# Patient Record
Sex: Female | Born: 1965 | Race: Black or African American | Hispanic: No | Marital: Single | Smoking: Current every day smoker
Health system: Southern US, Community
[De-identification: ages and names within clinical notes are randomized; demographics above are authoritative.]

## PROBLEM LIST (undated history)

## (undated) ENCOUNTER — Emergency Department (HOSPITAL_COMMUNITY): Admission: EM | Payer: Medicare Other | Source: Home / Self Care

## (undated) ENCOUNTER — Inpatient Hospital Stay: Payer: Self-pay

## (undated) DIAGNOSIS — K209 Esophagitis, unspecified without bleeding: Secondary | ICD-10-CM

## (undated) DIAGNOSIS — I499 Cardiac arrhythmia, unspecified: Secondary | ICD-10-CM

## (undated) DIAGNOSIS — K219 Gastro-esophageal reflux disease without esophagitis: Secondary | ICD-10-CM

## (undated) DIAGNOSIS — F17210 Nicotine dependence, cigarettes, uncomplicated: Secondary | ICD-10-CM

## (undated) DIAGNOSIS — K635 Polyp of colon: Secondary | ICD-10-CM

## (undated) DIAGNOSIS — D649 Anemia, unspecified: Secondary | ICD-10-CM

## (undated) DIAGNOSIS — J45909 Unspecified asthma, uncomplicated: Secondary | ICD-10-CM

## (undated) DIAGNOSIS — D1803 Hemangioma of intra-abdominal structures: Secondary | ICD-10-CM

## (undated) DIAGNOSIS — I2510 Atherosclerotic heart disease of native coronary artery without angina pectoris: Secondary | ICD-10-CM

## (undated) DIAGNOSIS — I10 Essential (primary) hypertension: Secondary | ICD-10-CM

## (undated) HISTORY — PX: FINGER SURGERY: SHX640

## (undated) HISTORY — DX: Polyp of colon: K63.5

## (undated) HISTORY — DX: Anemia, unspecified: D64.9

## (undated) HISTORY — PX: TUBAL LIGATION: SHX77

## (undated) HISTORY — PX: ANKLE SURGERY: SHX546

## (undated) HISTORY — PX: TOE OSTEOTOMY: SHX1071

## (undated) HISTORY — PX: TIBIA FRACTURE SURGERY: SHX806

## (undated) HISTORY — DX: Atherosclerotic heart disease of native coronary artery without angina pectoris: I25.10

## (undated) HISTORY — DX: Essential (primary) hypertension: I10

---

## 1998-03-10 ENCOUNTER — Encounter: Admission: RE | Admit: 1998-03-10 | Discharge: 1998-03-10 | Payer: Self-pay | Admitting: Family Medicine

## 1998-03-24 ENCOUNTER — Emergency Department (HOSPITAL_COMMUNITY): Admission: EM | Admit: 1998-03-24 | Discharge: 1998-03-24 | Payer: Self-pay | Admitting: Emergency Medicine

## 1999-01-30 ENCOUNTER — Encounter: Payer: Self-pay | Admitting: Emergency Medicine

## 1999-01-30 ENCOUNTER — Emergency Department (HOSPITAL_COMMUNITY): Admission: EM | Admit: 1999-01-30 | Discharge: 1999-01-30 | Payer: Self-pay | Admitting: Emergency Medicine

## 1999-02-03 ENCOUNTER — Encounter: Admission: RE | Admit: 1999-02-03 | Discharge: 1999-02-03 | Payer: Self-pay | Admitting: Family Medicine

## 1999-03-06 ENCOUNTER — Encounter: Admission: RE | Admit: 1999-03-06 | Discharge: 1999-03-06 | Payer: Self-pay | Admitting: Family Medicine

## 1999-04-11 ENCOUNTER — Encounter: Admission: RE | Admit: 1999-04-11 | Discharge: 1999-04-11 | Payer: Self-pay | Admitting: Sports Medicine

## 1999-06-17 ENCOUNTER — Emergency Department (HOSPITAL_COMMUNITY): Admission: EM | Admit: 1999-06-17 | Discharge: 1999-06-17 | Payer: Self-pay | Admitting: Emergency Medicine

## 1999-06-17 ENCOUNTER — Encounter: Payer: Self-pay | Admitting: Emergency Medicine

## 1999-12-06 ENCOUNTER — Emergency Department (HOSPITAL_COMMUNITY): Admission: EM | Admit: 1999-12-06 | Discharge: 1999-12-06 | Payer: Self-pay | Admitting: Emergency Medicine

## 2000-11-25 ENCOUNTER — Encounter (INDEPENDENT_AMBULATORY_CARE_PROVIDER_SITE_OTHER): Payer: Self-pay | Admitting: *Deleted

## 2000-12-23 ENCOUNTER — Other Ambulatory Visit: Admission: RE | Admit: 2000-12-23 | Discharge: 2000-12-23 | Payer: Self-pay | Admitting: Family Medicine

## 2000-12-23 ENCOUNTER — Encounter: Admission: RE | Admit: 2000-12-23 | Discharge: 2000-12-23 | Payer: Self-pay | Admitting: Family Medicine

## 2001-09-12 ENCOUNTER — Encounter: Admission: RE | Admit: 2001-09-12 | Discharge: 2001-09-12 | Payer: Self-pay | Admitting: Family Medicine

## 2001-09-18 ENCOUNTER — Encounter: Admission: RE | Admit: 2001-09-18 | Discharge: 2001-09-18 | Payer: Self-pay | Admitting: Family Medicine

## 2003-08-03 ENCOUNTER — Emergency Department (HOSPITAL_COMMUNITY): Admission: EM | Admit: 2003-08-03 | Discharge: 2003-08-03 | Payer: Self-pay | Admitting: Emergency Medicine

## 2003-08-05 ENCOUNTER — Encounter: Admission: RE | Admit: 2003-08-05 | Discharge: 2003-08-05 | Payer: Self-pay | Admitting: Family Medicine

## 2003-08-09 ENCOUNTER — Encounter: Admission: RE | Admit: 2003-08-09 | Discharge: 2003-08-09 | Payer: Self-pay | Admitting: *Deleted

## 2003-10-12 ENCOUNTER — Encounter: Admission: RE | Admit: 2003-10-12 | Discharge: 2003-10-12 | Payer: Self-pay | Admitting: Sports Medicine

## 2004-10-05 ENCOUNTER — Ambulatory Visit: Payer: Self-pay | Admitting: Family Medicine

## 2005-12-25 ENCOUNTER — Ambulatory Visit: Payer: Self-pay | Admitting: Family Medicine

## 2005-12-25 ENCOUNTER — Encounter: Admission: RE | Admit: 2005-12-25 | Discharge: 2005-12-25 | Payer: Self-pay | Admitting: Family Medicine

## 2005-12-25 ENCOUNTER — Ambulatory Visit (HOSPITAL_COMMUNITY): Admission: RE | Admit: 2005-12-25 | Discharge: 2005-12-25 | Payer: Self-pay | Admitting: Family Medicine

## 2005-12-27 ENCOUNTER — Encounter: Admission: RE | Admit: 2005-12-27 | Discharge: 2005-12-27 | Payer: Self-pay | Admitting: Family Medicine

## 2006-02-25 ENCOUNTER — Ambulatory Visit: Payer: Self-pay | Admitting: Family Medicine

## 2006-10-24 DIAGNOSIS — D509 Iron deficiency anemia, unspecified: Secondary | ICD-10-CM | POA: Insufficient documentation

## 2006-10-24 DIAGNOSIS — K219 Gastro-esophageal reflux disease without esophagitis: Secondary | ICD-10-CM

## 2006-10-25 ENCOUNTER — Encounter (INDEPENDENT_AMBULATORY_CARE_PROVIDER_SITE_OTHER): Payer: Self-pay | Admitting: *Deleted

## 2007-10-15 ENCOUNTER — Telehealth: Payer: Self-pay | Admitting: *Deleted

## 2007-12-01 ENCOUNTER — Telehealth: Payer: Self-pay | Admitting: *Deleted

## 2007-12-02 ENCOUNTER — Telehealth: Payer: Self-pay | Admitting: *Deleted

## 2008-04-07 ENCOUNTER — Encounter (INDEPENDENT_AMBULATORY_CARE_PROVIDER_SITE_OTHER): Payer: Self-pay | Admitting: Family Medicine

## 2008-04-07 ENCOUNTER — Ambulatory Visit: Payer: Self-pay | Admitting: Family Medicine

## 2008-04-07 DIAGNOSIS — Z6841 Body Mass Index (BMI) 40.0 and over, adult: Secondary | ICD-10-CM | POA: Insufficient documentation

## 2008-04-07 DIAGNOSIS — R1011 Right upper quadrant pain: Secondary | ICD-10-CM

## 2008-04-07 DIAGNOSIS — F17200 Nicotine dependence, unspecified, uncomplicated: Secondary | ICD-10-CM | POA: Insufficient documentation

## 2008-04-07 DIAGNOSIS — D649 Anemia, unspecified: Secondary | ICD-10-CM

## 2008-04-07 DIAGNOSIS — F1010 Alcohol abuse, uncomplicated: Secondary | ICD-10-CM | POA: Insufficient documentation

## 2008-04-12 ENCOUNTER — Encounter (INDEPENDENT_AMBULATORY_CARE_PROVIDER_SITE_OTHER): Payer: Self-pay | Admitting: Family Medicine

## 2008-04-12 LAB — CONVERTED CEMR LAB
AST: 18 units/L (ref 0–37)
Albumin: 4 g/dL (ref 3.5–5.2)
CO2: 19 meq/L (ref 19–32)
Calcium: 9.2 mg/dL (ref 8.4–10.5)
Chloride: 109 meq/L (ref 96–112)
Platelets: 302 10*3/uL (ref 150–400)
Potassium: 4.7 meq/L (ref 3.5–5.3)
RBC: 4.07 M/uL (ref 3.87–5.11)
RDW: 17 % — ABNORMAL HIGH (ref 11.5–15.5)
Total Bilirubin: 0.6 mg/dL (ref 0.3–1.2)
Total Protein: 7.3 g/dL (ref 6.0–8.3)

## 2008-04-26 ENCOUNTER — Encounter (INDEPENDENT_AMBULATORY_CARE_PROVIDER_SITE_OTHER): Payer: Self-pay | Admitting: Family Medicine

## 2008-06-29 ENCOUNTER — Encounter (INDEPENDENT_AMBULATORY_CARE_PROVIDER_SITE_OTHER): Payer: Self-pay | Admitting: Family Medicine

## 2008-06-29 ENCOUNTER — Other Ambulatory Visit: Admission: RE | Admit: 2008-06-29 | Discharge: 2008-06-29 | Payer: Self-pay | Admitting: Family Medicine

## 2008-06-29 ENCOUNTER — Ambulatory Visit: Payer: Self-pay | Admitting: Family Medicine

## 2008-06-29 DIAGNOSIS — R1031 Right lower quadrant pain: Secondary | ICD-10-CM

## 2008-06-29 DIAGNOSIS — N898 Other specified noninflammatory disorders of vagina: Secondary | ICD-10-CM | POA: Insufficient documentation

## 2008-06-29 LAB — CONVERTED CEMR LAB: GC Probe Amp, Genital: NEGATIVE

## 2009-11-17 ENCOUNTER — Emergency Department (HOSPITAL_COMMUNITY): Admission: EM | Admit: 2009-11-17 | Discharge: 2009-11-17 | Payer: Self-pay | Admitting: Emergency Medicine

## 2009-11-17 ENCOUNTER — Telehealth (INDEPENDENT_AMBULATORY_CARE_PROVIDER_SITE_OTHER): Payer: Self-pay | Admitting: *Deleted

## 2009-11-18 ENCOUNTER — Ambulatory Visit: Payer: Self-pay | Admitting: Family Medicine

## 2009-11-18 ENCOUNTER — Encounter: Payer: Self-pay | Admitting: Family Medicine

## 2009-11-21 ENCOUNTER — Encounter: Payer: Self-pay | Admitting: Family Medicine

## 2009-11-21 LAB — CONVERTED CEMR LAB
HCT: 28.7 % — ABNORMAL LOW (ref 36.0–46.0)
Iron: 10 ug/dL — ABNORMAL LOW (ref 42–145)
Platelets: 275 10*3/uL (ref 150–400)
RDW: 16.8 % — ABNORMAL HIGH (ref 11.5–15.5)
TIBC: 445 ug/dL (ref 250–470)
UIBC: 435 ug/dL
Vitamin B-12: 350 pg/mL (ref 211–911)

## 2009-11-22 ENCOUNTER — Telehealth (INDEPENDENT_AMBULATORY_CARE_PROVIDER_SITE_OTHER): Payer: Self-pay | Admitting: *Deleted

## 2010-01-06 ENCOUNTER — Encounter: Payer: Self-pay | Admitting: Family Medicine

## 2010-07-10 ENCOUNTER — Encounter: Payer: Self-pay | Admitting: Family Medicine

## 2010-08-16 ENCOUNTER — Encounter: Payer: Self-pay | Admitting: Family Medicine

## 2010-08-16 ENCOUNTER — Ambulatory Visit: Payer: Self-pay | Admitting: Family Medicine

## 2010-08-16 DIAGNOSIS — T148XXA Other injury of unspecified body region, initial encounter: Secondary | ICD-10-CM | POA: Insufficient documentation

## 2010-08-16 LAB — CONVERTED CEMR LAB
Alkaline Phosphatase: 43 units/L (ref 39–117)
Creatinine, Ser: 0.83 mg/dL (ref 0.40–1.20)
Glucose, Bld: 88 mg/dL (ref 70–99)
HCT: 27.7 % — ABNORMAL LOW (ref 36.0–46.0)
Hemoglobin: 7.7 g/dL — ABNORMAL LOW (ref 12.0–15.0)
INR: 0.97 (ref <1.50)
MCV: 75.5 fL — ABNORMAL LOW (ref 78.0–100.0)
Platelets: 295 10*3/uL (ref 150–400)
Prothrombin Time: 13.1 s (ref 11.6–15.2)
RBC: 3.67 M/uL — ABNORMAL LOW (ref 3.87–5.11)
RDW: 19.9 % — ABNORMAL HIGH (ref 11.5–15.5)
Sodium: 140 meq/L (ref 135–145)
Total Bilirubin: 0.5 mg/dL (ref 0.3–1.2)
Total Protein: 6.8 g/dL (ref 6.0–8.3)
aPTT: 30 s (ref 24–37)

## 2010-08-22 ENCOUNTER — Telehealth: Payer: Self-pay | Admitting: Family Medicine

## 2010-08-23 ENCOUNTER — Encounter: Payer: Self-pay | Admitting: Family Medicine

## 2010-08-24 ENCOUNTER — Telehealth: Payer: Self-pay | Admitting: *Deleted

## 2010-09-26 NOTE — Miscellaneous (Signed)
   Clinical Lists Changes  Problems: Removed problem of SEBACEOUS CYST-INFLAMMED (ICD-706.2)

## 2010-09-26 NOTE — Miscellaneous (Signed)
Summary: Procedure Consent  Procedure Consent   Imported By: Bradly Bienenstock 12/01/2009 12:04:41  _____________________________________________________________________  External Attachment:    Type:   Image     Comment:   External Document

## 2010-09-26 NOTE — Letter (Signed)
Summary: Results Follow-up Letter  [facility]  [address]   [place], [place] [address]   Phone: [phone]  Fax: [phone]    11/21/2009  [address] [place], [place]  [address]  Dear Ms. PERRIER,   The following are the results of your recent test(s):   Your test results are consistent with iron-deficiency anemia (low blood iron), whihc you have had in the past as well. Your vitamin B12 and folate were normal. You should continue to take your iron pills twice a day. I have refilled these pills for you at the [facility] on [address]. You should call for an appointment for a complete physical within the next 6 months as well. Please call if any questions.  Sincerely,  Bobby Rumpf  MD [facility]            Appended Document: Results Follow-up Letter mailed.

## 2010-09-26 NOTE — Assessment & Plan Note (Signed)
Summary: cyst on ear/eo   Vital Signs:  Patient profile:   45 year old female Weight:      252.6 pounds Temp:     97.7 degrees F oral Pulse rate:   98 / minute BP sitting:   118 / 77  (left arm) Cuff size:   regular  Vitals Entered By: Garen Grams LPN (November 18, 2009 9:23 AM) CC: cyst on face Is Patient Diabetic? No Pain Assessment Patient in pain? no        CC:  cyst on face.  History of Present Illness:       This is a 45 year old woman who presents with inclusion cyst.  The patient denies ulceration, non-healing, bleeding, numbness, and pain.  Affected areas include the face.  The lesion is rapid growth.  She is not diabetic.  It began getting larger 5 days ago.  It is tender and red and warm.         The patient complains of repeat healing and ulceration x 3.    Habits & Providers  Alcohol-Tobacco-Diet     Alcohol drinks/day: 3.5     Alcohol Counseling: to decrease amount and/or frequency of alcohol intake     Alcohol type: beer     >5/day in last 3 mos: yes     Feels need to cut down: no     Feels annoyed by complaints: no     Feels guilty re: drinking: no     Needs 'eye opener' in am: no     Tobacco Status: current     Cigarette Packs/Day: 1.0  Comments: Drinks one 40 oz beer daily, does not work, has not had DUI  Current Problems (verified): 1)  Routine Gynecological Examination  (ICD-V72.31) 2)  Vaginal Discharge  (ICD-623.5) 3)  Rlq Pain  (ICD-789.03) 4)  Morbid Obesity  (ICD-278.01) 5)  Alcohol Abuse  (ICD-305.00) 6)  Tobacco Abuse  (ICD-305.1) 7)  Anemia, Hx of  (ICD-V12.3) 8)  Abdominal Pain Right Upper Quadrant  (ICD-789.01) 9)  Gastroesophageal Reflux, No Esophagitis  (ICD-530.81) 10)  Anemia, Iron Deficiency, Unspec.  (ICD-280.9)  Current Medications (verified): 1)  Ferrous Sulfate 324 Mg  Tbec (Ferrous Sulfate) .... One By Mouth Two Times A Day 2)  Pre-Natal Formula   Tabs (Prenatal Multivit-Min-Fe-Fa) .... One By Mouth Daily  Allergies  (verified): No Known Drug Allergies  Past History:  Past Medical History: Last updated: 04/07/2008 1 LTCS 1989, 2 SVD Ferritin - 3 on 2/06, Hgb-9.3 on 2/06, MCV-75, iron-10, TIBC-453, %sat-2 Lacerated tendons R hand 2nd & 4th fingers 1/03 s/p L ankle fracture  Past Surgical History: Last updated: 10/24/2006 s/p BTL 1993 - 12/23/2000  Family History: Last updated: 10/24/2006 Father 38 yo - anemia, GI bleed, Mother 80 yo - DM II, HTN, No family hx of colon CA, sister - good health  Social History: Last updated: 04/07/2008 Lives with 3 sons, 31, 76 and 69 yrs old; full-time homemaker; previously worked in [employer], 12th grade education; etoh - 40oz/day; smoked tobacco for 20 years now 1/2ppd.  Disabled secondary to ankle fracture in past  Risk Factors: Alcohol Use: 3.5 (11/18/2009) >5 drinks/d w/in last 3 months: yes (11/18/2009)  Risk Factors: Smoking Status: current (11/18/2009) Packs/Day: 1.0 (11/18/2009)  Social History: Packs/Day:  1.0  Review of Systems       Reports hematemesis occasionally.  Found to have hgb of 8.5 in ED this week.  Physical Exam  General:  alert and overweight-appearing.   Head:  normocephalic and atraumatic.   Ears:  Rt. ear/face-just in front of right ear is erythematous fluctuant 2.5 cm mass consistent with cyst. Additional Exam:  I and D after informed consent, under sterile conditions, with local anesthesia (with epinephrine), small incision made with 11 blade.  moderate amount of pus drained.  Pt. tolerated well, bandage placed.   Impression & Recommendations:  Problem # 1:  SEBACEOUS CYST-INFLAMMED (ICD-706.2)  s/p I and D  Orders: I&D Abscess, Simple / Single (10060) [facility]- Est Level  3 (56213) Dermatology Referral (Derma)-for definitive treatment  Problem # 2:  ANEMIA (ICD-285.9)  Her updated medication list for this problem includes:    Ferrous Sulfate 324 Mg Tbec (Ferrous sulfate) ..... One by mouth two times a  day  Orders: [facility]- Est Level  3 (99213) B12-[facility] ([id number]) CBC-[facility] (29528) Folate-[facility] [phone]) Ferritin-[facility] [phone]) Iron -[facility] (47425-95638)  Complete Medication List: 1)  Ferrous Sulfate 324 Mg Tbec (Ferrous sulfate) .... One by mouth two times a day 2)  Pre-natal Formula Tabs (Prenatal multivit-min-fe-fa) .... One by mouth daily 3)  Keflex 500 Mg Caps (Cephalexin) .Marland Kitchen.. 1 by mouth three times a day x 7 days  Patient Instructions: 1)  Please schedule a follow-up appointment as needed .  Prescriptions: KEFLEX 500 MG CAPS (CEPHALEXIN) 1 by mouth three times a day x 7 days  #21 x 0   Entered and Authorized by:   Tinnie Gens MD   Signed by:   Tinnie Gens MD on 11/18/2009   Method used:   Electronically to        [facility] (retail)       [address]       [place], [place]  [id number]       Ph: [phone]       Fax: [phone]   RxID:   [id number]

## 2010-09-26 NOTE — Consult Note (Signed)
Summary: [facility]   Imported By: De Nurse 01/25/2010 15:57:05  _____________________________________________________________________  External Attachment:    Type:   Image     Comment:   External Document  Appended Document: Roxan Hockey Reviewed.  H/O EIC. Scar treated (right cheek) w/ intralesional triamcinolone, follow up 4 weeks.

## 2010-09-26 NOTE — Progress Notes (Signed)
Summary: phn msg   Phone Note Call from Patient Call back at Home Phone [phone]   Caller: Patient Summary of Call: Pt returning call from today.  Not sure who called her. Initial call taken by: Clydell Hakim,  November 22, 2009 3:08 PM  Follow-up for Phone Call        called patient about derm  appointment that I had scheduled but she already has an appointment scheduled for 12/30/2009 with Dr.Lupton.  Follow-up by: Theresia Lo RN,  November 22, 2009 4:06 PM

## 2010-09-26 NOTE — Progress Notes (Signed)
Summary: referral   Phone Note Call from Patient Call back at Home Phone [phone]   Caller: Patient Summary of Call: pt went to [facility] ED and has a cyst on ear- needs a referral to have removed Initial call taken by: De Nurse,  November 17, 2009 3:15 PM  Follow-up for Phone Call        patient states she went to ED today for cyst on ear, actually she states it is next to ear lobe on face.  at the ED  they gave her the name of [facility] and phone numbber to call for appointment. she tried to make appointment but was told since area is on her face she will need to call a Engineer, petroleum. she states the area is size of 50 cent piece. very painful. she is unable to come to office today due to  no transportation.  appointment scheduled tomorow. she states she was at the ED this AM around 8:30. states area has gotten bigger as day has gone on. advised patient when she can get a ride and if she feels she can't make it until the AM she should go back to the ED or urgent care. Follow-up by: Theresia Lo RN,  November 17, 2009 3:42 PM

## 2010-09-28 NOTE — Assessment & Plan Note (Signed)
Summary: bruises,df   Vital Signs:  Patient profile:   45 year old female Weight:      245.1 pounds Temp:     98.2 degrees F oral Pulse rate:   87 / minute BP sitting:   118 / 64  (left arm) Cuff size:   regular  Vitals Entered By: Garen Grams LPN (August 16, 2010 1:38 PM) CC: bruising easily Is Patient Diabetic? No Pain Assessment Patient in pain? no        Primary Care Provider:  Bobby Rumpf  MD  CC:  bruising easily.  History of Present Illness: 1) Bruising: Reports easy bruising for at least the past year, mainly lower extremities but also upper extremities (but not trunk or face) at times as well. Reports alcohol abuse as below. LMP was 07/29/10 - periods are usually heavy. Denies specific trauma, gum bleeding, nasal bleeding, melena, hematochezia, joint pain, rash, petechiae. Has never had surgery; no family history of bleeding disorder.   2) Alcohol abuse: Reports current alcohol consumption of one 40 oz beer 2-3 times per week - up to one month ago patient was drinking four to five 40 oz beers per day. Started cutting back because she noticed the bruising on her legs. CAGE = 0/4. Denies abdominal pain, seizures, nausea, emesis, neuropathy.   3) Tobacco abuse: 1/4 pack per day. Precontemplative about quitting all the way - has cut back  4) Obesity: Sedentary. Weight 245 lbs today. Was 252 at last visit in March 2011.     Habits & Providers  Alcohol-Tobacco-Diet     Alcohol drinks/day: 3.5     Alcohol Counseling: to decrease amount and/or frequency of alcohol intake     Alcohol type: beer     >5/day in last 3 mos: yes     Feels need to cut down: no     Feels annoyed by complaints: no     Feels guilty re: drinking: no     Needs 'eye opener' in am: no     Tobacco Status: current     Tobacco Counseling: to quit use of tobacco products     Cigarette Packs/Day: <0.25  Current Medications (verified): 1)  Ferrous Sulfate 324 Mg  Tbec (Ferrous Sulfate) .... One By  Mouth Two Times A Day  Allergies (verified): No Known Drug Allergies  Social History: Packs/Day:  <0.25  Physical Exam  General:  obese, NAD, vitals reviewed  Eyes:  no jaundice  Mouth:  no oral petechiae  Neck:  no lymphadenopathy   Heart:  normal rate, regular rhythm, and no murmur.   Abdomen:  soft, non-tender, normal bowel sounds, no masses, no guarding, and no rigidity - abdominal obesity makes for difficult exam for palpation of liver size. No ascites noted.   Msk:  normal ROM, no joint tenderness, and no joint swelling.   Extremities:  trace bilateral LE edema  Neurologic:  alert & oriented X3.   Skin:  - chronic venostasis changes anterior lower legs - ecchymoses at anterior lower right leg, anterior lower left leg, right greater trochanter in various stages of resolution - no jaundice    Impression & Recommendations:  Problem # 1:  BRUISE (ICD-924.9) Assessment New Easy bruising - given location of areas of bruising and alcohol abuse history suspect likely secondary to trauma. Given chronic alcohol abuse however will check CBC and coags. No family history of bleeding disorder. No red flags on history or exam for more serious cause at this time. Will follow. See  assessment of EtOH abuse as below.  Orders: Comp Met-[facility] [phone]) CBC-[facility] (09811) PTT-[facility] ([id number]) INR/PT-[facility] (13086) [facility]- Est  Level 4 (57846)  Problem # 2:  MORBID OBESITY (ICD-278.01) Assessment: Unchanged  Sedentary. Counseled on importance of weight loss w/ diet and exercise. Follow up at next appointment.   Orders: [facility]- Est  Level 4 ([address])  Problem # 3:  ALCOHOL ABUSE (ICD-305.00) Assessment: Unchanged  CAGE negative. Cutting back (secondary to bruising). No history of delirium tremens or withdrawal seizure. Advised to cut back further - patient contemplative.   Orders: [facility]- Est  Level 4 (28413)  Problem # 4:  TOBACCO ABUSE (ICD-305.1) Assessment: Unchanged  Precontemplative.  Will follow readiness to quit at next appointment.   Orders: [facility]- Est  Level 4 (24401)  Complete Medication List: 1)  Ferrous Sulfate 324 Mg Tbec (Ferrous sulfate) .... One by mouth two times a day  Patient Instructions: 1)  Follow up in three months to see how you are doing with alcohol. 2)  Continue to cut back on your alcohol use. 3)  Continue to cut down on your tobacco use 4)  We will check some lab work today   Orders Added: 1)  Comp Met-[facility] [[id number]] 2)  CBC-[facility] [85027] 3)  PTT-[facility] [[id number]] 4)  INR/PT-[facility] [85610] 5)  [facility]- Est  Level 4 [38756]

## 2010-09-28 NOTE — Progress Notes (Signed)
Summary: results   Phone Note Call from Patient Call back at Home Phone [phone]   Caller: Patient Summary of Call: pt is asking for results of labs Initial call taken by: De Nurse,  August 22, 2010 3:35 PM  Follow-up for Phone Call        Letter written to be sent. Labs consistent with prior history of anemia. Patient needs to take her iron tabs. Follow-up by: Bobby Rumpf  MD,  August 23, 2010 3:30 PM    Prescriptions: FERROUS SULFATE 324 MG  TBEC (FERROUS SULFATE) one by mouth two times a day  #60 x 6   Entered and Authorized by:   Bobby Rumpf  MD   Signed by:   Bobby Rumpf  MD on 08/23/2010   Method used:   Electronically to        [facility] (retail)       [address]       [place], [place]  [id number]       Ph: [phone]       Fax: [phone]   RxID:   [id number]

## 2010-09-28 NOTE — Letter (Signed)
Summary: Results Follow-up Letter  [facility]  [address]   [place], [place] [address]   Phone: [phone]  Fax: [phone]    08/23/2010  [address] [place], [place]  [address]  Dear Ms. MURLEY,   Your lab tests show that your hemoglobin is low (you have anemia) as it has been in the past. You need to take your iron supplement twice a day as instructed before. Follow up with me as scheduled.  Sincerely,  Bobby Rumpf  MD [facility]           Appended Document: Results Follow-up Letter mailed

## 2010-09-28 NOTE — Progress Notes (Signed)
Summary: Results   Phone Note Call from Patient Call back at Home Phone [phone]   Reason for Call: Talk to Nurse Summary of Call: pt calling again about test results, advised pt of MD's message, pt said she was told she had liver damage & is having pain in her side, wants to know what she should do about it? Initial call taken by: Knox Royalty,  August 24, 2010 11:01 AM  Follow-up for Phone Call        Informed patient that she would need to call and schedule an appt since she wasnt colpmaining of pain at last visit, patient expressed understanding. Follow-up by: Garen Grams LPN,  August 24, 2010 11:33 AM

## 2010-10-12 ENCOUNTER — Encounter: Payer: Self-pay | Admitting: Family Medicine

## 2010-10-12 ENCOUNTER — Ambulatory Visit (INDEPENDENT_AMBULATORY_CARE_PROVIDER_SITE_OTHER): Payer: Medicaid Other | Admitting: Family Medicine

## 2010-10-12 VITALS — BP 122/80 | HR 64 | Temp 97.8°F | Ht 64.5 in | Wt 247.0 lb

## 2010-10-12 DIAGNOSIS — L720 Epidermal cyst: Secondary | ICD-10-CM | POA: Insufficient documentation

## 2010-10-12 DIAGNOSIS — IMO0002 Reserved for concepts with insufficient information to code with codable children: Secondary | ICD-10-CM

## 2010-10-12 DIAGNOSIS — L723 Sebaceous cyst: Secondary | ICD-10-CM

## 2010-10-12 NOTE — Assessment & Plan Note (Signed)
Feel this is liquid cyst due to fluctuance on exam.   Patient has had this drained multiple times in past, will refer back to Derm as detailed in HPI for more definite drainage and hopefully excision of capsule.  Gave warnings regarding signs/symptoms of infection or worsening in size.   Does not affect ear anatomy or function, so no need for ENT referral.   To fu with [place] after she sees Derm.

## 2010-10-12 NOTE — Progress Notes (Signed)
  Subjective:    Patient ID: Kendra [name], female    DOB: 1965/09/10, 45 y.o.   MRN: [id number]  HPI 1.  Swelling on face:  Patient with 2 cm mass located pre-tragus on Right side of face.  Has had this drained multiple times in past by physicians here at [facility].  Was referred to Dermatology and seen, treated with unknown medication.  Told that if swelling recurs she will need further surgery performed by Dermatologist.  States that it began to increase it swelling again for past few weeks, came back to be evaluated and is asking for referral.  No redness, drainage, fevers, chills, abdominal pain, N/V.  Does endorse some itching but no actual pain at site.  Has not tried anything for the itching or to make it go away.     Review of Systems See HPI above for review of systems.       Objective:   Physical Exam Gen:  Alert, cooperative patient who appears stated age in no acute distress.  Vital signs reviewed. HEENT:  PERRL.  EOMI.  Tympanic membranes clear BL.  No distortion of ear canal.   Neck:  Supple, no cervical adenopathy noted Skin:  1.5 x 2 cm fluctuant cyst located beneath skin 4 mm from RIght tragus directly below hairline.  Raised about 0.5 cm from surface of skin.  No tenderness to palpation.  No warmth or erythema noted.  No pus visible below skin.         Assessment & Plan:

## 2010-10-12 NOTE — Patient Instructions (Signed)
We will refer you back to [facility].  In the meantime, if you start having any increasing pain, warmth, redness, or fevers come back and see [place]. It was good to see you today.

## 2010-11-17 LAB — CBC
Hemoglobin: 8.5 g/dL — ABNORMAL LOW (ref 12.0–15.0)
MCV: 75.8 fL — ABNORMAL LOW (ref 78.0–100.0)
RBC: 3.56 MIL/uL — ABNORMAL LOW (ref 3.87–5.11)
WBC: 6.9 10*3/uL (ref 4.0–10.5)

## 2010-11-17 LAB — BASIC METABOLIC PANEL
CO2: 23 mEq/L (ref 19–32)
Calcium: 8.9 mg/dL (ref 8.4–10.5)
Creatinine, Ser: 0.81 mg/dL (ref 0.4–1.2)
GFR calc Af Amer: >60 mL/min (ref >60)
GFR calc non Af Amer: >60 mL/min (ref >60)

## 2010-11-17 LAB — D-DIMER, QUANTITATIVE: D-Dimer, Quant: <0.22 ug/mL-FEU (ref 0.00–0.48)

## 2010-11-17 LAB — DIFFERENTIAL
Eosinophils Relative: 1 % (ref 0–5)
Lymphocytes Relative: 34 % (ref 12–46)
Lymphs Abs: 2.3 10*3/uL (ref 0.7–4.0)
Monocytes Absolute: 0.5 10*3/uL (ref 0.1–1.0)
Monocytes Relative: 8 % (ref 3–12)
Neutro Abs: 3.9 10*3/uL (ref 1.7–7.7)

## 2010-11-17 LAB — POCT CARDIAC MARKERS
CKMB, poc: <1 ng/mL — ABNORMAL LOW (ref 1.0–8.0)
Troponin i, poc: 0.05 ng/mL (ref 0.00–0.09)

## 2010-12-09 ENCOUNTER — Emergency Department (HOSPITAL_COMMUNITY)
Admission: EM | Admit: 2010-12-09 | Discharge: 2010-12-09 | Disposition: A | Discharge status: Home / Self Care | Payer: Medicaid Other | Attending: Emergency Medicine | Admitting: Emergency Medicine

## 2010-12-09 DIAGNOSIS — M79609 Pain in unspecified limb: Secondary | ICD-10-CM | POA: Insufficient documentation

## 2010-12-09 DIAGNOSIS — M545 Low back pain, unspecified: Secondary | ICD-10-CM | POA: Insufficient documentation

## 2010-12-09 DIAGNOSIS — M5430 Sciatica, unspecified side: Secondary | ICD-10-CM | POA: Insufficient documentation

## 2010-12-09 DIAGNOSIS — M533 Sacrococcygeal disorders, not elsewhere classified: Secondary | ICD-10-CM | POA: Insufficient documentation

## 2010-12-09 DIAGNOSIS — M25559 Pain in unspecified hip: Secondary | ICD-10-CM | POA: Insufficient documentation

## 2010-12-20 ENCOUNTER — Encounter: Payer: Self-pay | Admitting: Family Medicine

## 2010-12-20 ENCOUNTER — Ambulatory Visit (INDEPENDENT_AMBULATORY_CARE_PROVIDER_SITE_OTHER): Payer: Medicaid Other | Admitting: Family Medicine

## 2010-12-20 VITALS — BP 129/82 | HR 90 | Temp 97.7°F | Wt 236.7 lb

## 2010-12-20 DIAGNOSIS — D509 Iron deficiency anemia, unspecified: Secondary | ICD-10-CM

## 2010-12-20 DIAGNOSIS — M545 Low back pain: Secondary | ICD-10-CM

## 2010-12-20 DIAGNOSIS — G8929 Other chronic pain: Secondary | ICD-10-CM | POA: Insufficient documentation

## 2010-12-20 LAB — POCT HEMOGLOBIN: Hemoglobin: 7.7

## 2010-12-20 MED ORDER — IBUPROFEN 800 MG PO TABS: 800.0000 mg | ORAL_TABLET | Freq: Three times a day (TID) | ORAL | Status: AC | PRN

## 2010-12-20 NOTE — Patient Instructions (Signed)
Follow up in 6 weeks.  Take ibuprofen for pain Do the back exercises as we discussed If your pain becomes worse or you start having weakness, numbness or difficulty with urination or with constipation, please give [place] a call

## 2010-12-21 NOTE — Progress Notes (Signed)
  Subjective:    Patient ID: Kendra [name], female    DOB: 01/02/1966, 45 y.o.   MRN: [id number]  HPI  1) Low back pain: x 7 years. Worse over past 8 months. Radiating down posterior right leg (to level of knee) from right lumbar area. Feels "sore". Reports some occasional right foot numbness as well. Worse with standing. Denies trauma, fever, chills, weight loss, night sweats, bowel or bladder issues, weakness, saddle anesthesia. Seen in the ER on 12/09/10 and given Valium, hydrocodone / APAP, prednisone taper after diagnosis of sciatica. Pain has improved slightly since that ER visit. Does not exercise.   Pertinent past history reviewed   Review of Systems As per HPI     Objective:   Physical Exam General: obese, pleasant, NAD  Musculoskeletal:  Decreased ROM at lumbar spine with forward flexion, full ROM with rotation and extension and lateral bending; +ve FABER on right, +ve SLR on right, 5/5 strength with hip flexion and abduction bilaterally. Pain with palpation over para-spinous muscles at lumbar right > left. Normal patellar reflexes. Sensation intact bilateral lower extremities.       Assessment & Plan:

## 2010-12-21 NOTE — Assessment & Plan Note (Signed)
Likely sciatica without red flag symptoms. Back exercises given. Weight loss encouraged. Ibuprofen for pain. Heat / ice as needed. Follow up six weeks. Red flags reviewed and handout given.

## 2011-03-26 ENCOUNTER — Encounter: Payer: Self-pay | Admitting: Family Medicine

## 2011-03-26 ENCOUNTER — Ambulatory Visit (INDEPENDENT_AMBULATORY_CARE_PROVIDER_SITE_OTHER): Payer: Medicaid Other | Admitting: Family Medicine

## 2011-03-26 DIAGNOSIS — D509 Iron deficiency anemia, unspecified: Secondary | ICD-10-CM

## 2011-03-26 DIAGNOSIS — M545 Low back pain: Secondary | ICD-10-CM

## 2011-03-26 DIAGNOSIS — R209 Unspecified disturbances of skin sensation: Secondary | ICD-10-CM

## 2011-03-26 DIAGNOSIS — G8929 Other chronic pain: Secondary | ICD-10-CM

## 2011-03-26 DIAGNOSIS — M7061 Trochanteric bursitis, right hip: Secondary | ICD-10-CM | POA: Insufficient documentation

## 2011-03-26 DIAGNOSIS — R200 Anesthesia of skin: Secondary | ICD-10-CM

## 2011-03-26 DIAGNOSIS — M79609 Pain in unspecified limb: Secondary | ICD-10-CM

## 2011-03-26 DIAGNOSIS — M79651 Pain in right thigh: Secondary | ICD-10-CM

## 2011-03-26 MED ORDER — GABAPENTIN 100 MG PO TABS: 100.0000 mg | ORAL_TABLET | Freq: Every day | ORAL | Status: DC

## 2011-03-26 NOTE — Assessment & Plan Note (Addendum)
Chronic lower back pain likely musculoskeletal in nature. No sign of sciatic nerve entrapment. Will start Neurontin 100 Qhs allowing for increase to BID if necessary. Referring to PT.

## 2011-03-26 NOTE — Assessment & Plan Note (Signed)
Increase iron supplementation to TID as tolerated. Counseled to start OTC Vitamin C supplementation to assist with iron absorption. Follow-up in 1 month with CBC

## 2011-03-26 NOTE — Patient Instructions (Signed)
Your back and leg pain are likely musculoskeletal in nature. I would like for you to try physical therapy to see if you can't get some pain relief.  I would also like for you to start Neurontin. This medicine should help with your back and hip pain, as well as the numbness you are experiencing in your fingers and toes.   Please increase your iron intake to three times a day, and please start taking a vitamin C supplement.  I would like to see you back in 1 month to follow up with your pain and anemia.

## 2011-03-26 NOTE — Progress Notes (Signed)
  Subjective:    Patient ID: Kendra [name], female    DOB: 06-07-1966, 45 y.o.   MRN: [id number]  HPI Lower back pain - pain is a constant achy pain at a 9/10 in nature and has been constant for since the birth of her son 19 years ago. Pain is non-radiating. No traumatic history. Little relief with 800mg  Ibuprofen. She spends a lot of time on her feet at work but the pain does not limit her at work. 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 Tried recommended back exercises for three weeks without relief.    Thigh pain - pain persistent for the past 3 weeks since getting up from the toilet. Pt states she felt her "bones pop" when she stood up. Pain is localized to anterior and lateral thigh. Pain is a 9/10 in nature, but has not limited her ability to work.   Numbness in hands and toes - Numbness for the past 2 weeks. Worse at night, and is relieved by rubbing the effected digits. No history of diabetes. Does not take a multivitamin. Normal diet and minimal alcohol use. Mother with DM  Anemia - taking Ferrous sulfate 324mg  BID. Periods every 28 days w/ 2 days of bleeding. She goes through ~18 pads in those two days.   Review of Systems Denies fever, CP, SOB, weight loss, hematemesis, Hematochezia, melena, constipation, diarrhea,  Change in vision or hearing, syncope, HA, gait instability    Objective:   Physical Exam General - Well developed, obese HEENT - RTM and LTM normal, sclera white, extraocular movement normal, mmm dental carries, no cervical adenopathy, normal thyroid CV - RRR, no M/R/G Resp - CTAB, normal effort Abd - NABS, soft non-tender Musculoskeletal - ROM normal in both legs Skin - intact, warm and well perfused  Neuro - normal cerebellar function, non-radiating pain on palpation of lower back in the perispinal suprasacral region, sensation normal in fingers and toes      Assessment & Plan:   Numbness and tingling Numbness in hands and toes for past two  weeks. Etiology unknown given normal history, glucose and Vit B12 in the past, and normal exam today. If symptoms worsen will likely redraw labs. Neurontin for back pain will likely help.   Chronic low back pain Chronic lower back pain likely musculoskeletal in nature. No sign of sciatic nerve entrapment. Will start Neurontin 100 Qhs allowing for increase to BID if necessary. Referring to PT.   Right thigh pain Meralgia parasthetica. Will likely self resolve. Will follow-up in 1 month.   ANEMIA, IRON DEFICIENCY, UNSPEC. Increase iron supplementation to TID as tolerated. Counseled to start OTC Vitamin C supplementation to assist with iron absorption. Follow-up in 1 month with CBC

## 2011-03-26 NOTE — Assessment & Plan Note (Addendum)
Numbness in hands and toes for past two weeks. Etiology unknown given normal history, glucose and Vit B12 in the past, and normal exam today. If symptoms worsen will likely redraw labs. Neurontin for back pain will likely help.

## 2011-03-26 NOTE — Assessment & Plan Note (Signed)
Meralgia parasthetica. Will likely self resolve. Will follow-up in 1 month.

## 2011-03-27 ENCOUNTER — Encounter: Payer: Self-pay | Admitting: Family Medicine

## 2011-03-27 NOTE — Progress Notes (Signed)
Addended by: Jone Baseman D on: 03/27/2011 03:46 PM   Modules accepted: Orders

## 2011-04-13 ENCOUNTER — Encounter: Payer: Medicaid Other | Admitting: Family Medicine

## 2011-05-31 ENCOUNTER — Encounter: Payer: Self-pay | Admitting: Family Medicine

## 2011-05-31 ENCOUNTER — Ambulatory Visit (INDEPENDENT_AMBULATORY_CARE_PROVIDER_SITE_OTHER): Payer: Medicaid Other | Admitting: Family Medicine

## 2011-05-31 ENCOUNTER — Other Ambulatory Visit (HOSPITAL_COMMUNITY)
Admission: RE | Admit: 2011-05-31 | Discharge: 2011-05-31 | Disposition: A | Discharge status: Home / Self Care | Payer: Medicare Other | Source: Ambulatory Visit | Attending: Family Medicine | Admitting: Family Medicine

## 2011-05-31 VITALS — BP 113/73 | HR 92 | Temp 97.8°F | Wt 237.0 lb

## 2011-05-31 DIAGNOSIS — N938 Other specified abnormal uterine and vaginal bleeding: Secondary | ICD-10-CM

## 2011-05-31 DIAGNOSIS — Z23 Encounter for immunization: Secondary | ICD-10-CM

## 2011-05-31 DIAGNOSIS — Z01419 Encounter for gynecological examination (general) (routine) without abnormal findings: Secondary | ICD-10-CM | POA: Insufficient documentation

## 2011-05-31 DIAGNOSIS — Z124 Encounter for screening for malignant neoplasm of cervix: Secondary | ICD-10-CM

## 2011-05-31 DIAGNOSIS — IMO0002 Reserved for concepts with insufficient information to code with codable children: Secondary | ICD-10-CM

## 2011-05-31 DIAGNOSIS — M79651 Pain in right thigh: Secondary | ICD-10-CM

## 2011-05-31 DIAGNOSIS — L723 Sebaceous cyst: Secondary | ICD-10-CM

## 2011-05-31 DIAGNOSIS — N949 Unspecified condition associated with female genital organs and menstrual cycle: Secondary | ICD-10-CM

## 2011-05-31 DIAGNOSIS — M79609 Pain in unspecified limb: Secondary | ICD-10-CM

## 2011-05-31 DIAGNOSIS — F17200 Nicotine dependence, unspecified, uncomplicated: Secondary | ICD-10-CM

## 2011-05-31 MED ORDER — GABAPENTIN (ONCE-DAILY) 300 MG PO TABS: 300.0000 mg | ORAL_TABLET | Freq: Every day | ORAL | Status: DC

## 2011-05-31 NOTE — Patient Instructions (Addendum)
Thank you for coming into the office today for a check up. I think your thigh pain may be due to a severe muscle strain or small tear. I would like for you to go get an x-ray of the leg to evaluate for any bone involvement, but I think this will be normal. Please also start taking the neurontin 300mg  at night to see if you can get a little better relief. Please also feel free to increase how much ibuprofen and tylenol you are taking during the day. You can take up to 800mg  of ibuprofen 4 times a day or 500-1000mg  of tylenol 4 times a day. I would start taking lower doses of these medications and work up to the doses indicated.  I would also like for you to schedule another appointment for an endometrial biopsy to evaluate your irregular bleeding.  I will let you know at your next appointment if anything comes back abnormal with your pap smear

## 2011-05-31 NOTE — Assessment & Plan Note (Addendum)
Pt reports needing another referral for removal or R preauricular cyst. Had appointment set w/ surgery group in [place] but lost her referral. Has had cyst drained in the past, but needs excision for complete resolution of condition.

## 2011-05-31 NOTE — Assessment & Plan Note (Signed)
Hormonal changes associated w/ menopause vs malignancy vs endocrinopathy. Pap smear today. Will schedule for f/u endometrial bx. No h/o endocrine abnormality (normal thyroid, no vision change, no galactorrhea).

## 2011-05-31 NOTE — Progress Notes (Signed)
  Subjective:    Patient ID: Kendra [name], female    DOB: 1965-09-14, 45 y.o.   MRN: [id number]  HPI R thigh pain: continues to have fairly constant pain in the R thigh which started in April when pt got up off the toilet and felt a "pop". No improvement since last visit. No improvement w/ Neurontin 100mg  Qhs. Pain is worse at night when the pt is lying down. Pt gets some relief from taking 200mg  of NSAID or 500mg  Tylenol. Pain does not worsen over the course of the day. Minimal transient numbness reported. Pain is non-radiating. No change in strength in the effected leg.  Irregular bleeding: Pt reports irregular bleeding for the past 6months of 1 day of light spotting during the day every 1-2 weeks between her periods. Periods are still regular at every 28-30 days w/ 4 days of bleeding. Menses started at age 34. Mother whent through menapause at age 73yo. No h/o reproductive organ cancer in the family. Pt underwhent BTL several years ago, so she doesn't take any hormonally based contraception.   Tobacco cessation: smokes 1/4-1 ppd. Smoker for past 10 years. Is considering quitting but feels like she needs to by them when she sees them.   Review of Systems Negative: HA, CP, SOB, N/V/D, syncope, change in vision, or sensation Positive: constipation (1-2BM weekly),      Objective:   Physical Exam  Gen: NAD, Obese, alert HEENT: no cervical lymphadenopathy, TM normal Bilat., oropharynx clear, numerous cavities, nasal turbinates normal, PERRL, EOMI, R preauricular mobile mass approximately dime sized CV: RRR, I-II/VI systolic murmur Res: CTAB, normal effort Abd: NABS, Stool present, non-tender GU: normal premenopausal appearing vaginal wall and cervix, no adnexal masses or tenderness Ext/Musc: normal nonpainful ROM, 2+ distal pulses, 4+ strength Bilat. In upper and lower extremities. No pain on deep palpation of R hip, knee and thigh.  Skin: intact, no rashes Neuro: CN grossly intact       Assessment & Plan:

## 2011-05-31 NOTE — Assessment & Plan Note (Signed)
Persistent R thigh pain. Likely musculoskeletal in nature due to hx. Muscle strain/avulsion/tear vs skeletal fracture. Due to hx, obesity, and PE will order R femur film to r/o skeletal fracture or other pathology. Will increase gabapentin to 300Qhs for pm relief. Pt instructed to increase NSAIDs or tylenol during the day and given appropriate parameters.

## 2011-05-31 NOTE — Assessment & Plan Note (Signed)
Pt not interested in making a concerted effort to quit at this time. Will address again at next appointment.

## 2011-06-01 ENCOUNTER — Telehealth: Payer: Self-pay | Admitting: *Deleted

## 2011-06-01 DIAGNOSIS — IMO0002 Reserved for concepts with insufficient information to code with codable children: Secondary | ICD-10-CM

## 2011-06-01 NOTE — Telephone Encounter (Signed)
Pt told MD she needed a referral to a Surgeon in [place].  I am not aware of a surgeon in [place].  LMOVM for pt to call back with additional info Fle[place], [employer]

## 2011-06-04 NOTE — Telephone Encounter (Signed)
Pt states that it is for a dermatologist on [place] (Dr. Terri Piedra).  Will need a different referral placed for Derm.  Back to MD Fleeger, Maryjo [place]

## 2011-06-14 ENCOUNTER — Other Ambulatory Visit: Payer: Self-pay | Admitting: Family Medicine

## 2011-06-14 ENCOUNTER — Encounter: Payer: Self-pay | Admitting: Family Medicine

## 2011-06-14 ENCOUNTER — Ambulatory Visit (INDEPENDENT_AMBULATORY_CARE_PROVIDER_SITE_OTHER): Payer: Medicare Other | Admitting: Family Medicine

## 2011-06-14 VITALS — BP 131/67 | HR 95 | Temp 98.3°F | Ht 64.0 in | Wt 233.0 lb

## 2011-06-14 DIAGNOSIS — M545 Low back pain, unspecified: Secondary | ICD-10-CM

## 2011-06-14 DIAGNOSIS — N949 Unspecified condition associated with female genital organs and menstrual cycle: Secondary | ICD-10-CM

## 2011-06-14 DIAGNOSIS — F17200 Nicotine dependence, unspecified, uncomplicated: Secondary | ICD-10-CM

## 2011-06-14 DIAGNOSIS — G8929 Other chronic pain: Secondary | ICD-10-CM

## 2011-06-14 DIAGNOSIS — N938 Other specified abnormal uterine and vaginal bleeding: Secondary | ICD-10-CM

## 2011-06-14 DIAGNOSIS — N925 Other specified irregular menstruation: Secondary | ICD-10-CM

## 2011-06-14 NOTE — Patient Instructions (Signed)
Thank you for coming into clinic today.  I'm pleased with your progress with regards to your right leg pain/numbness. Keep taking the neurontin 300mg  at night and tylenol throughout the day as needed. Please also get your x-ray as soon as possible. I will review the results and let you know if anything comes back abnormal. I will let you know if anything comes back abnormal after todays exam. You may experience some additional cramping feelings today, so feel free to take some ibuprofen or advil as needed.  Keep trying to quit smoking. I know you can do it.  I will plan on seeing you back in 6 months for a follow-up unless something comes back abnormal from your tests today or if you feel like you need to be seen sooner.

## 2011-06-14 NOTE — Assessment & Plan Note (Signed)
Symptoms likely due to premenopausal changes but will f/u w/ endometrial biopsy results from today. Will consider FSH, LH, [place] studies in the future if necessary. If tissue sample inadequate will schedule for repeat endometrial biopsy w/ Dr. Jennette Kettle in [facility].

## 2011-06-14 NOTE — Assessment & Plan Note (Signed)
Improved w/ Neurontin 300mg  Qhs. Likely musculoskeletal in nature. No change in regimen at this time. Will follow up w/ x-ray as stated in previous note

## 2011-06-14 NOTE — Progress Notes (Signed)
  Subjective:    Patient ID: Kendra [name], female    DOB: 1966/04/26, 45 y.o.   MRN: [id number]  HPI Dysfunctional uterine bleeding: Continues to have 1-3 days of bleeding approximately 2 wks after her period. Pt reports just a couple of drops on one pad on those days. Denies any abdominal pain. LMP 05/26/11.   R Thigh pain: Pain significantly better at night and is able to sleep much better now. Pain well controlled on tylenol during the day. Pt has not gotten her R thigh x-ray but said she will tomorrow.  Tobacco: Continues to smoke 1/2 ppd. Spent 10 min discussing w/ pt but pt not ready to quit yet.    Review of Systems Denies: HA, SOB, CP, Abd pain, syncope, lightheadedness, dysuria, n/v/d,     Objective:   Physical Exam  Gen: NAD, Obese, alert  CV: RRR, I-II/VI systolic murmur  Res: CTAB, normal effort  Abd: NABS, Stool present, non-tender  GU: normal premenopausal appearing vaginal wall and posterior cervix,  Ext/Musc: normal nonpainful ROM, 2+ distal pulses Skin: intact, no rashes  Neuro: CN grossly intact   Urine pregnancy test: NEGATIVE     Assessment & Plan:    PROCEDURE NOTE: patient given informed consent signed copy in the chart. Appropriate time out taken. Pt given 800mg  ibuprofen prior to procedure. Wearing sterile gloves a sterile speculum was inserted into the vaginal canal and the cervix and cervical os were visualized. The cervix was cleaned twice with betadine. The anterior cervix was grasped and gentle anterior traction was applied to bring the posterior facing cervical opening better into view. A cervical dilator was used and was inserted to a depth of 5cm. This came w/ considerable difficulty as the cervical opening was very tight. The curette was then inserted to a depth of 5cm and an endometrial tissue sample was obtained. The tenaculum and speculum were removed. The pt tolerated the procedure well, and was instructed to use additional pain mediations this  evening if needed.

## 2011-06-14 NOTE — Assessment & Plan Note (Signed)
Pt not ready to quit at this time. Spent ~30min discussing w/ pt. Pt aware of risks of continuing. Will address again at next appointment.

## 2011-06-15 ENCOUNTER — Telehealth: Payer: Self-pay | Admitting: Family Medicine

## 2011-06-15 ENCOUNTER — Ambulatory Visit (HOSPITAL_COMMUNITY)
Admission: RE | Admit: 2011-06-15 | Discharge: 2011-06-15 | Disposition: A | Discharge status: Home / Self Care | Payer: Medicare Other | Source: Ambulatory Visit | Attending: Family Medicine | Admitting: Family Medicine

## 2011-06-15 DIAGNOSIS — M79609 Pain in unspecified limb: Secondary | ICD-10-CM | POA: Insufficient documentation

## 2011-06-15 DIAGNOSIS — M79651 Pain in right thigh: Secondary | ICD-10-CM

## 2011-06-15 NOTE — Telephone Encounter (Signed)
Kendra [name] was in yesterday.  Her ear is now swollen and the MD she was referred to cannot take her now so she needs to see someone else.  Medicaid is not accepted at the one she was referred to.

## 2011-06-15 NOTE — Telephone Encounter (Signed)
LMOVM informing pt that notes were faxed to [facility] triage nurse .Fleeger, Maryjo [place]

## 2011-06-20 ENCOUNTER — Telehealth: Payer: Self-pay | Admitting: Family Medicine

## 2011-06-20 NOTE — Telephone Encounter (Signed)
Kendra [name] is calling wanting a referral sent to a specialist for her ear because it is really hurting her.  If a letter can be sent to her for where she needs to go, she will call to arrange the appointment herself.  She also wants the results of her Xrays sent to her as well.

## 2011-06-20 NOTE — Telephone Encounter (Signed)
Will forward to MD.  

## 2011-06-21 NOTE — Telephone Encounter (Signed)
Pt states that her work sent her home today because she has pus coming out of her ear.  Advised that she should call [facility] and let them know whats going on, maybe they could get her in sooner.  Moriah Shawley, Maryjo [place]

## 2011-06-22 ENCOUNTER — Ambulatory Visit (INDEPENDENT_AMBULATORY_CARE_PROVIDER_SITE_OTHER): Payer: Medicare Other | Admitting: Family Medicine

## 2011-06-22 ENCOUNTER — Ambulatory Visit: Payer: Medicare Other

## 2011-06-22 ENCOUNTER — Encounter: Payer: Self-pay | Admitting: Family Medicine

## 2011-06-22 DIAGNOSIS — L723 Sebaceous cyst: Secondary | ICD-10-CM

## 2011-06-22 DIAGNOSIS — L720 Epidermal cyst: Secondary | ICD-10-CM

## 2011-06-22 NOTE — Patient Instructions (Signed)
Thanks for coming in today I think the cyst by your ear is infected, but is localized only to the cyst. Please come back or go to the emergency room if your symptoms worsen as discussed. I think this should go away on its own with warm compresses (15-37min 4 times a day). Please continue taking an NSAID (ibuprofen, Advil, etc), up to 800 mg 4 times a day. We will monitor this until your appointment in March with the dermatologist for removal.

## 2011-06-22 NOTE — Progress Notes (Signed)
  Subjective:    Patient ID: Kendra [name], female    DOB: Dec 10, 1965, 45 y.o.   MRN: [id number]  HPI  Inflammed Epidermoid Cyst: Progressive pain and swelling of preauricular cyst for the past 2 weeks. Patient sent home from work yesterday it do to [facility] coming out of cyst. Drainage is described as white and yellow. Patient has not tried anything to alleviate the pain or to promote drainage. Patient's states that this has occurred off and on for the past 4 years. Patient's is set to meet with dermatology in March of 2013 for excision of cyst.   Review of Systems Negative -fever, nausea, vomiting, diarrhea, chest pain, shortness of breath, jaw pain, change in hearing,    Objective:   Physical Exam  Gen: NAD, Obese, alert  CV: RRR, I-II/VI systolic murmur  Res: CTAB, normal effort  Abd: NABS, Stool present, non-tender  Ext/Musc: normal nonpainful ROM, 2+ distal pulses  Skin:1.5x2.5cm preauricular cyst w/ superficial flaking of the skin. Painful to palpation over the cyst. No extension of pain outside the cyst. Skin is non-erythematous. Neuro: CN grossly intact       Assessment & Plan:

## 2011-06-22 NOTE — Assessment & Plan Note (Signed)
Inflamed epidermoid cyst. Low concern for progression of infection beyond cystic capsule. Will treat conservatively at this time with NSAIDs and warm compresses for 15-30 minutes 4 times a day. Patient aware of warning signs of progression of infection and will contact our office if necessary.

## 2011-06-28 NOTE — Progress Notes (Signed)
Addended by: Jennette Bill on: 06/28/2011 08:50 AM   Modules accepted: Orders

## 2011-09-06 ENCOUNTER — Other Ambulatory Visit: Payer: Self-pay | Admitting: Family Medicine

## 2011-09-06 DIAGNOSIS — N938 Other specified abnormal uterine and vaginal bleeding: Secondary | ICD-10-CM

## 2011-09-06 NOTE — Progress Notes (Signed)
Called and left message w/ pt to call office. Pt needs to have transvaginal US after reviewing path report from endometrial biopsy. Will attempt to contact pt again.

## 2011-09-12 NOTE — Progress Notes (Signed)
Addended by: Garen Grams F on: 09/12/2011 09:44 AM   Modules accepted: Orders

## 2011-09-19 ENCOUNTER — Ambulatory Visit (HOSPITAL_COMMUNITY)
Admission: RE | Admit: 2011-09-19 | Discharge: 2011-09-19 | Disposition: A | Discharge status: Home / Self Care | Payer: Medicare Other | Source: Ambulatory Visit | Attending: Family Medicine | Admitting: Family Medicine

## 2011-09-19 DIAGNOSIS — N938 Other specified abnormal uterine and vaginal bleeding: Secondary | ICD-10-CM | POA: Insufficient documentation

## 2011-09-19 DIAGNOSIS — D251 Intramural leiomyoma of uterus: Secondary | ICD-10-CM | POA: Insufficient documentation

## 2011-09-19 DIAGNOSIS — N949 Unspecified condition associated with female genital organs and menstrual cycle: Secondary | ICD-10-CM | POA: Insufficient documentation

## 2011-10-19 ENCOUNTER — Ambulatory Visit: Payer: Medicare Other | Admitting: Family Medicine

## 2011-11-05 ENCOUNTER — Encounter: Payer: Self-pay | Admitting: Family Medicine

## 2011-11-05 ENCOUNTER — Ambulatory Visit (INDEPENDENT_AMBULATORY_CARE_PROVIDER_SITE_OTHER): Payer: Medicare Other | Admitting: Family Medicine

## 2011-11-05 VITALS — BP 130/68 | HR 76 | Temp 98.1°F | Ht 64.0 in | Wt 248.0 lb

## 2011-11-05 DIAGNOSIS — M79609 Pain in unspecified limb: Secondary | ICD-10-CM

## 2011-11-05 DIAGNOSIS — R209 Unspecified disturbances of skin sensation: Secondary | ICD-10-CM

## 2011-11-05 DIAGNOSIS — M542 Cervicalgia: Secondary | ICD-10-CM

## 2011-11-05 DIAGNOSIS — L723 Sebaceous cyst: Secondary | ICD-10-CM

## 2011-11-05 DIAGNOSIS — M545 Low back pain: Secondary | ICD-10-CM

## 2011-11-05 DIAGNOSIS — G8929 Other chronic pain: Secondary | ICD-10-CM

## 2011-11-05 DIAGNOSIS — N926 Irregular menstruation, unspecified: Secondary | ICD-10-CM

## 2011-11-05 DIAGNOSIS — R200 Anesthesia of skin: Secondary | ICD-10-CM

## 2011-11-05 DIAGNOSIS — N938 Other specified abnormal uterine and vaginal bleeding: Secondary | ICD-10-CM

## 2011-11-05 DIAGNOSIS — L720 Epidermal cyst: Secondary | ICD-10-CM

## 2011-11-05 DIAGNOSIS — M79651 Pain in right thigh: Secondary | ICD-10-CM

## 2011-11-05 NOTE — Patient Instructions (Signed)
Thank you for coming into clinic today. I would like for you to be seen by the Sports medicine doctors for your leg pain. I will have my staff contact their office to set up an appointment. If you have not heard from them by the end of the week, please call me at the office.   Please try massage and heat/ice for your neck pain. If this does not work let me know and we can try a low dose muscle relaxer.   Have a great day.

## 2011-11-06 DIAGNOSIS — R202 Paresthesia of skin: Secondary | ICD-10-CM | POA: Insufficient documentation

## 2011-11-06 DIAGNOSIS — R200 Anesthesia of skin: Secondary | ICD-10-CM | POA: Insufficient documentation

## 2011-11-06 NOTE — Assessment & Plan Note (Signed)
Unsure of etiology. Thigh/hip film unremarkable. Will refer to SM.

## 2011-11-06 NOTE — Assessment & Plan Note (Signed)
No flairs since last appointment. Derm to excise in a few weeks.

## 2011-11-06 NOTE — Assessment & Plan Note (Signed)
Likely musculoskeletal. Low concern for nerve impingement. Pt to try massage, NSAIDs, heat/cold. If not improving, or worsening, will consider muscle relaxer/injection, PT, Xray

## 2011-11-06 NOTE — Assessment & Plan Note (Signed)
Secondary to positional nerve impingement while sleeping. Pt to avoid sleeping on R side. Will investigate further if warranted.

## 2011-11-06 NOTE — Assessment & Plan Note (Signed)
Resolved. Reviewed labs/imaging results w/ pt. No further workup at this time.

## 2011-11-06 NOTE — Progress Notes (Signed)
  Subjective:    Patient ID: Kendra [name], female    DOB: Sep 08, 1965, 46 y.o.   MRN: [id number]  HPI CC: R thigh pain and R arm numbness, neck pain   R Thigh pain: Pt reports this has worsened since previous visits (reviewed old clinic notes) w/ no change in overall deficits. Denies loss of bowel/bladder function. Denies falls. Some relief w/ icy hot and tylenol/NSAIDS, and massage. No exacerbating factors. Continues to be "deep" pain/numbness primarily in her ant/lateral thigh. No spinal origination or spinal pain/tenderness. No injury. Unable to work now due to pain  R Arm numbness: Numbness in R arm upon waiking after sleeping on R side. Pain has been going on for past few weeks. Numbness relieved after waking and changing positions. No decreased strength. No previous injury to arm  Neck pain: present for past few weeks on R side. Non radiating, relieved w/ NSAIDs. Sharp to ache in nature. Denies nuchal regidity, change in mentation, fever, rash.   Epidermoid cyst: No flares since last visit. Derm to excise in a few weeks  Dysfunctional uterine bleeding: No bleeding. Continues to have normal periods. Reviewed labs/imaging w/ pt again.   Review of Systems See HPI w/ the following additions Negative: HA, n/v/d/c, fever, CP, SOB, syncope, lightheadedness, hematuria, hematochezia, hematemesis, dysuria, change in vision.     Objective:   Physical Exam  HEENT: Point soft tissue tenderness over the posterior neck muscles, w/ muscle tightness. No bony abnormality. Normal ROM (side to side, chin to chest, chin up) negative straight leg raise Musc: Negative spurlings R&L. Negative empty can. Negative hawkins. 3+ upper extremity and hand strength bilat. Strength 3+ bilat in LE. Gait normal. Complained of intermittent pain in deep tissue of lateral thigh. Normal ROM on passive motion.  Neuro: normal gait, normal finger to nose.       Assessment & Plan:

## 2011-11-06 NOTE — Assessment & Plan Note (Signed)
No complaints today. Cont. Neurontin

## 2011-11-20 ENCOUNTER — Ambulatory Visit (INDEPENDENT_AMBULATORY_CARE_PROVIDER_SITE_OTHER): Payer: Medicare Other | Admitting: Sports Medicine

## 2011-11-20 VITALS — BP 120/70 | Ht 64.0 in | Wt 246.0 lb

## 2011-11-20 DIAGNOSIS — R202 Paresthesia of skin: Secondary | ICD-10-CM

## 2011-11-20 DIAGNOSIS — R200 Anesthesia of skin: Secondary | ICD-10-CM

## 2011-11-20 DIAGNOSIS — M7061 Trochanteric bursitis, right hip: Secondary | ICD-10-CM

## 2011-11-20 DIAGNOSIS — R209 Unspecified disturbances of skin sensation: Secondary | ICD-10-CM

## 2011-11-20 DIAGNOSIS — M76899 Other specified enthesopathies of unspecified lower limb, excluding foot: Secondary | ICD-10-CM

## 2011-11-20 MED ORDER — PREDNISONE 50 MG PO TABS: ORAL_TABLET | ORAL | Status: DC

## 2011-11-20 MED ORDER — MELOXICAM 15 MG PO TABS: ORAL_TABLET | ORAL | Status: DC

## 2011-11-20 NOTE — Progress Notes (Signed)
  Subjective:    Patient ID: Oretha Caprice, female    DOB: Dec 26, 1965, 46 y.o.   MRN: [id number]  HPI Katalia comes in to discuss right neck and shoulder pain as well as right hip pain.  Right shoulder pain has been present for a long time now, starts in her neck radiates down the lateral aspect of her upper and forearm, into her first into fingers. She describes it as a numbness and tingling. She has already been through oral anti-inflammatories, has not yet been through formal physical therapy for her neck. She has not yet had any imaging either.  Right hip pain: Localized over the greater trochanteric bursa, radiates somewhat down the lateral aspect of the thigh but not past the knee. Does not originate from the back. She has tried various oral anti-inflammatories, but no therapy or injections have been done.  Past medical history, surgical history, family history, social history, allergies, and medications reviewed from the medical record and no changes needed.   Review of Systems    No fevers, chills, night sweats, weight loss, chest pain, or shortness of breath.  Social History: Non-smoker. Objective:   Physical Exam General:  Well developed, well nourished, and in no acute distress. Neuro:  Alert and oriented x3, extra-ocular muscles intact. Skin: Warm and dry, no rashes noted. Respiratory:  Not using accessory muscles, speaking in full sentences. Musculoskeletal: Neck: Inspection unremarkable. No palpable stepoffs. Negative Spurling's maneuver. Full neck range of motion Grip strength and sensation normal in bilateral hands Strength was 4/5 to elbow flexion and shoulder external rotation. No sensory change to C4 to T1 Reflexes normal  Right Hip: ROM IR: 45 Deg, ER: 45 Deg, Flexion: 120 Deg, Extension: 100 Deg, Abduction: 45 Deg, Adduction: 45 Deg Strength IR: 5/5, ER: 5/5, Flexion: 5/5, Extension: 5/5, Abduction: 5/5, Adduction: 5/5 Pelvic alignment unremarkable to  inspection and palpation. Standing hip rotation and gait without trendelenburg sign / unsteadiness. Greater trochanter without tenderness to palpation. No tenderness over piriformis Significant tenderness over the greater trochanteric bursa. No pain with FABER or FADIR. No SI joint tenderness and normal minimal SI movement.  Procedure:right greater trochanteric bursa injection. Consent obtained and verified. Time-out conducted. Noted no overlying erythema, induration, or other signs of local infection. Skin prepped in a sterile fashion. Topical analgesic spray: Ethyl chloride. Joint: greater trochanter bursa on the right side. Completed without difficulty. Meds: 1 cc Depo-Medrol 40, 4 cc lidocaine. Pain immediately improved suggesting accurate placement of the medication. Advised to call if fevers/chills, erythema, induration, drainage, or persistent bleeding.      Assessment & Plan:

## 2011-11-20 NOTE — Assessment & Plan Note (Signed)
Injection as above. Meloxicam for pain. Formal physical therapy combined with her neck therapy as above. We will see her back in 4-6 weeks for this.

## 2011-11-20 NOTE — Assessment & Plan Note (Signed)
Symptoms most likely related to C5-C6 cervical radiculopathy. Prednisone burst, formal physical therapy, meloxicam. We'll see her back in 4 weeks to see how she's doing, if no better MRI cervical spine followed by selective nerve root injections.

## 2011-11-26 ENCOUNTER — Ambulatory Visit
Admission: RE | Admit: 2011-11-26 | Discharge: 2011-11-26 | Disposition: A | Discharge status: Home / Self Care | Payer: Medicare Other | Source: Ambulatory Visit | Attending: Sports Medicine | Admitting: Sports Medicine

## 2011-11-26 DIAGNOSIS — R200 Anesthesia of skin: Secondary | ICD-10-CM

## 2011-11-30 ENCOUNTER — Emergency Department (HOSPITAL_COMMUNITY): Payer: Medicare Other

## 2011-11-30 ENCOUNTER — Emergency Department (HOSPITAL_COMMUNITY)
Admission: EM | Admit: 2011-11-30 | Discharge: 2011-11-30 | Disposition: A | Discharge status: Home / Self Care | Payer: Medicare Other | Attending: Emergency Medicine | Admitting: Emergency Medicine

## 2011-11-30 ENCOUNTER — Other Ambulatory Visit: Payer: Self-pay

## 2011-11-30 ENCOUNTER — Encounter (HOSPITAL_COMMUNITY): Payer: Self-pay | Admitting: Emergency Medicine

## 2011-11-30 DIAGNOSIS — R0602 Shortness of breath: Secondary | ICD-10-CM | POA: Insufficient documentation

## 2011-11-30 DIAGNOSIS — R05 Cough: Secondary | ICD-10-CM | POA: Insufficient documentation

## 2011-11-30 DIAGNOSIS — R059 Cough, unspecified: Secondary | ICD-10-CM | POA: Insufficient documentation

## 2011-11-30 DIAGNOSIS — R0789 Other chest pain: Secondary | ICD-10-CM | POA: Insufficient documentation

## 2011-11-30 LAB — DIFFERENTIAL
Basophils Relative: 0 % (ref 0–1)
Eosinophils Absolute: 0.1 10*3/uL (ref 0.0–0.7)
Eosinophils Relative: 1 % (ref 0–5)
Lymphocytes Relative: 27 % (ref 12–46)
Neutro Abs: 5.4 10*3/uL (ref 1.7–7.7)

## 2011-11-30 LAB — POCT I-STAT, CHEM 8
BUN: 13 mg/dL (ref 6–23)
Chloride: 108 mEq/L (ref 96–112)
Creatinine, Ser: 0.9 mg/dL (ref 0.50–1.10)
Sodium: 139 mEq/L (ref 135–145)
TCO2: 22 mmol/L (ref 0–100)

## 2011-11-30 LAB — PRO B NATRIURETIC PEPTIDE: Pro B Natriuretic peptide (BNP): 95.4 pg/mL (ref 0–125)

## 2011-11-30 LAB — CBC
HCT: 27.5 % — ABNORMAL LOW (ref 36.0–46.0)
Hemoglobin: 8 g/dL — ABNORMAL LOW (ref 12.0–15.0)
MCH: 21.1 pg — ABNORMAL LOW (ref 26.0–34.0)
MCHC: 29.1 g/dL — ABNORMAL LOW (ref 30.0–36.0)
RBC: 3.79 MIL/uL — ABNORMAL LOW (ref 3.87–5.11)

## 2011-11-30 LAB — POCT I-STAT TROPONIN I: Troponin i, poc: 0.01 ng/mL (ref 0.00–0.08)

## 2011-11-30 MED ORDER — HYDROMORPHONE HCL PF 1 MG/ML IJ SOLN
1.0000 mg | Freq: Once | INTRAMUSCULAR | Status: AC
  Administered 2011-11-30: 1 mg via INTRAVENOUS
  Filled 2011-11-30: qty 1

## 2011-11-30 MED ORDER — HYDROCODONE-ACETAMINOPHEN 5-325 MG PO TABS: 1.0000 | ORAL_TABLET | ORAL | Status: AC | PRN

## 2011-11-30 MED ORDER — ASPIRIN 81 MG PO CHEW
324.0000 mg | CHEWABLE_TABLET | Freq: Once | ORAL | Status: AC
  Administered 2011-11-30: 324 mg via ORAL
  Filled 2011-11-30: qty 4

## 2011-11-30 MED ORDER — ONDANSETRON HCL 4 MG/2ML IJ SOLN
4.0000 mg | Freq: Once | INTRAMUSCULAR | Status: AC
  Administered 2011-11-30: 4 mg via INTRAVENOUS
  Filled 2011-11-30: qty 2

## 2011-11-30 NOTE — ED Notes (Signed)
midsternal cp x 3 days denies n/v/d/sob feels better when she lays down she states

## 2011-11-30 NOTE — Discharge Instructions (Signed)
Chest Pain (Nonspecific) It is often hard to give a specific diagnosis for the cause of chest pain. There is always a chance that your pain could be related to something serious, such as a heart attack or a blood clot in the lungs. You need to follow up with your caregiver for further evaluation. CAUSES   Heartburn.   Pneumonia or bronchitis.   Anxiety or stress.   Inflammation around your heart (pericarditis) or lung (pleuritis or pleurisy).   A blood clot in the lung.   A collapsed lung (pneumothorax). It can develop suddenly on its own (spontaneous pneumothorax) or from injury (trauma) to the chest.   Shingles infection (herpes zoster virus).  The chest wall is composed of bones, muscles, and cartilage. Any of these can be the source of the pain.  The bones can be bruised by injury.   The muscles or cartilage can be strained by coughing or overwork.   The cartilage can be affected by inflammation and become sore (costochondritis).  DIAGNOSIS  Lab tests or other studies, such as X-rays, electrocardiography, stress testing, or cardiac imaging, may be needed to find the cause of your pain.  TREATMENT   Treatment depends on what may be causing your chest pain. Treatment may include:   Acid blockers for heartburn.   Anti-inflammatory medicine.   Pain medicine for inflammatory conditions.   Antibiotics if an infection is present.   You may be advised to change lifestyle habits. This includes stopping smoking and avoiding alcohol, caffeine, and chocolate.   You may be advised to keep your head raised (elevated) when sleeping. This reduces the chance of acid going backward from your stomach into your esophagus.   Most of the time, nonspecific chest pain will improve within 2 to 3 days with rest and mild pain medicine.  HOME CARE INSTRUCTIONS   If antibiotics were prescribed, take your antibiotics as directed. Finish them even if you start to feel better.   For the next few  days, avoid physical activities that bring on chest pain. Continue physical activities as directed.   Do not smoke.   Avoid drinking alcohol.   Only take over-the-counter or prescription medicine for pain, discomfort, or fever as directed by your caregiver.   Follow your caregiver's suggestions for further testing if your chest pain does not go away.   Keep any follow-up appointments you made. If you do not go to an appointment, you could develop lasting (chronic) problems with pain. If there is any problem keeping an appointment, you must call to reschedule.  SEEK MEDICAL CARE IF:   You think you are having problems from the medicine you are taking. Read your medicine instructions carefully.   Your chest pain does not go away, even after treatment.   You develop a rash with blisters on your chest.  SEEK IMMEDIATE MEDICAL CARE IF:   You have increased chest pain or pain that spreads to your arm, neck, jaw, back, or abdomen.   You develop shortness of breath, an increasing cough, or you are coughing up blood.   You have severe back or abdominal pain, feel nauseous, or vomit.   You develop severe weakness, fainting, or chills.   You have a fever.  THIS IS AN EMERGENCY. Do not wait to see if the pain will go away. Get medical help at once. Call your local emergency services (911 in [place] Do not drive yourself to the hospital. MAKE SURE YOU:   Understand these instructions.     Will watch your condition.   Will get help right away if you are not doing well or get worse.  Document Released: 05/23/2005 Document Revised: 08/02/2011 Document Reviewed: 03/18/2008 [facility] Patient Information 2012 [place], [place].Pain of Unknown Etiology (Pain Without a Known Cause) You have come to your caregiver because of pain. Pain can occur in any part of the body. Often there is not a definite cause. If your laboratory (blood or urine) work was normal and x-rays or other studies were normal, your  caregiver may treat you without knowing the cause of the pain. An example of this is the headache. Most headaches are diagnosed by taking a history. This means your caregiver asks you questions about your headaches. Your caregiver determines a treatment based on your answers. Usually testing done for headaches is normal. Often testing is not done unless there is no response to medications. Regardless of where your pain is located today, you can be given medications to make you comfortable. If no physical cause of pain can be found, most cases of pain will gradually leave as suddenly as they came.  If you have a painful condition and no reason can be found for the pain, It is importantthat you follow up with your caregiver. If the pain becomes worse or does not go away, it may be necessary to repeat tests and look further for a possible cause.  Only take over-the-counter or prescription medicines for pain, discomfort, or fever as directed by your caregiver.   For the protection of your privacy, test results can not be given over the phone. Make sure you receive the results of your test. Ask as to how these results are to be obtained if you have not been informed. It is your responsibility to obtain your test results.   You may continue all activities unless the activities cause more pain. When the pain lessens, it is important to gradually resume normal activities. Resume activities by beginning slowly and gradually increasing the intensity and duration of the activities or exercise. During periods of severe pain, bed-rest may be helpful. Lay or sit in any position that is comfortable.   Ice used for acute (sudden) conditions may be effective. Use a large plastic bag filled with ice and wrapped in a towel. This may provide pain relief.   See your caregiver for continued problems. They can help or refer you for exercises or physical therapy if necessary.  If you were given medications for your condition,  do not drive, operate machinery or power tools, or sign legal documents for 24 hours. Do not drink alcohol, take sleeping pills, or take other medications that may interfere with treatment. See your caregiver immediately if you have pain that is becoming worse and not relieved by medications. Document Released: 05/08/2001 Document Revised: 08/02/2011 Document Reviewed: 08/13/2005 [facility] Patient Information 2012 [place], [place].

## 2011-11-30 NOTE — ED Notes (Signed)
Patient transported to X-ray 

## 2011-11-30 NOTE — ED Notes (Signed)
PA at pt bedside

## 2011-11-30 NOTE — ED Notes (Signed)
Returned from radiology. 

## 2011-11-30 NOTE — ED Notes (Signed)
PA at bedside.

## 2011-11-30 NOTE — ED Provider Notes (Signed)
History     CSN: [id number]  Arrival date & time 11/30/11  8469   First MD Initiated Contact with Patient 11/30/11 [phone]      Chief Complaint  Patient presents with  . Chest Pain    (Consider location/radiation/quality/duration/timing/severity/associated sxs/prior treatment) HPI Comments: Patient here with a 3 day history of substernal chest pain - states pain is worse with lying flat, coughing and deep inspiration and palpation of chest - she denies radiation of the pain - reports she is a smoker and reports a dry and non-productive cough, denies fever, chills, back pain, shortness of breath, nausea, vomiting, diaphoresis.  Reports no PMH with any risk factors for CAD or PE, no calf or leg pain and no exogenous source of estrogens.  Patient is a 46 y.o. female presenting with chest pain. The history is provided by the patient, a relative and a parent. No language interpreter was used.  Chest Pain The chest pain began 3 - 5 days ago. Chest pain occurs constantly. The chest pain is unchanged. The pain is associated with breathing. At its most intense, the pain is at 10/10. The pain is currently at 8/10. The quality of the pain is described as aching and sharp. The pain does not radiate. Chest pain is worsened by certain positions and deep breathing. Primary symptoms include cough. Pertinent negatives for primary symptoms include no fever, no fatigue, no syncope, no shortness of breath, no wheezing, no palpitations, no abdominal pain, no nausea, no vomiting, no dizziness and no altered mental status.  Pertinent negatives for associated symptoms include no claudication, no diaphoresis, no lower extremity edema, no near-syncope, no numbness, no orthopnea, no paroxysmal nocturnal dyspnea and no weakness. She tried nothing for the symptoms. Risk factors include obesity.  Pertinent negatives for past medical history include no CAD, no diabetes, no DVT, no hyperlipidemia, no hypertension and no PE.  Her  family medical history is significant for diabetes in family and hypertension in family.     History reviewed. No pertinent past medical history.  Past Surgical History  Procedure Date  . Cesarean section   . Tubal ligation   . Tibia fracture surgery     History reviewed. No pertinent family history.  History  Substance Use Topics  . Smoking status: Current Everyday Smoker -- 0.3 packs/day    Types: Cigarettes  . Smokeless tobacco: Never Used  . Alcohol Use: 3.6 oz/week    6 Cans of beer per week    OB History    Grav Para Term Preterm Abortions TAB SAB Ect Mult Living                  Review of Systems  Constitutional: Negative for fever, diaphoresis and fatigue.  Respiratory: Positive for cough. Negative for shortness of breath and wheezing.   Cardiovascular: Positive for chest pain. Negative for palpitations, orthopnea, claudication, syncope and near-syncope.  Gastrointestinal: Negative for nausea, vomiting and abdominal pain.  Genitourinary: Negative for flank pain.  Neurological: Negative for dizziness, weakness and numbness.  Psychiatric/Behavioral: Negative for altered mental status.  All other systems reviewed and are negative.    Allergies  Review of patient's allergies indicates no known allergies.  Home Medications  No current outpatient prescriptions on file.  BP 111/52  Pulse 73  Temp(Src) 98.3 F (36.8 C) (Oral)  Resp 20  SpO2 100%  LMP 11/15/2011  Physical Exam  Nursing note and vitals reviewed. Constitutional: She is oriented to person, place, and time. She appears  well-developed and well-nourished. No distress.  HENT:  Head: Normocephalic and atraumatic.  Right Ear: External ear normal.  Left Ear: External ear normal.  Nose: Nose normal.  Mouth/Throat: Oropharynx is clear and moist. No oropharyngeal exudate.  Eyes: Conjunctivae are normal. Pupils are equal, round, and reactive to light. No scleral icterus.  Neck: Normal range of  motion. Neck supple.  Cardiovascular: Normal rate, regular rhythm and normal heart sounds.  Exam reveals no gallop and no friction rub.   No murmur heard. Pulmonary/Chest: Effort normal and breath sounds normal. No respiratory distress. She has no decreased breath sounds. She has no wheezes. She has no rales. She exhibits tenderness.    Abdominal: Soft. Bowel sounds are normal. She exhibits no distension and no mass. There is no tenderness. There is no rebound and no guarding.  Musculoskeletal: Normal range of motion. She exhibits no edema and no tenderness.  Lymphadenopathy:    She has no cervical adenopathy.  Neurological: She is alert and oriented to person, place, and time. No cranial nerve deficit.  Skin: Skin is warm and dry. No rash noted. No erythema. No pallor.  Psychiatric: She has a normal mood and affect. Her behavior is normal. Judgment and thought content normal.    ED Course  Procedures (including critical care time)  Labs Reviewed  POCT I-STAT, CHEM 8 - Abnormal; Notable for the following:    Hemoglobin 9.9 (*)    HCT 29.0 (*)    All other components within normal limits  POCT I-STAT TROPONIN I  CBC  DIFFERENTIAL  PRO B NATRIURETIC PEPTIDE   No results found. Results for orders placed during the hospital encounter of 11/30/11  CBC      Component Value Range   WBC 8.2  4.0 - 10.5 (K/uL)   RBC 3.79 (*) 3.87 - 5.11 (MIL/uL)   Hemoglobin 8.0 (*) 12.0 - 15.0 (g/dL)   HCT 16.1 (*) 09.6 - 46.0 (%)   MCV 72.6 (*) 78.0 - 100.0 (fL)   MCH 21.1 (*) 26.0 - 34.0 (pg)   MCHC 29.1 (*) 30.0 - 36.0 (g/dL)   RDW 04.5 (*) 40.9 - 15.5 (%)   Platelets 329  150 - 400 (K/uL)  DIFFERENTIAL      Component Value Range   Neutrophils Relative 66  43 - 77 (%)   Lymphocytes Relative 27  12 - 46 (%)   Monocytes Relative 6  3 - 12 (%)   Eosinophils Relative 1  0 - 5 (%)   Basophils Relative 0  0 - 1 (%)   Neutro Abs 5.4  1.7 - 7.7 (K/uL)   Lymphs Abs 2.2  0.7 - 4.0 (K/uL)    Monocytes Absolute 0.5  0.1 - 1.0 (K/uL)   Eosinophils Absolute 0.1  0.0 - 0.7 (K/uL)   Basophils Absolute 0.0  0.0 - 0.1 (K/uL)   RBC Morphology POLYCHROMASIA PRESENT    PRO B NATRIURETIC PEPTIDE      Component Value Range   Pro B Natriuretic peptide (BNP) 95.4  0 - 125 (pg/mL)  POCT I-STAT, CHEM 8      Component Value Range   Sodium 139  135 - 145 (mEq/L)   Potassium 3.9  3.5 - 5.1 (mEq/L)   Chloride 108  96 - 112 (mEq/L)   BUN 13  6 - 23 (mg/dL)   Creatinine, Ser 8.11  0.50 - 1.10 (mg/dL)   Glucose, Bld 98  70 - 99 (mg/dL)   Calcium, Ion 9.14  1.12 -  1.32 (mmol/L)   TCO2 22  0 - 100 (mmol/L)   Hemoglobin 9.9 (*) 12.0 - 15.0 (g/dL)   HCT 40.9 (*) 81.1 - 46.0 (%)  POCT I-STAT TROPONIN I      Component Value Range   Troponin i, poc 0.01  0.00 - 0.08 (ng/mL)   Comment 3            Dg Chest 2 View  11/30/2011  *RADIOLOGY REPORT*  Clinical Data: Cough with chest pain.  CHEST - 2 VIEW  Comparison: 11/17/2009.  Findings: Normal heart size with clear lung fields.  No bony abnormality.  No change from priors.  IMPRESSION: Negative chest.  Original Report Authenticated By: Elsie Stain, M.D.   Dg Cervical Spine Complete  11/26/2011  *RADIOLOGY REPORT*  Clinical Data: 46 year old female with neck and shoulder pain on the right with radiculopathy involving the right hand.  CERVICAL SPINE - COMPLETE 4+ VIEW  Comparison: None.  Findings: Preserved cervical lordosis.  Prevertebral soft tissue contours are within normal limits. Cervicothoracic junction alignment is within normal limits.  Relatively preserved disc spaces. Bilateral posterior element alignment is within normal limits.  Leftward flexion of the neck on the AP view, otherwise normal AP alignment.  Lung apices are within normal limits.  C1-C2 alignment and odontoid process within normal limits.  IMPRESSION: No acute fracture or listhesis identified.  Ligamentous injury is not excluded.  Negative for age radiographic appearance of the  cervical spine.  Original Report Authenticated By: Harley Hallmark, M.D.      Chest wall pain    MDM  Patient with atypical chest pain with reproducible pain on palpation and deep inspiration with a PERC score of 8 with no clinical suspicion of PE, CAD.  Pain has been going on for 3 days - worse with cough - no sign of infection on chest x-ray and patient reports improvement in pain with medication here.  Plan to discharge home with follow up with [facility] this coming week.        Izola Price [place], [place] 11/30/11 1144

## 2011-11-30 NOTE — ED Notes (Signed)
Pt continues to rate her CP at 8/10 with relief from medication that she states is "wearing off".

## 2011-11-30 NOTE — ED Provider Notes (Signed)
9:01 AM  Date: 11/30/2011  Rate: 95  Rhythm: normal sinus rhythm  QRS Axis: normal  Intervals: normal PQRS:  Right atrial abnormality  ST/T Wave abnormalities: normal  Conduction Disutrbances:none  Narrative Interpretation: Borderline EKg.  Old EKG Reviewed: none available    Carleene Cooper III, MD 11/30/11 [phone]

## 2011-11-30 NOTE — ED Notes (Signed)
Patient states that she has had CP off and on for 3 days.  Pt states that CP is worse with movement in deep inspiration.  Pt states that she has had SOB at times with exertion. Pt denies any n/v.  Lungs are clear.  No edema noted.

## 2011-11-30 NOTE — ED Provider Notes (Signed)
Medical screening examination/treatment/procedure(s) were conducted as a shared visit with non-physician practitioner(s) and myself.  I personally evaluated the patient during the encounter 46 yo woman with pain in chest for 3 days.  She is a smoker, some hypertension.  Exam shows her to be an obese middle-aged woman.  Lungs clear, heart sounds normal.  Lab workup negative. Advised to stop smoking, followup with [facility].    Carleene Cooper III, MD 11/30/11 2036

## 2011-12-04 ENCOUNTER — Other Ambulatory Visit: Payer: Self-pay | Admitting: Dermatology

## 2011-12-06 ENCOUNTER — Telehealth: Payer: Self-pay | Admitting: Family Medicine

## 2011-12-06 NOTE — Telephone Encounter (Signed)
Spoke to pt by phone. Pt doing well. Still w/ CP that is superficial in nature and relieved w/ Meloxicam and or Percocet received from ED. Pt states she will call the clinic before going to ED when another medical problem arises. Pt to schedule a f/u appt w/ me if CP does not improve. Reports some relief from steroid injection at [facility]. Reports derm excision of epidermoid cyst this past wk. Doing well post op

## 2011-12-19 ENCOUNTER — Encounter: Payer: Self-pay | Admitting: Family Medicine

## 2011-12-19 ENCOUNTER — Ambulatory Visit (INDEPENDENT_AMBULATORY_CARE_PROVIDER_SITE_OTHER): Payer: Medicare Other | Admitting: Family Medicine

## 2011-12-19 VITALS — BP 96/62 | HR 108 | Temp 98.3°F | Ht 64.0 in | Wt 252.0 lb

## 2011-12-19 DIAGNOSIS — R0789 Other chest pain: Secondary | ICD-10-CM

## 2011-12-19 DIAGNOSIS — K219 Gastro-esophageal reflux disease without esophagitis: Secondary | ICD-10-CM

## 2011-12-19 DIAGNOSIS — D509 Iron deficiency anemia, unspecified: Secondary | ICD-10-CM

## 2011-12-19 MED ORDER — FERROUS SULFATE 325 (65 FE) MG PO TABS: 325.0000 mg | ORAL_TABLET | Freq: Every day | ORAL | Status: DC

## 2011-12-19 MED ORDER — OMEPRAZOLE 40 MG PO CPDR: 40.0000 mg | DELAYED_RELEASE_CAPSULE | Freq: Every day | ORAL | Status: DC

## 2011-12-19 NOTE — Patient Instructions (Addendum)
Thank you for coming in today. Please go to [facility] for your x-ray. I will call you with the results. Please also start taking your iron pills and vitamin C as prescribed. THis will help your anemia. Please come back in two weeks for repeat lab work. Please also take your acid pill once a day. Have a great day.

## 2011-12-20 ENCOUNTER — Telehealth: Payer: Self-pay | Admitting: *Deleted

## 2011-12-20 DIAGNOSIS — K219 Gastro-esophageal reflux disease without esophagitis: Secondary | ICD-10-CM

## 2011-12-20 MED ORDER — ESOMEPRAZOLE MAGNESIUM 40 MG PO CPDR: 40.0000 mg | DELAYED_RELEASE_CAPSULE | Freq: Every day | ORAL | Status: DC

## 2011-12-20 NOTE — Telephone Encounter (Signed)
Patient was on omeprazole 40mg  for GERD and insurance needed pre-authorization for it. Going through the chart, there was no mention of specific reason for omeprazole versus other PPI. Went ahead and prescribed Nexium 40mg  since this is on the preferred list.

## 2011-12-20 NOTE — Telephone Encounter (Signed)
PA required for Omeprazole 40 mg. Form placed in Intern to do box.  Preferred meds are omeprazole 20 , 10 , Nexium 40, 20, 10, 5 ,2.5  and Dexilang.

## 2011-12-24 ENCOUNTER — Telehealth: Payer: Self-pay | Admitting: Family Medicine

## 2011-12-24 ENCOUNTER — Ambulatory Visit (HOSPITAL_COMMUNITY)
Admission: RE | Admit: 2011-12-24 | Discharge: 2011-12-24 | Disposition: A | Discharge status: Home / Self Care | Payer: Medicare Other | Source: Ambulatory Visit | Attending: Family Medicine | Admitting: Family Medicine

## 2011-12-24 ENCOUNTER — Other Ambulatory Visit: Payer: Self-pay | Admitting: Family Medicine

## 2011-12-24 ENCOUNTER — Encounter: Payer: Self-pay | Admitting: Family Medicine

## 2011-12-24 DIAGNOSIS — R0789 Other chest pain: Secondary | ICD-10-CM | POA: Insufficient documentation

## 2011-12-24 NOTE — Progress Notes (Signed)
Patient ID: Kendra [name], female   DOB: [birth year], 46 y.o.   MRN: [id number] I saw this patient with Dr. Konrad Dolores last week. She was having right sided sternoclavicular and clavicle pain. We ordered bilateral sternoclavicular joint x-rays. Somehow, in the radiology department they changed this to bilateral sacroiliac joints. I received a message that she would not be charged for the sacroiliac x-rays. I do not see where they called her back however and rescheduled her. She is scheduled to see Dr. Karie Schwalbe. tomorrow and have sent him a message. When I had looked at her chest x-ray, the most recent one, both of her proximal clavicles appeared slightly deformed, initially eroded. I note in looking back through her chart she has a lot of musculoskeletal issues. I think it would be prudent to send her for the bilateral sternoclavicular joint evaluations. I will leave that in Dr. Melvia Heaps hands.

## 2011-12-24 NOTE — Telephone Encounter (Signed)
Attempted to reach pt by phone, no answer.

## 2011-12-24 NOTE — Telephone Encounter (Signed)
Dear [facility] I just got a report from radiology---they did the WRONG XRAY on her--they x rayed her SI (pelvis) joints instead of her [place] (sternoclavicualr joints). They sent me a note that she iwll NOT be charged.   Please call her and ask her if they told HER of the mistake--she must think we are not very smart to x ray her pelvis when her chest was hurting. Ask please if they  (RADIOLOGY) have scheduled her for a return visit for the ap[propriate x ray---if not, I will send a note to Dr Velva Harman who she is scheduled to see tomorrow at sports medicine THANKS! Please tell her sorry for the unnecessary x ray and teh confusion Denny Levy

## 2011-12-25 ENCOUNTER — Ambulatory Visit (INDEPENDENT_AMBULATORY_CARE_PROVIDER_SITE_OTHER): Payer: Medicare Other | Admitting: Sports Medicine

## 2011-12-25 ENCOUNTER — Encounter: Payer: Self-pay | Admitting: Sports Medicine

## 2011-12-25 ENCOUNTER — Ambulatory Visit (HOSPITAL_COMMUNITY)
Admission: RE | Admit: 2011-12-25 | Discharge: 2011-12-25 | Disposition: A | Discharge status: Home / Self Care | Payer: Medicare Other | Source: Ambulatory Visit | Attending: Family Medicine | Admitting: Family Medicine

## 2011-12-25 ENCOUNTER — Other Ambulatory Visit: Payer: Self-pay | Admitting: Family Medicine

## 2011-12-25 VITALS — BP 110/76 | HR 69

## 2011-12-25 DIAGNOSIS — M25519 Pain in unspecified shoulder: Secondary | ICD-10-CM | POA: Insufficient documentation

## 2011-12-25 DIAGNOSIS — R0789 Other chest pain: Secondary | ICD-10-CM | POA: Insufficient documentation

## 2011-12-25 DIAGNOSIS — M7061 Trochanteric bursitis, right hip: Secondary | ICD-10-CM

## 2011-12-25 DIAGNOSIS — R209 Unspecified disturbances of skin sensation: Secondary | ICD-10-CM

## 2011-12-25 DIAGNOSIS — R52 Pain, unspecified: Secondary | ICD-10-CM

## 2011-12-25 DIAGNOSIS — M76899 Other specified enthesopathies of unspecified lower limb, excluding foot: Secondary | ICD-10-CM

## 2011-12-25 DIAGNOSIS — R200 Anesthesia of skin: Secondary | ICD-10-CM

## 2011-12-25 NOTE — Progress Notes (Signed)
  Subjective:    Patient ID: Kendra [name], female    DOB: 04-25-1966, 46 y.o.   MRN: [id number]  HPI Kendra [name] comes in for followup of cervical radiculopathy, as well as trochanteric bursitis. She's already been through a corticosteroid injection into her trochanter bursa, as well as oral prednisone for her neck. Overall she notes approximately a 40% improvement in her numbness and tingling in her hand, as well as her trochanter bursitis. Unfortunately she has not yet been called by the physical therapy department, and thus has had no therapy.  Her cervical spine x-rays were negative. Review of Systems    No fevers, chills, night sweats, weight loss, chest pain, or shortness of breath.  Social History: Non-smoker. Objective:   Physical Exam General:  Well developed, well nourished, and in no acute distress. Neuro:  Alert and oriented x3, extra-ocular muscles intact. Skin: Warm and dry, no rashes noted. Respiratory:  Not using accessory muscles, speaking in full sentences.     Assessment & Plan:

## 2011-12-25 NOTE — Assessment & Plan Note (Signed)
Again, unfortunately has not yet had physical therapy. I will see her back after 4 weeks of formal therapy.

## 2011-12-25 NOTE — Assessment & Plan Note (Signed)
Recent Cardiac workup for cause of CP negative. Likely musculoskeletal. Previous CXR w/ poorly visualized [place] joints. Will order  joint film for further evaluation. Pt already on significant antiinflammatory meds (Meloxicam and steroids per SM). Pt aware of ss of cardiac CP.

## 2011-12-25 NOTE — Assessment & Plan Note (Addendum)
Iron deficiency anemia w/ classically low MCV and iron studies. Ferrous Sulfate 325 Qday prescribed. Pt to increase Vit C intake as well. Will repeat CBC in 2 wks to assess change.

## 2011-12-25 NOTE — Telephone Encounter (Signed)
Pt states she has heard from radiology and that she is on her way to have the other xray done.

## 2011-12-25 NOTE — Assessment & Plan Note (Signed)
Patient will come back to see me after she's had 4 weeks of physical therapy. If no better at that point I would obtain a cervical spine MRI, in preparation for selective nerve root transforaminal injections.

## 2011-12-25 NOTE — Assessment & Plan Note (Signed)
GERD may be the cause of pts CP. 14 day trial of Nexium. Will reevaluate at that time.

## 2011-12-25 NOTE — Progress Notes (Signed)
  Subjective:    Patient ID: Kendra [name], female    DOB: 1966/07/12, 46 y.o.   MRN: [id number]  HPI CC: Chest pain,    CP: Pt recently evaluated for CP at ED. Full cardiac evaluation performed in ED on 11/30/11 and CP listed as musculoskeletal. Pt pain complaints are unchanged from ED. Most notable pain is reproducible on palpation and is primarily positional. Occasional flares in CP substernal w/o radiation, diaphoresis, palpitations, HA, or syncope. Pt denies chest trauma, change in exercise routine. Of note pt reported 11mo of CP to me but ED chart states she has only had CP for 3 days. Pt does report PMH of acid reflux in the past treated w/ Acid reducing pills. Denies current flares w/ meals or while lying down.   Anemia: Previous h/o abnormal uterine bleedign, which has been resolved for several months per pt. Minimal bleeding during periods. Denies hematochezia, hematuria, hematemesis. No h/o colorectal cancer. Denies any orthostatic symptoms. Iron studies and CBC reviewed w/ pt.   Tobacco: SMoking has increased to 2ppd. Pt aware of health risks. Discussed quitting. Pt not ready at this time to quit.  Musculoskeletal pain: Pt being followed actively by Sports medicine. F/u appt on 12/24/11. Recently received Steroid injection which pt reports great pain relief of hip, steroid burst, and meloxicam.  Review of Systems See HPI    Objective:   Physical Exam  CV: RRR, no m/r/g. No JVD Res: CTAB Musc: point tenderness over [place] joint on palpation. No bony abnormality appreciated. Costochondral jxns w/o pain on palpation. Gait normal Neuro: CN grossly intact       Assessment & Plan:

## 2011-12-27 ENCOUNTER — Telehealth: Payer: Self-pay | Admitting: *Deleted

## 2011-12-27 DIAGNOSIS — K219 Gastro-esophageal reflux disease without esophagitis: Secondary | ICD-10-CM

## 2011-12-27 MED ORDER — OMEPRAZOLE 20 MG PO CPDR: 20.0000 mg | DELAYED_RELEASE_CAPSULE | Freq: Two times a day (BID) | ORAL | Status: AC

## 2011-12-27 NOTE — Telephone Encounter (Signed)
PA required for omeprazole 40 mg one daily. Called insurance medicare part D and was  told that  If rx is changed to omeprazole 20 mg two daily it will be covered. Pharmacy notified and  she will change to omeprazole 20 mg 2 daily 60/ 5 refills. Will notified MD to please change in chart on med list.

## 2011-12-27 NOTE — Telephone Encounter (Signed)
Changed medication to omeprazole 20mg  bid as instructed by pharmacy

## 2012-01-01 ENCOUNTER — Ambulatory Visit: Payer: Medicare Other | Attending: Sports Medicine

## 2012-01-01 DIAGNOSIS — IMO0001 Reserved for inherently not codable concepts without codable children: Secondary | ICD-10-CM | POA: Insufficient documentation

## 2012-01-01 DIAGNOSIS — M2550 Pain in unspecified joint: Secondary | ICD-10-CM | POA: Insufficient documentation

## 2012-01-09 ENCOUNTER — Ambulatory Visit: Payer: Medicare Other

## 2012-01-15 ENCOUNTER — Ambulatory Visit: Payer: Medicare Other | Admitting: Physical Therapy

## 2012-01-17 ENCOUNTER — Encounter: Payer: Self-pay | Admitting: Family Medicine

## 2012-01-17 ENCOUNTER — Ambulatory Visit (INDEPENDENT_AMBULATORY_CARE_PROVIDER_SITE_OTHER): Payer: Medicare Other | Admitting: Family Medicine

## 2012-01-17 VITALS — BP 132/80 | HR 91 | Temp 97.4°F | Ht 64.0 in | Wt 254.1 lb

## 2012-01-17 DIAGNOSIS — F17200 Nicotine dependence, unspecified, uncomplicated: Secondary | ICD-10-CM

## 2012-01-17 DIAGNOSIS — R209 Unspecified disturbances of skin sensation: Secondary | ICD-10-CM

## 2012-01-17 DIAGNOSIS — D509 Iron deficiency anemia, unspecified: Secondary | ICD-10-CM

## 2012-01-17 DIAGNOSIS — F1010 Alcohol abuse, uncomplicated: Secondary | ICD-10-CM

## 2012-01-17 DIAGNOSIS — K219 Gastro-esophageal reflux disease without esophagitis: Secondary | ICD-10-CM

## 2012-01-17 DIAGNOSIS — R202 Paresthesia of skin: Secondary | ICD-10-CM

## 2012-01-17 DIAGNOSIS — M76899 Other specified enthesopathies of unspecified lower limb, excluding foot: Secondary | ICD-10-CM

## 2012-01-17 DIAGNOSIS — M7061 Trochanteric bursitis, right hip: Secondary | ICD-10-CM

## 2012-01-17 LAB — POCT HEMOGLOBIN: Hemoglobin: 8.3 g/dL — AB (ref 12.2–16.2)

## 2012-01-17 NOTE — Patient Instructions (Addendum)
Thank you for coming into clinic today I am concerned about your anemia. Please continue taking your iron pills.  I will call you with your lab results. Please come back to see me in a couple of weeks.  When you come back please also come fasting so we can get a cholesterol level.  Have a great day.

## 2012-01-17 NOTE — Assessment & Plan Note (Signed)
Concern for GI loss, vs malignancy vs anemia of chronic disease. Hgb in office today 8.3, down from previous despite oral iron therapy Anemia panel CBC Hemocult cards Peripheral Smear

## 2012-01-17 NOTE — Assessment & Plan Note (Signed)
Will address again in august

## 2012-01-17 NOTE — Assessment & Plan Note (Addendum)
Scheduled appt to meet w/ clinical nutritionist Rosalita Chessman. CHolesterol panel ordered to be done prior to next appt.

## 2012-01-17 NOTE — Assessment & Plan Note (Signed)
Resolved after PPI therapy. No intervention at this time. Anticipate future flare as pt treated for this in the past.

## 2012-01-17 NOTE — Progress Notes (Signed)
  Subjective:    Patient ID: Kendra [name], female    DOB: April 13, 1966, 46 y.o.   MRN: [id number]  HPI  CC: Anemia, CP, GERD  Anemia: Pt complaining of worsening lightheadedness and dizziness. SYmptoms occure typically when going from sitting/lying to standing. SYmpotms have come and gone in the past but have been present for the past coupld of weeks. Denies hematochezia, hematemesis, hematuria. Periods occur every month w/ only a few days of mild bleeding. Denies fmHx of GI malignancy. Smokes 1.5ppd and drinks at least 1 40 oz beer a day. Endorsed childhood bowel perforation injury, but no problems since initial treatment. Iron study from 2011 reviewed. Pt has been compliant w/ iron and vit c therapy started approximately 1 month ago.   Obesity: Pt w/ poor diet and no exercise. Limited by musc pain. Pt put in touch w/ clinical nutritionist. BMI 43.  CP/GERD: Both are resolved from last appointment. Pt started on 2wk trial of PPI at last appt. Symptoms completely resolved shortly after starting. Denies, CP, heartburn, n/v/d/c, SOB, syncope, palpitations, diaphoresis.   Tobacco/Etoh: Pt continues to smoke 1.5ppd and 1 40oz beer daily. Used to drink significantly more. Knows she is drinking too much and smoking too much. Pt aware of health risks. Pt says she will be ready for interventions in August!  Various musculoskeletal pain issues. Significant improvement since meeting w/ SM. Steroid injection and meloxicam beneficial per pt. Pt scheduled to continue therapy  Review of Systems Per HPI w/ the following additions. Negative wt loss, night sweats    Objective:   Physical Exam VS reviewed Gen: Obese CV: RRR  Res: CTAB Skin: Healing R preauricular scar from cyst removal.       Assessment & Plan:

## 2012-01-17 NOTE — Assessment & Plan Note (Signed)
Address in [employer]

## 2012-01-17 NOTE — Assessment & Plan Note (Signed)
Much improved w/ STeroid shot and PT interventions by SM. Continue

## 2012-01-17 NOTE — Assessment & Plan Note (Signed)
Much improved after meetin w/ SM and PT. Continue

## 2012-01-18 LAB — CBC
Hemoglobin: 8.8 g/dL — ABNORMAL LOW (ref 12.0–15.0)
MCH: 22.5 pg — ABNORMAL LOW (ref 26.0–34.0)
Platelets: 459 10*3/uL — ABNORMAL HIGH (ref 150–400)
RBC: 3.91 MIL/uL (ref 3.87–5.11)
WBC: 5.8 10*3/uL (ref 4.0–10.5)

## 2012-01-18 LAB — PATHOLOGIST SMEAR REVIEW

## 2012-01-18 LAB — ANEMIA PANEL
ABS Retic: 58.7 10*3/uL (ref 19.0–186.0)
Ferritin: 4 ng/mL — ABNORMAL LOW (ref 10–291)
Folate: 6.1 ng/mL
RBC.: 3.91 MIL/uL (ref 3.87–5.11)
Retic Ct Pct: 1.5 % (ref 0.4–2.3)

## 2012-01-22 ENCOUNTER — Ambulatory Visit: Payer: Medicare Other

## 2012-01-24 ENCOUNTER — Ambulatory Visit: Payer: Medicare Other

## 2012-01-25 ENCOUNTER — Encounter: Payer: Self-pay | Admitting: Home Health Services

## 2012-01-25 ENCOUNTER — Ambulatory Visit (INDEPENDENT_AMBULATORY_CARE_PROVIDER_SITE_OTHER): Payer: Medicare Other | Admitting: Home Health Services

## 2012-01-25 VITALS — BP 109/73 | HR 92 | Temp 98.0°F | Ht 64.0 in | Wt 257.8 lb

## 2012-01-25 DIAGNOSIS — Z0000 Encounter for general adult medical examination without abnormal findings: Secondary | ICD-10-CM

## 2012-01-25 NOTE — Progress Notes (Signed)
Patient here for annual wellness visit, patient reports: Risk Factors/Conditions needing evaluation or treatment: Pt drinks in excess alcohol daily.  Pt reports setting a quit date for both alcohol and tobacco for August.  She will schedule an appointment with PCP at that time.  Home Safety: Pt lives by self in 2 story home.  Pt reports having smoke detectors in home.  Other Information: Corrective lens: Pt reports using reading glasses.  Will schedule eye exam with Dr. Lavona Mound over next few weeks. Dentures: Pt does not have dentures. Visits dentist as needed. Memory: Pt denies memory problems. Patient's Mini Mental Score (recorded in doc. flowsheet): 29  Balance/Gait: Pt has pain in left left/hip.  Is currently going to physical therapy.  Balance Abnormal Patient value  Sitting balance    Sit to stand    Attempts to arise    Immediate standing balance    Standing balance    Nudge    Eyes closed- [place]berg    Tandem stance    Back lean    Neck Rotation    360 degree turn    Sitting down     Gait Abnormal Patient value  Initiation of gait    Step length-left x shortened  Step length-right    Step height-left    Step height-right    Step symmetry    Step continuity    Path deviation    Trunk movement  Wide swing/movements  Walking stance        Annual Wellness Visit Requirements Recorded Today In  Medical, family, social history Past Medical, Family, Social History Section  Current providers Care team  Current medications Medications  Wt, BP, Ht, BMI Vital signs  Hearing assessment (welcome visit) Vision/hearing  Tobacco, alcohol, illicit drug use History  ADL Nurse Assessment  Depression Screening Nurse Assessment  Cognitive impairment Nurse Assessment  Mini Mental Status Document Flowsheet  Fall Risk Nurse Assessment  Home Safety Progress Note  End of Life Planning (welcome visit) Social Documentation  Medicare preventative services Progress Note  Risk  factors/conditions needing evaluation/treatment Progress Note  Personalized health advice Patient Instructions, goals, letter  Diet & Exercise Social Documentation  Emergency Contact Social Documentation  Seat Belts Social Documentation  Sun exposure/protection Social Documentation    Prevention Plan:   Recommended Medicare Prevention Screenings Women over 65 Test For Frequency Date of Last- BOLD if needed  Breast Cancer 1-2 years 5/07  Cervical Cancer 1-3 yrs 10/12  Colorectal Cancer 1-10 yrs NI-due to age  Osteoporosis once NI-due to age  Cholesterol 5 yrs dicussed  Diabetes yearly 5/13  HIV yearly declined  Influenza Shot yearly 10/13  Pneumonia Shot once NI-due to age  Zostavax Shot once NI-due to age

## 2012-01-25 NOTE — Patient Instructions (Signed)
1. Schedule breast exam.  2. Schedule eye exam with Dr. Dione Booze 3. Schedule visit with Dr. Konrad Dolores for beginning of August to set quit date. 4. Consider joining [facility] for more daily movement and exercise.

## 2012-01-30 ENCOUNTER — Telehealth: Payer: Self-pay | Admitting: Family Medicine

## 2012-01-31 ENCOUNTER — Other Ambulatory Visit: Payer: Self-pay | Admitting: Family Medicine

## 2012-01-31 ENCOUNTER — Telehealth: Payer: Self-pay | Admitting: Family Medicine

## 2012-01-31 ENCOUNTER — Encounter: Payer: Self-pay | Admitting: Family Medicine

## 2012-01-31 ENCOUNTER — Ambulatory Visit (INDEPENDENT_AMBULATORY_CARE_PROVIDER_SITE_OTHER): Payer: Medicare Other | Admitting: Family Medicine

## 2012-01-31 ENCOUNTER — Other Ambulatory Visit (HOSPITAL_COMMUNITY)
Admission: RE | Admit: 2012-01-31 | Discharge: 2012-01-31 | Disposition: A | Discharge status: Home / Self Care | Payer: Medicare Other | Source: Ambulatory Visit | Attending: Family Medicine | Admitting: Family Medicine

## 2012-01-31 VITALS — BP 136/81 | HR 74 | Temp 98.0°F | Ht 64.0 in | Wt 256.0 lb

## 2012-01-31 DIAGNOSIS — Z113 Encounter for screening for infections with a predominantly sexual mode of transmission: Secondary | ICD-10-CM | POA: Insufficient documentation

## 2012-01-31 DIAGNOSIS — R300 Dysuria: Secondary | ICD-10-CM

## 2012-01-31 DIAGNOSIS — Z1231 Encounter for screening mammogram for malignant neoplasm of breast: Secondary | ICD-10-CM

## 2012-01-31 DIAGNOSIS — N760 Acute vaginitis: Secondary | ICD-10-CM

## 2012-01-31 LAB — POCT URINALYSIS DIPSTICK
Bilirubin, UA: NEGATIVE
Ketones, UA: NEGATIVE
Nitrite, UA: NEGATIVE
pH, UA: 5.5

## 2012-01-31 LAB — POCT WET PREP (WET MOUNT)

## 2012-01-31 LAB — POCT UA - MICROSCOPIC ONLY: Epithelial cells, urine per micros: >20

## 2012-01-31 MED ORDER — METRONIDAZOLE 500 MG PO TABS: 500.0000 mg | ORAL_TABLET | Freq: Two times a day (BID) | ORAL | Status: AC

## 2012-01-31 MED ORDER — METRONIDAZOLE 500 MG PO TABS: 500.0000 mg | ORAL_TABLET | Freq: Two times a day (BID) | ORAL | Status: DC

## 2012-01-31 NOTE — Telephone Encounter (Signed)
Called to let her know she has trich.  Called in rx for her and boyfriend.  She expresses understaning

## 2012-01-31 NOTE — Assessment & Plan Note (Signed)
Trichomonas on Urine and wet prep.  Will call in treatment for that for her and boyfriend.

## 2012-01-31 NOTE — Patient Instructions (Signed)
I have sent your swabs to the lab I will have to call you with the results either this afternoon or tomorrow

## 2012-01-31 NOTE — Progress Notes (Signed)
  Subjective:    Patient ID: Kendra [name], female    DOB: [birth year], 46 y.o.   MRN: [id number]  HPI Patient with burning on urination as well as pain with sexual intercourse x4 days. She has a vaginal odor as well as discharge. The discharge is described as thin and yellow. She says that she is having course with only one partner and is monogamous relationship. She has a BTL for contraception.   Review of Systems No N/V/D    Objective:   Physical Exam  Vital signs reviewed General appearance - alert, well appearing, and in no distress Abdomen - soft, nontender, nondistended, no masses or organomegaly GYN- external genetalia normal, without lesions.  Vagina normal color, rugations, with thin green discharge.  Cervix normal color without lesions or discharge, no CMT. Uterus normal size      Assessment & Plan:

## 2012-02-04 ENCOUNTER — Ambulatory Visit: Payer: Medicare Other | Attending: Sports Medicine

## 2012-02-04 DIAGNOSIS — IMO0001 Reserved for inherently not codable concepts without codable children: Secondary | ICD-10-CM | POA: Insufficient documentation

## 2012-02-04 DIAGNOSIS — M2550 Pain in unspecified joint: Secondary | ICD-10-CM | POA: Insufficient documentation

## 2012-02-11 ENCOUNTER — Ambulatory Visit: Payer: Medicare Other

## 2012-02-12 ENCOUNTER — Ambulatory Visit: Payer: Medicare Other

## 2012-02-13 ENCOUNTER — Ambulatory Visit: Payer: Medicare Other

## 2012-02-18 ENCOUNTER — Ambulatory Visit: Payer: Medicare Other

## 2012-02-26 ENCOUNTER — Ambulatory Visit: Payer: Medicare Other | Admitting: Family Medicine

## 2012-03-03 ENCOUNTER — Ambulatory Visit: Payer: Medicare Other | Attending: Sports Medicine

## 2012-03-03 DIAGNOSIS — M2550 Pain in unspecified joint: Secondary | ICD-10-CM | POA: Insufficient documentation

## 2012-03-03 DIAGNOSIS — IMO0001 Reserved for inherently not codable concepts without codable children: Secondary | ICD-10-CM | POA: Insufficient documentation

## 2012-03-05 ENCOUNTER — Ambulatory Visit: Payer: Medicare Other | Admitting: Family Medicine

## 2012-03-13 ENCOUNTER — Ambulatory Visit (INDEPENDENT_AMBULATORY_CARE_PROVIDER_SITE_OTHER): Payer: Medicare Other | Admitting: Sports Medicine

## 2012-03-13 ENCOUNTER — Encounter: Payer: Self-pay | Admitting: Sports Medicine

## 2012-03-13 VITALS — BP 116/70 | HR 92 | Ht 64.0 in | Wt 256.0 lb

## 2012-03-13 DIAGNOSIS — M542 Cervicalgia: Secondary | ICD-10-CM

## 2012-03-13 DIAGNOSIS — M533 Sacrococcygeal disorders, not elsewhere classified: Secondary | ICD-10-CM

## 2012-03-13 MED ORDER — TRAMADOL HCL 50 MG PO TABS
50.0000 mg | ORAL_TABLET | Freq: Every evening | ORAL | Status: DC | PRN
Start: 2012-03-13 — End: 2012-05-14

## 2012-03-13 MED ORDER — PREDNISONE 10 MG PO KIT: PACK | ORAL | Status: DC

## 2012-03-13 NOTE — Patient Instructions (Addendum)
Thank you for coming today. You will start taking the following medications: 1. Prednisone for 12 days as indicated in prescription. 2. Ultram is a medication you can use at night for pain as needed.  You will need an MRI of the neck that will be coordinated for you today. A follow up appointment should be scheduled after MRI is completed.   MRI APPT IS SUN 7.21.13 @[address]  [address] [place] [facility] AT [address] FOR REGISTRATION [phone]

## 2012-03-13 NOTE — Progress Notes (Signed)
I have reviewed this visit and discussed with Suzanne Lineberry and agree with her documentation  

## 2012-03-13 NOTE — Progress Notes (Signed)
  Subjective:    Patient ID: Kendra [name], female    DOB: 1966/07/22, 46 y.o.   MRN: [id number]  HPI Pt that comes today for follow up appointment of right shoulder and right hip pain. She received a total of 4 weeks physical therapy and pt did not notice any improvement. She is not taking any medication at this time.  1. Right shoulder pain: it is described in all shoulder with radiation to the back. There is numbness of all right arm and fingers present only if pt lays on right lateral decubitus. Not in an specific dermatome distribution. No weakness noticed. Pain is present at rest or when moving the joint. No edema or erythema noted.  2. Right hip pain: it is located towards the back of her hip, no radiation to lower extremity. No tingling or numbing sensation, no changes on extremity strength noticed. aggravated by same position described above. Pain at rest and while walking. Reports mld intermittent limping. No fecal or urinary incontinence reported.  Review of Systems Per HPI.    Objective:   Physical Exam Consitutional: NAD. MSS:   Right shoulder: Active ROM limited by pain, passive ROM WNL. Tender to palpation in every site I pressed. All maneuvers to explore shoulder function were limited by pain. Reflexes symmetrically diminished. 5/5 strength.  Right Hip: Pasive ROM WNL. Pain release with internal as well external rotation of hip joint. Tenderness present mostly on SI joint. Negative straight leg raise maneuver. Reflexes symmetrically diminished. 5/5 strength.   Assessment & Plan:  #1. Persistent right-sided neck pain-rule out cervical disc herniation #2. 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 I will call her with those results once available. In the meantime, she did note some improvement with a 6 day Sterapred Dosepak. I will repeat this treatment, at this time we'll give a 12  day Dosepak. Ultram each bedtime as needed for pain. I may ultimately refer the patient to Dr. Maurice Small for further treatment on her neck as well as a possible fluoroscopically guided injection into the right SI joint (her body habitus prevents [place] from doing an ultrasound guided injection). Also recommended that she discuss with her PCP the possibility of fibromyalgia.

## 2012-03-14 ENCOUNTER — Ambulatory Visit (INDEPENDENT_AMBULATORY_CARE_PROVIDER_SITE_OTHER): Payer: Medicare Other | Admitting: Family Medicine

## 2012-03-14 ENCOUNTER — Encounter: Payer: Self-pay | Admitting: Family Medicine

## 2012-03-14 VITALS — BP 112/63 | HR 82 | Ht 64.0 in | Wt 250.0 lb

## 2012-03-14 DIAGNOSIS — D509 Iron deficiency anemia, unspecified: Secondary | ICD-10-CM

## 2012-03-14 DIAGNOSIS — K219 Gastro-esophageal reflux disease without esophagitis: Secondary | ICD-10-CM

## 2012-03-14 DIAGNOSIS — R109 Unspecified abdominal pain: Secondary | ICD-10-CM

## 2012-03-14 LAB — CBC
HCT: 30.2 % — ABNORMAL LOW (ref 36.0–46.0)
Hemoglobin: 8.7 g/dL — ABNORMAL LOW (ref 12.0–15.0)
MCH: 22.1 pg — ABNORMAL LOW (ref 26.0–34.0)
MCHC: 28.8 g/dL — ABNORMAL LOW (ref 30.0–36.0)
RBC: 3.93 MIL/uL (ref 3.87–5.11)

## 2012-03-14 MED ORDER — POLYETHYLENE GLYCOL 3350 17 GM/SCOOP PO POWD: 17.0000 g | Freq: Two times a day (BID) | ORAL | Status: AC | PRN

## 2012-03-14 NOTE — Patient Instructions (Signed)
Think it using the clinic today. The lifting of the results of your labwork today. Pacer taking MiraLax twice a day until you start having regular bowel movements. Please increase the water intake in your daily diet. Please let me know if your abdominal pain gets significantly worse or she develops fevers. Please come back and see me in one month so we can talk about your alcohol and tobacco use. Please continue to follow up with sports medicine. Please decrease your Prilosec use to 1 tablet a day for one week and then to every other day for one week and then stop taking the medication completely.

## 2012-03-16 ENCOUNTER — Ambulatory Visit
Admission: RE | Admit: 2012-03-16 | Discharge: 2012-03-16 | Disposition: A | Discharge status: Home / Self Care | Payer: Medicare Other | Source: Ambulatory Visit | Attending: Sports Medicine | Admitting: Sports Medicine

## 2012-03-16 DIAGNOSIS — M542 Cervicalgia: Secondary | ICD-10-CM

## 2012-03-17 ENCOUNTER — Telehealth: Payer: Self-pay | Admitting: Sports Medicine

## 2012-03-17 NOTE — Telephone Encounter (Signed)
I spoke with the patient today on the phone regarding MRI findings of her cervical spine. She has multilevel degenerative changes with areas of spinal stenosis particularly on the right. She is doing better on a 12 day Sterapred Dosepak. I've asked her to take that Dosepak to completion and call me if symptoms persist thereafter at which point I may consider referral to Dr. Maurice Small for consideration of epidural versus facet injections.

## 2012-03-18 ENCOUNTER — Encounter: Payer: Self-pay | Admitting: Family Medicine

## 2012-03-18 NOTE — Assessment & Plan Note (Signed)
Repeat CBC today. Patient to take home Hemoccult cards. No obvious signs of blood loss.

## 2012-03-18 NOTE — Assessment & Plan Note (Signed)
Currently resolved. Patient to decrease Prilosec to daily for the next week and then QOD for the next week and then to stop. Patient to monitor for recurrence of symptoms and modify diet first and foremost and titrate back omeprazole dosing when necessary.

## 2012-03-18 NOTE — Assessment & Plan Note (Addendum)
Constipation versus musculoskeletal versus kidney stones. Patient now on tramadol. Patient to start taking MiraLax and increasing fluid intake. Red flags discussed. Patient to call if symptoms worsen or do not improve.

## 2012-03-18 NOTE — Progress Notes (Signed)
  Subjective:    Patient ID: Kendra [name], female    DOB: May 11, 1966, 46 y.o.   MRN: [id number]  HPI CC: L Side pain, Neck pain, Fatigue  Fatigue: Patient undergoing workup for cause of anemia. Patient reports constant fatigue. Denies orthostasis. Denies hematuria, hematemesis, hematochezia, syncope, lightheadedness. Menstrual periods every 28 days with 5 days of total menses 2 days of heavy bleeding. Bilateral tubal ligation 1993. Patient has been compliant on oral iron and vitamin C supplements since last appointment. Patient has not done Hemoccult cards at this time. Denies fever, weight loss, night sweats.  Left-sided pain: Onset yesterday. Sudden and sharp in nature patient unable to lay on left side. Started in the morning upon waking. Movement makes it worse. Improves with rest. Denies previous injury or surgery. Patient has been constipated with no bowel movement for the past 3 days. Patient's normal bowel habits are daily. No history of kidney stones. No dysuria.  Neck pain/right thigh pain: Patient continues to meet with sports medicine physicians and is compliant with PT and exercises. Little to no benefit with rehabilitation. Upcoming MR. Some relief with pain pills.  Acid reflux: Patient has been taking Prilosec twice a day for several months now with complete resolution of reflux symptoms. Denies reflux, dysphasia. Patient aware of foods and habits that trigger symptoms. Patient amenable to decreasing dosing.  Review of Systems Per history of present illness    Objective:   Physical Exam   General: No acute distress, obese Abdominal: Normal active bowel sounds, soft, nontender to light and moderate to palpation, no tenderness tenderness, stool felt in left lower quadrant, some discomfort with deep palpation of stool. McBurney's negative, no rebound     Assessment & Plan:

## 2012-03-19 ENCOUNTER — Ambulatory Visit: Payer: Medicare Other | Admitting: Family Medicine

## 2012-04-04 ENCOUNTER — Encounter: Payer: Self-pay | Admitting: Family Medicine

## 2012-04-04 ENCOUNTER — Ambulatory Visit (INDEPENDENT_AMBULATORY_CARE_PROVIDER_SITE_OTHER): Payer: Medicare Other | Admitting: Family Medicine

## 2012-04-04 VITALS — BP 118/70 | HR 70 | Temp 98.0°F | Ht 64.0 in | Wt 227.0 lb

## 2012-04-04 DIAGNOSIS — R109 Unspecified abdominal pain: Secondary | ICD-10-CM

## 2012-04-04 MED ORDER — IBUPROFEN 800 MG PO TABS: 800.0000 mg | ORAL_TABLET | Freq: Three times a day (TID) | ORAL | Status: AC

## 2012-04-04 NOTE — Progress Notes (Signed)
Patient ID: Kendra [name], female   DOB: [birth year], 46 y.o.   MRN: [id number] Subjective: The patient is a 46 y.o. year old female who presents today for side pain.  1. Left side pain:  x1 month.  Just started one morning, no obvious inciting factor.  Is there all the time.  Sharp, constant.  Has tried tylenol, other pain medication.  None are helpful.  Getting up and lying down makes worse.  No n/v/d.  No difficulty breathing.  Is located in the abd wall of the LLQ to left middle.  None in back.  No dysuria.  Patient's past medical, social, and family history were reviewed and updated as appropriate. History  Substance Use Topics  . Smoking status: Current Everyday Smoker -- 1.0 packs/day    Types: Cigarettes  . Smokeless tobacco: Never Used  . Alcohol Use: 0.0 oz/week     2 -40oz daily   Objective:  Filed Vitals:   04/04/12 1002  BP: 118/70  Pulse: 70  Temp: 98 F (36.7 C)   Gen: NAD Abd: Obese, there are no obvious deformities.  There are no hernias detected on exam.  There is mild tenderness ot palpation in the panace on the left side and mild diffuse tenderness on the left side of the abdomen.  There are no skin changes and no erythema/warmth.  Assessment/Plan:  Please also see individual problems in problem list for problem-specific plans.

## 2012-04-04 NOTE — Patient Instructions (Signed)
It was good to see you today! I want you to take high dose motrin for the next 2 weeks.  You will be taking it three times per day with food.  If you are  Not doing better by the end of two weeks, come back to see [place].  We will discuss some type of x-rays then if you are not doing better.

## 2012-04-05 NOTE — Assessment & Plan Note (Signed)
Unclear etiology. Considerations are pelvic pathology, fat necrosis, muscle strain.  Will give it some additional time to heal and treat with NSAIDs.  If not better in 2-3 weeks would consider imaging.  Likely begin with CT abd/pelvis and might move on to transvaginal US depending on results.  Patient currently does not have any concerning symptoms so I do not feel there is any need for more acute intervention.

## 2012-04-15 ENCOUNTER — Telehealth: Payer: Self-pay | Admitting: Family Medicine

## 2012-04-15 NOTE — Telephone Encounter (Signed)
EMERGENCY LINE CALL:  Patient calls because stomach has been hurting after eating for the past 3 days.  + vomiting today and yesterday, non-bloody. + diarrhea.  No fevers/chills.  Has had decreased po- food and drink because of pain.  Is still urinating.  Pain is present all the time, but worse with eating/drinking.   Pt was seen 04/04/12 for similar; CT scan was discussed as next step in work up if pt no better in 2-3 weeks.  Advised pt that she should come to the ED tonight if the pain becomes severe, or she starts having bloody emesis or diarrhea, dizziness, SOB, or CP.  Otherwise, she can be seen in the [facility] clinic for evaluation.  Patient stated she thinks she can wait until tomorrow AM to be seen but understands red flags to come in for tonight.

## 2012-04-17 ENCOUNTER — Encounter: Payer: Self-pay | Admitting: Family Medicine

## 2012-04-17 ENCOUNTER — Ambulatory Visit (HOSPITAL_COMMUNITY)
Admission: RE | Admit: 2012-04-17 | Discharge: 2012-04-17 | Disposition: A | Discharge status: Home / Self Care | Payer: Medicare Other | Source: Ambulatory Visit | Attending: Family Medicine | Admitting: Family Medicine

## 2012-04-17 ENCOUNTER — Ambulatory Visit (INDEPENDENT_AMBULATORY_CARE_PROVIDER_SITE_OTHER): Payer: Medicare Other | Admitting: Family Medicine

## 2012-04-17 VITALS — BP 114/79 | HR 97 | Temp 97.9°F | Ht 64.0 in | Wt 248.0 lb

## 2012-04-17 DIAGNOSIS — R109 Unspecified abdominal pain: Secondary | ICD-10-CM

## 2012-04-17 DIAGNOSIS — K219 Gastro-esophageal reflux disease without esophagitis: Secondary | ICD-10-CM

## 2012-04-17 DIAGNOSIS — R079 Chest pain, unspecified: Secondary | ICD-10-CM

## 2012-04-17 DIAGNOSIS — D509 Iron deficiency anemia, unspecified: Secondary | ICD-10-CM

## 2012-04-17 DIAGNOSIS — A048 Other specified bacterial intestinal infections: Secondary | ICD-10-CM

## 2012-04-17 LAB — POCT H PYLORI SCREEN: H Pylori Screen, POC: POSITIVE

## 2012-04-17 MED ORDER — CYCLOBENZAPRINE HCL 5 MG PO TABS: 5.0000 mg | ORAL_TABLET | Freq: Three times a day (TID) | ORAL | Status: AC | PRN

## 2012-04-17 MED ORDER — VITAMIN C 500 MG PO TABS: 500.0000 mg | ORAL_TABLET | Freq: Every day | ORAL | Status: AC

## 2012-04-17 MED ORDER — AMOXICILLIN 500 MG PO CAPS: 1000.0000 mg | ORAL_CAPSULE | Freq: Two times a day (BID) | ORAL | Status: AC

## 2012-04-17 MED ORDER — ONDANSETRON HCL 4 MG PO TABS: 4.0000 mg | ORAL_TABLET | Freq: Three times a day (TID) | ORAL | Status: AC | PRN

## 2012-04-17 MED ORDER — FERROUS SULFATE 220 (44 FE) MG/5ML PO ELIX: 220.0000 mg | ORAL_SOLUTION | Freq: Two times a day (BID) | ORAL | Status: DC

## 2012-04-17 MED ORDER — CLARITHROMYCIN 500 MG PO TABS: 500.0000 mg | ORAL_TABLET | Freq: Two times a day (BID) | ORAL | Status: AC

## 2012-04-17 MED ORDER — FERROUS SULFATE 220 (44 FE) MG/5ML PO ELIX: 220.0000 mg | ORAL_SOLUTION | Freq: Every day | ORAL | Status: DC

## 2012-04-17 NOTE — Assessment & Plan Note (Signed)
2 components to this.   Epigastric: likely due to H.Pylori infection. Pt w/ h/o ETOH abuse and smoking and Reflux w/ recent decrease in PPI over past mo w/ h/o gastric ulcer. No evidence of active bleed.   LLQ: Likely musculoskeletal. Stretching, flexeril, NSAIDs

## 2012-04-17 NOTE — Assessment & Plan Note (Addendum)
See h.pylori. Pt to likely benefit from lifetime PPI at this point. Will not decrease dose in future Likely component of CP as EKG normal.

## 2012-04-17 NOTE — Assessment & Plan Note (Signed)
GI intolerance to Fe tab. Start iron solution BID (44mg  elemental iron per dose), Start Vit C 500mg  tab. Hemocult cards. Pt to bring in to clinic this time after finishing

## 2012-04-17 NOTE — Progress Notes (Signed)
  Subjective:    Patient ID: Kendra [name], female    DOB: 10-Feb-1966, 46 y.o.   MRN: [id number]  HPI CC: abd pain, chest pain, emesis  Abd Pain: pain in two locations. LLQ pain persistent for greater than 1 month. Worse w/ movement. Comes and goes. Daily BM. Multiple per day when on miralax. Denies hematochezia, diarrhea, constipation.  Epigastric pain for approximately 5-7 days. Worse w/ food. H/o peptic ulcer. Denies hematemesis. Continues to smoke and drink. Has not tried any meds for relief. Decreased omeprazole to Qday at last appt. Emesis for past 4 days. Denies sick contacts  Anemia: Continues to be tired and w/ a lack of energy. Minimal bleeding during periods which have started becoming irregular again. Mother went through menopause 5 years younger than pts current age. Intolerant to iron pills over the last couple of weeks. Cause nausea and pt emesis. Denis any hematuria, hematochezia, hematemesis  Chest pain: Started 5 days ago (same time as the nausea). Reproduced on palpation of chest and deep inspiration. No alleviating factors. Worsens after meals.   Tobacco abuse: Pt down to 6 cigs daily. Will make f/u appt to start intervention   Review of Systems Per hpi    Objective:   Physical Exam Gen: Obese. Mil distress Abd: Mild pain on deep palpation of LLQ that is worse w/ certain movements. Epigastric tenderness. Musc: Pain on palpation of chest wall. CV: RRR, no m/r/g       Assessment & Plan:

## 2012-04-17 NOTE — Patient Instructions (Addendum)
Thank you for coming in today You have a bacterial infection in your stomach Please start taking amoxicillina dn clarithromycin as directed. Please increase your Omeprazole to 20 mg twice a day Please come back to see me in 2 weeks if your pain does not improve.  Please start taking flexeril as needed for muscle pain/tightness. Please bring back the stool cards to the office once you have completed them.  Have a great day.  Helicobacter Pylori and Ulcer Disease An ulcer may be in your stomach (gastric ulcer) or in the first part of your small bowel, which is called the duodenum (duodenal ulcer). An ulcer is a break in the stomach or duodenum lining. The break wears down into the deeper tissue. Helicobacter pylori (H. pylori) is a type of germ (bacteria) that may cause the majority of gastric or duodenal ulcers. CAUSES   A germ (bacterium). H. pylori can weaken the protective mucous coating of the stomach and duodenum. This allows acid to get through to the sensitive lining of the stomach or duodenum and an ulcer can then form.   Certain medications.   Using substances that can bother the lining of the stomach (alcohol, tobacco or medications such as Advil or Motrin) in the presence of H.pylori infection. This can increase the chances of getting an ulcer.   Cancer (rarely).  Most people infected with H. pylori do not get ulcers. It is not known how people catch H. pylori. It may be through food or water. H. pylori has been found in the saliva of some infected people. Therefore, the bacteria may also spread through mouth-to-mouth contact such as kissing. SYMPTOMS  The problems (symptoms) of ulcer disease are usually:  A burning or gnawing of the mid-upper belly (abdomen). This is often worse on an empty stomach. It may get better with food. This may be associated with feeling sick to your stomach (nausea), bloating and vomiting.   If the ulcer results in bleeding, it can cause:   Black,  tarry stools.   Throwing up bright red blood.   Throwing up coffee ground looking materials.  With severe bleeding, there may be loss of consciousness and shock. Besides ulcer disease, H. pylori can also cause chronic gastritis (irritation of the lining of the stomach without ulcer) or stomach acid-type discomfort. You may not have symptoms even though you have an H. pylori infection. Although this is an infection, you may not have usual infection symptoms (such as fever). DIAGNOSIS  Ulcer disease can be diagnosed in many different ways. If you have an ulcer, it is important to know whether or not it is caused by H. Pylori. Treatment for an ulcer caused by H. pylori is different from that for an ulcer with other causes. The best way to detect H. pylori is taking tissue directly from the ulcer during an endoscopy test.   An endoscopy is an exam that uses an endoscope. This is a thin, lighted tube with a small camera on the end. It is like a flexible telescope. The patient is given a drug to make them calm (sedative). The caregiver eases the endoscope into the mouth and down the throat to the stomach and duodenum. This allows the doctor to see the lining of the esophagus, stomach and duodenum.   If an endoscopy is not needed, then H. pylori can be detected with tests of the blood, stool or even breath.  TREATMENT   H. pylori peptic ulcer treatment usually involves a combination of:  Medicines that kill germs (antibiotics).   Acid suppressors.   Stomach protectors.   The use of only one medication to treat H. pylori is not recommended. The most proven treatment is a 2 week course of treatment called triple therapy. It involves taking two antibiotics to kill the bacteria and either an acid suppressor or stomach-lining shield. Two-week triple therapy reduces ulcer symptoms, kills the bacteria, and prevents ulcers from coming back in many patients.   Unfortunately, patients may find triple  therapy hard to do. This is because it involves taking as many as 20 pills a day. Also, the antibiotics used in triple therapy may cause mild side effects. These include nausea, vomiting, diarrhea, dark stools, a metallic taste in the mouth, dizziness, headache and yeast infections in women. Talk to your caregiver if you have any of these side effects.  HOME CARE INSTRUCTIONS   Take your medications as directed and for as long as prescribed. Contact your caregiver if you have problems or side effects from your medications.   Continue regular work and usual activities unless told otherwise by your caregiver.   Avoid tobacco, alcohol and caffeine. Tobacco use will decrease and slow healing.   Avoid medications that are harmful. This includes aspirin and NSAIDS such as ibuprofen and naproxen.   Avoid foods that seem to aggravate or cause discomfort.   There are many over-the-counter products available to control stomach acid and other symptoms. Discuss these with your caregiver before using them. Do not  stop taking prescription medications for over-the-counter medications without talking with your caregiver.   Special diets are not usually needed.   Keep any follow-up appointments and blood tests as directed.  SEEK MEDICAL CARE IF:   Your pain or other ulcer symptoms do not improve within a few days of starting treatment.   You develop diarrhea. This can be a problem related to certain treatments.   You have ongoing indigestion or heartburn even if your main ulcer symptoms are improved.   You think you have any side effects from your medications or if you do not understand how to use your medications right.  SEEK IMMEDIATE MEDICAL CARE IF:  Any of the following happen:  You develop bright red, rectal bleeding.   You develop dark black, tarry stools.   You throw up (vomit) blood.   You become light-headed, weak, have fainting episodes, or become sweaty, cold and clammy.   You have  severe abdominal pain not controlled by medications. Do not take pain medications unless ordered by your caregiver.  MAKE SURE YOU:   Understand these instructions.   Will watch your condition.   Will get help right away if you are not doing well or get worse.  Document Released: 11/03/2003 Document Revised: 08/02/2011 Document Reviewed: 04/01/2008 [facility] Patient Information 2012 [place], [place].

## 2012-04-17 NOTE — Assessment & Plan Note (Signed)
AB positive lab today. CLarithro, amox, and  Increase PPI for 2 wks. Reevaluate in 2 wks

## 2012-04-17 NOTE — Assessment & Plan Note (Signed)
Multifactorial. GERD w/ musculoskeletal. EKG normal. Reportducible on palpation. No further workup

## 2012-04-18 ENCOUNTER — Telehealth: Payer: Self-pay | Admitting: *Deleted

## 2012-04-18 NOTE — Telephone Encounter (Signed)
PA required for ondansetron. Form placed in MD box.  

## 2012-05-01 ENCOUNTER — Ambulatory Visit: Payer: Medicare Other | Admitting: Family Medicine

## 2012-05-07 ENCOUNTER — Ambulatory Visit: Payer: Medicare Other | Admitting: Family Medicine

## 2012-05-14 ENCOUNTER — Ambulatory Visit (INDEPENDENT_AMBULATORY_CARE_PROVIDER_SITE_OTHER): Payer: Medicare Other | Admitting: Family Medicine

## 2012-05-14 VITALS — BP 150/84 | Ht 64.0 in | Wt 240.0 lb

## 2012-05-14 DIAGNOSIS — M7061 Trochanteric bursitis, right hip: Secondary | ICD-10-CM | POA: Insufficient documentation

## 2012-05-14 DIAGNOSIS — M25569 Pain in unspecified knee: Secondary | ICD-10-CM

## 2012-05-14 DIAGNOSIS — M25561 Pain in right knee: Secondary | ICD-10-CM

## 2012-05-14 DIAGNOSIS — M5090 Cervical disc disorder, unspecified, unspecified cervical region: Secondary | ICD-10-CM

## 2012-05-14 DIAGNOSIS — M76899 Other specified enthesopathies of unspecified lower limb, excluding foot: Secondary | ICD-10-CM

## 2012-05-14 DIAGNOSIS — M1710 Unilateral primary osteoarthritis, unspecified knee: Secondary | ICD-10-CM | POA: Insufficient documentation

## 2012-05-14 MED ORDER — TRAMADOL HCL 50 MG PO TABS: 50.0000 mg | ORAL_TABLET | Freq: Every evening | ORAL | Status: DC | PRN

## 2012-05-14 NOTE — Progress Notes (Signed)
Kendra [name] is a 46 y.o. female who presents to [facility] today for  1) Neck Pain: Present for quite some time.  Previously has been evaluated with an MRI scan which shows multilevel degeneration with severe right stenosis at C4 level.   She has pain radiating to the trapezius,  scapula and down the right arm. She has had a trial of physical therapy which was not helpful. Additionally she notes that tramadol is helpful somewhat.  She denies any weakness or numbness into her hand her arms nor any difficulty walking or bowel or bladder dysfunction.   2) right lateral hip pain:  Patient has a history of greater trochanteric bursitis on the right side. She previously had a greater trochanteric injection which was helpful. Additionally she has been doing some physical therapy and home exercises for this. She notes that the abduction exercises are painful.  She notes that her pain is bothersome especially when she lies on the right side at night and when rising from a seated position.  She denies any weakness or numbness into her right leg.   3) bilateral anterior knee pain:  Present for several years worsening now. She denies any injury. She notes anterior knee pain especially when rising from a seated position and climbing stairs.  She denies any locking catching swelling or giving way.    PMH reviewed. Significant for obesity History  Substance Use Topics  . Smoking status: Current Every Day Smoker    Types: Cigarettes  . Smokeless tobacco: Never Used   Comment: cut back to 6-7 cigs per day from 1ppd  . Alcohol Use: 0.0 oz/week     2 -40oz daily   ROS as above otherwise neg   Exam:  BP 150/84  Ht 5\' 4"  (1.626 m)  Wt 240 lb (108.863 kg)  BMI 41.20 kg/m2 Gen: Well NAD MSK: Neck:  Nontender over spinal midline. Normal neck motion. Negative Spurling's test.  Right hip: Normal-appearing tender over right greater trochanter.  Normal motion and strength with the exception of abduction which is  4/5. Knee bilaterally: No effusions. 1+ patellar crepitations on extension. Positive patellar grind. Negative patellar apprehension. Range of motion is from 0-120. Negative Lachman's, McMurray's, valgus and varus stress.  Neuro: Alert and oriented sensation is intact in all extremities.   MRI of C-spine from July 2013 reviewed  Procedure note right greater trochanteric bursa injection: Consent obtained and timeout performed.  Patient laying on side with affected hip up.   Area located and marked.   Skin cleaned with Betadine, and cold spray applied Using a spinal needle the greater trochanteric bursa was accessed and the needle tip was touch to the femur.   40 mg of Depo-Medrol and 4 mL of Marcaine were injected into a wheel pattern.  Patient tolerated procedure well with no weakness or numbness or bleeding.

## 2012-05-14 NOTE — Assessment & Plan Note (Signed)
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 This has failed conservative therapy including physical therapy. Plan to refer to Dr. Chong Sicilian for consultation and consideration of guided epidural steroid injection series.   She will followup with me after these are completed. If this is not effective would refer to neurosurgery for evaluation for surgical repair.   Discussed warning signs or symptoms. Please see discharge instructions. Patient expresses understanding.

## 2012-05-14 NOTE — Assessment & Plan Note (Signed)
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   Plan to initiate workup with 4 view bilateral knee x-ray.  Followup in my clinic in about 4 weeks.

## 2012-05-14 NOTE — Assessment & Plan Note (Signed)
Patient with right greater trochanteric bursitis. This previously has benefited from steroid injections. Plan to perform unguided corticosteroid injection today with continuation of home physical therapy exercises.  Followup in 4 weeks. Discussed warning signs or symptoms. Please see discharge instructions. Patient expresses understanding.

## 2012-05-14 NOTE — Patient Instructions (Addendum)
Thank you for coming in today. Please follow up with Dr. Chong Sicilian. They should call you.  See me in about 4 weeks to check on your knees and hip.  Call or go to the ER if you develop a large red swollen joint with extreme pain or oozing puss.  Come back or go to the emergency room if you notice new weakness new numbness problems walking or bowel or bladder problems. Continue those hip exercises

## 2012-05-15 LAB — HEMOCCULT GUIAC POC 1CARD (OFFICE)
Card #2 Fecal Occult Blod, POC: NEGATIVE
Card #3 Fecal Occult Blood, POC: NEGATIVE
Fecal Occult Blood, POC: NEGATIVE

## 2012-05-16 ENCOUNTER — Ambulatory Visit
Admission: RE | Admit: 2012-05-16 | Discharge: 2012-05-16 | Disposition: A | Discharge status: Home / Self Care | Payer: Medicare Other | Source: Ambulatory Visit | Attending: Sports Medicine | Admitting: Sports Medicine

## 2012-05-16 DIAGNOSIS — M25562 Pain in left knee: Secondary | ICD-10-CM

## 2012-05-16 DIAGNOSIS — M25561 Pain in right knee: Secondary | ICD-10-CM

## 2012-05-16 NOTE — Addendum Note (Signed)
Addended by: Rodolph Bong on: 05/16/2012 10:39 AM   Modules accepted: Orders

## 2012-05-16 NOTE — Addendum Note (Signed)
Addended by: [place], Sheranda Seabrooks on: 05/16/2012 03:42 PM   Modules accepted: Orders

## 2012-06-11 ENCOUNTER — Ambulatory Visit (INDEPENDENT_AMBULATORY_CARE_PROVIDER_SITE_OTHER): Payer: Medicare Other | Admitting: Family Medicine

## 2012-06-11 VITALS — BP 124/80 | Ht 64.0 in | Wt 248.0 lb

## 2012-06-11 DIAGNOSIS — M25569 Pain in unspecified knee: Secondary | ICD-10-CM

## 2012-06-11 DIAGNOSIS — M25561 Pain in right knee: Secondary | ICD-10-CM

## 2012-06-11 NOTE — Patient Instructions (Addendum)
Thank you for coming in today. You have arthritis in both knees but the left is worse.  You may have a meniscus (bumber cushion cartilage) tear in your left knee.  If the shot does not work we will do a MRI with a plan for surgery.  Please call and let me know if the shot does not work after 2 weeks and we will schedule the MRI.  Call or go to the ER if you develop a large red swollen joint with extreme pain or oozing puss.

## 2012-06-12 NOTE — Progress Notes (Signed)
Kendra [name] is a 46 y.o. female who presents to [facility] today for  1) bilateral knee pain left worse than right: Present for quite some time.  She was seen last month for trochanteric bursitis and cervical pain.  She had a bilateral standing knee x-ray series that showed significant DJD of the medial compartment of the left knee.  Patient is currently exercising trying to lose weight and taking over-the-counter pain medications.  She denies any locking or catching or giving way.  She denies any swelling radiating pain weakness or numbness.  Activity worsens her pain and rest improves her pain.   2) cervical pain:  Patient had fluoroscopic guided epidural steroid injection which has resolved her pain.  She feels well 3) right greater trochanteric bursitis: Much improved following injection.  Patient continues with her stretching and exercises   PMH reviewed. Significant for obesity History  Substance Use Topics  . Smoking status: Current Every Day Smoker    Types: Cigarettes  . Smokeless tobacco: Never Used   Comment: cut back to 6-7 cigs per day from 1ppd  . Alcohol Use: 0.0 oz/week     2 -40oz daily   ROS as above otherwise neg   Exam:  BP 124/80  Ht 5\' 4"  (1.626 m)  Wt 248 lb (112.492 kg)  BMI 42.57 kg/m2 Gen: Well NAD MSK: Left knee: Normal-appearing mild effusion range of motion from 0-120 1+ patellar crepitations nontender over joint line is negative valgus and varus stress negative Lachman's and McMurray's  Right knee Normal-appearing mild effusion range of motion from 0-120 1+ patellar crepitations nontender over joint line is negative valgus and varus stress negative Lachman's and McMurray's   Knee injection. Consent obtained and timeout performed. Patient seated in a comfortable position with legs hanging off the table.  The laterak Peri-patellar tendon space was palpated and marked. The skin was then cleaned with alcohol. Cold spray applied. A 25-gauge inch and a half  needle was used to inject 40 mg of Depo-Medrol and 4 mL of 0.5% Marcaine. Patient tolerated procedure well no bleeding. Pain improved following injection   Dg Knee Complete 4 Views Left  05/16/2012  *RADIOLOGY REPORT*  Clinical Data: Chronic knee pain, no acute injury  LEFT KNEE - COMPLETE 4+ VIEW  Comparison: None.  Findings: There is primarily unicompartmental degenerative joint disease involving the medial compartment where there is loss of joint space, sclerosis, and spurring.  No fracture is seen.  No effusion is noted.  The patella appears to be normally positioned.  IMPRESSION: Primarily unicompartmental degenerative joint disease involving the medial compartment.   Original Report Authenticated By: Juline Patch, M.D.    Dg Knee Complete 4 Views Right  05/16/2012  *RADIOLOGY REPORT*  Clinical Data: Pain  RIGHT KNEE - COMPLETE 4+ VIEW  Comparison: June 15, 2011  Findings: Standing frontal, standing frontal with a 20 degrees flexion, standing lateral, and sunrise patellar images were obtained. There is no fracture, dislocation, or effusion.  There is a bipartite patella, stable.  There is mild narrowing medially.  No erosive change or intra-articular calcifications.  IMPRESSION: Mild narrowing medially consistent with a degree of osteoarthritis. Bipartite patella.  No erosive change.  No fracture or effusion.   Original Report Authenticated By: Arvin Collard. WOODRUFF III, M.D.

## 2012-06-12 NOTE — Assessment & Plan Note (Signed)
Significant DJD seen on x-ray.  Left worse than right.  Plan: Continue weight loss and exercise corticosteroid injection to left knee followup as needed Discussed warning signs or symptoms. Please see discharge instructions. Patient expresses understanding.

## 2012-06-25 ENCOUNTER — Ambulatory Visit: Payer: Medicare Other | Admitting: Family Medicine

## 2012-07-02 ENCOUNTER — Ambulatory Visit (INDEPENDENT_AMBULATORY_CARE_PROVIDER_SITE_OTHER): Payer: Medicare Other | Admitting: Family Medicine

## 2012-07-02 ENCOUNTER — Encounter: Payer: Self-pay | Admitting: Family Medicine

## 2012-07-02 VITALS — BP 125/81 | HR 93 | Ht 64.0 in | Wt 248.0 lb

## 2012-07-02 DIAGNOSIS — M25569 Pain in unspecified knee: Secondary | ICD-10-CM

## 2012-07-02 DIAGNOSIS — M25561 Pain in right knee: Secondary | ICD-10-CM

## 2012-07-02 NOTE — Assessment & Plan Note (Signed)
Knee DJD causing pain recalcitrant to corticosteroid injection.  Plan for trial of visco supplementation. Patient will return next week for Supartz injection series of 3.  Additionally we'll consider weight loss modalities with her primary care provider at the family practice Center. Patient expresses understanding and agreement

## 2012-07-02 NOTE — Patient Instructions (Addendum)
Thank you for coming in today. I am sorry the shot did not work long.  We will try the supartz gel shots.  Come back next week for a series of weekly shots x 3 weeks.  This is pretty hit or miss too.  If they do not work we are likely looking at a joint replacement.  Weight loss is key.  Consider the nutritionist and the health coach at the family practice center.  Water aerobics are good.   Come back next week for your first injection.

## 2012-07-02 NOTE — Progress Notes (Signed)
Kendra [name] is a 46 y.o. female who presents to [facility] today for followup left knee pain. Was last seen October 16 for left knee pain secondary to confirmed moderate to severe DJD on x-ray.  She had a trial of a repeat corticosteroid injection.  The pain relief lasted only 2 days. She notes onset of continued knee pain worse with activity better with rest. Additionally associated with stiffness after periods of rest. She denies any significant radiating pain weakness or numbness. Additionally she denies any fevers or chills.   She has not had a trial of viscous supplementation yet.  She is interested in this approach.   She is thinking about working with a dietitian and health couch at the family practice Center for weight loss.    PMH reviewed. Obesity History  Substance Use Topics  . Smoking status: Current Every Day Smoker    Types: Cigarettes  . Smokeless tobacco: Never Used     Comment: cut back to 6-7 cigs per day from 1ppd  . Alcohol Use: 0.0 oz/week     Comment: 2 -40oz daily   ROS as above otherwise neg   Exam:  BP 125/81  Pulse 93  Ht 5\' 4"  (1.626 m)  Wt 248 lb (112.492 kg)  BMI 42.57 kg/m2 Gen: Well NAD

## 2012-07-09 ENCOUNTER — Ambulatory Visit (INDEPENDENT_AMBULATORY_CARE_PROVIDER_SITE_OTHER): Payer: Medicare Other | Admitting: Family Medicine

## 2012-07-09 DIAGNOSIS — M1710 Unilateral primary osteoarthritis, unspecified knee: Secondary | ICD-10-CM

## 2012-07-09 NOTE — Patient Instructions (Addendum)
Thank you for coming in today. Come back next week for your second injection.  Call or go to the ER if you develop a large red swollen joint with extreme pain or oozing puss.

## 2012-07-09 NOTE — Assessment & Plan Note (Signed)
Supartz #1 of 3 into the left knee today Return in one week for Supartz number 2 of 3

## 2012-07-09 NOTE — Progress Notes (Signed)
Kendra [name] is a 46 y.o. female who presents to [facility] today for Chronic knee pain secondary to knee DJD.  Plan today is to start supartz series as previously arranged.  No fevers chills erythema over the knee feels well otherwise.   PMH reviewed.  History  Substance Use Topics  . Smoking status: Current Every Day Smoker    Types: Cigarettes  . Smokeless tobacco: Never Used     Comment: cut back to 6-7 cigs per day from 1ppd  . Alcohol Use: 0.0 oz/week     Comment: 2 -40oz daily   ROS as above otherwise neg  Left Knee injection. Consent obtained and timeout performed. Patient seated in a comfortable position with legs hanging off the table.  The medial Peri-patellar tendon space was palpated and marked. The skin was then cleaned with Betadine. Cold spray applied. A 25-gauge inch and a half needle was used to inject 5 mL of 1% lidocaine without epinephrine.   A minute was elapsed to allow for full anesthesia.  Then a 21-gauge needle was introduced and Supartz was injected into the knee. Patient tolerated procedure well no bleeding.

## 2012-07-16 ENCOUNTER — Ambulatory Visit (INDEPENDENT_AMBULATORY_CARE_PROVIDER_SITE_OTHER): Payer: Medicare Other | Admitting: Family Medicine

## 2012-07-16 VITALS — BP 128/88 | Ht 64.0 in | Wt 246.0 lb

## 2012-07-16 DIAGNOSIS — M1710 Unilateral primary osteoarthritis, unspecified knee: Secondary | ICD-10-CM

## 2012-07-16 NOTE — Progress Notes (Signed)
Ms. Nesheim presents for her second in a series of 3 Supartz injection to her left knee.   She notes improvement in pain.  She denies any erythema fevers or chills.   Plan: F/u 1 week for repeat injection.  Left Knee injection.  Consent obtained and timeout performed.  Patient seated in a comfortable position with legs hanging off the table. The medial Peri-patellar tendon space was palpated and marked.  The skin was then cleaned with Betadine.  Cold spray applied.  A 25-gauge inch and a half needle was used to inject 5 mL of 1% lidocaine without epinephrine.  A minute was elapsed to allow for full anesthesia.  Then a 21-gauge needle was introduced and Supartz was injected into the knee.  Patient tolerated procedure well no bleeding.

## 2012-07-23 ENCOUNTER — Ambulatory Visit: Payer: Medicare Other | Admitting: Family Medicine

## 2012-07-23 DIAGNOSIS — M1710 Unilateral primary osteoarthritis, unspecified knee: Secondary | ICD-10-CM

## 2012-07-23 NOTE — Progress Notes (Signed)
Kendra [name] is a 46 y.o. female who presents to [facility] today for 3 in a series of 3 supartz injection to her left knee.  Plan today is to start supartz series as previously arranged. No fevers chills erythema over the knee feels well otherwise.    PMH reviewed. OA knee History  Substance Use Topics  . Smoking status: Current Every Day Smoker    Types: Cigarettes  . Smokeless tobacco: Never Used     Comment: cut back to 6-7 cigs per day from 1ppd  . Alcohol Use: 0.0 oz/week     Comment: 2 -40oz daily   ROS as above otherwise neg  Procedure: Injection left knee: Knee injection. Consent obtained and timeout performed. Patient seated in a comfortable position with legs hanging off the table.  The medial Peri-patellar tendon space was palpated and marked. The skin was then cleaned with Betadine. Cold spray applied. A 25-gauge inch and a half needle was used to inject 5 mL of 1% lidocaine without epinephrine.   A minute was elapsed to allow for full anesthesia.  Then a 21-gauge needle was introduced and Supartz was injected into the knee. Patient tolerated procedure well no bleeding. Pain improved following injection

## 2012-07-23 NOTE — Assessment & Plan Note (Signed)
Bilateral anterior knee pain secondary to DJD confirmed on x-ray. Left worse than right.  Failed corticosteroid trial Con Memos currently 3/3

## 2012-10-30 ENCOUNTER — Ambulatory Visit: Payer: Medicare Other | Admitting: Family Medicine

## 2012-12-24 ENCOUNTER — Ambulatory Visit: Payer: Medicare Other | Admitting: Family Medicine

## 2013-01-07 ENCOUNTER — Encounter: Payer: Self-pay | Admitting: Family Medicine

## 2013-01-07 ENCOUNTER — Ambulatory Visit (INDEPENDENT_AMBULATORY_CARE_PROVIDER_SITE_OTHER): Payer: Medicare Other | Admitting: Family Medicine

## 2013-01-07 VITALS — BP 107/73 | HR 92 | Ht 64.0 in | Wt 246.0 lb

## 2013-01-07 DIAGNOSIS — IMO0002 Reserved for concepts with insufficient information to code with codable children: Secondary | ICD-10-CM

## 2013-01-07 DIAGNOSIS — M1710 Unilateral primary osteoarthritis, unspecified knee: Secondary | ICD-10-CM

## 2013-01-07 MED ORDER — METHYLPREDNISOLONE ACETATE 40 MG/ML IJ SUSP
40.0000 mg | Freq: Once | INTRAMUSCULAR | Status: AC
  Administered 2013-01-07: 40 mg via INTRA_ARTICULAR

## 2013-01-07 NOTE — Progress Notes (Signed)
Kendra [name] is a 47 y.o. female who presents to [facility] today for followup left knee pain. Patient has chronic left knee pain due to moderate to severe DJD. She's had a trial of corticosteroid injections which were somewhat helpful. Additionally she had a Supartz viscous supplementation trial in November 2013. She notes that this was not helpful at all. She continues to experience moderate knee pain worse with activity better with rest. She is considering knee replacement. She would like to try on file corticosteroid injection before referral to orthopedics. She denies any radiating pain weakness numbness fevers or chills.   PMH reviewed. Obesity History  Substance Use Topics  . Smoking status: Current Every Day Smoker    Types: Cigarettes  . Smokeless tobacco: Never Used     Comment: cut back to 6-7 cigs per day from 1ppd  . Alcohol Use: 0.0 oz/week     Comment: 2 -40oz daily   ROS as above otherwise neg   Exam:  BP 107/73  Pulse 92  Ht 5\' 4"  (1.626 m)  Wt 246 lb (111.585 kg)  BMI 42.21 kg/m2 Gen: Well NAD MSK: : Left knee: Normal-appearing mild effusion range of motion from 0-120 1+ patellar crepitations nontender over joint line is negative valgus and varus stress negative Lachman's and McMurray's  Right knee Normal-appearing mild effusion range of motion from 0-120 1+ patellar crepitations nontender over joint line is negative valgus and varus stress negative Lachman's and McMurray's   Knee injection.  Consent obtained and timeout performed.  Patient seated in a comfortable position with legs hanging off the table. The lateral Peri-patellar tendon space was palpated and marked.  The skin was then cleaned with alcohol.  Cold spray applied.  A 25-gauge inch and a half needle was used to inject 40 mg of Depo-Medrol and 4 mL of 0.5% Marcaine.  Patient tolerated procedure well no bleeding.  Pain improved following injection

## 2013-01-07 NOTE — Assessment & Plan Note (Signed)
Bilateral anterior knee pain secondary to DJD confirmed on x-ray. Left worse than right.  Failed corticosteroid trial Supartz currently 3/3 Retrying  steroid injectionprior to referral to orthopedics

## 2013-01-07 NOTE — Patient Instructions (Addendum)
Thank you for coming in today. We will do another cortisone shot.  If this does not work we will get you to orthopedics. Call or go to the ER if you develop a large red swollen joint with extreme pain or oozing puss.

## 2013-03-16 ENCOUNTER — Encounter: Payer: Self-pay | Admitting: Home Health Services

## 2013-05-05 ENCOUNTER — Encounter (HOSPITAL_COMMUNITY): Payer: Self-pay | Admitting: *Deleted

## 2013-05-05 ENCOUNTER — Emergency Department (HOSPITAL_COMMUNITY)
Admission: EM | Admit: 2013-05-05 | Discharge: 2013-05-05 | Disposition: A | Discharge status: Home / Self Care | Payer: Medicare Other | Attending: Emergency Medicine | Admitting: Emergency Medicine

## 2013-05-05 ENCOUNTER — Emergency Department (HOSPITAL_COMMUNITY): Payer: Medicare Other

## 2013-05-05 DIAGNOSIS — IMO0002 Reserved for concepts with insufficient information to code with codable children: Secondary | ICD-10-CM | POA: Insufficient documentation

## 2013-05-05 DIAGNOSIS — R059 Cough, unspecified: Secondary | ICD-10-CM | POA: Insufficient documentation

## 2013-05-05 DIAGNOSIS — Z862 Personal history of diseases of the blood and blood-forming organs and certain disorders involving the immune mechanism: Secondary | ICD-10-CM | POA: Insufficient documentation

## 2013-05-05 DIAGNOSIS — R0602 Shortness of breath: Secondary | ICD-10-CM

## 2013-05-05 DIAGNOSIS — R05 Cough: Secondary | ICD-10-CM | POA: Insufficient documentation

## 2013-05-05 DIAGNOSIS — F17200 Nicotine dependence, unspecified, uncomplicated: Secondary | ICD-10-CM | POA: Insufficient documentation

## 2013-05-05 DIAGNOSIS — Z720 Tobacco use: Secondary | ICD-10-CM

## 2013-05-05 DIAGNOSIS — R079 Chest pain, unspecified: Secondary | ICD-10-CM | POA: Insufficient documentation

## 2013-05-05 LAB — CBC
MCH: 21.8 pg — ABNORMAL LOW (ref 26.0–34.0)
MCV: 73.7 fL — ABNORMAL LOW (ref 78.0–100.0)
Platelets: 394 10*3/uL (ref 150–400)
RBC: 3.76 MIL/uL — ABNORMAL LOW (ref 3.87–5.11)
RDW: 17.2 % — ABNORMAL HIGH (ref 11.5–15.5)
WBC: 6 10*3/uL (ref 4.0–10.5)

## 2013-05-05 LAB — PRO B NATRIURETIC PEPTIDE: Pro B Natriuretic peptide (BNP): 38.7 pg/mL (ref 0–125)

## 2013-05-05 LAB — BASIC METABOLIC PANEL
Calcium: 9.5 mg/dL (ref 8.4–10.5)
Creatinine, Ser: 0.85 mg/dL (ref 0.50–1.10)
GFR calc Af Amer: >90 mL/min (ref >90)
Sodium: 136 mEq/L (ref 135–145)

## 2013-05-05 LAB — POCT I-STAT TROPONIN I: Troponin i, poc: 0 ng/mL (ref 0.00–0.08)

## 2013-05-05 MED ORDER — PREDNISONE 20 MG PO TABS
60.0000 mg | ORAL_TABLET | Freq: Once | ORAL | Status: AC
  Administered 2013-05-05: 60 mg via ORAL
  Filled 2013-05-05: qty 3

## 2013-05-05 MED ORDER — PREDNISONE 20 MG PO TABS: 40.0000 mg | ORAL_TABLET | Freq: Every day | ORAL | Status: DC

## 2013-05-05 MED ORDER — HYDROCODONE-HOMATROPINE 5-1.5 MG/5ML PO SYRP: 5.0000 mL | ORAL_SOLUTION | Freq: Four times a day (QID) | ORAL | Status: DC | PRN

## 2013-05-05 MED ORDER — AEROCHAMBER PLUS FLO-VU LARGE MISC
1.0000 | Freq: Once | Status: AC
  Administered 2013-05-05: 1
  Filled 2013-05-05: qty 1

## 2013-05-05 MED ORDER — IPRATROPIUM BROMIDE 0.02 % IN SOLN
0.5000 mg | Freq: Once | RESPIRATORY_TRACT | Status: AC
  Administered 2013-05-05: 0.5 mg via RESPIRATORY_TRACT
  Filled 2013-05-05: qty 2.5

## 2013-05-05 MED ORDER — ALBUTEROL SULFATE (5 MG/ML) 0.5% IN NEBU
2.5000 mg | INHALATION_SOLUTION | Freq: Once | RESPIRATORY_TRACT | Status: AC
  Administered 2013-05-05: 2.5 mg via RESPIRATORY_TRACT
  Filled 2013-05-05: qty 0.5

## 2013-05-05 MED ORDER — ALBUTEROL SULFATE HFA 108 (90 BASE) MCG/ACT IN AERS
2.0000 | INHALATION_SPRAY | Freq: Once | RESPIRATORY_TRACT | Status: AC
  Administered 2013-05-05: 2 via RESPIRATORY_TRACT
  Filled 2013-05-05: qty 6.7

## 2013-05-05 NOTE — ED Notes (Signed)
Pt is here with chest pain, sob, cough.  Pt is tachypneic at triage and appears to be hyperventilating.  Pt states mid chest pain that has been going on for a while.  Pt  sats 94% RA and lungs sound clear

## 2013-05-05 NOTE — ED Provider Notes (Signed)
CSN: [id number]     Arrival date & time 05/05/13  1445 History   None    Chief Complaint  Patient presents with  . Chest Pain  . Shortness of Breath   (Consider location/radiation/quality/duration/timing/severity/associated sxs/prior Treatment) HPI Comments: Patient is a 47 year old female with a history of tobacco use who presents for shortness of breath x3 weeks, worsening x3 days. Patient states that shortness of breath has been constant since onset without any aggravating or alleviating factors. Patient states that pain is associated with an aching, central, nonradiating chest pain that is intermittent. She also admits to a dry nonproductive cough. Symptoms are without modifying factors; she has tried Tylenol for symptoms without relief. Patient denies a history of asthma, recent hospitalizations or surgeries, hormone replacement therapy, and history of DVT or PE. Patient further denies history of ACS, hypertension, and coronary artery disease. She is a 1ppd smoker. Patient denies associated fever, lightheadedness, dizziness, N/V, abdominal pain, urinary symptoms, numbness/tingling, extremity weakness, and syncope.  The history is provided by the patient. No language interpreter was used.    Past Medical History  Diagnosis Date  . Anemia    Past Surgical History  Procedure Laterality Date  . Cesarean section    . Tubal ligation    . Tibia fracture surgery     Family History  Problem Relation Age of Onset  . Diabetes Mother   . Heart disease Mother   . Cancer Father 72    lung   History  Substance Use Topics  . Smoking status: Current Every Day Smoker    Types: Cigarettes  . Smokeless tobacco: Never Used     Comment: cut back to 6-7 cigs per day from 1ppd  . Alcohol Use: 0.0 oz/week     Comment: 2 -40oz daily   OB History   Grav Para Term Preterm Abortions TAB SAB Ect Mult Living                 Review of Systems  Constitutional: Negative for fever.  Eyes: Negative  for visual disturbance.  Respiratory: Positive for cough and shortness of breath.   Cardiovascular: Positive for chest pain.  Gastrointestinal: Negative for nausea and vomiting.  Neurological: Negative for weakness and numbness.  All other systems reviewed and are negative.   Allergies  Review of patient's allergies indicates no known allergies.  Home Medications   Current Outpatient Rx  Name  Route  Sig  Dispense  Refill  . HYDROcodone-homatropine (HYCODAN) 5-1.5 MG/5ML syrup   Oral   Take 5 mLs by mouth every 6 (six) hours as needed for cough.   30 mL   1   . predniSONE (DELTASONE) 20 MG tablet   Oral   Take 2 tablets (40 mg total) by mouth daily.   10 tablet   0   . traMADol (ULTRAM) 50 MG tablet   Oral   Take 1 tablet (50 mg total) by mouth at bedtime as needed for pain.   60 tablet   0    BP 145/81  Pulse 115  Temp(Src) 97.9 F (36.6 C) (Oral)  Resp 28  SpO2 100%  Physical Exam  Nursing note and vitals reviewed. Constitutional: She is oriented to person, place, and time. She appears well-developed and well-nourished. No distress.  HENT:  Head: Normocephalic and atraumatic.  Mouth/Throat: Oropharynx is clear and moist. No oropharyngeal exudate.  Eyes: Conjunctivae and EOM are normal. Pupils are equal, round, and reactive to light. No scleral icterus.  Neck: Normal range of motion.  Cardiovascular: Normal rate, regular rhythm and normal heart sounds.   Pulmonary/Chest: Breath sounds normal. No respiratory distress. She has no wheezes. She has no rales. She exhibits tenderness.    Patient mildly tachypneic; No accessory muscle use or retractions. No acute respiratory distress. Poor air movement with prolonged exhalation. No wheezes, rales, or rhonchi.  Abdominal: Soft. She exhibits no distension. There is no tenderness.  Musculoskeletal: Normal range of motion.  Neurological: She is alert and oriented to person, place, and time.  Skin: Skin is warm and dry.  No rash noted. She is not diaphoretic. No erythema. No pallor.  Psychiatric: She has a normal mood and affect. Her behavior is normal.    ED Course  Procedures (including critical care time) Labs Review Labs Reviewed  CBC - Abnormal; Notable for the following:    RBC 3.76 (*)    Hemoglobin 8.2 (*)    HCT 27.7 (*)    MCV 73.7 (*)    MCH 21.8 (*)    MCHC 29.6 (*)    RDW 17.2 (*)    All other components within normal limits  BASIC METABOLIC PANEL - Abnormal; Notable for the following:    Glucose, Bld 115 (*)    GFR calc non Af Amer 81 (*)    All other components within normal limits  PRO B NATRIURETIC PEPTIDE  D-DIMER, QUANTITATIVE  POCT I-STAT TROPONIN I   Imaging Review Dg Chest 2 View  05/05/2013   *RADIOLOGY REPORT*  Clinical Data: Chest pain and shortness of breath  CHEST - 2 VIEW  Comparison:  November 30, 2011  Findings: Lungs clear.  The heart size and pulmonary vascularity are normal.  No pneumothorax.  No adenopathy.  No bone lesions.  IMPRESSION: No abnormality noted.   Original Report Authenticated By: Bretta Bang, M.D.    Date: 05/05/2013  Rate: 112  Rhythm: sinus tachycardia  QRS Axis: normal  Intervals: normal  ST/T Wave abnormalities: normal  Conduction Disutrbances:none  Narrative Interpretation: Sinus tachycardia; no STEMI  Old EKG Reviewed: unchanged from 04/17/2012 I have personally reviewed and interpreted this EKG   MDM   1. Shortness of breath   2. Tobacco use    Patient is a 47 year old female with no significant past medical history presents for shortness of breath x3 weeks, worsening x3 days. Symptoms associated with a dry, nonproductive cough. On initial presentation patient as well and nontoxic appearing. She is hemodynamically stable. Patient appears mildly tachypneic, but is satting well with oxygen saturations of 100% on room air. Physical exam significant only for tenderness to palpation of the chest wall. Labs consistent with priors. BNP  within normal limits. EKG unchanged from prior and troponin 0.00. Doubt ACS given atypical nature of symptoms, physical exam, and reassuring workup. Patient also without history of ACS, CAD, HTN, HLD, or DM. D-dimer ordered in light of tachycardia and reproducible chest pain which was found to be WNL. Doubt pulmonary embolism given normal d-dimer and the fact that patient is PERC negative. CXR without pneumonia, PTX, pleural effusion, or pulmonary edema. Will order PO prednisone and DuoNeb for symptoms.  Patient endorses improvement in symptoms with DuoNeb. Lungs CTAB on reexamination and patient is without tachypnea, dyspnea, or hypoxia. Improved air movement. Suspect symptoms to be secondary to chronic tobacco use and, potential underlying viral process. Chest pain likely costochondral in nature secondary to coughing. Patient stable for d/c with PCP follow up. Will provided albuterol inhaler, prednisone burst, and Hycodan  for symptoms. Return precautions discussed and patient agreeable to plan with no unaddressed concerns.   Antony Madura, PA-C 05/05/13 1728

## 2013-05-05 NOTE — ED Notes (Signed)
Exitcare information printed off for pt. since first time user of inhaler.

## 2013-05-05 NOTE — ED Provider Notes (Signed)
Medical screening examination/treatment/procedure(s) were performed by non-physician practitioner and as supervising physician I was immediately available for consultation/collaboration.  Inaya Gillham N Kinza Gouveia, DO 05/05/13 2014 

## 2013-05-07 ENCOUNTER — Telehealth: Payer: Self-pay | Admitting: Family Medicine

## 2013-05-07 MED ORDER — ALBUTEROL SULFATE HFA 108 (90 BASE) MCG/ACT IN AERS: 2.0000 | INHALATION_SPRAY | RESPIRATORY_TRACT | Status: DC | PRN

## 2013-05-07 NOTE — Telephone Encounter (Signed)
Using albuterol TID. Told to use Q2-4hrs Pt just picked up prednisone last night. Instructed to continue prednisone and to use albuterol with spacer every 4 hours for the next 3 days. Pt to call or got to ED if respiratory status worsens. Pt to see me in October Albuterol refilled.   Shelly Flatten, MD Family Medicine PGY-3 05/07/2013, 3:53 PM

## 2013-05-07 NOTE — Telephone Encounter (Signed)
Pt called because she was seen in the ER and was given a inhaler. She has been using it and will not have enough to last her until her appointment in October. She would like refills called in to last until her appointment. JW

## 2013-06-01 ENCOUNTER — Encounter: Payer: Self-pay | Admitting: Family Medicine

## 2013-06-01 ENCOUNTER — Ambulatory Visit (INDEPENDENT_AMBULATORY_CARE_PROVIDER_SITE_OTHER): Payer: Medicare Other | Admitting: Family Medicine

## 2013-06-01 VITALS — BP 130/84 | HR 80 | Temp 97.9°F | Resp 26 | Ht 64.0 in | Wt 275.0 lb

## 2013-06-01 DIAGNOSIS — R0602 Shortness of breath: Secondary | ICD-10-CM | POA: Insufficient documentation

## 2013-06-01 DIAGNOSIS — R739 Hyperglycemia, unspecified: Secondary | ICD-10-CM | POA: Insufficient documentation

## 2013-06-01 DIAGNOSIS — M545 Low back pain: Secondary | ICD-10-CM

## 2013-06-01 DIAGNOSIS — G8929 Other chronic pain: Secondary | ICD-10-CM

## 2013-06-01 DIAGNOSIS — F17200 Nicotine dependence, unspecified, uncomplicated: Secondary | ICD-10-CM

## 2013-06-01 DIAGNOSIS — R635 Abnormal weight gain: Secondary | ICD-10-CM | POA: Insufficient documentation

## 2013-06-01 DIAGNOSIS — R7309 Other abnormal glucose: Secondary | ICD-10-CM

## 2013-06-01 DIAGNOSIS — R0609 Other forms of dyspnea: Secondary | ICD-10-CM | POA: Insufficient documentation

## 2013-06-01 LAB — POCT GLYCOSYLATED HEMOGLOBIN (HGB A1C): Hemoglobin A1C: 5.3

## 2013-06-01 LAB — COMPREHENSIVE METABOLIC PANEL
ALT: 21 U/L (ref 0–35)
AST: 20 U/L (ref 0–37)
Alkaline Phosphatase: 58 U/L (ref 39–117)
Potassium: 4.7 mEq/L (ref 3.5–5.3)
Sodium: 137 mEq/L (ref 135–145)
Total Bilirubin: 0.6 mg/dL (ref 0.3–1.2)
Total Protein: 6.9 g/dL (ref 6.0–8.3)

## 2013-06-01 LAB — LIPID PANEL
LDL Cholesterol: 88 mg/dL (ref 0–99)
Total CHOL/HDL Ratio: 3.8 Ratio
VLDL: 27 mg/dL (ref 0–40)

## 2013-06-01 MED ORDER — IBUPROFEN 600 MG PO TABS: 600.0000 mg | ORAL_TABLET | Freq: Three times a day (TID) | ORAL | Status: DC | PRN

## 2013-06-01 NOTE — Assessment & Plan Note (Signed)
Obesity hypoventilation syndrome (50lb wt gain in 12 mo and BMI > 47) vs COPD. No formal PFTs in past, Will send to Dr Raymondo Band for this Continue w/ Albuterol until PFTs performed.

## 2013-06-01 NOTE — Assessment & Plan Note (Signed)
50lb wt gain in past year. Pt did not meet w/ Dr. Gerilyn Pilgrim. Pt to contact TSH, Lipid, A1c, Vit D

## 2013-06-01 NOTE — Assessment & Plan Note (Signed)
50lb wt gain in past year.  Significant concernf ro pts future health Pt states that MSK pain limits her mobility, but also endorses poor diet Again referred to Dr. Gerilyn Pilgrim for counseling.

## 2013-06-01 NOTE — Assessment & Plan Note (Signed)
Continues to push back quit date but has cut down Counsesled to quit again

## 2013-06-01 NOTE — Assessment & Plan Note (Signed)
50lb wt gain in past year BMI >47 Will try Motrin 600mg   Pt to contact Dr. Gerilyn Pilgrim

## 2013-06-01 NOTE — Patient Instructions (Addendum)
Your breathing struggles are likely from your weight gain and from lung distruction from smoking Please schedule a time to meet with Dr. Raymondo Band for pulmonary function tests and a time to meet with Dr. Gerilyn Pilgrim for nutrition counseling Come back to see me in 2 weeks Try the Motrin 600 for the back pain

## 2013-06-01 NOTE — Progress Notes (Signed)
Kendra [name] is a 47 y.o. female who presents to [facility] today for ED f/u for SOB  SOB: every day. Finished prednisone several weeks ago. Still using albuterol Q4 hours. Better w/ inhaler, worse w/ ambulation. Denies chest pain and palpitations. Cold and runny nose of late. Improved w/ steroids.   Tobacco: 4 cig per day.   Back pain: tylenol 2-3 prn w/o relief. Ibuprofen 400mg  w/o relief. Wt gain noted. (50lb in past year). Did not meet w/ Dr. Gerilyn Pilgrim.   The following portions of the patient's history were reviewed and updated as appropriate: allergies, current medications, past medical history, family and social history, and problem list.  Patient is a smoker.   Past Medical History  Diagnosis Date  . Anemia     ROS as above otherwise neg.    Medications reviewed. Current Outpatient Prescriptions  Medication Sig Dispense Refill  . albuterol (PROVENTIL HFA;VENTOLIN HFA) 108 (90 BASE) MCG/ACT inhaler Inhale 2 puffs into the lungs every 4 (four) hours as needed for wheezing. Use with spacer with every puff  2 Inhaler  3  . HYDROcodone-homatropine (HYCODAN) 5-1.5 MG/5ML syrup Take 5 mLs by mouth every 6 (six) hours as needed for cough.  30 mL  1  . predniSONE (DELTASONE) 20 MG tablet Take 2 tablets (40 mg total) by mouth daily.  10 tablet  0   No current facility-administered medications for this visit.    Exam:  BP 130/84  Pulse 80  Temp(Src) 97.9 F (36.6 C) (Oral)  Resp 26  Ht 5\' 4"  (1.626 m)  Wt 275 lb (124.739 kg)  BMI 47.18 kg/m2  SpO2 99%  LMP 05/25/2013 Gen: Well NAD HEENT: EOMI,  MMM Lungs: CTABL Nl WOB Heart: RRR no MRG Abd: NABS, NT, ND Exts: Non edematous BL  LE, warm and well perfused.   No results found for this or any previous visit (from the past 72 hour(s)).

## 2013-06-09 ENCOUNTER — Ambulatory Visit: Payer: Medicare Other | Admitting: Pharmacist

## 2013-06-15 ENCOUNTER — Encounter: Payer: Self-pay | Admitting: Pharmacist

## 2013-06-15 ENCOUNTER — Ambulatory Visit (INDEPENDENT_AMBULATORY_CARE_PROVIDER_SITE_OTHER): Payer: Medicare Other | Admitting: Pharmacist

## 2013-06-15 VITALS — Ht 65.0 in | Wt 280.9 lb

## 2013-06-15 DIAGNOSIS — F17200 Nicotine dependence, unspecified, uncomplicated: Secondary | ICD-10-CM

## 2013-06-15 DIAGNOSIS — R0602 Shortness of breath: Secondary | ICD-10-CM

## 2013-06-15 NOTE — Progress Notes (Signed)
S:    Patient arrives in good spirit.  Presents for lung function evaluation stating she has had trouble breathing since January 2014.  She denies any past PFT evaluation.  Patient reports breathing has been difficult with walking AND walking stairs.  She is currently unable to climb one flight of stairs. Patient used Albuterol MDI at 8:00 AM today (< 2 hours prior to evaluation).   She started smoking in 2004 and has smoked 1ppd until recently trying to cut back on cigarettes.  She admits to smoking 4-5 cigarettes per day.     O: See "scanned report" or Documentation Flowsheet (discrete results - PFTs) for  Spirometry results. Patient provided good effort while attempting spirometry.   Lung Age = 82 years   A/P:  Spirometry evaluation with Pre and Post Bronchodilator reveals "near normal" lung function.  However, lung age was calculated as 82 years with a 24% chance of developing COPD if she continued to smoke.  Patient has been experiencing shortness of breath with exertion for 10 monhts and taking albuterol MDI with moderate relief. NO medication change at this time.  Educated patient on purpose, proper use of MDI - following reeducation technique was acceptable. Reviewed results of pulmonary function tests.  Encouraged Weight loss and Tobacco Cessation.  Offered support as needed and requested.  Pt verbalized understanding of results and education.  Written pt instructions provided.  F/U Clinic visit with Dr. Gerilyn Pilgrim and Dr. Konrad Dolores.   Total time in face to face counseling 30 minutes.

## 2013-06-15 NOTE — Patient Instructions (Signed)
Lung function test was near normal BUT it was reduced from normal.   Please work hard on weight loss, and consider quitting smoking in the near future.   Follow up with Dr. Gerilyn Pilgrim, AND Dr. Konrad Dolores.   It was nice to meet you.

## 2013-06-15 NOTE — Progress Notes (Signed)
Patient ID: Kendra [name], female   DOB: 21-Sep-1965, 47 y.o.   MRN: [id number] Reviewed: Agree with Dr. Macky Lower documentation and management.

## 2013-06-15 NOTE — Assessment & Plan Note (Signed)
Spirometry evaluation with Pre and Post Bronchodilator reveals "near normal" lung function.  However, lung age was calculated as 82 years with a 24% chance of developing COPD if she continued to smoke.  Patient has been experiencing shortness of breath with exertion for 10 monhts and taking albuterol MDI with moderate relief. NO medication change at this time.  Educated patient on purpose, proper use of MDI - following reeducation technique was acceptable. Reviewed results of pulmonary function tests.  Encouraged Weight loss and Tobacco Cessation.  Offered support as needed and requested.  Pt verbalized understanding of results and education.  Written pt instructions provided.  F/U Clinic visit with Dr. Gerilyn Pilgrim and Dr. Konrad Dolores.   Total time in face to face counseling 30 minutes.

## 2013-06-15 NOTE — Assessment & Plan Note (Signed)
Spirometry evaluation with Pre and Post Bronchodilator reveals "near normal" lung function.  However, lung age was calculated as 82 years with a 24% chance of developing COPD if she continued to smoke.  Patient has been experiencing shortness of breath with exertion for 10 monhts and taking albuterol MDI with moderate relief. NO medication change at this time.  Educated patient on purpose, proper use of MDI - following reeducation technique was acceptable. Reviewed results of pulmonary function tests.  Encouraged Weight loss and Tobacco Cessation.  Offered support as needed and requested.  Pt verbalized understanding of results and education.  Written pt instructions provided.  F/U Clinic visit with Dr. Sykes and Dr. Merrell.   Total time in face to face counseling 30 minutes.   

## 2013-07-07 ENCOUNTER — Encounter: Payer: Self-pay | Admitting: Family Medicine

## 2013-07-21 ENCOUNTER — Ambulatory Visit: Payer: Medicare Other | Admitting: Family Medicine

## 2013-09-03 ENCOUNTER — Ambulatory Visit: Payer: Medicare Other | Admitting: Family Medicine

## 2013-09-10 ENCOUNTER — Ambulatory Visit: Payer: Medicare Other | Admitting: Family Medicine

## 2013-10-05 ENCOUNTER — Ambulatory Visit: Payer: Medicare Other | Admitting: Sports Medicine

## 2013-10-15 ENCOUNTER — Other Ambulatory Visit: Payer: Self-pay

## 2013-10-15 ENCOUNTER — Telehealth: Payer: Self-pay | Admitting: *Deleted

## 2013-10-15 DIAGNOSIS — Z1231 Encounter for screening mammogram for malignant neoplasm of breast: Secondary | ICD-10-CM

## 2013-10-15 NOTE — Telephone Encounter (Signed)
Pt called to request phone to [place].  Phone number given, pt stated she received an automatic call stating it is time for her mammogram.  Derl Barrow, RN

## 2013-10-23 ENCOUNTER — Ambulatory Visit: Payer: Medicare Other | Admitting: Sports Medicine

## 2013-10-29 ENCOUNTER — Ambulatory Visit (INDEPENDENT_AMBULATORY_CARE_PROVIDER_SITE_OTHER): Payer: PRIVATE HEALTH INSURANCE | Admitting: Sports Medicine

## 2013-10-29 ENCOUNTER — Encounter: Payer: Self-pay | Admitting: Sports Medicine

## 2013-10-29 VITALS — BP 131/78 | HR 109 | Ht 65.0 in | Wt 280.0 lb

## 2013-10-29 DIAGNOSIS — M25569 Pain in unspecified knee: Secondary | ICD-10-CM

## 2013-10-29 DIAGNOSIS — M25561 Pain in right knee: Secondary | ICD-10-CM

## 2013-10-29 DIAGNOSIS — M25562 Pain in left knee: Principal | ICD-10-CM

## 2013-10-29 DIAGNOSIS — M179 Osteoarthritis of knee, unspecified: Secondary | ICD-10-CM

## 2013-10-29 DIAGNOSIS — IMO0002 Reserved for concepts with insufficient information to code with codable children: Secondary | ICD-10-CM

## 2013-10-29 DIAGNOSIS — M1710 Unilateral primary osteoarthritis, unspecified knee: Secondary | ICD-10-CM

## 2013-10-29 MED ORDER — METHYLPREDNISOLONE ACETATE 40 MG/ML IJ SUSP
40.0000 mg | Freq: Once | INTRAMUSCULAR | Status: AC
  Administered 2013-10-29: 40 mg via INTRA_ARTICULAR

## 2013-10-29 NOTE — Progress Notes (Signed)
Subjective:    Patient ID: Kendra [name], female    DOB: [birth year], 48 y.o.   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 She has received multiple steroid injections in her knees and typically has symptom relief for 4-6 months. Her most recent injection was May 2014 and pain relief lasted until October 2014. She will eventually need a knee replacement, but is not interested in receiving one at this time. Pain is constant, but worse with walking and climbing stairs. She has pain at rest and pain will also wake her up at night.   Kendra [name] is also complaining of lower back pain which is in the middle of the back and along both sides. Pain is worse with walking and standing for a long time but is not relieved by any positions such as seated or lying down. She feels as though the pain is like a weakness and is constant. She takes tylenol and percocet as needed for pain relief. She has not had any imaging of her back. Pain does not radiate down the leg. No numbness or tingling.   She is also complaining of Right hip pain. Pain is worse with walking an standing and relieved with sitting and lying down. Pain is sharp in nature but does not continue once seated. She points to the lateral hip as the site of the pain.    Review of Systems Negative apart from HPI     Objective:   Physical Exam Obese, no acute distress  Knees: Inspection: no swelling or erythema. No bowing while standing. Palpation: difficult to palpate medial join space on the Left knee. Mild tenderness to palpation along anterior left knee. ROM: equal and normal bilaterally. Back: Inspection: symmetric without obvious abnormal curvature of the spine. Palpation: tenderness along the Lumbar vertebrae and Lumbar paraspinous muscles with radiating to the side. ROM: good flexion and extension. Some pain with flexion and  extension but pain is equal with both movements.   Right Hip: Inspection: symmetric bilaterally. Palpation: Tenderness to palpation on Right lateral hip  Left Knee 4 View X-Ray 04/2012: IMPRESSION: Primarily unicompartmental degenerative joint disease involving the medial compartment.  Right Knee 4 view x-ray 04/2012: IMPRESSION: Mild narrowing medially consistent with a degree of osteoarthritis. Bipartite patella. No erosive change. No fracture or effusion.     Assessment & Plan:   48 yo with history of osteoarthritis of the knees who presents with knee pain.   1. Osteoarthritis of the Knees: Pain has previously been relieved with steroid injections. Will likely eventually need a knee replacement.  --Left lateral joint space injection of 40 mg Methylprednisolone acetate --Right medial joint space injection of 40 mg Methylprednisolone acetate --Follow-up in 3 weeks --If pain relief does not last for a few months will need to repeat Knee X-rays and consider getting surgery sooner.  2. Low Back pain: --Lumbar Spine X-Rays --Follow-up in 3 weeks  3. Right Hip pain: likely bursitis or tendonitis. Not her primary concern today. Pt already taking tylenol and NSAIDs --Continue pain management of NSAIDs and Tylenol for other pain symptoms will work for hip pain.   Consent obtained and verified. Time-out conducted. Noted no overlying erythema, induration, or other signs of local infection. Skin prepped in a sterile fashion. Topical analgesic spray: Ethyl chloride. Joint: right knee Needle: 22g 1.5 inch Completed without difficulty. Meds: 1cc (40mg )  depomedrol, 3cc 1% xylocaine  Consent obtained and verified. Time-out conducted. Noted no overlying erythema, induration, or other signs of local infection. Skin prepped in a sterile fashion. Topical analgesic spray: Ethyl chloride. Joint: left knee Needle: 22g 1.5 inch Completed without difficulty. Meds: 1cc (40mg ) depomedrol, 3cc 1%  xylocaine  Advised to call if fevers/chills, erythema, induration, drainage, or persistent bleeding.   Advised to call if fevers/chills, erythema, induration, drainage, or persistent bleeding.   Seen with Jacqulyn Liner, MS4

## 2013-11-04 ENCOUNTER — Ambulatory Visit: Payer: Medicare Other

## 2013-11-05 ENCOUNTER — Ambulatory Visit
Admission: RE | Admit: 2013-11-05 | Discharge: 2013-11-05 | Disposition: A | Discharge status: Home / Self Care | Payer: Medicare Other | Source: Ambulatory Visit

## 2013-11-05 DIAGNOSIS — Z1231 Encounter for screening mammogram for malignant neoplasm of breast: Secondary | ICD-10-CM

## 2013-11-06 ENCOUNTER — Ambulatory Visit: Payer: Medicare Other | Admitting: Family Medicine

## 2013-11-10 ENCOUNTER — Ambulatory Visit
Admission: RE | Admit: 2013-11-10 | Discharge: 2013-11-10 | Disposition: A | Discharge status: Home / Self Care | Payer: Medicare Other | Source: Ambulatory Visit | Attending: Sports Medicine | Admitting: Sports Medicine

## 2013-11-10 DIAGNOSIS — M25561 Pain in right knee: Secondary | ICD-10-CM

## 2013-11-10 DIAGNOSIS — M25562 Pain in left knee: Principal | ICD-10-CM

## 2013-11-12 ENCOUNTER — Ambulatory Visit (INDEPENDENT_AMBULATORY_CARE_PROVIDER_SITE_OTHER): Payer: PRIVATE HEALTH INSURANCE | Admitting: Family Medicine

## 2013-11-12 DIAGNOSIS — F17200 Nicotine dependence, unspecified, uncomplicated: Secondary | ICD-10-CM

## 2013-11-12 DIAGNOSIS — R200 Anesthesia of skin: Secondary | ICD-10-CM

## 2013-11-12 DIAGNOSIS — IMO0002 Reserved for concepts with insufficient information to code with codable children: Secondary | ICD-10-CM

## 2013-11-12 DIAGNOSIS — M1710 Unilateral primary osteoarthritis, unspecified knee: Secondary | ICD-10-CM

## 2013-11-12 DIAGNOSIS — R209 Unspecified disturbances of skin sensation: Secondary | ICD-10-CM

## 2013-11-12 DIAGNOSIS — R202 Paresthesia of skin: Secondary | ICD-10-CM

## 2013-11-12 MED ORDER — TRAMADOL HCL 50 MG PO TABS: 50.0000 mg | ORAL_TABLET | Freq: Two times a day (BID) | ORAL | Status: DC | PRN

## 2013-11-12 MED ORDER — MELOXICAM 15 MG PO TABS: ORAL_TABLET | ORAL | Status: DC

## 2013-11-12 NOTE — Progress Notes (Signed)
Kendra [name] is a 48 y.o. female who presents to [facility] today for pain  Bilat knee pain: OA bilat. Seeing sm. Pain nearly resolved after bilat knee injections. Pt to see ortho next week for surgery consult  Low back paindna hip pain. Tylenol and NSAIDs work well for this pain.   Asthma: using albuterol typically just during exercise. Using 3-4x wkly. Still smoking 1/2ppd.   Obesity: eating more and decreased exercise due to knee pain.   Denies CP, SOB, palpitations.   The following portions of the patient's history were reviewed and updated as appropriate: allergies, current medications, past medical history, family and social history, and problem list.  Patient is a smoker.   Past Medical History  Diagnosis Date  . Anemia     ROS as above otherwise neg.    Medications reviewed. Current Outpatient Prescriptions  Medication Sig Dispense Refill  . albuterol (PROVENTIL HFA;VENTOLIN HFA) 108 (90 BASE) MCG/ACT inhaler Inhale 2 puffs into the lungs every 4 (four) hours as needed for wheezing. Use with spacer with every puff  2 Inhaler  3  . ibuprofen (ADVIL,MOTRIN) 600 MG tablet Take 1 tablet (600 mg total) by mouth every 8 (eight) hours as needed for pain.  30 tablet  0   No current facility-administered medications for this visit.    Exam:  There were no vitals taken for this visit. Gen: Well NAD HEENT: EOMI,  MMM MSK: bilat crepitus in knees. No effusion, FROM, ttp along medial lines  No results found for this or any previous visit (from the past 67 hour(s)).  A/P (as seen in Problem list)  Morbid obesity Wt gain in part due to decreased mobility from knees Knee replacement pending final ortho eval  TOBACCO ABUSE Counseled to quit.  Pt moving towards COPD quickly  Osteoarthrosis, unspecified whether generalized or localized, lower leg Severe oa bilat knees Recent steroid injections w/ benefit Start meloxicam and tramadol PRN when pain returns F/u w/ ortho next week  for surgery consult

## 2013-11-12 NOTE — Assessment & Plan Note (Signed)
Wt gain in part due to decreased mobility from knees Knee replacement pending final ortho eval

## 2013-11-12 NOTE — Assessment & Plan Note (Signed)
Counseled to quit.  Pt moving towards COPD quickly

## 2013-11-12 NOTE — Assessment & Plan Note (Signed)
Severe oa bilat knees Recent steroid injections w/ benefit Start meloxicam and tramadol PRN when pain returns F/u w/ ortho next week for surgery consult

## 2013-11-12 NOTE — Patient Instructions (Addendum)
You are doing well overall. Please continue with your evaluation by the surgeons for your knee pain Please only use the tramadol and meloxicam as needed for knee pain Please continue to try to diet and exercise as able (pool or bike) Please try to quit smoking. Come back to see me in 2-3 months to discuss your overall health before I leave.

## 2013-11-19 ENCOUNTER — Ambulatory Visit (INDEPENDENT_AMBULATORY_CARE_PROVIDER_SITE_OTHER): Payer: PRIVATE HEALTH INSURANCE | Admitting: Sports Medicine

## 2013-11-19 ENCOUNTER — Encounter: Payer: Self-pay | Admitting: Sports Medicine

## 2013-11-19 VITALS — BP 149/89 | Ht 64.0 in | Wt 250.0 lb

## 2013-11-19 DIAGNOSIS — IMO0002 Reserved for concepts with insufficient information to code with codable children: Secondary | ICD-10-CM

## 2013-11-19 DIAGNOSIS — M179 Osteoarthritis of knee, unspecified: Secondary | ICD-10-CM

## 2013-11-19 DIAGNOSIS — M1710 Unilateral primary osteoarthritis, unspecified knee: Secondary | ICD-10-CM

## 2013-11-19 NOTE — Progress Notes (Signed)
   Subjective:    Patient ID: Kendra [name], female    DOB: 26-Oct-1965, 48 y.o.   MRN: [id number]  HPI Patient comes in today for followup on bilateral knee pain. Pain is much improved after recent cortisone injections. She is also here to discuss x-ray findings of her lumbar spine.    Review of Systems     Objective:   Physical Exam Well-developed, obese. No acute distress. Sitting comfortable in exam room.  Examination of each knee shows good range of motion. No effusion. No tenderness to palpation along either medial or lateral joint lines. Knees are stable to ligamentous exam grossly. Neurovascularly intact distally.  X-rays of her lumbar spine including AP and lateral views are reviewed. She has mild degenerative changes but nothing acute.      Assessment & Plan:  1. Improved bilateral knee pain secondary to DJD 2. Mild lumbar spine DJD  Patient's knees are feeling better after recent cortisone injections and her low back pain is tolerable. Therefore, I recommend no further workup or treatment at this time. Patient will followup with me when necessary.

## 2013-12-01 ENCOUNTER — Telehealth: Payer: Self-pay | Admitting: *Deleted

## 2013-12-01 NOTE — Telephone Encounter (Signed)
Pt called stating her right side is very painful and she can't hardly walk.  When walking she feels like something is very heavy on that side.  Pain x 5 days now.  Advised pt to make an appt to see a provider for evaluation today, but declined.  Pt stated she could come in on Thursday.  Appt 12/03/2013 at 9:45 AM made.  Pt also told to go to urgent care or ED is pain persist or worsen before appt.  Pt told to take pain medication as prescribed.  Derl Barrow, RN

## 2013-12-03 ENCOUNTER — Ambulatory Visit: Payer: PRIVATE HEALTH INSURANCE | Admitting: Family Medicine

## 2013-12-17 ENCOUNTER — Ambulatory Visit (INDEPENDENT_AMBULATORY_CARE_PROVIDER_SITE_OTHER): Payer: PRIVATE HEALTH INSURANCE | Admitting: Family Medicine

## 2013-12-17 ENCOUNTER — Encounter: Payer: Self-pay | Admitting: Family Medicine

## 2013-12-17 VITALS — BP 167/76 | HR 88 | Temp 97.8°F | Wt 282.0 lb

## 2013-12-17 DIAGNOSIS — R1011 Right upper quadrant pain: Secondary | ICD-10-CM

## 2013-12-17 DIAGNOSIS — R109 Unspecified abdominal pain: Secondary | ICD-10-CM

## 2013-12-17 LAB — COMPREHENSIVE METABOLIC PANEL
ALT: 12 U/L (ref 0–35)
AST: 16 U/L (ref 0–37)
Albumin: 3.8 g/dL (ref 3.5–5.2)
Alkaline Phosphatase: 66 U/L (ref 39–117)
BILIRUBIN TOTAL: 0.4 mg/dL (ref 0.2–1.2)
BUN: 11 mg/dL (ref 6–23)
CHLORIDE: 109 meq/L (ref 96–112)
CO2: 22 meq/L (ref 19–32)
Calcium: 9.3 mg/dL (ref 8.4–10.5)
Creat: 0.77 mg/dL (ref 0.50–1.10)
Glucose, Bld: 90 mg/dL (ref 70–99)
Potassium: 4.4 mEq/L (ref 3.5–5.3)
SODIUM: 138 meq/L (ref 135–145)
TOTAL PROTEIN: 6.6 g/dL (ref 6.0–8.3)

## 2013-12-17 LAB — POCT URINALYSIS DIPSTICK
BILIRUBIN UA: NEGATIVE
Glucose, UA: NEGATIVE
Ketones, UA: NEGATIVE
LEUKOCYTES UA: NEGATIVE
NITRITE UA: NEGATIVE
PROTEIN UA: NEGATIVE
Spec Grav, UA: 1.025
Urobilinogen, UA: 0.2
pH, UA: 5.5

## 2013-12-17 LAB — CBC
HCT: 26.4 % — ABNORMAL LOW (ref 36.0–46.0)
Hemoglobin: 7.6 g/dL — ABNORMAL LOW (ref 12.0–15.0)
MCH: 20.3 pg — ABNORMAL LOW (ref 26.0–34.0)
MCHC: 28.8 g/dL — ABNORMAL LOW (ref 30.0–36.0)
MCV: 70.4 fL — ABNORMAL LOW (ref 78.0–100.0)
PLATELETS: 338 10*3/uL (ref 150–400)
RBC: 3.75 MIL/uL — ABNORMAL LOW (ref 3.87–5.11)
RDW: 17.4 % — AB (ref 11.5–15.5)
WBC: 5.6 10*3/uL (ref 4.0–10.5)

## 2013-12-17 LAB — POCT UA - MICROSCOPIC ONLY

## 2013-12-17 LAB — LIPASE: Lipase: 10 U/L (ref 0–75)

## 2013-12-17 MED ORDER — TRAMADOL HCL 50 MG PO TABS: 50.0000 mg | ORAL_TABLET | Freq: Two times a day (BID) | ORAL | Status: DC | PRN

## 2013-12-17 NOTE — Patient Instructions (Signed)
Take pain medicine as needed. Drink lots of water.  I will call you with your ultrasound results. But we may need the CT scan if it does not show anything and you are still having pain.  Kendra [name] M. Antario Yasuda, M.D.

## 2013-12-17 NOTE — Assessment & Plan Note (Signed)
A: RUQ/RLQ abdominal pain. No peritoneal signs, N/V/D or fevers. DDx includes gallbladder, kidney stone (blood in urine), liver due to alcohol use or appendicitis.  P: - CMet, CBC and Lipase now - Abd U/s ordered. Discussed with radiologist who agrees they will likely be able to visualize most structures. If not, will need CT scan and patient agrees. - Tramadol prn pain - Encouraged to decrease alcohol intake - F/u in 1 week if not improved

## 2013-12-17 NOTE — Progress Notes (Signed)
Patient ID: Kendra [name], female   DOB: 26-Oct-1965, 48 y.o.   MRN: [id number]    Subjective: HPI: Patient is a 48 y.o. female presenting to clinic today for right sided abdominal pain.  Abdominal Pain Patient complains of abdominal pain. The pain is located in the RUQ and in the RLQ. The pain is described as sharp, and is 10/10 in intensity. Onset was 1 month ago. Symptoms have been gradually worsening since. Aggravating factors include recumbency and walking.  Alleviating factors include none. Associated symptoms include none. The patient denies constipation, diarrhea, fever, nausea and vomiting. She has tried ibuprofen and Tramadol, which helps some.   Never had anything like this before. Only had Csection with BTL, no other surgeries. No dysuria, or strong smelling urine. No unintentional weight loss. She states she has been drinking more alcohol than usual, three 40oz beers daily.  History Reviewed: Daily smoker. Health Maintenance: UTD  ROS: Please see HPI above.  Objective: Office vital signs reviewed. BP 167/76  Pulse 88  Temp(Src) 97.8 F (36.6 C) (Oral)  Wt 282 lb (127.914 kg)  LMP 12/10/2013  Physical Examination:  General: Awake, alert. NAD. Able to get on exam table without difficulty HEENT: Atraumatic, normocephalic. MMM. Neck: No masses palpated. No LAD Pulm: CTAB, no wheezes Cardio: RRR, no murmurs appreciated Abdomen: Obese, +BS. No guarding. TTP RUQ and RLQ. Mild right CVA tenderness. No rebound. No epigastric tenderness Neuro: Strength and sensation grossly intact  Results for orders placed in visit on 12/17/13 (from the past 24 hour(s))  POCT URINALYSIS DIPSTICK     Status: Abnormal   Collection Time    12/17/13  9:08 AM      Result Value Ref Range   Color, UA YELLOW     Clarity, UA CLEAR     Glucose, UA NEG     Bilirubin, UA NEG     Ketones, UA NEG     Spec Grav, UA 1.025     Blood, UA TRACE-INTACT     pH, UA 5.5     Protein, UA NEG     Urobilinogen, UA 0.2     Nitrite, UA NEG     Leukocytes, UA Negative    POCT UA - MICROSCOPIC ONLY     Status: None   Collection Time    12/17/13  9:08 AM      Result Value Ref Range   WBC, Ur, HPF, POC 0-3     RBC, urine, microscopic 0-3     Bacteria, U Microscopic 1+     Epithelial cells, urine per micros 5-10     Casts, Ur, LPF, POC 0-3 hyaline      Assessment: 48 y.o. female with abdominal pain  Plan: See Problem List and After Visit Summary

## 2013-12-18 ENCOUNTER — Telehealth: Payer: Self-pay | Admitting: Family Medicine

## 2013-12-18 ENCOUNTER — Encounter: Payer: Self-pay | Admitting: Family Medicine

## 2013-12-18 NOTE — Telephone Encounter (Signed)
Could you please let Kendra [name] know that her labs did not show anything other than her chronic anemia (low blood count.) It did not show any reason for her abdomen to be hurting.  Dr. Marily Memos would like for her to come to see him about her anemia, but she should still be taking her iron.  I will send her a letter with all of her results so she can see them as well.  Thanks, [employer] [place], M.D.

## 2013-12-18 NOTE — Telephone Encounter (Signed)
LMOVM for pt to return call. Jessica D Fleeger  

## 2013-12-22 ENCOUNTER — Ambulatory Visit (HOSPITAL_COMMUNITY)
Admission: RE | Admit: 2013-12-22 | Discharge: 2013-12-22 | Disposition: A | Discharge status: Home / Self Care | Payer: PRIVATE HEALTH INSURANCE | Source: Ambulatory Visit | Attending: Family Medicine | Admitting: Family Medicine

## 2013-12-22 ENCOUNTER — Telehealth: Payer: Self-pay | Admitting: Family Medicine

## 2013-12-22 DIAGNOSIS — R109 Unspecified abdominal pain: Secondary | ICD-10-CM | POA: Insufficient documentation

## 2013-12-22 NOTE — Telephone Encounter (Signed)
Could you please let Kendra [name] know that her ultrasound was normal and does not show a reason for her pain. If she is still having problems in the next week or so, she should follow up.  Thank you! Omran Keelin M. Alim Cattell, M.D.

## 2013-12-23 ENCOUNTER — Telehealth: Payer: Self-pay | Admitting: Family Medicine

## 2013-12-23 NOTE — Telephone Encounter (Signed)
Attempted to call patient back.  Number listed in chart/phone note os not correct ([phone]).  Female who answered the phone tells me that there is not a person living there by that name.  Deleted number from chart.  Will await callback. [name] Kendra [name]

## 2013-12-23 NOTE — Telephone Encounter (Signed)
LMOVM for pt to return call.  Please inform of the below. Ercilia Bettinger D Edric Fetterman  

## 2013-12-23 NOTE — Telephone Encounter (Signed)
Wants test results

## 2013-12-28 NOTE — Telephone Encounter (Signed)
LM for patient to call back.  Her ultrasound was normal.  Per MD it didn't show reason for pain.  Please help with scheduling a follow up appt is pain is still present.  Thanks [employer]

## 2013-12-28 NOTE — Telephone Encounter (Signed)
Patient is distressed from pain to side.  Need to know what the results from test. Left number at her mother's house to call.  She will be there

## 2013-12-31 ENCOUNTER — Ambulatory Visit (INDEPENDENT_AMBULATORY_CARE_PROVIDER_SITE_OTHER): Payer: PRIVATE HEALTH INSURANCE | Admitting: Family Medicine

## 2013-12-31 ENCOUNTER — Encounter: Payer: Self-pay | Admitting: Family Medicine

## 2013-12-31 VITALS — BP 157/81 | HR 109 | Temp 98.4°F | Ht 64.0 in | Wt 279.0 lb

## 2013-12-31 DIAGNOSIS — F1010 Alcohol abuse, uncomplicated: Secondary | ICD-10-CM

## 2013-12-31 DIAGNOSIS — D509 Iron deficiency anemia, unspecified: Secondary | ICD-10-CM

## 2013-12-31 DIAGNOSIS — R109 Unspecified abdominal pain: Secondary | ICD-10-CM

## 2013-12-31 LAB — ANEMIA PANEL
ABS Retic: 78.4 10*3/uL (ref 19.0–186.0)
FOLATE: 7.4 ng/mL
Ferritin: 1 ng/mL — ABNORMAL LOW (ref 10–291)
Iron: <10 ug/dL — ABNORMAL LOW (ref 42–145)
RBC.: 3.92 MIL/uL (ref 3.87–5.11)
RETIC CT PCT: 2 % (ref 0.4–2.3)
UIBC: 513 ug/dL — ABNORMAL HIGH (ref 125–400)
VITAMIN B 12: 286 pg/mL (ref 211–911)

## 2013-12-31 LAB — COMPREHENSIVE METABOLIC PANEL
ALT: 14 U/L (ref 0–35)
AST: 19 U/L (ref 0–37)
Albumin: 3.9 g/dL (ref 3.5–5.2)
Alkaline Phosphatase: 62 U/L (ref 39–117)
BUN: 12 mg/dL (ref 6–23)
CO2: 22 meq/L (ref 19–32)
CREATININE: 0.76 mg/dL (ref 0.50–1.10)
Calcium: 9.5 mg/dL (ref 8.4–10.5)
Chloride: 109 mEq/L (ref 96–112)
Glucose, Bld: 94 mg/dL (ref 70–99)
Potassium: 4.5 mEq/L (ref 3.5–5.3)
SODIUM: 138 meq/L (ref 135–145)
Total Bilirubin: 0.5 mg/dL (ref 0.2–1.2)
Total Protein: 6.5 g/dL (ref 6.0–8.3)

## 2013-12-31 LAB — LIPASE: LIPASE: 17 U/L (ref 0–75)

## 2013-12-31 LAB — POCT HEMOGLOBIN: Hemoglobin: 7.6 g/dL — AB (ref 12.2–16.2)

## 2013-12-31 MED ORDER — GABAPENTIN 100 MG PO CAPS: 100.0000 mg | ORAL_CAPSULE | Freq: Three times a day (TID) | ORAL | Status: DC

## 2013-12-31 MED ORDER — FERROUS SULFATE 220 (44 FE) MG/5ML PO ELIX: 220.0000 mg | ORAL_SOLUTION | Freq: Three times a day (TID) | ORAL | Status: DC

## 2013-12-31 NOTE — Assessment & Plan Note (Signed)
Continues to gain wt Advised pt to meet w/ Dr. Jenne Campus for nutrition counseling Pt amenable and given contact info for Dr. Jenne Campus

## 2013-12-31 NOTE — Assessment & Plan Note (Addendum)
Etiology unclear.  [place] nml outside of fatty liver Exam today w/o deep abdominal pain. Only ttp alon the R superficial abdominal wall. Unlikely musculoskeltal, gall bladder, appendix, constipation May be generalized nerve irritation, panniculitis, psychosomatic, pancreatitis [place]).  CMET again today w/ a lipase Cont NSAIDs and meloxicam prn Start Gabapentin

## 2013-12-31 NOTE — Patient Instructions (Addendum)
You are doing well overall The cause of you abdominal pain is not clear This may be from general nerve irritation. Please start the gabapentin for this, but know this medication may make you a little sleepy Please restart your iron (3 times a day), please take this with vitamin C if possible. Please come back in 4 weeks for repeat on your labs and follow up for your pain. Please call Dr. Jenne Campus for a nutrition appointment

## 2013-12-31 NOTE — Progress Notes (Signed)
Kendra [name] is a 48 y.o. female who presents to [facility] today for Side pain   Side pain: SEE PREVIOUS CLINIC NOTE. Ongoing. Now present for 6 wks. Pain is constant. No change w/ lying, movement, stooling, eating. Wakes up at night. Stool is soft and passes daily. Non-melonic. Tramadol w/ some benefit. Now drinking 1, 40oz daily. Tolerating PO. Denies dysuria or frequency.   Anemia: stool hemocult cards from 9/13 neg. Not takking iron pills. Periods are every 28 days. LMP 12/01/13. Periods last 5 days, w/ 3 heavy. Goes through 7-8 day pads on heavy days. HGB 7.6 today.   The following portions of the patient's history were reviewed and updated as appropriate: allergies, current medications, past medical history, family and social history, and problem list.  Patient is a smoker  Past Medical History  Diagnosis Date  . Anemia     ROS as above otherwise neg.    Medications reviewed. Current Outpatient Prescriptions  Medication Sig Dispense Refill  . albuterol (PROVENTIL HFA;VENTOLIN HFA) 108 (90 BASE) MCG/ACT inhaler Inhale 2 puffs into the lungs every 4 (four) hours as needed for wheezing. Use with spacer with every puff  2 Inhaler  3  . gabapentin (NEURONTIN) 100 MG capsule Take 1 capsule (100 mg total) by mouth 3 (three) times daily.  90 capsule  0  . ibuprofen (ADVIL,MOTRIN) 600 MG tablet Take 1 tablet (600 mg total) by mouth every 8 (eight) hours as needed for pain.  30 tablet  0  . meloxicam (MOBIC) 15 MG tablet One tab PO qAM with breakfast for 2 weeks, then daily prn pain.  30 tablet  3  . traMADol (ULTRAM) 50 MG tablet Take 1 tablet (50 mg total) by mouth 2 (two) times daily as needed.  60 tablet  1   No current facility-administered medications for this visit.    Exam:  BP 157/81  Pulse 109  Temp(Src) 98.4 F (36.9 C) (Oral)  Ht 5\' 4"  (1.626 m)  Wt 279 lb (126.554 kg)  BMI 47.87 kg/m2  LMP 12/10/2013 Gen: Well NAD HEENT: EOMI,  MMM Lungs: CTABL Nl WOB Heart: RRR no  MRG YHC:WCBJ, murphy's sign neg, no pain at mcburney's point, no mass. ttp along the superficial subcutaneous tissue Exts: Non edematous BL  LE, warm and well perfused.   Results for orders placed in visit on 12/31/13 (from the past 72 hour(s))  POCT HEMOGLOBIN     Status: Abnormal   Collection Time    12/31/13  9:41 AM      Result Value Ref Range   Hemoglobin 7.6 (*) 12.2 - 16.2 g/dL   Comment: capillary sample    A/P (as seen in Problem list)  Morbid obesity Continues to gain wt Advised pt to meet w/ Dr. Jenne Campus for nutrition counseling Pt amenable and given contact info for Dr. Jenne Campus   ANEMIA, IRON DEFICIENCY, UNSPEC. Continues to worsen Microcytic anemia down to 7.5.  Recheck x2 on finger H/H today confirmed anemia Pt has been off iron for several months Denies any sypmtmos of anemia Stool Hemocult neg in September May be from menstrual periods vs iron deficiency vs chronic disease vs ETOH/fatty liver. Restart iron TID and recheck in 1 month.  Consider GYN referral in future for possible eval/ablation, though periods are now more regular than previously.   ALCOHOL ABUSE Continues to drink 1 40oz daily. This amount increases on a regular basis Would like to quit but struggles due to home situation.   Abdominal pain  Etiology unclear.  [place] nml outside of fatty liver Exam today w/o deep abdominal pain. Only ttp alon the R superficial abdominal wall. Unlikely musculoskeltal, gall bladder, appendix, constipation May be generalized nerve irritation, panniculitis, psychosomatic, pancreatitis [place]).  CMET again today w/ a lipase Cont NSAIDs and meloxicam prn Start Gabapentin

## 2013-12-31 NOTE — Assessment & Plan Note (Signed)
Continues to drink 1 40oz daily. This amount increases on a regular basis Would like to quit but struggles due to home situation.

## 2013-12-31 NOTE — Addendum Note (Signed)
Addended by: [place], Casten Floren on: 12/31/2013 10:30 AM   Modules accepted: Orders

## 2013-12-31 NOTE — Assessment & Plan Note (Signed)
Continues to worsen Microcytic anemia down to 7.5.  Recheck x2 on finger H/H today confirmed anemia Pt has been off iron for several months Denies any sypmtmos of anemia Stool Hemocult neg in September May be from menstrual periods vs iron deficiency vs chronic disease vs ETOH/fatty liver. Restart iron TID and recheck in 1 month.  Consider GYN referral in future for possible eval/ablation, though periods are now more regular than previously.

## 2014-01-21 ENCOUNTER — Telehealth: Payer: Self-pay | Admitting: Family Medicine

## 2014-01-21 DIAGNOSIS — G8929 Other chronic pain: Secondary | ICD-10-CM

## 2014-01-21 DIAGNOSIS — M545 Low back pain: Principal | ICD-10-CM

## 2014-01-21 NOTE — Telephone Encounter (Signed)
Pt still having problems with her right side.  Now have numbness on that side radiating down her legs.  More needlelike sensation when she is lying down on that side.  Very painful.  Medication given helped a little but still concerned about what is causing problem.  Please contact pt at earliest convenience.

## 2014-01-26 NOTE — Telephone Encounter (Signed)
MRI scheduled for 6/11 at 3pm at [facility], patient aware.

## 2014-01-26 NOTE — Telephone Encounter (Signed)
Called and talked to pt Now experiencing R hip and inner and posterior thigh numbness, and weakness.  Denies loss of bowel or bladder function.   - MRI schedled of lumbar spine to evaluate impingement and spinal pathology Pt aware of need to go to ED if worsens prior to MRI Support staff to call to schedule next week in the morning.   Linna Darner, MD Family Medicine PGY-3 01/26/2014, 2:53 PM

## 2014-01-26 NOTE — Assessment & Plan Note (Addendum)
Called and talked to pt  Now experiencing R hip and inner and posterior thigh numbness, and weakness.  Denies loss of bowel or bladder function.  Acute worsening. Concern for nerve impingement, spine pathology MRI scheduled for next week (per pt preference) Pt to seek emergent evaluation if worsens

## 2014-02-01 ENCOUNTER — Other Ambulatory Visit: Payer: Self-pay | Admitting: Family Medicine

## 2014-02-01 DIAGNOSIS — G8929 Other chronic pain: Secondary | ICD-10-CM

## 2014-02-01 DIAGNOSIS — M545 Low back pain, unspecified: Secondary | ICD-10-CM

## 2014-02-04 ENCOUNTER — Ambulatory Visit (HOSPITAL_COMMUNITY): Admission: RE | Admit: 2014-02-04 | Payer: PRIVATE HEALTH INSURANCE | Source: Ambulatory Visit

## 2014-02-09 ENCOUNTER — Telehealth: Payer: Self-pay | Admitting: *Deleted

## 2014-02-09 NOTE — Telephone Encounter (Signed)
Message copied by Valerie Roys on Tue Feb 09, 2014  3:47 PM ------      Message from: [facility], DAVID J      Created: Tue Feb 09, 2014  3:06 PM       Please remind Ms. Sorber that I need to see her before the end of the month. She was supposed to f/u 7 days ago for repeat labs. Can you remind her also that she needs to be on daily iron.                   ----- Message -----         From: Bonnie Martinique         Sent: 12/31/2013  10:05 AM           To: Waldemar Dickens, MD                   ------

## 2014-02-09 NOTE — Telephone Encounter (Signed)
LM for patient to call back and schedule a follow up appt to see MD. Kendra [name]

## 2014-02-10 ENCOUNTER — Ambulatory Visit (HOSPITAL_COMMUNITY)
Admission: RE | Admit: 2014-02-10 | Discharge: 2014-02-10 | Disposition: A | Discharge status: Home / Self Care | Payer: PRIVATE HEALTH INSURANCE | Source: Ambulatory Visit | Attending: Family Medicine | Admitting: Family Medicine

## 2014-02-10 DIAGNOSIS — M545 Low back pain, unspecified: Secondary | ICD-10-CM | POA: Insufficient documentation

## 2014-02-10 DIAGNOSIS — M7130 Other bursal cyst, unspecified site: Secondary | ICD-10-CM | POA: Insufficient documentation

## 2014-02-10 DIAGNOSIS — G8929 Other chronic pain: Secondary | ICD-10-CM

## 2014-02-10 DIAGNOSIS — D259 Leiomyoma of uterus, unspecified: Secondary | ICD-10-CM | POA: Insufficient documentation

## 2014-02-10 MED ORDER — GADOBENATE DIMEGLUMINE 529 MG/ML IV SOLN
20.0000 mL | Freq: Once | INTRAVENOUS | Status: AC | PRN
  Administered 2014-02-10: 20 mL via INTRAVENOUS

## 2014-02-18 ENCOUNTER — Other Ambulatory Visit: Payer: Self-pay | Admitting: *Deleted

## 2014-02-18 MED ORDER — GABAPENTIN 100 MG PO CAPS: 100.0000 mg | ORAL_CAPSULE | Freq: Three times a day (TID) | ORAL | Status: DC

## 2014-03-09 ENCOUNTER — Emergency Department (HOSPITAL_COMMUNITY)
Admission: EM | Admit: 2014-03-09 | Discharge: 2014-03-09 | Disposition: A | Discharge status: Home / Self Care | Payer: Medicare Other | Attending: Emergency Medicine | Admitting: Emergency Medicine

## 2014-03-09 ENCOUNTER — Emergency Department (HOSPITAL_COMMUNITY): Payer: Medicare Other

## 2014-03-09 ENCOUNTER — Encounter (HOSPITAL_COMMUNITY): Payer: Self-pay | Admitting: Emergency Medicine

## 2014-03-09 DIAGNOSIS — R1012 Left upper quadrant pain: Secondary | ICD-10-CM | POA: Insufficient documentation

## 2014-03-09 DIAGNOSIS — R1013 Epigastric pain: Secondary | ICD-10-CM | POA: Diagnosis not present

## 2014-03-09 DIAGNOSIS — R079 Chest pain, unspecified: Secondary | ICD-10-CM

## 2014-03-09 DIAGNOSIS — D649 Anemia, unspecified: Secondary | ICD-10-CM | POA: Diagnosis not present

## 2014-03-09 DIAGNOSIS — Z791 Long term (current) use of non-steroidal anti-inflammatories (NSAID): Secondary | ICD-10-CM | POA: Diagnosis not present

## 2014-03-09 DIAGNOSIS — R0789 Other chest pain: Secondary | ICD-10-CM | POA: Insufficient documentation

## 2014-03-09 DIAGNOSIS — R1011 Right upper quadrant pain: Secondary | ICD-10-CM | POA: Insufficient documentation

## 2014-03-09 DIAGNOSIS — F17200 Nicotine dependence, unspecified, uncomplicated: Secondary | ICD-10-CM | POA: Diagnosis not present

## 2014-03-09 DIAGNOSIS — Z79899 Other long term (current) drug therapy: Secondary | ICD-10-CM | POA: Diagnosis not present

## 2014-03-09 LAB — COMPREHENSIVE METABOLIC PANEL
ALBUMIN: 3.5 g/dL (ref 3.5–5.2)
ALT: 11 U/L (ref 0–35)
ANION GAP: 13 (ref 5–15)
AST: 17 U/L (ref 0–37)
Alkaline Phosphatase: 68 U/L (ref 39–117)
BILIRUBIN TOTAL: 0.4 mg/dL (ref 0.3–1.2)
BUN: 9 mg/dL (ref 6–23)
CHLORIDE: 107 meq/L (ref 96–112)
CO2: 23 mEq/L (ref 19–32)
Calcium: 9.8 mg/dL (ref 8.4–10.5)
Creatinine, Ser: 0.82 mg/dL (ref 0.50–1.10)
GFR calc Af Amer: >90 mL/min (ref >90)
GFR calc non Af Amer: 84 mL/min — ABNORMAL LOW (ref >90)
Glucose, Bld: 93 mg/dL (ref 70–99)
Potassium: 4.5 mEq/L (ref 3.7–5.3)
Sodium: 143 mEq/L (ref 137–147)
TOTAL PROTEIN: 6.8 g/dL (ref 6.0–8.3)

## 2014-03-09 LAB — CBC WITH DIFFERENTIAL/PLATELET
BASOS ABS: 0.1 10*3/uL (ref 0.0–0.1)
Basophils Relative: 1 % (ref 0–1)
EOS ABS: 0 10*3/uL (ref 0.0–0.7)
Eosinophils Relative: 0 % (ref 0–5)
HCT: 26.4 % — ABNORMAL LOW (ref 36.0–46.0)
Hemoglobin: 7.5 g/dL — ABNORMAL LOW (ref 12.0–15.0)
Lymphocytes Relative: 17 % (ref 12–46)
Lymphs Abs: 1.4 10*3/uL (ref 0.7–4.0)
MCH: 21.2 pg — ABNORMAL LOW (ref 26.0–34.0)
MCHC: 28.4 g/dL — AB (ref 30.0–36.0)
MCV: 74.6 fL — ABNORMAL LOW (ref 78.0–100.0)
MONOS PCT: 6 % (ref 3–12)
Monocytes Absolute: 0.5 10*3/uL (ref 0.1–1.0)
NEUTROS ABS: 6 10*3/uL (ref 1.7–7.7)
Neutrophils Relative %: 76 % (ref 43–77)
PLATELETS: 339 10*3/uL (ref 150–400)
RBC: 3.54 MIL/uL — ABNORMAL LOW (ref 3.87–5.11)
RDW: 17.1 % — AB (ref 11.5–15.5)
WBC: 8 10*3/uL (ref 4.0–10.5)

## 2014-03-09 LAB — LIPASE, BLOOD: LIPASE: 19 U/L (ref 11–59)

## 2014-03-09 LAB — I-STAT TROPONIN, ED: Troponin i, poc: 0 ng/mL (ref 0.00–0.08)

## 2014-03-09 MED ORDER — OXYCODONE-ACETAMINOPHEN 5-325 MG PO TABS
1.0000 | ORAL_TABLET | Freq: Once | ORAL | Status: AC
  Administered 2014-03-09: 1 via ORAL
  Filled 2014-03-09: qty 1

## 2014-03-09 MED ORDER — GI COCKTAIL ~~LOC~~
30.0000 mL | Freq: Once | ORAL | Status: AC
  Administered 2014-03-09: 30 mL via ORAL
  Filled 2014-03-09: qty 30

## 2014-03-09 MED ORDER — SUCRALFATE 1 GM/10ML PO SUSP: 1.0000 g | Freq: Three times a day (TID) | ORAL | Status: DC

## 2014-03-09 MED ORDER — OMEPRAZOLE 20 MG PO CPDR: 20.0000 mg | DELAYED_RELEASE_CAPSULE | Freq: Every day | ORAL | Status: DC

## 2014-03-09 MED ORDER — ONDANSETRON HCL 4 MG PO TABS: 4.0000 mg | ORAL_TABLET | Freq: Four times a day (QID) | ORAL | Status: DC

## 2014-03-09 MED ORDER — FAMOTIDINE 20 MG PO TABS
20.0000 mg | ORAL_TABLET | Freq: Once | ORAL | Status: AC
  Administered 2014-03-09: 20 mg via ORAL
  Filled 2014-03-09: qty 1

## 2014-03-09 NOTE — ED Notes (Signed)
Patient transported to X-ray 

## 2014-03-09 NOTE — ED Provider Notes (Signed)
CSN: [id number]     Arrival date & time 03/09/14  0300 History   First MD Initiated Contact with Patient 03/09/14 [phone]     Chief Complaint  Patient presents with  . Chest Pain  . Abdominal Pain     (Consider location/radiation/quality/duration/timing/severity/associated sxs/prior Treatment) HPI Kendra [name] is a 48 y.o. female who presents to emergency department complaining of chest pain and abdominal pain. Patient states she has had constant chest pain in the left side for 1 week. She states it is worsened with laying flat and better with sitting up. States it is also worse with taking deep breaths. States that this morning she developed epigastric abdominal pain with nausea and vomiting. Patient denies any cough. She denies any fever or chills. She denies any urinary symptoms. She took Maalox for her symptoms which did not help. She denies any back pain. Normal bowel movements, last this morning. She denies any prior similar pain. She denies any recent travel or surgeries. She denies taking birth control. No other complaints.  Past Medical History  Diagnosis Date  . Anemia    Past Surgical History  Procedure Laterality Date  . Cesarean section    . Tubal ligation    . Tibia fracture surgery     Family History  Problem Relation Age of Onset  . Diabetes Mother   . Heart disease Mother   . Cancer Father 20    lung   History  Substance Use Topics  . Smoking status: Current Every Day Smoker -- 0.25 packs/day    Types: Cigarettes    Start date: 08/27/2002  . Smokeless tobacco: Never Used     Comment: cut back to 4-5 cigs per day from 1ppd  . Alcohol Use: 0.0 oz/week     Comment: 2 -40oz daily   OB History   Grav Para Term Preterm Abortions TAB SAB Ect Mult Living                 Review of Systems  Constitutional: Negative for fever and chills.  Respiratory: Positive for chest tightness. Negative for cough and shortness of breath.   Cardiovascular: Positive for chest  pain. Negative for palpitations and leg swelling.  Gastrointestinal: Positive for nausea, vomiting and abdominal pain. Negative for diarrhea.  Genitourinary: Negative for dysuria, flank pain, vaginal bleeding, vaginal discharge, vaginal pain and pelvic pain.  Musculoskeletal: Negative for arthralgias, myalgias, neck pain and neck stiffness.  Skin: Negative for rash.  Neurological: Negative for dizziness, weakness and headaches.  All other systems reviewed and are negative.     Allergies  Review of patient's allergies indicates no known allergies.  Home Medications   Prior to Admission medications   Medication Sig Start Date End Date Taking? Authorizing Provider  albuterol (PROVENTIL HFA;VENTOLIN HFA) 108 (90 BASE) MCG/ACT inhaler Inhale 2 puffs into the lungs every 4 (four) hours as needed for wheezing or shortness of breath.   Yes Historical Provider, MD  alum & mag hydroxide-simeth (MAALOX/MYLANTA) 200-200-20 MG/5ML suspension Take 30 mLs by mouth every 6 (six) hours as needed for indigestion or heartburn.   Yes Historical Provider, MD  gabapentin (NEURONTIN) 100 MG capsule Take 100 mg by mouth 3 (three) times daily.   Yes Historical Provider, MD  meloxicam (MOBIC) 15 MG tablet Take 15 mg by mouth daily after breakfast.   Yes Historical Provider, MD  traMADol (ULTRAM) 50 MG tablet Take 50 mg by mouth every 6 (six) hours as needed for moderate pain.  Yes Historical Provider, MD   BP 100/80  Pulse 92  Temp(Src) 98.5 F (36.9 C) (Oral)  Resp 18  Ht 5\' 4"  (1.626 m)  Wt 276 lb 7 oz (125.391 kg)  BMI 47.43 kg/m2  SpO2 99%  LMP 01/31/2014 Physical Exam  Nursing note and vitals reviewed. Constitutional: She is oriented to person, place, and time. She appears well-developed and well-nourished. No distress.  HENT:  Head: Normocephalic.  Eyes: Conjunctivae are normal.  Neck: Neck supple.  Cardiovascular: Normal rate, regular rhythm and normal heart sounds.   Pulmonary/Chest: Effort  normal and breath sounds normal. No respiratory distress. She has no wheezes. She has no rales.  Abdominal: Soft. Bowel sounds are normal. She exhibits no distension. There is tenderness. There is no rebound and no guarding.  Right upper quadrant, epigastric, left upper quadrant tenderness. No CVA tenderness.  Musculoskeletal: She exhibits no edema.  Neurological: She is alert and oriented to person, place, and time.  Skin: Skin is warm and dry.  Psychiatric: She has a normal mood and affect. Her behavior is normal.    ED Course  Procedures (including critical care time) Labs Review Labs Reviewed  CBC WITH DIFFERENTIAL - Abnormal; Notable for the following:    RBC 3.54 (*)    Hemoglobin 7.5 (*)    HCT 26.4 (*)    MCV 74.6 (*)    MCH 21.2 (*)    MCHC 28.4 (*)    RDW 17.1 (*)    All other components within normal limits  COMPREHENSIVE METABOLIC PANEL - Abnormal; Notable for the following:    GFR calc non Af Amer 84 (*)    All other components within normal limits  LIPASE, BLOOD  I-STAT TROPOININ, ED    Imaging Review Dg Chest 2 View  03/09/2014   CLINICAL DATA:  Chest pain, abdominal pain, vomiting  EXAM: CHEST  2 VIEW  COMPARISON:  05/05/2013  FINDINGS: The heart size and mediastinal contours are within normal limits. Both lungs are clear. The visualized skeletal structures are unremarkable.  IMPRESSION: No active cardiopulmonary disease.   Electronically Signed   By: Kathreen Devoid   On: 03/09/2014 10:59     EKG Interpretation None      MDM   Final diagnoses:  Chest pain, unspecified chest pain type  Epigastric pain  Anemia, unspecified anemia type   Patient is an emergency department with atypical chest pain, and has been constant for a week, worsened with laying down flat, now also with epigastric abdominal pain, nausea, vomiting. Pt is PERC and WELLS criteria negative. Patient is tender in epigastric and upper abdomen, no peritoneal signs. No acute abdomen. Will get  labs, chest x-ray, will try some GI cocktail  1:33 PM Patient's labs show anemia, patient is aware, history of the same, she is supposed to be taking iron supplements but states she cannot afford him but will try to get him and start on them again. Patient does admit to me now that her pain is similar to when she had gastric ulcers in the past. She was followed by gastroenterologist, states it was years ago. I suspect this could be the problem again given her symptoms. I have given her Pepcid and Percocet in emergency department for her symptoms. Will discharge her home with a PPI, Carafate, nausea medication, and instructed to followup closely with her primary care Dr. or gastroenterologist. Discussed diet and cessation of smoking. At this time I do not think any further imaging is indicated. Patient's  vital signs are normal. Stable for discharge with close followup  Filed Vitals:   03/09/14 1030 03/09/14 1045 03/09/14 1145 03/09/14 1245  BP: 100/80 115/47 113/90 119/52  Pulse: 92 87 76 98  Temp:      TempSrc:      Resp: 18 16    Height:      Weight:      SpO2: 99% 100% 100% 100%       Joshlynn Alfonzo A Kathalina Ostermann, PA-C 03/09/14 1521

## 2014-03-09 NOTE — ED Notes (Signed)
Pt. Stated, I started having slight chest pain a week ago and its getting worse and now Im having abdominal pain with N/V

## 2014-03-09 NOTE — Discharge Instructions (Signed)
Start taking prilosec daily, take carafate with meals. Zofran for nausea. Follow GERD diet. Follow up with primary care doctor or gastroenterologist for further treatment. Follow up with primary care regarding your anemia. Return if symptoms are worsening.   Food Choices for Gastroesophageal Reflux Disease When you have gastroesophageal reflux disease (GERD), the foods you eat and your eating habits are very important. Choosing the right foods can help ease the discomfort of GERD. WHAT GENERAL GUIDELINES DO I NEED TO FOLLOW?  Choose fruits, vegetables, whole grains, low-fat dairy products, and low-fat meat, fish, and poultry.  Limit fats such as oils, salad dressings, butter, nuts, and avocado.  Keep a food diary to identify foods that cause symptoms.  Avoid foods that cause reflux. These may be different for different people.  Eat frequent small meals instead of three large meals each day.  Eat your meals slowly, in a relaxed setting.  Limit fried foods.  Cook foods using methods other than frying.  Avoid drinking alcohol.  Avoid drinking large amounts of liquids with your meals.  Avoid bending over or lying down until 2-3 hours after eating. WHAT FOODS ARE NOT RECOMMENDED? The following are some foods and drinks that may worsen your symptoms: Vegetables Tomatoes. Tomato juice. Tomato and spaghetti sauce. Chili peppers. Onion and garlic. Horseradish. Fruits Oranges, grapefruit, and lemon (fruit and juice). Meats High-fat meats, fish, and poultry. This includes hot dogs, ribs, ham, sausage, salami, and bacon. Dairy Whole milk and chocolate milk. Sour cream. Cream. Butter. Ice cream. Cream cheese.  Beverages Coffee and tea, with or without caffeine. Carbonated beverages or energy drinks. Condiments Hot sauce. Barbecue sauce.  Sweets/Desserts Chocolate and cocoa. Donuts. Peppermint and spearmint. Fats and Oils High-fat foods, including [place] fries and potato  chips. Other Vinegar. Strong spices, such as black pepper, white pepper, red pepper, cayenne, curry powder, cloves, ginger, and chili powder. The items listed above may not be a complete list of foods and beverages to avoid. Contact your dietitian for more information. Document Released: 08/13/2005 Document Revised: 08/18/2013 Document Reviewed: 06/17/2013 [facility] Patient Information 2015 [place], [place]. This information is not intended to replace advice given to you by your health care provider. Make sure you discuss any questions you have with your health care provider.

## 2014-03-11 NOTE — ED Provider Notes (Signed)
Medical screening examination/treatment/procedure(s) were performed by non-physician practitioner and as supervising physician I was immediately available for consultation/collaboration.   EKG Interpretation None       Larinda Herter, MD 03/11/14 0733 

## 2014-05-17 ENCOUNTER — Other Ambulatory Visit: Payer: Self-pay | Admitting: *Deleted

## 2014-05-17 MED ORDER — GABAPENTIN 100 MG PO CAPS: 100.0000 mg | ORAL_CAPSULE | Freq: Three times a day (TID) | ORAL | Status: DC

## 2014-05-17 NOTE — Telephone Encounter (Signed)
Will refill Gabapentin script. But I would like to see this patient sometime soon. Patient has appointment on 06/04/14.

## 2014-06-04 ENCOUNTER — Ambulatory Visit: Payer: PRIVATE HEALTH INSURANCE | Admitting: Family Medicine

## 2014-07-01 ENCOUNTER — Ambulatory Visit: Payer: PRIVATE HEALTH INSURANCE | Admitting: Family Medicine

## 2014-07-06 ENCOUNTER — Ambulatory Visit (INDEPENDENT_AMBULATORY_CARE_PROVIDER_SITE_OTHER): Payer: Medicare Other | Admitting: Sports Medicine

## 2014-07-06 ENCOUNTER — Encounter: Payer: Self-pay | Admitting: Sports Medicine

## 2014-07-06 VITALS — BP 133/82 | Ht 64.0 in | Wt 270.0 lb

## 2014-07-06 DIAGNOSIS — IMO0002 Reserved for concepts with insufficient information to code with codable children: Secondary | ICD-10-CM

## 2014-07-06 DIAGNOSIS — M1710 Unilateral primary osteoarthritis, unspecified knee: Secondary | ICD-10-CM

## 2014-07-06 DIAGNOSIS — M179 Osteoarthritis of knee, unspecified: Secondary | ICD-10-CM

## 2014-07-06 DIAGNOSIS — M25511 Pain in right shoulder: Secondary | ICD-10-CM

## 2014-07-06 MED ORDER — METHYLPREDNISOLONE ACETATE 40 MG/ML IJ SUSP
40.0000 mg | Freq: Once | INTRAMUSCULAR | Status: AC
Start: 2014-07-06 — End: 2014-07-06
  Administered 2014-07-06: 40 mg via INTRA_ARTICULAR

## 2014-07-06 NOTE — Progress Notes (Signed)
   Subjective:    Patient ID: Kendra [name], female    DOB: 01/24/1966, 48 y.o.   MRN: [id number]  HPI chief complaint: Left knee pain  48 year old female comes in today complaining of returning left knee pain. She has a documented history of left knee DJD. Last x-rays were done in 2013 and showed moderately advanced medial compartmental narrowing of the medial compartment. She receives periodic cortisone injections which do help her. Her pain is identical in nature to what she's experienced previously. No recent trauma. She would like to have a repeat cortisone injection today. She is also complaining of some right-sided neck and shoulder pain which is been present for "a long time". No recent trauma. She has noticed decreased shoulder range of motion in addition to her pain. No numbness or tingling in the arm.    Review of Systems     Objective:   Physical Exam Obese. No acute distress.  Left knee: Range of motion 0-120. 1+ boggy synovitis. 1+ patellofemoral crepitus. Tender to palpation along the medial joint line but a negative McMurray's. No tenderness along the lateral joint line. Good ligamentous stability. Neurovascularly intact distally.       Assessment & Plan:  Returning left knee pain secondary to DJD Chronic right shoulder/neck pain   Patient's left knee is reinjected today with cortisone. An anterior lateral approach was utilized. I will send her for updated x-rays of her left knee and I'll also order an AP and axillary view of her right shoulder. She will follow-up in 3-4 weeks. At that time we will further evaluate her chronic right shoulder/neck issues.  Consent obtained and verified. Time-out conducted. Noted no overlying erythema, induration, or other signs of local infection. Skin prepped in a sterile fashion. Topical analgesic spray: Ethyl chloride. Joint: left knee Needle: 22g 1.5 inch  Completed without difficulty. Meds: 1cc (40mg ) depomedrol, 3cc 1%  xylocaine  Advised to call if fevers/chills, erythema, induration, drainage, or persistent bleeding.

## 2014-07-07 ENCOUNTER — Other Ambulatory Visit: Payer: Self-pay | Admitting: Sports Medicine

## 2014-07-07 ENCOUNTER — Ambulatory Visit
Admission: RE | Admit: 2014-07-07 | Discharge: 2014-07-07 | Disposition: A | Discharge status: Home / Self Care | Payer: PRIVATE HEALTH INSURANCE | Source: Ambulatory Visit | Attending: Sports Medicine | Admitting: Sports Medicine

## 2014-07-07 DIAGNOSIS — M1710 Unilateral primary osteoarthritis, unspecified knee: Secondary | ICD-10-CM

## 2014-07-07 DIAGNOSIS — IMO0002 Reserved for concepts with insufficient information to code with codable children: Secondary | ICD-10-CM

## 2014-07-07 DIAGNOSIS — M25511 Pain in right shoulder: Secondary | ICD-10-CM

## 2014-07-12 ENCOUNTER — Ambulatory Visit (INDEPENDENT_AMBULATORY_CARE_PROVIDER_SITE_OTHER): Payer: Medicare Other

## 2014-07-12 ENCOUNTER — Ambulatory Visit (INDEPENDENT_AMBULATORY_CARE_PROVIDER_SITE_OTHER): Payer: PRIVATE HEALTH INSURANCE | Admitting: Family Medicine

## 2014-07-12 ENCOUNTER — Encounter: Payer: Self-pay | Admitting: Family Medicine

## 2014-07-12 VITALS — BP 120/79 | HR 76 | Temp 97.8°F | Wt 271.6 lb

## 2014-07-12 DIAGNOSIS — R202 Paresthesia of skin: Secondary | ICD-10-CM

## 2014-07-12 DIAGNOSIS — R200 Anesthesia of skin: Secondary | ICD-10-CM

## 2014-07-12 DIAGNOSIS — G8929 Other chronic pain: Secondary | ICD-10-CM

## 2014-07-12 DIAGNOSIS — M545 Low back pain, unspecified: Secondary | ICD-10-CM

## 2014-07-12 DIAGNOSIS — Z23 Encounter for immunization: Secondary | ICD-10-CM

## 2014-07-12 MED ORDER — GABAPENTIN 100 MG PO CAPS: 100.0000 mg | ORAL_CAPSULE | Freq: Three times a day (TID) | ORAL | Status: DC

## 2014-07-12 MED ORDER — TRAMADOL HCL 50 MG PO TABS: 50.0000 mg | ORAL_TABLET | Freq: Four times a day (QID) | ORAL | Status: DC | PRN

## 2014-07-12 NOTE — Assessment & Plan Note (Signed)
Patient with long history of DDD. Pain is unchanged since last visit.  - No development of symptoms since last visit. - Nerve pain is well controlled with gabapentin at this time. Prescription was refilled today. - Breakthrough pain well-controlled with Ultram. Prescription was refilled today.  - I encouraged patient to seek out  Weight loss programs as I feel that if patient was to decrease her BMI she may significantly reduce the progression of her DDD.

## 2014-07-12 NOTE — Patient Instructions (Signed)
It was a pleasure seeing you today in our clinic. Today we discussed your back pain and right shoulder pain. Here is the treatment plan we have discussed and agreed upon together:   - Continue your current medication regimen. - I have refilled your medications. - We discussed the techniques you can try to reduce the pressure on some of these painful areas.

## 2014-07-12 NOTE — Progress Notes (Signed)
   HPI  Patient presents today for medication refill and meeting her new doctor. Patient is here to meet her new doctor and for medication refill. She has had no significant changes recently. Patient does state that she has had some difficulty with her right leg and right arm. This is been worked up in the past with an MRI and x-ray. MRI shows significant DDD in her lumbar spine specifically at the L4-5 region. Patient states that her right leg hurts constantly. She does not describe any electric pain shooting down below her knee. She denies any weakness in her extremities. She DOES however reports vague numbness at her right hip (this is not new and has not changed recently), but denies any other red flags like bowel or bladder incontinence.  Patient also reports that she has significant pain in her right shoulder. Pain radiates from right neck down to right mid arm. Pain is similar nature to that of her back/hip pain. She denies any weakness numbness in her upper extremities.  Patient states that her gabapentin prescription works very well controlling the pain in both her right hip and right shoulder/arm. No other issues at this time.   Smoking status noted ROS: Per HPI  Objective: BP 120/79 mmHg  Pulse 76  Temp(Src) 97.8 F (36.6 C) (Oral)  Wt 271 lb 9.6 oz (123.197 kg) Gen: NAD, alert, cooperative with exam, obese HEENT: NCAT, EOMI, PERRL CV: RRR, good S1/S2, no murmur Resp: CTABL, no wheezes, non-labored Abd: SNTND, BS present, no guarding or organomegaly Ext: No edema, warm, full ROM, reflexes +2 bilaterally throughout. Strength 5/5 bilaterally Neuro: Alert and oriented, No gross deficits, CN II-XII intact  Assessment and plan:  Chronic low back pain Patient with long history of DDD. Pain is unchanged since last visit.  - No development of symptoms since last visit. - Nerve pain is well controlled with gabapentin at this time. Prescription was refilled today. - Breakthrough  pain well-controlled with Ultram. Prescription was refilled today.  - I encouraged patient to seek out  Weight loss programs as I feel that if patient was to decrease her BMI she may significantly reduce the progression of her DDD.  Numbness and tingling of right arm most likely related to cervical radiculopathy No progression in symptoms since last visit. Nerve pain has been well controlled with gabapentin and Ultram for breakthrough pain.  - would benefit from possible steroid injection in the future. I believe she has had this in the past--we'll pursue if pain progresses.    No orders of the defined types were placed in this encounter.    Meds ordered this encounter  Medications  . gabapentin (NEURONTIN) 100 MG capsule    Sig: Take 1 capsule (100 mg total) by mouth 3 (three) times daily.    Dispense:  90 capsule    Refill:  3  . traMADol (ULTRAM) 50 MG tablet    Sig: Take 1 tablet (50 mg total) by mouth every 6 (six) hours as needed for moderate pain.    Dispense:  30 tablet    Refill:  0     Elberta Leatherwood, MD,MS,  PGY1 07/12/2014 5:36 PM

## 2014-07-12 NOTE — Assessment & Plan Note (Addendum)
No progression in symptoms since last visit. Nerve pain has been well controlled with gabapentin and Ultram for breakthrough pain.  - would benefit from possible steroid injection in the future. I believe she has had this in the past--we'll pursue if pain progresses.

## 2014-07-27 ENCOUNTER — Encounter: Payer: Self-pay | Admitting: Sports Medicine

## 2014-07-27 ENCOUNTER — Ambulatory Visit (INDEPENDENT_AMBULATORY_CARE_PROVIDER_SITE_OTHER): Payer: Medicare Other | Admitting: Sports Medicine

## 2014-07-27 VITALS — BP 136/84 | Ht 64.0 in | Wt 280.0 lb

## 2014-07-27 DIAGNOSIS — M25511 Pain in right shoulder: Secondary | ICD-10-CM

## 2014-07-27 MED ORDER — METHYLPREDNISOLONE ACETATE 40 MG/ML IJ SUSP
40.0000 mg | Freq: Once | INTRAMUSCULAR | Status: AC
  Administered 2014-07-27: 40 mg via INTRA_ARTICULAR

## 2014-07-27 NOTE — Progress Notes (Signed)
   Subjective:    Patient ID: Kendra [name], female    DOB: 11-08-1965, 48 y.o.   MRN: [id number]  HPI  Patient comes in today for follow-up on left knee pain and right shoulder pain. Left knee pain is better after recent cortisone injection. X-rays of the left knee show bone-on-bone medial compartmental DJD. She is here today to primarily discuss right shoulder pain that has been present now for about 3 months without any specific injury. She has pain with reaching overhead or around behind her back. She has begun to notice decreased range of motion as well. Pain sleeping on this shoulder at night as well. Pain is diffuse. No radiating pain down the arm. No associated numbness or tingling. She denies problems with this shoulder in the past. No prior shoulder surgeries. She states that she's had intermittent pain for the past 4 years but it has steadily gotten worse over the past 3 months. She has treated it with muscle relaxers which help only minimally. She has tramadol at home for her left knee pain but she has not yet tried it for her right shoulder pain. Recent x-rays of her right shoulder showed some mild AC arthropathy but a well-preserved glenohumeral joint. She is right-hand dominant.   Review of Systems     Objective:   Physical Exam Well-developed, well-nourished. No acute distress  Right shoulder: Active forward flexion is to 110. Active abduction is to 90. Active external rotation is 80. Active internal rotation is 70-80. Passively I am able to carry her into full 90 of external rotation and nearly 150 of abduction and forward flexion. No tenderness to palpation along the clavicle or ac joint. No tenderness over the bicipital groove. Positive empty can, positive Hawkins. Rotator cuff strength is 5/5 but reproducible pain with resisted supraspinatus. Neurovascularly intact distally.  Left shoulder: Full range of motion. No tenderness along the clavicle or ac joint. Rotator cuff  strength is 5/5. Negative empty can, negative Hawkins. Neurovascular intact distally.  X-rays of the left knee and the right shoulder are as above       Assessment & Plan:  Right shoulder pain likely secondary to rotator cuff tendinopathy Improved left knee pain secondary to advanced medial compartment DJD  I recommended a subacromial cortisone injection for the right shoulder today. Patient agrees. I've encouraged her to try her tramadol for her shoulder pain in addition to her knee pain. Follow-up with me in 3-4 weeks. If symptoms persist consider further diagnostic imaging. Definitive treatment for her left knee pain is a total knee arthroplasty but she is very young to consider that right now. Of note, I have agreed to complete an updated [place] form for her so that it will be easier for her to catch public transportation. I do not believe that she is able to comfortably walk more than a few feet at a time.  Consent obtained and verified. Time-out conducted. Noted no overlying erythema, induration, or other signs of local infection. Skin prepped in a sterile fashion. Topical analgesic spray: Ethyl chloride. Joint: right shoulder (subacromial) Needle: 25g 1.5 inch Completed without difficulty. Meds: 3cc 1% xylocaine, 1cc (40mg ) depomedrol  Advised to call if fevers/chills, erythema, induration, drainage, or persistent bleeding.

## 2014-08-24 ENCOUNTER — Encounter: Payer: Self-pay | Admitting: Sports Medicine

## 2014-08-24 ENCOUNTER — Ambulatory Visit (INDEPENDENT_AMBULATORY_CARE_PROVIDER_SITE_OTHER): Payer: PRIVATE HEALTH INSURANCE | Admitting: Sports Medicine

## 2014-08-24 VITALS — BP 119/58 | Ht 64.0 in | Wt 278.0 lb

## 2014-08-24 DIAGNOSIS — M25511 Pain in right shoulder: Secondary | ICD-10-CM

## 2014-08-24 DIAGNOSIS — M67911 Unspecified disorder of synovium and tendon, right shoulder: Secondary | ICD-10-CM

## 2014-08-24 MED ORDER — TRAMADOL HCL 50 MG PO TABS: 50.0000 mg | ORAL_TABLET | Freq: Four times a day (QID) | ORAL | Status: DC | PRN

## 2014-08-24 NOTE — Assessment & Plan Note (Signed)
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 Additionally, symptoms of R-neck pain and radicular pain into R-hand with numbness and tingling is suggestive of C-spine DJD etiology, with existing known disease. Patient currently most limited by pain and reduced ROM with Right shoulder.  Plan: 1. Ordered MRI Right shoulder eval rotator cuff, plan to review and f/u with patient once results available and RTC to discuss options 2. Refill Tramadol 3. Counseled on multifactorial nature of current symptoms, and goal to focus on Right shoulder, but may need future work-up for neck pain

## 2014-08-24 NOTE — Progress Notes (Signed)
Subjective:    Patient ID: Kendra [name], female    DOB: 20-Oct-1965, 48 y.o.   MRN: [id number]  HPI  RIGHT SHOULDER PAIN: - Patient presents to [facility] today for follow-up for same complaint, last seen 07/27/14, at that time shoulder pain believed to be due to likely Right rotator cuff tendinopathy, subracromial steroid injection performed (1st R-shoulder injection). Additionally pt continued on Tramadol, Gabapentin, Mobic. - Today patient reports that Right shoulder continues with persistent pain, similar to last visit 1 month ago. No significant relief from injection (mild improvement x 2-3 days) but then pain returned. Describes pain as chronic intermittent x 4 years, but worsening over past 2 months, no acute known injury, located on top near [facility] joint and anterior shoulder, with some aching down arm, pain is worst with movements above head or behind back, difficulty with lifting. Admits to night-time pain, difficulty sleeping. Some improvement with Tramadol 50mg  BID. - Last X-ray Right Shoulder (07/07/14) mild AC degeneration, otherwise no acute findings  RIGHT NECK PAIN: - Additionally complains of R > L radiating neck "burning pain" down her Right arm and into her fingers, associated with numbness and tingling. Admits to chronic history of this problem as well - Last imaging, C-spine MRI (03/16/12) - mild cervical disc and facet degeneration, C5-C6 with R-foraminal disc protrusion with mild spinal stenosis, mod-severe R-foraminal stenosis C4, C5, C8 - Continues on Gabapentin, previously on Flexeril without relief   Review of Systems     Objective:   Physical Exam Well-developed, well-nourished. No acute distress  Right shoulder: Active forward flexion is to 110. Active abduction is to 90. Active external rotation is 80. Active internal rotation is 70-80. Passively I am able to carry her into full 90 of external rotation and nearly 150 of abduction and forward flexion. No tenderness  to palpation along the clavicle or ac joint. No tenderness over the bicipital groove. Positive empty can, positive Hawkins. Rotator cuff strength is 5/5 but reproducible pain with resisted supraspinatus. Neurovascularly intact distally.  Left shoulder: Full range of motion. No tenderness along the clavicle or ac joint. Rotator cuff strength is 5/5. Negative empty can, negative Hawkins. Neurovascular intact distally.     Review of Systems  See above HPI    Objective:   Physical Exam  BP 119/58 mmHg  Ht 5\' 4"  (1.626 m)  Wt 278 lb (126.1 kg)  BMI 47.70 kg/m2  Gen - well-appearing, NAD Neck - mild tenderness to palpation R>L paraspinal cervical muscles, Spurling's negative (local pain but no radicular symptoms) MSK: - Right Shoulder: Normal appearance compared to Left without edema or deformity. Active ROM limited due to pain, forward flex to 120, abduction to 90, internal rotation significantly limited, passive ROM significantly improved but uncomfortable. Tenderness localized over anterior shoulder and Right C-spine / neck trapezius. No AC joint tenderness. Rotator cuff testing with positive empty can, positive Hawkin's impingement. Rotator cuff strength is intact 5/5, with mild reproducible pain Left shoulder with all testing including supraspinatus and biceps. 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 Additionally, symptoms of R-neck pain and radicular pain  into R-hand with numbness and tingling is suggestive of C-spine DJD etiology, with existing known disease. Patient currently most limited by pain and  reduced ROM with Right shoulder.  Plan: 1. Ordered MRI Right shoulder eval rotator cuff, plan to review and f/u with patient once results available and RTC to discuss options (will f/u via telephone) 2. Refill Tramadol 3. Counseled on multifactorial nature of current symptoms, and goal to focus on Right shoulder, but may need future work-up for neck pain  Nobie Putnam, [place], PGY-2  Patient was seen and evaluated with the resident. I agree with plan of care.

## 2014-09-02 ENCOUNTER — Ambulatory Visit
Admission: RE | Admit: 2014-09-02 | Discharge: 2014-09-02 | Disposition: A | Discharge status: Home / Self Care | Payer: Medicare Other | Source: Ambulatory Visit | Attending: Sports Medicine | Admitting: Sports Medicine

## 2014-09-02 DIAGNOSIS — M19011 Primary osteoarthritis, right shoulder: Secondary | ICD-10-CM | POA: Diagnosis not present

## 2014-09-02 DIAGNOSIS — M7591 Shoulder lesion, unspecified, right shoulder: Secondary | ICD-10-CM | POA: Diagnosis not present

## 2014-09-02 DIAGNOSIS — M25511 Pain in right shoulder: Secondary | ICD-10-CM

## 2014-09-06 ENCOUNTER — Telehealth: Payer: Self-pay | Admitting: Sports Medicine

## 2014-09-06 NOTE — Telephone Encounter (Signed)
I spoke with the patient on the phone after reviewing the MRI of her right shoulder. No rotator cuff tear. No labral tear. Dominant finding is age advanced glenohumeral degenerative changes which were not seen on her plain x-rays. She has a moderate size joint effusion. No intra-articular loose bodies. Based on these findings I have recommended that the patient return to the office for an ultrasound-guided glenohumeral injection. The patient informs me that she received a letter from [facility] explaining to her that future injections may not be covered. I've asked her to drop off a copy of this letter for my perusal before we schedule her follow-up appointment. She will drop off this letter sometime later this week.

## 2014-10-11 ENCOUNTER — Ambulatory Visit: Payer: Medicare Other | Admitting: Sports Medicine

## 2014-10-18 ENCOUNTER — Ambulatory Visit (INDEPENDENT_AMBULATORY_CARE_PROVIDER_SITE_OTHER): Payer: Medicare Other | Admitting: Sports Medicine

## 2014-10-18 ENCOUNTER — Other Ambulatory Visit: Payer: Self-pay | Admitting: Sports Medicine

## 2014-10-18 ENCOUNTER — Encounter: Payer: Self-pay | Admitting: Sports Medicine

## 2014-10-18 VITALS — BP 143/81 | Ht 64.0 in | Wt 278.0 lb

## 2014-10-18 DIAGNOSIS — M67911 Unspecified disorder of synovium and tendon, right shoulder: Secondary | ICD-10-CM

## 2014-10-18 DIAGNOSIS — M129 Arthropathy, unspecified: Secondary | ICD-10-CM | POA: Diagnosis not present

## 2014-10-18 DIAGNOSIS — M19019 Primary osteoarthritis, unspecified shoulder: Secondary | ICD-10-CM

## 2014-10-18 NOTE — Progress Notes (Signed)
Patient ID: Kendra [name], female   DOB: 1966-06-26, 49 y.o.   MRN: [id number]  Patient comes in today for follow-up on right shoulder pain. A recent MRI of her shoulder shows no rotator cuff tear and no labral tear. Dominant finding is age advanced glenohumeral degenerative changes which were not seen on her plain x-rays. She has a moderate-sized joint effusion but no intra-articular loose bodies. I've recommended a glenohumeral injection. Given her concern about a letter that she apparently received from [facility] questioning further injections here in our office I will go ahead and refer her to [facility] imaging for the procedure. If symptoms persist thereafter I would consider referral to Dr. Mardelle Matte to discuss other treatment options. Otherwise, follow-up when necessary.

## 2014-10-19 ENCOUNTER — Ambulatory Visit
Admission: RE | Admit: 2014-10-19 | Discharge: 2014-10-19 | Disposition: A | Discharge status: Home / Self Care | Payer: PRIVATE HEALTH INSURANCE | Source: Ambulatory Visit | Attending: Sports Medicine | Admitting: Sports Medicine

## 2014-10-19 DIAGNOSIS — M67911 Unspecified disorder of synovium and tendon, right shoulder: Secondary | ICD-10-CM

## 2014-10-19 DIAGNOSIS — M25511 Pain in right shoulder: Secondary | ICD-10-CM | POA: Diagnosis not present

## 2014-10-19 MED ORDER — IOHEXOL 180 MG/ML  SOLN
1.0000 mL | Freq: Once | INTRAMUSCULAR | Status: AC | PRN
  Administered 2014-10-19: 1 mL via INTRA_ARTICULAR

## 2014-10-19 MED ORDER — METHYLPREDNISOLONE ACETATE 40 MG/ML INJ SUSP (RADIOLOG
120.0000 mg | Freq: Once | INTRAMUSCULAR | Status: AC
  Administered 2014-10-19: 120 mg via INTRA_ARTICULAR

## 2014-10-28 ENCOUNTER — Other Ambulatory Visit: Payer: Self-pay | Admitting: *Deleted

## 2014-10-28 MED ORDER — TRAMADOL HCL 50 MG PO TABS: 50.0000 mg | ORAL_TABLET | Freq: Four times a day (QID) | ORAL | Status: DC | PRN

## 2014-12-17 ENCOUNTER — Other Ambulatory Visit: Payer: Self-pay

## 2014-12-17 DIAGNOSIS — Z1231 Encounter for screening mammogram for malignant neoplasm of breast: Secondary | ICD-10-CM

## 2014-12-24 ENCOUNTER — Ambulatory Visit: Payer: PRIVATE HEALTH INSURANCE

## 2015-01-07 ENCOUNTER — Other Ambulatory Visit: Payer: Self-pay | Admitting: *Deleted

## 2015-01-07 MED ORDER — TRAMADOL HCL 50 MG PO TABS: 50.0000 mg | ORAL_TABLET | Freq: Four times a day (QID) | ORAL | Status: DC | PRN

## 2015-01-10 ENCOUNTER — Telehealth: Payer: Self-pay | Admitting: Family Medicine

## 2015-01-10 ENCOUNTER — Other Ambulatory Visit: Payer: Self-pay | Admitting: *Deleted

## 2015-01-10 MED ORDER — GABAPENTIN 100 MG PO CAPS: 100.0000 mg | ORAL_CAPSULE | Freq: Three times a day (TID) | ORAL | Status: DC

## 2015-01-10 NOTE — Telephone Encounter (Signed)
Kendra [name] is calling because she has been on her menstrual cycle since April 22nd and she would like to know what she can do and whether or not she should be worried. She would like to receive a phone call from a nurse to discuss. Thanks, [employer], ASA

## 2015-01-10 NOTE — Telephone Encounter (Signed)
Thank you Tamika for addressing this issue and getting her in so quickly.  Loa Socks

## 2015-01-10 NOTE — Telephone Encounter (Signed)
Spoke with the patient regarding menstrual cycle since 12/17/14.  Pt stated she has been bleeding nonstop since April with clots.  Pt denies any fever, n/v and some abdominal cramping.  Pt advised she should be seen; appt in the AM with Dr. Venetia Maxon. Will forward to PCP for further advise.  Derl Barrow, RN

## 2015-01-11 ENCOUNTER — Encounter: Payer: Self-pay | Admitting: Family Medicine

## 2015-01-11 ENCOUNTER — Other Ambulatory Visit (HOSPITAL_COMMUNITY)
Admission: RE | Admit: 2015-01-11 | Discharge: 2015-01-11 | Disposition: A | Discharge status: Home / Self Care | Payer: Medicare Other | Source: Ambulatory Visit | Attending: Family Medicine | Admitting: Family Medicine

## 2015-01-11 ENCOUNTER — Ambulatory Visit (INDEPENDENT_AMBULATORY_CARE_PROVIDER_SITE_OTHER): Payer: Medicare Other | Admitting: Family Medicine

## 2015-01-11 VITALS — BP 154/96 | HR 84 | Ht 64.0 in | Wt 276.0 lb

## 2015-01-11 DIAGNOSIS — N938 Other specified abnormal uterine and vaginal bleeding: Secondary | ICD-10-CM

## 2015-01-11 DIAGNOSIS — Z124 Encounter for screening for malignant neoplasm of cervix: Secondary | ICD-10-CM | POA: Insufficient documentation

## 2015-01-11 DIAGNOSIS — Z1151 Encounter for screening for human papillomavirus (HPV): Secondary | ICD-10-CM | POA: Insufficient documentation

## 2015-01-11 LAB — CBC
HEMATOCRIT: 26.3 % — AB (ref 36.0–46.0)
HEMOGLOBIN: 7.6 g/dL — AB (ref 12.0–15.0)
MCH: 20.5 pg — AB (ref 26.0–34.0)
MCHC: 28.9 g/dL — ABNORMAL LOW (ref 30.0–36.0)
MCV: 70.9 fL — AB (ref 78.0–100.0)
MPV: 8.8 fL (ref 8.6–12.4)
Platelets: 470 10*3/uL — ABNORMAL HIGH (ref 150–400)
RBC: 3.71 MIL/uL — AB (ref 3.87–5.11)
RDW: 18.2 % — ABNORMAL HIGH (ref 11.5–15.5)
WBC: 7.5 10*3/uL (ref 4.0–10.5)

## 2015-01-11 LAB — POCT URINE PREGNANCY: Preg Test, Ur: NEGATIVE

## 2015-01-11 NOTE — Patient Instructions (Signed)
Thank you for coming in, today!  I'm not 100% sure what is causing your bleeding. My suspicion is highest for fibroids, benign tumors in the uterus that can cause heavy bleeding. I will check your blood today to make sure your hemoglobin isn't dropping too low. I will call you or write you a letter with that result.  I also want to get an ultrasound so we can look at your uterus and ovaries. This can help tell us if there are fibroids there or not, or if we need to do different testing. We will get this scheduled and tell you where to go to get it done.  In the meantime, I want you to take Aleve, two pills twice per day, for the next 3-5 days. This should help with the cramping and might help some with the bleeding. Depending on what your testing looks like, we might end up doing different tests, trying different medicines, or sending you to the [facility] doctors.  I will keep Dr. Alease Frame (your regular doctor) informed so he knows what all we are looking for. You should come back to see him after you get the ultrasound done. Please feel free to call with any questions or concerns at any time, at [phone]. --Dr. Venetia Maxon

## 2015-01-11 NOTE — Progress Notes (Signed)
   Subjective:    Patient ID: Kendra [name], female    DOB: [birth year], 49 y.o.   MRN: [id number]  HPI: Pt presents to clinic for SDA for persistent vaginal bleeding since April 22nd; her normal period started that day and she has not stopped having bleeding. She reports bleeding with bright blood and clots, and she reports using 7-10 per day. She has some right-sided pain and cramping; she takes muscle relaxers for pain, which helps some. She takes tramadol but does not take ibuprofen, Aleve, or other NSAIDs. She has not felt especially lightheaded, dizzy, or pre-syncopal. She has some chronic fatigue that is no different from normal.  She typically has 4-5 day periods that are normally heavy but only for those days. She has never had prolonged periods in the past. She did have some short periods in January and February and did not have one in March. She has not been sexually active for 2-3 years. She took birth control pills many years ago.  She has no family history of cancer (uterine, breast, or ovarian). She did have an aunt who required a hysterectomy because of bleeding.  Review of Systems: As above. She does report some chills at night and had some vomiting last week, which stopped. She has no vaginal itching / burning / discharge.     Objective:   Physical Exam BP 154/96 mmHg  Pulse 84  Ht 5\' 4"  (1.626 m)  Wt 276 lb (125.193 kg)  BMI 47.35 kg/m2  LMP 12/16/2013 (Exact Date) Gen: well-appearing adult female in NAD HEENT: [place]/AT, EOMI, PERRLA, MMM  No obvious conjunctival pallor Cardio: RRR, no murmur appreciated Pulm: CTAB, no wheezes, normal WOB Abd: soft, nontender, BS+ Ext: warm, well-perfused, no LE edema GU: normal external vulvar structures Speculum: Normal-appearing vaginal mucosae with frank oozing blood from cervix  Cervix itself appears normal without frank friability Bimanual: uterine fundus firm, slightly enlarged, mildly tender  No frank adnexal masses noted  No  frank CMT, but exam overall uncomfortable causing menstrual-type cramping  Urine pregnancy test: NEGATIVE     Assessment & Plan:  49yo female with dysfunctional uterine bleeding and irregular periods for ~2-3 months - broad DDx, with primary concern currently for fibroids +/- early menopausal-type changes but need to rule out other masses / malignancy - no obvious signs / symptoms of frank anemia or infection - urine pregnancy test negative and pt reports no sexual activity for 2+ years so strongly doubt ectopic pregnancy or sexually-related issues  Plan: - check CBC today to eval for degree of anemia, and consider restart iron supplement (has used this in the past, liquid form) - Pap smear performed today (pt past due, last was in 2012) - ordered for pelvic and transvaginal US to eval for uterine masses, fibroids, etc - advised use of Aleve 440 mg BID for 3-5 days to see if this helps any with cramping / bleeding - strongly advised close f/u with PCP Dr. Alease Frame or presentation to the ED if any symptoms worse or if new symptoms develop - will f/u on [place] and lab results as appropriate and help arrange f/u or referral to OBGYN, as appropriate  Note FYI to Dr. Alease Frame.  Emmaline Kluver, MD PGY-3, [facility] 01/11/2015, 12:04 PM

## 2015-01-12 ENCOUNTER — Telehealth: Payer: Self-pay | Admitting: Family Medicine

## 2015-01-12 DIAGNOSIS — D509 Iron deficiency anemia, unspecified: Secondary | ICD-10-CM

## 2015-01-12 LAB — CYTOLOGY - PAP

## 2015-01-12 MED ORDER — FERROUS SULFATE 220 (44 FE) MG/5ML PO ELIX: 220.0000 mg | ORAL_SOLUTION | Freq: Three times a day (TID) | ORAL | Status: AC

## 2015-01-12 NOTE — Telephone Encounter (Signed)
Thank you Chris.

## 2015-01-12 NOTE — Telephone Encounter (Signed)
Called pt to discuss lab result. Hb 7.6, about the same as it was last year. Pt reports no new symptoms. Pelvic / transvaginal US planned for tomorrow.  Plan: - refilled PO iron solution and advised daily use, for now - will f/u on [place] result as appropriate - other f/u with PCP Dr. Alease Frame will depend on [place] result  Pt voiced understanding. Note FYI to Dr. Alease Frame.  Emmaline Kluver, MD PGY-3, [facility] 01/12/2015, 9:03 AM

## 2015-01-13 ENCOUNTER — Ambulatory Visit
Admission: RE | Admit: 2015-01-13 | Discharge: 2015-01-13 | Disposition: A | Discharge status: Home / Self Care | Payer: Medicare Other | Source: Ambulatory Visit | Attending: Family Medicine | Admitting: Family Medicine

## 2015-01-13 DIAGNOSIS — D259 Leiomyoma of uterus, unspecified: Secondary | ICD-10-CM | POA: Diagnosis not present

## 2015-01-13 DIAGNOSIS — R938 Abnormal findings on diagnostic imaging of other specified body structures: Secondary | ICD-10-CM | POA: Diagnosis not present

## 2015-01-13 DIAGNOSIS — N938 Other specified abnormal uterine and vaginal bleeding: Secondary | ICD-10-CM

## 2015-01-14 ENCOUNTER — Telehealth: Payer: Self-pay | Admitting: Family Medicine

## 2015-01-14 DIAGNOSIS — R9389 Abnormal findings on diagnostic imaging of other specified body structures: Secondary | ICD-10-CM | POA: Insufficient documentation

## 2015-01-14 DIAGNOSIS — D259 Leiomyoma of uterus, unspecified: Secondary | ICD-10-CM | POA: Insufficient documentation

## 2015-01-14 DIAGNOSIS — N938 Other specified abnormal uterine and vaginal bleeding: Secondary | ICD-10-CM | POA: Insufficient documentation

## 2015-01-14 NOTE — Telephone Encounter (Signed)
Called pt to discuss [place] results. She reports her bleeding is about the same but has not been any worse (see recent phone and office notes). Multiple uterine fibroids noted along with endometrial stripe thickening to 17 mm (however, pt is still menstruating, so this may not require further work-up, as hysterectomy may be indicated given dysfunctional uterine bleeding, anyway).  Discussed options with pt, including endometrial biopsy here and then referral to OBGYN or referral to OBGYN first for eval to see if she needs further work-up or surgery. She prefers to speak with specialist, first, before deciding on further work-up. Referral to OBGYN placed today. Pt to f/u with PCP Dr. Alease Frame as needed, otherwise. Pt voiced understanding.  Note FYI to Dr. Alease Frame.  Kendra Kluver, MD PGY-3, [facility] [facility] 01/14/2015, 10:51 AM

## 2015-01-19 ENCOUNTER — Encounter: Payer: Self-pay | Admitting: Obstetrics and Gynecology

## 2015-01-21 ENCOUNTER — Telehealth: Payer: Self-pay | Admitting: Family Medicine

## 2015-01-21 NOTE — Telephone Encounter (Signed)
Patient informed of MD she will be seeing and given number to [facility].

## 2015-01-21 NOTE — Telephone Encounter (Signed)
Patient informed of appointment on 6/22 at 2:45 at [facility].

## 2015-01-21 NOTE — Telephone Encounter (Signed)
Needs [facility] and [facility]

## 2015-01-21 NOTE — Telephone Encounter (Signed)
Thank you ladies. 

## 2015-01-21 NOTE — Telephone Encounter (Signed)
[facility] to look at the results and see if she is suppose to be having surger. Please advise if the referral has been done

## 2015-02-16 ENCOUNTER — Encounter: Payer: Self-pay | Admitting: Obstetrics and Gynecology

## 2015-02-16 ENCOUNTER — Other Ambulatory Visit (HOSPITAL_COMMUNITY)
Admission: RE | Admit: 2015-02-16 | Discharge: 2015-02-16 | Disposition: A | Discharge status: Home / Self Care | Payer: Medicare Other | Source: Ambulatory Visit | Attending: Obstetrics and Gynecology | Admitting: Obstetrics and Gynecology

## 2015-02-16 ENCOUNTER — Ambulatory Visit (INDEPENDENT_AMBULATORY_CARE_PROVIDER_SITE_OTHER): Payer: Medicare Other | Admitting: Obstetrics and Gynecology

## 2015-02-16 VITALS — BP 116/98 | HR 101 | Temp 98.1°F | Wt 268.1 lb

## 2015-02-16 DIAGNOSIS — N938 Other specified abnormal uterine and vaginal bleeding: Secondary | ICD-10-CM

## 2015-02-16 DIAGNOSIS — Z01818 Encounter for other preprocedural examination: Secondary | ICD-10-CM | POA: Diagnosis not present

## 2015-02-16 DIAGNOSIS — R87619 Unspecified abnormal cytological findings in specimens from cervix uteri: Secondary | ICD-10-CM | POA: Diagnosis not present

## 2015-02-16 LAB — POCT PREGNANCY, URINE: Preg Test, Ur: NEGATIVE

## 2015-02-16 NOTE — Progress Notes (Signed)
Subjective:    Patient ID: Kendra [name], female    DOB: 1965-11-13, 49 y.o.   MRN: [id number]  HPI 49 yo G3P3 presenting today for the evaluation of DUB. Patient reports regular monthly cycles of 4-5 days up until April when she nearly bled for 5 weeks. She reports the bleeding as heavy with passage of clots and associated with some cramping pain. She bled heavy again in May but not in June. Patient is otherwise without complaints  Past Medical History  Diagnosis Date  . Anemia    Past Surgical History  Procedure Laterality Date  . Cesarean section    . Tubal ligation    . Tibia fracture surgery    . Ankle surgery Left   . Finger surgery     Family History  Problem Relation Age of Onset  . Diabetes Mother   . Heart disease Mother   . Cancer Father 23    lung   History  Substance Use Topics  . Smoking status: Current Every Day Smoker -- 0.25 packs/day    Types: Cigarettes    Start date: 08/27/2002  . Smokeless tobacco: Never Used     Comment: cut back to 4-5 cigs per day from 1ppd  . Alcohol Use: 0.0 oz/week     Comment: 2 -40oz daily      Review of Systems See pertinent in HPI    Objective:   Physical Exam Blood pressure 116/98, pulse 101, temperature 98.1 F (36.7 C), weight 268 lb 1.6 oz (121.609 kg), last menstrual period 02/07/2015.   GENERAL: Well-developed, well-nourished female in no acute distress.  HEENT: Normocephalic, atraumatic. Sclerae anicteric.  NECK: Supple. Normal thyroid.  LUNGS: Clear to auscultation bilaterally.  HEART: Regular rate and rhythm. BREASTS: Symmetric in size. No palpable masses or lymphadenopathy, skin changes, or nipple drainage. ABDOMEN: Soft, nontender, nondistended. No organomegaly. PELVIC: Normal external female genitalia. Vagina is pink and rugated.  Normal discharge. Normal appearing cervix. Uterus is normal in size.  No adnexal mass or tenderness. EXTREMITIES: No cyanosis, clubbing, or edema, 2+ distal  pulses.  12/2014 Ultrasound FINDINGS: Uterus  Measurements: 16.0 x 8.6 x 9.9 cm. Multiple uterine fibroids are noted. The largest anterior fibroid measures 5.2 cm in maximum diameter, the largest posterior fibroid measures 4.7 cm in maximum diameter.  Endometrium  Thickness: 17 mm. No focal abnormality visualized.  Right ovary  Measurements: 3.7 x 2.1 x 2.4 cm. Normal appearance/no adnexal mass.  Left ovary  Measurements: 3.2 x 1.5 x 1.6 cm. Normal appearance/no adnexal mass.  Other findings  No free fluid.  IMPRESSION: 1. Fibroid uterus with multiple large fibroids. 2. Endometrial thickening to 17 mm. If bleeding remains unresponsive to hormonal or medical therapy, focal lesion work-up with sonohysterogram should be considered. Endometrial biopsy should also be considered in pre-menopausal patients at high risk for endometrial carcinoma. (Ref: Radiological Reasoning: Algorithmic Workup of Abnormal Vaginal Bleeding with Endovaginal Sonography and Sonohysterography. AJR 2008; 601:U93-23)      Assessment & Plan:  49 yo with DUB and fibroid uterus - Discussed the need for endometrial biopsy ENDOMETRIAL BIOPSY     The indications for endometrial biopsy were reviewed.   Risks of the biopsy including cramping, bleeding, infection, uterine perforation, inadequate specimen and need for additional procedures  were discussed. The patient states she understands and agrees to undergo procedure today. Consent was signed. Time out was performed. Urine HCG was negative. A sterile speculum was placed in the patient's vagina and the cervix was  prepped with Betadine. A single-toothed tenaculum was placed on the anterior lip of the cervix to stabilize it. The uterine cavity was sounded to a depth of 14 cm using the uterine sound. The 3 mm pipelle was introduced into the endometrial cavity without difficulty, 2 passes were made.  A  moderate amount of tissue was  sent to pathology.  The instruments were removed from the patient's vagina. Minimal bleeding from the cervix was noted. The patient tolerated the procedure well.  Routine post-procedure instructions were given to the patient. The patient will follow up in two weeks to review the results and for further management.   Patient will be contacted with results and management plan. She is interested in robotic hysterectomy

## 2015-02-21 ENCOUNTER — Other Ambulatory Visit: Payer: Self-pay | Admitting: Obstetrics and Gynecology

## 2015-02-21 ENCOUNTER — Telehealth: Payer: Self-pay | Admitting: *Deleted

## 2015-02-21 NOTE — Telephone Encounter (Signed)
Called patient to inform her of negative endometrial biopsy. Heard busy tone will try again later.

## 2015-02-21 NOTE — Telephone Encounter (Signed)
Pt informed of results and plan

## 2015-02-21 NOTE — Telephone Encounter (Signed)
Per Dr. Elly Modena patient will be scheduled for robotic assisted hysterectomy-- she will beet with Dr. Ihor Dow prior to.

## 2015-02-22 ENCOUNTER — Encounter (HOSPITAL_COMMUNITY): Payer: Self-pay | Admitting: *Deleted

## 2015-02-22 ENCOUNTER — Other Ambulatory Visit: Payer: Self-pay | Admitting: Family Medicine

## 2015-02-22 NOTE — Telephone Encounter (Signed)
Needs refill on her "stomach pills" Pt did not know what they were [facility] on [address]

## 2015-02-23 MED ORDER — OMEPRAZOLE 20 MG PO CPDR: 20.0000 mg | DELAYED_RELEASE_CAPSULE | Freq: Every day | ORAL | Status: DC

## 2015-03-03 ENCOUNTER — Encounter: Payer: Self-pay | Admitting: Obstetrics & Gynecology

## 2015-04-01 ENCOUNTER — Ambulatory Visit (INDEPENDENT_AMBULATORY_CARE_PROVIDER_SITE_OTHER): Payer: Medicare Other | Admitting: Obstetrics & Gynecology

## 2015-04-01 ENCOUNTER — Encounter (HOSPITAL_COMMUNITY)
Admission: RE | Admit: 2015-04-01 | Discharge: 2015-04-01 | Disposition: A | Discharge status: Home / Self Care | Payer: Medicare Other | Source: Ambulatory Visit | Attending: Obstetrics & Gynecology | Admitting: Obstetrics & Gynecology

## 2015-04-01 ENCOUNTER — Other Ambulatory Visit: Payer: Self-pay

## 2015-04-01 ENCOUNTER — Telehealth: Payer: Self-pay | Admitting: *Deleted

## 2015-04-01 VITALS — BP 142/65 | HR 69 | Ht 64.0 in | Wt 276.6 lb

## 2015-04-01 DIAGNOSIS — D259 Leiomyoma of uterus, unspecified: Secondary | ICD-10-CM | POA: Insufficient documentation

## 2015-04-01 DIAGNOSIS — G8929 Other chronic pain: Secondary | ICD-10-CM

## 2015-04-01 DIAGNOSIS — N949 Unspecified condition associated with female genital organs and menstrual cycle: Secondary | ICD-10-CM

## 2015-04-01 DIAGNOSIS — Z01818 Encounter for other preprocedural examination: Secondary | ICD-10-CM | POA: Insufficient documentation

## 2015-04-01 DIAGNOSIS — R102 Pelvic and perineal pain: Secondary | ICD-10-CM

## 2015-04-01 DIAGNOSIS — N938 Other specified abnormal uterine and vaginal bleeding: Secondary | ICD-10-CM | POA: Diagnosis not present

## 2015-04-01 LAB — CBC
HCT: 29.6 % — ABNORMAL LOW (ref 36.0–46.0)
HEMOGLOBIN: 8.5 g/dL — AB (ref 12.0–15.0)
MCH: 22.1 pg — ABNORMAL LOW (ref 26.0–34.0)
MCHC: 28.7 g/dL — AB (ref 30.0–36.0)
MCV: 77.1 fL — AB (ref 78.0–100.0)
Platelets: 315 10*3/uL (ref 150–400)
RBC: 3.84 MIL/uL — ABNORMAL LOW (ref 3.87–5.11)
RDW: 18.6 % — AB (ref 11.5–15.5)
WBC: 7.2 10*3/uL (ref 4.0–10.5)

## 2015-04-01 LAB — ABO/RH: ABO/RH(D): O POS

## 2015-04-01 MED ORDER — CEFAZOLIN SODIUM-DEXTROSE 2-3 GM-% IV SOLR: 2.0000 g | INTRAVENOUS | Status: DC

## 2015-04-01 MED ORDER — DEXTROSE 5 % IV SOLN
3.0000 g | INTRAVENOUS | Status: DC
  Filled 2015-04-01: qty 3000

## 2015-04-01 NOTE — Patient Instructions (Signed)
Total Laparoscopic Hysterectomy, Care After Refer to this sheet in the next few weeks. These instructions provide you with information on caring for yourself after your procedure. Your health care provider may also give you more specific instructions. Your treatment has been planned according to current medical practices, but problems sometimes occur. Call your health care provider if you have any problems or questions after your procedure. WHAT TO EXPECT AFTER THE PROCEDURE  Pain and bruising at the incision sites. You will be given pain medicine to control it.  Menopausal symptoms such as hot flashes, night sweats, and insomnia if your ovaries were removed.  Sore throat from the breathing tube that was inserted during surgery. HOME CARE INSTRUCTIONS  Only take over-the-counter or prescription medicines for pain, discomfort, or fever as directed by your health care provider.   Do not take aspirin. It can cause bleeding.   Do not drive when taking pain medicine.   Follow your health care provider's advice regarding diet, exercise, lifting, driving, and general activities.   Resume your usual diet as directed and allowed.   Get plenty of rest and sleep.   Do not douche, use tampons, or have sexual intercourse for at least 6 weeks, or until your health care provider gives you permission.   Change your bandages (dressings) as directed by your health care provider.   Monitor your temperature and notify your health care provider of a fever.   Take showers instead of baths for 2-3 weeks.   Do not drink alcohol until your health care provider gives you permission.   If you develop constipation, you may take a mild laxative with your health care provider's permission. Bran foods may help with constipation problems. Drinking enough fluids to keep your urine clear or pale yellow may help as well.   Try to have someone home with you for 1-2 weeks to help around the house.    Keep all of your follow-up appointments as directed by your health care provider.  SEEK MEDICAL CARE IF:  You have swelling, redness, or increasing pain around your incision sites.   You have pus coming from your incision.   You notice a bad smell coming from your incision.   Your incision breaks open.   You feel dizzy or lightheaded.   You have pain or bleeding when you urinate.   You have persistent diarrhea.   You have persistent nausea and vomiting.   You have abnormal vaginal discharge.   You have a rash.   You have any type of abnormal reaction or develop an allergy to your medicine.   You have poor pain control with your prescribed medicine.  SEEK IMMEDIATE MEDICAL CARE IF:  You have chest pain or shortness of breath.  You have severe abdominal pain that is not relieved with pain medicine.  You have pain or swelling in your legs. MAKE SURE YOU:  Understand these instructions.  Will watch your condition.  Will get help right away if you are not doing well or get worse. Document Released: 06/03/2013 Document Revised: 08/18/2013 Document Reviewed: 06/03/2013 [facility] Patient Information 2015 [place], [place]. This information is not intended to replace advice given to you by your health care provider. Make sure you discuss any questions you have with your health care provider. Total Laparoscopic Hysterectomy A total laparoscopic hysterectomy is a minimally invasive surgery to remove your uterus and cervix. This surgery is performed by making several small cuts (incisions) in your abdomen. It can also be done  with a thin, lighted tube (laparoscope) inserted into two small incisions in your lower abdomen. Your fallopian tubes and ovaries can be removed (bilateral salpingo-oophorectomy) during this surgery as well.Benefits of minimally invasive surgery include:  Less pain.  Less risk of blood loss.  Less risk of infection.  Quicker return to  normal activities. LET [facility] CARE PROVIDER KNOW ABOUT:  Any allergies you have.  All medicines you are taking, including vitamins, herbs, eye drops, creams, and over-the-counter medicines.  Previous problems you or members of your family have had with the use of anesthetics.  Any blood disorders you have.  Previous surgeries you have had.  Medical conditions you have. RISKS AND COMPLICATIONS  Generally, this is a safe procedure. However, as with any procedure, complications can occur. Possible complications include:  Bleeding.  Blood clots in the legs or lung.  Infection.  Injury to surrounding organs.  Problems with anesthesia.  Early menopause symptoms (hot flashes, night sweats, insomnia).  Risk of conversion to an open abdominal incision. BEFORE THE PROCEDURE  Ask your health care provider about changing or stopping your regular medicines.  Do not take aspirin or blood thinners (anticoagulants) for 1 week before the surgery or as told by your health care provider.  Do not eat or drink anything for 8 hours before the surgery or as told by your health care provider.  Quit smoking if you smoke.  Arrange for a ride home after surgery and for someone to help you at home during recovery. PROCEDURE   You will be given antibiotic medicine.  An IV tube will be placed in your arm. You will be given medicine to make you sleep (general anesthetic).  A gas (carbon dioxide) will be used to inflate your abdomen. This will allow your surgeon to look inside your abdomen, perform your surgery, and treat any other problems found if necessary.  Three or four small incisions (often less than 1/2 inch) will be made in your abdomen. One of these incisions will be made in the area of your belly button (navel). The laparoscope will be inserted into the incision. Your surgeon will look through the laparoscope while doing your procedure.  Other surgical instruments will be inserted  through the other incisions.  Your uterus may be removed through your vagina or cut into small pieces and removed through the small incisions.  Your incisions will be closed. AFTER THE PROCEDURE  The gas will be released from inside your abdomen.  You will be taken to the recovery area where a nurse will watch and check your progress. Once you are awake, stable, and taking fluids well, without other problems, you will return to your room or be allowed to go home.  There is usually minimal discomfort following the surgery because the incisions are so small.  You will be given pain medicine while you are in the hospital and for when you go home. Document Released: 06/10/2007 Document Revised: 04/15/2013 Document Reviewed: 03/03/2013 [facility] Patient Information 2015 [place], [place]. This information is not intended to replace advice given to you by your health care provider. Make sure you discuss any questions you have with your health care provider.

## 2015-04-01 NOTE — Telephone Encounter (Signed)
Pt is anemic and it is recommended that she iron (ferrous sulfate).    Contacted patient, patient is taking liquid iron  Three times per day.  Pt states she vomits when taking the medication. Encouraged patient to take small doses several times per day.  Pt will call back for an appointment if this does not resolve the issue.

## 2015-04-01 NOTE — Patient Instructions (Addendum)
   Your procedure is scheduled on: AUGUST 16 AT 1PM  Enter through the Main Entrance of [facility] at: [place] up the phone at the desk and dial [phone] and inform us of your arrival.  Please call this number if you have any problems the morning of surgery: [phone]  Remember: Do not eat food after midnight: AUG 15 (MONDAY) Do not drink clear liquids after: 9AM AUG 16 DAY OF SURGERY Take these medicines the morning of surgery with a SIP OF WATER: BRING  INHALER DAY OF SURGERY  Do not wear jewelry, make-up, or FINGER nail polish No metal in your hair or on your body. Do not wear lotions, powders, perfumes.  You may wear deodorant.  Do not bring valuables to the hospital. Contacts, dentures or bridgework may not be worn into surgery.   Patients discharged on the day of surgery will not be allowed to drive home.

## 2015-04-01 NOTE — Progress Notes (Signed)
Patient ID: Kendra [name], female   DOB: 08-06-1966, 49 y.o.   MRN: [id number] Preoperative History and Physical  Kendra [name] is a 49 y.o. [phone] here for surgical management of pelvic pain.  Pt reports that she has not had menses since May.  She reports that she has pain that feels like menstrual cramps and has had pain with menses since menarche.  Now the pain occurs weekly and is 10/10 when it occurs.  She takes muscles relaxants and Tramadol for the pain.   Proposed surgery: Robot Assisted Total Laparoscopic hysterectomy with bilateral salpingectomy.   Past Medical History  Diagnosis Date  . Anemia    Past Surgical History  Procedure Laterality Date  . Cesarean section    . Tubal ligation    . Tibia fracture surgery    . Ankle surgery Left   . Finger surgery     OB History    Gravida Para Term Preterm AB TAB SAB Ectopic Multiple Living   3 3 3  0 0 0 0 0 0 3     Patient denies any cervical dysplasia or STIs.  (Not in a hospital admission)  No Known Allergies Social History:   reports that she has been smoking Cigarettes.  She started smoking about 12 years ago. She has been smoking about 0.25 packs per day. She has never used smokeless tobacco. She reports that she drinks alcohol. She reports that she does not use illicit drugs. Family History  Problem Relation Age of Onset  . Diabetes Mother   . Heart disease Mother   . Cancer Father 20    lung    Review of Systems: Noncontributory  PHYSICAL EXAM: Blood pressure 142/65, pulse 69, height 5\' 4"  (1.626 m), weight 276 lb 9.6 oz (125.465 kg). General appearance - alert, well appearing, and in no distress; Pt morbidly obese Chest - clear to auscultation, no wheezes, rales or rhonchi, symmetric air entry Heart - normal rate and regular rhythm Abdomen - soft, nontender, nondistended, no masses or organomegaly Pelvic - uterus 16 weeks sized. Mobile; adnexa: difficult to appreciate due to body habitus  Extremities -  peripheral pulses normal, no pedal edema, no clubbing or cyanosis   Imaging Studies: 01/13/2015 CLINICAL DATA: Dysfunctional uterine bleeding.  EXAM: TRANSABDOMINAL AND TRANSVAGINAL ULTRASOUND OF PELVIS  TECHNIQUE: Both transabdominal and transvaginal ultrasound examinations of the pelvis were performed. Transabdominal technique was performed for global imaging of the pelvis including uterus, ovaries, adnexal regions, and pelvic cul-de-sac. It was necessary to proceed with endovaginal exam following the transabdominal exam to visualize the uterus and ovaries.  COMPARISON: None  FINDINGS: Uterus  Measurements: 16.0 x 8.6 x 9.9 cm. Multiple uterine fibroids are noted. The largest anterior fibroid measures 5.2 cm in maximum diameter, the largest posterior fibroid measures 4.7 cm in maximum diameter.  Endometrium  Thickness: 17 mm. No focal abnormality visualized.  Right ovary  Measurements: 3.7 x 2.1 x 2.4 cm. Normal appearance/no adnexal mass.  Left ovary  Measurements: 3.2 x 1.5 x 1.6 cm. Normal appearance/no adnexal mass.  Other findings  No free fluid.  IMPRESSION: 1. Fibroid uterus with multiple large fibroids. 2. Endometrial thickening to 17 mm. If bleeding remains unresponsive to hormonal or medical therapy, focal lesion work-up with sonohysterogram should be considered. Endometrial biopsy should also be considered in pre-menopausal patients at high risk for endometrial carcinoma. (Ref: Radiological Reasoning: Algorithmic Workup of Abnormal Vaginal Bleeding with Endovaginal Sonography and Sonohysterography. AJR 2008; [id number]:[id number]) Assessment: Patient Active Problem List  Diagnosis Date Noted  . Uterine fibroid 01/14/2015  . Endometrial thickening on ultra sound 01/14/2015  . Dysfunctional uterine bleeding 01/14/2015  . Tendinopathy of right rotator cuff 08/24/2014  . Hyperglycemia 06/01/2013  . Weight gain 06/01/2013  . Cervical neck  pain with evidence of disc disease 05/14/2012  . Osteoarthrosis, unspecified whether generalized or localized, involving lower leg 05/14/2012  . Abdominal pain 03/14/2012  . Numbness and tingling of right arm most likely related to cervical radiculopathy 11/06/2011  . Chronic low back pain 12/20/2010  . Morbid obesity 04/07/2008  . ALCOHOL ABUSE 04/07/2008  . TOBACCO ABUSE 04/07/2008  . ANEMIA, IRON DEFICIENCY, UNSPEC. 10/24/2006  . GASTROESOPHAGEAL REFLUX, NO ESOPHAGITIS 10/24/2006    Plan: Patient will undergo surgical management with Robot Assisted Total Laparoscopic hysterectomy with bilateral salpingectomy with removal of the ovaries if indicated.   The risks of surgery were discussed in detail with the patient including but not limited to: bleeding which may require transfusion or reoperation; infection which may require prolonged hospitalization or re-hospitalization and antibiotic therapy; injury to bowel, bladder, ureters and major vessels or other surrounding organs; need for additional procedures including laparotomy; thromboembolic phenomenon, incisional problems and other postoperative or anesthesia complications.  Patient was told that the likelihood that her condition and symptoms will be treated effectively with this surgical management was very high; the postoperative expectations were also discussed in detail. The patient also understands the alternative treatment options which were discussed in full. All questions were answered.     Kendra [name], M.D., [facility] Kendra [name] 04/01/2015 8:41 AM

## 2015-04-08 ENCOUNTER — Telehealth: Payer: Self-pay | Admitting: Family Medicine

## 2015-04-08 NOTE — Telephone Encounter (Signed)
Asking to speak with pcp, says a nurse with [employer] came out to her house and was asking why she was anemic, then asked if she had sickle cell. Pt says her son has a sickle cell trait, then the nurse asked if she had ever been tested for it. Pt doesn't know the answers to these questions so she wants to speak with her doctor.

## 2015-04-11 MED ORDER — CEFAZOLIN SODIUM 10 G IJ SOLR
3.0000 g | INTRAMUSCULAR | Status: AC
  Administered 2015-04-12: 3 g via INTRAVENOUS
  Filled 2015-04-11: qty 3000

## 2015-04-12 ENCOUNTER — Observation Stay (HOSPITAL_COMMUNITY)
Admission: RE | Admit: 2015-04-12 | Discharge: 2015-04-13 | Disposition: A | Discharge status: Home / Self Care | Payer: Medicare Other | Source: Ambulatory Visit | Attending: Obstetrics & Gynecology | Admitting: Obstetrics & Gynecology

## 2015-04-12 ENCOUNTER — Ambulatory Visit (HOSPITAL_COMMUNITY): Payer: Medicare Other | Admitting: Anesthesiology

## 2015-04-12 ENCOUNTER — Encounter (HOSPITAL_COMMUNITY)
Admission: RE | Disposition: A | Discharge status: Home / Self Care | Payer: Self-pay | Source: Ambulatory Visit | Attending: Obstetrics & Gynecology

## 2015-04-12 ENCOUNTER — Encounter (HOSPITAL_COMMUNITY): Payer: Self-pay

## 2015-04-12 DIAGNOSIS — R739 Hyperglycemia, unspecified: Secondary | ICD-10-CM | POA: Insufficient documentation

## 2015-04-12 DIAGNOSIS — F1010 Alcohol abuse, uncomplicated: Secondary | ICD-10-CM | POA: Diagnosis not present

## 2015-04-12 DIAGNOSIS — Z9889 Other specified postprocedural states: Secondary | ICD-10-CM

## 2015-04-12 DIAGNOSIS — N938 Other specified abnormal uterine and vaginal bleeding: Secondary | ICD-10-CM | POA: Diagnosis present

## 2015-04-12 DIAGNOSIS — N840 Polyp of corpus uteri: Secondary | ICD-10-CM | POA: Insufficient documentation

## 2015-04-12 DIAGNOSIS — D259 Leiomyoma of uterus, unspecified: Secondary | ICD-10-CM | POA: Diagnosis not present

## 2015-04-12 DIAGNOSIS — M1991 Primary osteoarthritis, unspecified site: Secondary | ICD-10-CM | POA: Insufficient documentation

## 2015-04-12 DIAGNOSIS — D649 Anemia, unspecified: Secondary | ICD-10-CM | POA: Diagnosis not present

## 2015-04-12 DIAGNOSIS — Z6841 Body Mass Index (BMI) 40.0 and over, adult: Secondary | ICD-10-CM | POA: Insufficient documentation

## 2015-04-12 DIAGNOSIS — M5030 Other cervical disc degeneration, unspecified cervical region: Secondary | ICD-10-CM | POA: Insufficient documentation

## 2015-04-12 DIAGNOSIS — F17210 Nicotine dependence, cigarettes, uncomplicated: Secondary | ICD-10-CM | POA: Insufficient documentation

## 2015-04-12 DIAGNOSIS — D251 Intramural leiomyoma of uterus: Secondary | ICD-10-CM | POA: Diagnosis not present

## 2015-04-12 DIAGNOSIS — K219 Gastro-esophageal reflux disease without esophagitis: Secondary | ICD-10-CM | POA: Insufficient documentation

## 2015-04-12 DIAGNOSIS — I252 Old myocardial infarction: Secondary | ICD-10-CM | POA: Diagnosis not present

## 2015-04-12 DIAGNOSIS — Z79899 Other long term (current) drug therapy: Secondary | ICD-10-CM | POA: Insufficient documentation

## 2015-04-12 DIAGNOSIS — Z23 Encounter for immunization: Secondary | ICD-10-CM | POA: Insufficient documentation

## 2015-04-12 HISTORY — PX: ABDOMINAL HYSTERECTOMY: SHX81

## 2015-04-12 HISTORY — PX: CYSTOSCOPY: SHX5120

## 2015-04-12 LAB — PREGNANCY, URINE: PREG TEST UR: NEGATIVE

## 2015-04-12 LAB — CBC
HEMATOCRIT: 30.8 % — AB (ref 36.0–46.0)
HEMOGLOBIN: 8.9 g/dL — AB (ref 12.0–15.0)
MCH: 22.3 pg — ABNORMAL LOW (ref 26.0–34.0)
MCHC: 28.9 g/dL — ABNORMAL LOW (ref 30.0–36.0)
MCV: 77.2 fL — ABNORMAL LOW (ref 78.0–100.0)
Platelets: 307 10*3/uL (ref 150–400)
RBC: 3.99 MIL/uL (ref 3.87–5.11)
RDW: 17.9 % — AB (ref 11.5–15.5)
WBC: 6.2 10*3/uL (ref 4.0–10.5)

## 2015-04-12 LAB — PREPARE RBC (CROSSMATCH)

## 2015-04-12 SURGERY — ROBOTIC ASSISTED TOTAL HYSTERECTOMY WITH SALPINGECTOMY
Anesthesia: General | Site: Urethra

## 2015-04-12 MED ORDER — PROPOFOL 10 MG/ML IV BOLUS
INTRAVENOUS | Status: DC | PRN
  Administered 2015-04-12: 50 mg via INTRAVENOUS
  Administered 2015-04-12: 150 mg via INTRAVENOUS

## 2015-04-12 MED ORDER — ALBUTEROL SULFATE (2.5 MG/3ML) 0.083% IN NEBU: 3.0000 mL | INHALATION_SOLUTION | RESPIRATORY_TRACT | Status: DC | PRN

## 2015-04-12 MED ORDER — SUFENTANIL CITRATE 50 MCG/ML IV SOLN
INTRAVENOUS | Status: AC
  Filled 2015-04-12: qty 1

## 2015-04-12 MED ORDER — OXYCODONE-ACETAMINOPHEN 5-325 MG PO TABS
1.0000 | ORAL_TABLET | ORAL | Status: DC | PRN
  Administered 2015-04-13 (×2): 1 via ORAL
  Filled 2015-04-12 (×2): qty 1

## 2015-04-12 MED ORDER — IBUPROFEN 800 MG PO TABS
800.0000 mg | ORAL_TABLET | Freq: Three times a day (TID) | ORAL | Status: DC
  Administered 2015-04-12 – 2015-04-13 (×2): 800 mg via ORAL
  Filled 2015-04-12 (×2): qty 1

## 2015-04-12 MED ORDER — PROPOFOL 10 MG/ML IV BOLUS
INTRAVENOUS | Status: AC
  Filled 2015-04-12: qty 20

## 2015-04-12 MED ORDER — DOCUSATE SODIUM 100 MG PO CAPS
100.0000 mg | ORAL_CAPSULE | Freq: Two times a day (BID) | ORAL | Status: DC
  Administered 2015-04-12 – 2015-04-13 (×2): 100 mg via ORAL
  Filled 2015-04-12 (×2): qty 1

## 2015-04-12 MED ORDER — LIDOCAINE HCL (CARDIAC) 20 MG/ML IV SOLN
INTRAVENOUS | Status: DC | PRN
  Administered 2015-04-12: 100 mg via INTRAVENOUS

## 2015-04-12 MED ORDER — NEOSTIGMINE METHYLSULFATE 10 MG/10ML IV SOLN
INTRAVENOUS | Status: AC
  Filled 2015-04-12: qty 1

## 2015-04-12 MED ORDER — LACTATED RINGERS IV SOLN: INTRAVENOUS | Status: DC

## 2015-04-12 MED ORDER — FENTANYL CITRATE (PF) 100 MCG/2ML IJ SOLN
INTRAMUSCULAR | Status: AC
  Administered 2015-04-12: 50 ug via INTRAVENOUS
  Filled 2015-04-12: qty 2

## 2015-04-12 MED ORDER — PANTOPRAZOLE SODIUM 40 MG PO TBEC
40.0000 mg | DELAYED_RELEASE_TABLET | Freq: Every day | ORAL | Status: DC
  Administered 2015-04-13: 40 mg via ORAL
  Filled 2015-04-12: qty 1

## 2015-04-12 MED ORDER — ARTIFICIAL TEARS OP OINT
TOPICAL_OINTMENT | OPHTHALMIC | Status: DC | PRN
  Administered 2015-04-12: 1 via OPHTHALMIC

## 2015-04-12 MED ORDER — ONDANSETRON HCL 4 MG/2ML IJ SOLN
INTRAMUSCULAR | Status: DC | PRN
  Administered 2015-04-12: 4 mg via INTRAVENOUS

## 2015-04-12 MED ORDER — DEXAMETHASONE SODIUM PHOSPHATE 10 MG/ML IJ SOLN
INTRAMUSCULAR | Status: AC
  Filled 2015-04-12: qty 1

## 2015-04-12 MED ORDER — NEOSTIGMINE METHYLSULFATE 10 MG/10ML IV SOLN
INTRAVENOUS | Status: DC | PRN
  Administered 2015-04-12: 2.5 mg via INTRAVENOUS

## 2015-04-12 MED ORDER — HYDROMORPHONE HCL 1 MG/ML IJ SOLN
0.5000 mg | INTRAMUSCULAR | Status: DC | PRN
  Administered 2015-04-12 (×2): 0.5 mg via INTRAVENOUS
  Filled 2015-04-12 (×2): qty 1

## 2015-04-12 MED ORDER — GLYCOPYRROLATE 0.2 MG/ML IJ SOLN
INTRAMUSCULAR | Status: DC | PRN
  Administered 2015-04-12: .5 mg via INTRAVENOUS

## 2015-04-12 MED ORDER — ONDANSETRON HCL 4 MG/2ML IJ SOLN: 4.0000 mg | Freq: Four times a day (QID) | INTRAMUSCULAR | Status: DC | PRN

## 2015-04-12 MED ORDER — BISACODYL 10 MG RE SUPP: 10.0000 mg | Freq: Every day | RECTAL | Status: DC | PRN

## 2015-04-12 MED ORDER — BUPIVACAINE HCL (PF) 0.5 % IJ SOLN
INTRAMUSCULAR | Status: AC
  Filled 2015-04-12: qty 30

## 2015-04-12 MED ORDER — STERILE WATER FOR IRRIGATION IR SOLN
Status: DC | PRN
Start: 2015-04-12 — End: 2015-04-12
  Administered 2015-04-12: 3000 mL

## 2015-04-12 MED ORDER — DEXAMETHASONE SODIUM PHOSPHATE 10 MG/ML IJ SOLN
INTRAMUSCULAR | Status: DC | PRN
  Administered 2015-04-12: 10 mg via INTRAVENOUS

## 2015-04-12 MED ORDER — FENTANYL CITRATE (PF) 100 MCG/2ML IJ SOLN
25.0000 ug | INTRAMUSCULAR | Status: DC | PRN
  Administered 2015-04-12: 25 ug via INTRAVENOUS
  Administered 2015-04-12: 50 ug via INTRAVENOUS
  Administered 2015-04-12: 25 ug via INTRAVENOUS

## 2015-04-12 MED ORDER — SCOPOLAMINE 1 MG/3DAYS TD PT72
MEDICATED_PATCH | TRANSDERMAL | Status: AC
  Administered 2015-04-12: 1.5 mg via TRANSDERMAL
  Filled 2015-04-12: qty 1

## 2015-04-12 MED ORDER — SODIUM CHLORIDE 0.9 % IV SOLN: Freq: Once | INTRAVENOUS | Status: DC

## 2015-04-12 MED ORDER — PNEUMOCOCCAL VAC POLYVALENT 25 MCG/0.5ML IJ INJ
0.5000 mL | INJECTION | INTRAMUSCULAR | Status: AC
  Administered 2015-04-13: 0.5 mL via INTRAMUSCULAR
  Filled 2015-04-12: qty 0.5

## 2015-04-12 MED ORDER — FAMOTIDINE IN NACL 20-0.9 MG/50ML-% IV SOLN
20.0000 mg | Freq: Once | INTRAVENOUS | Status: AC
Start: 2015-04-12 — End: 2015-04-12
  Administered 2015-04-12: 20 mg via INTRAVENOUS
  Filled 2015-04-12: qty 50

## 2015-04-12 MED ORDER — SCOPOLAMINE 1 MG/3DAYS TD PT72
1.0000 | MEDICATED_PATCH | Freq: Once | TRANSDERMAL | Status: DC
  Administered 2015-04-12: 1.5 mg via TRANSDERMAL

## 2015-04-12 MED ORDER — POLYETHYLENE GLYCOL 3350 17 G PO PACK: 17.0000 g | PACK | Freq: Every day | ORAL | Status: DC | PRN

## 2015-04-12 MED ORDER — SUFENTANIL CITRATE 50 MCG/ML IV SOLN
INTRAVENOUS | Status: DC | PRN
  Administered 2015-04-12: 15 ug via INTRAVENOUS
  Administered 2015-04-12: 10 ug via INTRAVENOUS
  Administered 2015-04-12: 5 ug via INTRAVENOUS
  Administered 2015-04-12 (×2): 10 ug via INTRAVENOUS

## 2015-04-12 MED ORDER — GABAPENTIN 100 MG PO CAPS
100.0000 mg | ORAL_CAPSULE | Freq: Three times a day (TID) | ORAL | Status: DC
  Administered 2015-04-12 – 2015-04-13 (×2): 100 mg via ORAL
  Filled 2015-04-12 (×6): qty 1

## 2015-04-12 MED ORDER — MIDAZOLAM HCL 5 MG/5ML IJ SOLN
INTRAMUSCULAR | Status: DC | PRN
  Administered 2015-04-12: 2 mg via INTRAVENOUS

## 2015-04-12 MED ORDER — ONDANSETRON HCL 4 MG PO TABS: 4.0000 mg | ORAL_TABLET | Freq: Four times a day (QID) | ORAL | Status: DC | PRN

## 2015-04-12 MED ORDER — ONDANSETRON HCL 4 MG/2ML IJ SOLN: 4.0000 mg | Freq: Once | INTRAMUSCULAR | Status: DC | PRN

## 2015-04-12 MED ORDER — GLYCOPYRROLATE 0.2 MG/ML IJ SOLN
INTRAMUSCULAR | Status: AC
  Filled 2015-04-12: qty 4

## 2015-04-12 MED ORDER — METHYLENE BLUE 1 % INJ SOLN
INTRAMUSCULAR | Status: DC | PRN
  Administered 2015-04-12: 5 mg via INTRAVENOUS

## 2015-04-12 MED ORDER — LACTATED RINGERS IR SOLN
Status: DC | PRN
Start: 2015-04-12 — End: 2015-04-12
  Administered 2015-04-12: 3000 mL

## 2015-04-12 MED ORDER — ONDANSETRON HCL 4 MG/2ML IJ SOLN
INTRAMUSCULAR | Status: AC
  Filled 2015-04-12: qty 2

## 2015-04-12 MED ORDER — MIDAZOLAM HCL 2 MG/2ML IJ SOLN
INTRAMUSCULAR | Status: AC
  Filled 2015-04-12: qty 4

## 2015-04-12 MED ORDER — ROCURONIUM BROMIDE 100 MG/10ML IV SOLN
INTRAVENOUS | Status: DC | PRN
  Administered 2015-04-12: 10 mg via INTRAVENOUS
  Administered 2015-04-12: 20 mg via INTRAVENOUS
  Administered 2015-04-12: 50 mg via INTRAVENOUS
  Administered 2015-04-12: 10 mg via INTRAVENOUS

## 2015-04-12 MED ORDER — DEXTROSE-NACL 5-0.45 % IV SOLN
INTRAVENOUS | Status: DC
  Administered 2015-04-12 – 2015-04-13 (×2): via INTRAVENOUS

## 2015-04-12 MED ORDER — BUPIVACAINE HCL (PF) 0.5 % IJ SOLN
INTRAMUSCULAR | Status: DC | PRN
  Administered 2015-04-12: 30 mL

## 2015-04-12 MED ORDER — LACTATED RINGERS IV SOLN
INTRAVENOUS | Status: DC
  Administered 2015-04-12 (×2): via INTRAVENOUS

## 2015-04-12 MED ORDER — LIDOCAINE HCL (CARDIAC) 20 MG/ML IV SOLN
INTRAVENOUS | Status: AC
  Filled 2015-04-12: qty 5

## 2015-04-12 MED ORDER — METHYLENE BLUE 1 % INJ SOLN
INTRAMUSCULAR | Status: AC
  Filled 2015-04-12: qty 1

## 2015-04-12 MED ORDER — SIMETHICONE 80 MG PO CHEW: 80.0000 mg | CHEWABLE_TABLET | Freq: Four times a day (QID) | ORAL | Status: DC | PRN

## 2015-04-12 SURGICAL SUPPLY — 63 items
APPLIER CLIP 5 13 M/L LIGAMAX5 (MISCELLANEOUS)
BARRIER ADHS 3X4 INTERCEED (GAUZE/BANDAGES/DRESSINGS) IMPLANT
BENZOIN TINCTURE PRP APPL 2/3 (GAUZE/BANDAGES/DRESSINGS) ×4 IMPLANT
CATH FOLEY 3WAY  5CC 16FR (CATHETERS) ×2
CATH FOLEY 3WAY 5CC 16FR (CATHETERS) ×2 IMPLANT
CLIP APPLIE 5 13 M/L LIGAMAX5 (MISCELLANEOUS) IMPLANT
CLOSURE WOUND 1/2 X4 (GAUZE/BANDAGES/DRESSINGS) ×1
CLOTH BEACON ORANGE TIMEOUT ST (SAFETY) ×4 IMPLANT
CONT PATH 16OZ SNAP LID 3702 (MISCELLANEOUS) ×4 IMPLANT
COVER BACK TABLE 60X90IN (DRAPES) ×8 IMPLANT
COVER TIP SHEARS 8 DVNC (MISCELLANEOUS) ×2 IMPLANT
COVER TIP SHEARS 8MM DA VINCI (MISCELLANEOUS) ×2
DECANTER SPIKE VIAL GLASS SM (MISCELLANEOUS) ×4 IMPLANT
DEVICE TROCAR PUNCTURE CLOSURE (ENDOMECHANICALS) IMPLANT
DRAPE WARM FLUID 44X44 (DRAPE) ×4 IMPLANT
DRSG OPSITE POSTOP 3X4 (GAUZE/BANDAGES/DRESSINGS) ×4 IMPLANT
DURAPREP 26ML APPLICATOR (WOUND CARE) ×4 IMPLANT
ELECT REM PT RETURN 9FT ADLT (ELECTROSURGICAL) ×4
ELECTRODE REM PT RTRN 9FT ADLT (ELECTROSURGICAL) ×2 IMPLANT
GAUZE VASELINE 3X9 (GAUZE/BANDAGES/DRESSINGS) IMPLANT
GLOVE BIO SURGEON STRL SZ7 (GLOVE) ×8 IMPLANT
GLOVE BIOGEL PI IND STRL 7.0 (GLOVE) ×12 IMPLANT
GLOVE BIOGEL PI INDICATOR 7.0 (GLOVE) ×12
GOWN STRL REUS W/TWL XL LVL3 (GOWN DISPOSABLE) ×4 IMPLANT
KIT ACCESSORY DA VINCI DISP (KITS) ×2
KIT ACCESSORY DVNC DISP (KITS) ×2 IMPLANT
LEGGING LITHOTOMY PAIR STRL (DRAPES) ×4 IMPLANT
LIQUID BAND (GAUZE/BANDAGES/DRESSINGS) ×4 IMPLANT
NEEDLE HYPO 22GX1.5 SAFETY (NEEDLE) ×4 IMPLANT
NEEDLE INSUFFLATION 120MM (ENDOMECHANICALS) ×4 IMPLANT
NEEDLE INSUFFLATION 150MM (ENDOMECHANICALS) ×4 IMPLANT
OCCLUDER COLPOPNEUMO (BALLOONS) ×4 IMPLANT
PACK ROBOT WH (CUSTOM PROCEDURE TRAY) ×4 IMPLANT
PACK ROBOTIC GOWN (GOWN DISPOSABLE) ×4 IMPLANT
PAD POSITIONING PINK XL (MISCELLANEOUS) ×4 IMPLANT
PAD PREP 24X48 CUFFED NSTRL (MISCELLANEOUS) ×8 IMPLANT
SET CYSTO W/LG BORE CLAMP LF (SET/KITS/TRAYS/PACK) ×4 IMPLANT
SET IRRIG TUBING LAPAROSCOPIC (IRRIGATION / IRRIGATOR) ×4 IMPLANT
SET TRI-LUMEN FLTR TB AIRSEAL (TUBING) ×4 IMPLANT
STRIP CLOSURE SKIN 1/2X4 (GAUZE/BANDAGES/DRESSINGS) ×3 IMPLANT
SURGIFLO W/THROMBIN 8M KIT (HEMOSTASIS) IMPLANT
SUT VIC AB 0 CT1 27 (SUTURE) ×8
SUT VIC AB 0 CT1 27XBRD ANBCTR (SUTURE) ×4 IMPLANT
SUT VIC AB 0 CT1 27XBRD ANTBC (SUTURE) ×4 IMPLANT
SUT VICRYL 0 UR6 27IN ABS (SUTURE) ×8 IMPLANT
SUT VICRYL 4-0 PS2 18IN ABS (SUTURE) ×4 IMPLANT
SUT VLOC 180 0 9IN  GS21 (SUTURE) ×2
SUT VLOC 180 0 9IN GS21 (SUTURE) ×2 IMPLANT
SYR CONTROL 10ML LL (SYRINGE) ×4 IMPLANT
SYSTEM CONVERTIBLE TROCAR (TROCAR) IMPLANT
TIP RUMI ORANGE 6.7MMX12CM (TIP) IMPLANT
TIP UTERINE 5.1X6CM LAV DISP (MISCELLANEOUS) IMPLANT
TIP UTERINE 6.7X10CM GRN DISP (MISCELLANEOUS) ×4 IMPLANT
TIP UTERINE 6.7X6CM WHT DISP (MISCELLANEOUS) IMPLANT
TIP UTERINE 6.7X8CM BLUE DISP (MISCELLANEOUS) IMPLANT
TOWEL OR 17X24 6PK STRL BLUE (TOWEL DISPOSABLE) ×8 IMPLANT
TROCAR DILATING TIP 12MM 150MM (ENDOMECHANICALS) ×4 IMPLANT
TROCAR DISP BLADELESS 8 DVNC (TROCAR) ×4 IMPLANT
TROCAR DISP BLADELESS 8MM (TROCAR) ×4
TROCAR PORT AIRSEAL 5X120 (TROCAR) ×4 IMPLANT
TROCAR XCEL 12X100 BLDLESS (ENDOMECHANICALS) IMPLANT
TROCAR XCEL NON-BLD 5MMX100MML (ENDOMECHANICALS) ×4 IMPLANT
WATER STERILE IRR 1000ML POUR (IV SOLUTION) ×12 IMPLANT

## 2015-04-12 NOTE — Telephone Encounter (Signed)
Patient is likely anemic due to her dysfunctional uterine bleeding. Which she is having surgery for this week. Patient can come in for appointment to discuss the rest if needed.

## 2015-04-12 NOTE — Anesthesia Procedure Notes (Signed)
Procedure Name: Intubation Date/Time: 04/12/2015 11:12 AM Performed by: Ignacia Bayley Pre-anesthesia Checklist: Patient identified, Emergency Drugs available, Suction available and Patient being monitored Patient Re-evaluated:Patient Re-evaluated prior to inductionOxygen Delivery Method: Circle system utilized Preoxygenation: Pre-oxygenation with 100% oxygen Intubation Type: IV induction Ventilation: Mask ventilation without difficulty Laryngoscope Size: Miller and 2 Grade View: Grade II Tube type: Oral Tube size: 7.0 mm Number of attempts: 1 Airway Equipment and Method: Stylet Placement Confirmation: ETT inserted through vocal cords under direct vision,  positive ETCO2 and breath sounds checked- equal and bilateral Secured at: 20 cm Tube secured with: Tape Dental Injury: Teeth and Oropharynx as per pre-operative assessment

## 2015-04-12 NOTE — Anesthesia Preprocedure Evaluation (Addendum)
Anesthesia Evaluation  Patient identified by MRN, date of birth, ID band Patient awake    Reviewed: Allergy & Precautions, NPO status , Patient's Chart, lab work & pertinent test results  Airway Mallampati: II  TM Distance: >3 FB Neck ROM: Full    Dental  (+) Teeth Intact, Dental Advisory Given   Pulmonary Current Smoker,  1.5 PPD smoker Uses Albuterol twice per day breath sounds clear to auscultation  Pulmonary exam normal       Cardiovascular - angina- Past MI negative cardio ROS Normal cardiovascular examRhythm:Regular Rate:Normal     Neuro/Psych negative neurological ROS     GI/Hepatic Neg liver ROS, GERD-  ,  Endo/Other  Morbid obesity   Renal/GU negative Renal ROS  Female GU complaint     Musculoskeletal  (+) Arthritis -, Osteoarthritis,    Abdominal   Peds  Hematology  (+) Blood dyscrasia, anemia ,   Anesthesia Other Findings Day of surgery medications reviewed with the patient.  Patient drank 1 can of soda at 0845.  Will need to wait until 1045 (2 hours) for clear liquid intake.  Reproductive/Obstetrics                           Anesthesia Physical Anesthesia Plan  ASA: III  Anesthesia Plan: General   Post-op Pain Management:    Induction: Intravenous  Airway Management Planned: Oral ETT  Additional Equipment:   Intra-op Plan:   Post-operative Plan: Extubation in OR  Informed Consent: I have reviewed the patients History and Physical, chart, labs and discussed the procedure including the risks, benefits and alternatives for the proposed anesthesia with the patient or authorized representative who has indicated his/her understanding and acceptance.   Dental advisory given  Plan Discussed with: CRNA  Anesthesia Plan Comments: (Risks/benefits of general anesthesia discussed with patient including risk of damage to teeth, lips, gum, and tongue, nausea/vomiting,  allergic reactions to medications, and the possibility of heart attack, stroke and death.  All patient questions answered.  Patient wishes to proceed.)        Anesthesia Quick Evaluation

## 2015-04-12 NOTE — Transfer of Care (Signed)
Immediate Anesthesia Transfer of Care Note  Patient: Kendra [name]  Procedure(s) Performed: Procedure(s) with comments: ROBOTIC ASSISTED TOTAL HYSTERECTOMY WITH BILATERAL SALPINGECTOMY (Bilateral) - Requested 04/12/15 @ 1:00p CYSTOSCOPY (N/A)  Patient Location: PACU  Anesthesia Type:General  Level of Consciousness: awake and sedated  Airway & Oxygen Therapy: Patient Spontanous Breathing and Patient connected to nasal cannula oxygen  Post-op Assessment: Report given to RN and Post -op Vital signs reviewed and stable  Post vital signs: stable  Last Vitals:  Filed Vitals:   04/12/15 0923  BP: 120/56  Pulse: 91  Temp: 36.8 C  Resp: 22    Complications: No apparent anesthesia complications

## 2015-04-12 NOTE — Brief Op Note (Signed)
04/12/2015  2:46 PM  PATIENT:  Kendra [name]  49 y.o. female  PRE-OPERATIVE DIAGNOSIS:  cpt [phone] - Dysfunctional uterine bleeding with fibroid uterus with multiple large fibroids  POST-OPERATIVE DIAGNOSIS:  cpt [phone], dysfunctional uterine bleeding with fibroid uterus with large multiple fibroids  PROCEDURE:  Procedure(s) with comments: ROBOTIC ASSISTED TOTAL HYSTERECTOMY WITH BILATERAL SALPINGECTOMY (Bilateral) - Requested 04/12/15 @ 1:00p CYSTOSCOPY (N/A)  SURGEON:  Surgeon(s) and Role:    * Lavonia Drafts, MD - Primary    * Mora Bellman, MD - Assisting  ANESTHESIA:   general  EBL:  Total I/O In: 1500 [I.V.:1500] Out: 550 [Urine:350; Blood:200]  BLOOD ADMINISTERED:none  DRAINS: none   LOCAL MEDICATIONS USED:  MARCAINE     SPECIMEN:  Source of Specimen:  uterus, cervix and fallopian tubes  DISPOSITION OF SPECIMEN:  PATHOLOGY  COUNTS:  YES  TOURNIQUET:  * No tourniquets in log *  DICTATION: .Note written in EPIC  PLAN OF CARE: Admit for overnight observation  PATIENT DISPOSITION:  PACU - hemodynamically stable.   Delay start of Pharmacological VTE agent (>24hrs) due to surgical blood loss or risk of bleeding: yes  Complications: none immediate

## 2015-04-12 NOTE — H&P (Signed)
Expand All Collapse All   Patient ID: Kendra [name], female DOB: 13-Dec-1965, 49 y.o. MRN: [id number] Preoperative History and Physical  LIZBETH FEIJOO is a 49 y.o. [phone] here for surgical management of pelvic pain. Pt reports that she has not had menses since May. She reports that she has pain that feels like menstrual cramps and has had pain with menses since menarche. Now the pain occurs weekly and is 10/10 when it occurs. She takes muscles relaxants and Tramadol for the pain.   Proposed surgery: Robot Assisted Total Laparoscopic hysterectomy with bilateral salpingectomy.   Past Medical History  Diagnosis Date  . Anemia    Past Surgical History  Procedure Laterality Date  . Cesarean section    . Tubal ligation    . Tibia fracture surgery    . Ankle surgery Left   . Finger surgery     OB History    Gravida Para Term Preterm AB TAB SAB Ectopic Multiple Living   3 3 3  0 0 0 0 0 0 3     Patient denies any cervical dysplasia or STIs.  (Not in a hospital admission)  No Known Allergies Social History:  reports that she has been smoking Cigarettes. She started smoking about 12 years ago. She has been smoking about 0.25 packs per day. She has never used smokeless tobacco. She reports that she drinks alcohol. She reports that she does not use illicit drugs. Family History  Problem Relation Age of Onset  . Diabetes Mother   . Heart disease Mother   . Cancer Father 48    lung    Review of Systems: Noncontributory  PHYSICAL EXAM: BP 120/56 mmHg  Pulse 91  Temp(Src) 98.2 F (36.8 C) (Oral)  Resp 22  SpO2 98%  LMP 02/21/2015 (Approximate)  General appearance - alert, well appearing, and in no distress; Pt morbidly obese Chest - clear to auscultation, no wheezes, rales or rhonchi, symmetric air entry Heart - normal rate and regular rhythm Abdomen - soft, nontender, nondistended, no  masses or organomegaly Pelvic - uterus 16 weeks sized. Mobile; adnexa: difficult to appreciate due to body habitus  Extremities - peripheral pulses normal, no pedal edema, no clubbing or cyanosis  CBC Latest Ref Rng 04/01/2015 01/11/2015 03/09/2014  WBC 4.0 - 10.5 K/uL 7.2 7.5 8.0  Hemoglobin 12.0 - 15.0 g/dL 8.5(L) 7.6(L) 7.5(L)  Hematocrit 36.0 - 46.0 % 29.6(L) 26.3(L) 26.4(L)  Platelets 150 - 400 K/uL 315 470(H) 339    Imaging Studies: 01/13/2015 CLINICAL DATA: Dysfunctional uterine bleeding.  EXAM: TRANSABDOMINAL AND TRANSVAGINAL ULTRASOUND OF PELVIS  TECHNIQUE: Both transabdominal and transvaginal ultrasound examinations of the pelvis were performed. Transabdominal technique was performed for global imaging of the pelvis including uterus, ovaries, adnexal regions, and pelvic cul-de-sac. It was necessary to proceed with endovaginal exam following the transabdominal exam to visualize the uterus and ovaries.  COMPARISON: None  FINDINGS: Uterus  Measurements: 16.0 x 8.6 x 9.9 cm. Multiple uterine fibroids are noted. The largest anterior fibroid measures 5.2 cm in maximum diameter, the largest posterior fibroid measures 4.7 cm in maximum diameter.  Endometrium  Thickness: 17 mm. No focal abnormality visualized.  Right ovary  Measurements: 3.7 x 2.1 x 2.4 cm. Normal appearance/no adnexal mass.  Left ovary  Measurements: 3.2 x 1.5 x 1.6 cm. Normal appearance/no adnexal mass.  Other findings  No free fluid.  IMPRESSION: 1. Fibroid uterus with multiple large fibroids. 2. Endometrial thickening to 17 mm. If bleeding remains  unresponsive to hormonal or medical therapy, focal lesion work-up with sonohysterogram should be considered. Endometrial biopsy should also be considered in pre-menopausal patients at high risk for endometrial carcinoma. (Ref: Radiological Reasoning: Algorithmic Workup of Abnormal Vaginal Bleeding with Endovaginal Sonography  and Sonohysterography. AJR 2008; 315:V76-16) Assessment: Patient Active Problem List   Diagnosis Date Noted  . Uterine fibroid 01/14/2015  . Endometrial thickening on ultra sound 01/14/2015  . Dysfunctional uterine bleeding 01/14/2015  . Tendinopathy of right rotator cuff 08/24/2014  . Hyperglycemia 06/01/2013  . Weight gain 06/01/2013  . Cervical neck pain with evidence of disc disease 05/14/2012  . Osteoarthrosis, unspecified whether generalized or localized, involving lower leg 05/14/2012  . Abdominal pain 03/14/2012  . Numbness and tingling of right arm most likely related to cervical radiculopathy 11/06/2011  . Chronic low back pain 12/20/2010  . Morbid obesity 04/07/2008  . ALCOHOL ABUSE 04/07/2008  . TOBACCO ABUSE 04/07/2008  . ANEMIA, IRON DEFICIENCY, UNSPEC. 10/24/2006  . GASTROESOPHAGEAL REFLUX, NO ESOPHAGITIS 10/24/2006    Plan: Patient will undergo surgical management with Robot Assisted Total Laparoscopic hysterectomy with bilateral salpingectomy with removal of the ovaries if indicated. The risks of surgery were discussed in detail with the patient including but not limited to: bleeding which may require transfusion or reoperation; infection which may require prolonged hospitalization or re-hospitalization and antibiotic therapy; injury to bowel, bladder, ureters and major vessels or other surrounding organs; need for additional procedures including laparotomy; thromboembolic phenomenon, incisional problems and other postoperative or anesthesia complications. Patient was told that the likelihood that her condition and symptoms will be treated effectively with this surgical management was very high; the postoperative expectations were also discussed in detail. The patient also understands the alternative treatment options which were discussed in full. All questions were answered.

## 2015-04-12 NOTE — Anesthesia Postprocedure Evaluation (Signed)
  Anesthesia Post-op Note  Patient: Kendra [name]  Procedure(s) Performed: Procedure(s) with comments: ROBOTIC ASSISTED TOTAL HYSTERECTOMY WITH BILATERAL SALPINGECTOMY (Bilateral) - Requested 04/12/15 @ 1:00p CYSTOSCOPY (N/A)  Patient Location: PACU  Anesthesia Type:General  Level of Consciousness: awake  Airway and Oxygen Therapy: Patient Spontanous Breathing  Post-op Pain: mild  Post-op Assessment: Post-op Vital signs reviewed, Patient's Cardiovascular Status Stable, Respiratory Function Stable, Patent Airway and No signs of Nausea or vomiting              Post-op Vital Signs: Reviewed and stable  Last Vitals:  Filed Vitals:   04/12/15 1430  BP: 118/66  Pulse: 71  Temp:   Resp: 20    Complications: No apparent anesthesia complications

## 2015-04-12 NOTE — Op Note (Signed)
04/12/2015  2:46 PM  PATIENT:  Kendra [name]  49 y.o. female  PRE-OPERATIVE DIAGNOSIS:  cpt [phone] - Dysfunctional uterine bleeding with fibroid uterus with multiple large fibroids  POST-OPERATIVE DIAGNOSIS:  cpt [phone], dysfunctional uterine bleeding with fibroid uterus with large multiple fibroids  PROCEDURE:  Procedure(s) with comments: ROBOTIC ASSISTED TOTAL HYSTERECTOMY WITH BILATERAL SALPINGECTOMY (Bilateral) - Requested 04/12/15 @ 1:00p CYSTOSCOPY (N/A)  SURGEON:  Surgeon(s) and Role:    * Lavonia Drafts, MD - Primary    * Mora Bellman, MD - Assisting  ANESTHESIA:   general  EBL:  Total I/O In: 1500 [I.V.:1500] Out: 550 [Urine:350; Blood:200]  BLOOD ADMINISTERED:none  DRAINS: none   LOCAL MEDICATIONS USED:  MARCAINE     SPECIMEN:  Source of Specimen:  uterus, cervix and fallopian tubes  DISPOSITION OF SPECIMEN:  PATHOLOGY  COUNTS:  YES  TOURNIQUET:  * No tourniquets in log *  DICTATION: .Note written in EPIC  PLAN OF CARE: Admit for overnight observation  PATIENT DISPOSITION:  PACU - hemodynamically stable.   Delay start of Pharmacological VTE agent (>24hrs) due to surgical blood loss or risk of bleeding: yes  Complications: none immediate   Indications: The patient is a 49 yo G3P3 with a prior history of a bilateral tubal ligation who presents for definitive treatment of AUB and fibroids. The risks, benefits, and alternatives of surgery were explained, understood, and accepted. Consents were signed. All questions were answered.   Procedure:  The patient was taken to the operating room and general anesthesia was applied without complication. She was placed in the dorsal lithotomy position and her abdomen and vagina were prepped and draped after she had been carefully positioned on the table. A bimanual exam revealed a 16 week size uterus that was mobile. Her adnexa were not enlarged. The cervix was measured and the uterus was sounded to 12 cm. A Rumi  uterine manipulator was placed without difficulty. A Foley catheter was placed and it drained clear throughout the case. Gloves were changed and attention was turned to the abdomen. A 15mm incision was made in the umbilicus and a Veress needle was placed intraperitoneally. CO2 was used to insufflate the abdomen to approximately 4 L. After good pneumoperitoneum was established, a 12 mm trocar was placed 10 cm above the umbilicus.  Laparoscopy confirmed correct placement. She was placed in Trendelenburg position and ports were placed in appropriate positions on her abdomen to allow maximum exposure during the robotic case. Specifically there was an 82mm assistant port placed in the left upper quadrant under direct laproscopic visualization. Two 8 mm ports were placed lateral and inferior to the midline port.  These were all placed under direct laparoscopic visualization. The robot was docked and I proceeded with a robotic portion of the case.  The pelvis was inspected and the uterus was found to have fibroids and be enlarged.  The fallopian tubes and ovaries were found to be normal. The remainder of her pelvis appeared normal with the exception of adhesions of bowel to the adnexa and sidewall on the left side.  These were released sharply.  I excised the fallopian tubes bilaterally. The round ligament on each side was cauterized and cut. The PK/gyrus instrument was used for this portion. The round ligaments were identified, cauterized and ligated, a bladder flap was created anteriorly. The uterine vessels were identified and cauterized and then cut.The bladder was pushed out of the operative site and an anterior colpotomy was made. The colpotomy incision was extended  circumferentially, following the blue outline of the Rumi manipulator. All pedicles were hemostatic.  The uterus was removed from the vagina with the fallopian tube segments. The vaginal cuff was closed with v-lock suture.  Excellent hemostasis was noted  throughout. The pelvis was irrigated. The intraabdominal pressure was lowered assess hemostasis. After determining excellent hemostasis, the robot was undocked. At this point I performed cystoscopy. The cystoscopy revealed fluid ejection from both ureters.  The midline fascial incision was closed with 0 vicryl.  The skin from the midline port was closed with 3-0 vicryl. 30cc of 0.5% Marcaine was injected into the port sites.  All other incisions were closed with Dermabond. The patient was then extubated and taken to recovery in stable condition.   Sponge, lap and needle counts were correct x 2.  [name], M.D., [name]

## 2015-04-13 ENCOUNTER — Encounter (HOSPITAL_COMMUNITY): Payer: Self-pay | Admitting: Obstetrics & Gynecology

## 2015-04-13 DIAGNOSIS — Z79899 Other long term (current) drug therapy: Secondary | ICD-10-CM | POA: Diagnosis not present

## 2015-04-13 DIAGNOSIS — D259 Leiomyoma of uterus, unspecified: Secondary | ICD-10-CM | POA: Diagnosis not present

## 2015-04-13 DIAGNOSIS — M1991 Primary osteoarthritis, unspecified site: Secondary | ICD-10-CM | POA: Diagnosis not present

## 2015-04-13 DIAGNOSIS — Z9889 Other specified postprocedural states: Secondary | ICD-10-CM | POA: Diagnosis not present

## 2015-04-13 DIAGNOSIS — M5030 Other cervical disc degeneration, unspecified cervical region: Secondary | ICD-10-CM | POA: Diagnosis not present

## 2015-04-13 DIAGNOSIS — D649 Anemia, unspecified: Secondary | ICD-10-CM | POA: Diagnosis not present

## 2015-04-13 DIAGNOSIS — Z23 Encounter for immunization: Secondary | ICD-10-CM | POA: Diagnosis not present

## 2015-04-13 DIAGNOSIS — F17210 Nicotine dependence, cigarettes, uncomplicated: Secondary | ICD-10-CM | POA: Diagnosis not present

## 2015-04-13 DIAGNOSIS — F1010 Alcohol abuse, uncomplicated: Secondary | ICD-10-CM | POA: Diagnosis not present

## 2015-04-13 DIAGNOSIS — D251 Intramural leiomyoma of uterus: Secondary | ICD-10-CM | POA: Diagnosis not present

## 2015-04-13 DIAGNOSIS — N840 Polyp of corpus uteri: Secondary | ICD-10-CM | POA: Diagnosis not present

## 2015-04-13 DIAGNOSIS — N938 Other specified abnormal uterine and vaginal bleeding: Secondary | ICD-10-CM

## 2015-04-13 DIAGNOSIS — R739 Hyperglycemia, unspecified: Secondary | ICD-10-CM | POA: Diagnosis not present

## 2015-04-13 DIAGNOSIS — K219 Gastro-esophageal reflux disease without esophagitis: Secondary | ICD-10-CM | POA: Diagnosis not present

## 2015-04-13 DIAGNOSIS — I252 Old myocardial infarction: Secondary | ICD-10-CM | POA: Diagnosis not present

## 2015-04-13 DIAGNOSIS — Z6841 Body Mass Index (BMI) 40.0 and over, adult: Secondary | ICD-10-CM | POA: Diagnosis not present

## 2015-04-13 LAB — CBC
HCT: 27.4 % — ABNORMAL LOW (ref 36.0–46.0)
HEMOGLOBIN: 8.1 g/dL — AB (ref 12.0–15.0)
MCH: 22.8 pg — AB (ref 26.0–34.0)
MCHC: 29.6 g/dL — ABNORMAL LOW (ref 30.0–36.0)
MCV: 77 fL — AB (ref 78.0–100.0)
Platelets: 261 10*3/uL (ref 150–400)
RBC: 3.56 MIL/uL — AB (ref 3.87–5.11)
RDW: 17.9 % — ABNORMAL HIGH (ref 11.5–15.5)
WBC: 11 10*3/uL — ABNORMAL HIGH (ref 4.0–10.5)

## 2015-04-13 LAB — BASIC METABOLIC PANEL
Anion gap: 6 (ref 5–15)
BUN: 9 mg/dL (ref 6–20)
CHLORIDE: 109 mmol/L (ref 101–111)
CO2: 24 mmol/L (ref 22–32)
Calcium: 9.6 mg/dL (ref 8.9–10.3)
Creatinine, Ser: 0.8 mg/dL (ref 0.44–1.00)
GFR calc Af Amer: >60 mL/min (ref >60)
GFR calc non Af Amer: >60 mL/min (ref >60)
Glucose, Bld: 142 mg/dL — ABNORMAL HIGH (ref 65–99)
POTASSIUM: 4.2 mmol/L (ref 3.5–5.1)
SODIUM: 139 mmol/L (ref 135–145)

## 2015-04-13 MED ORDER — DOCUSATE SODIUM 100 MG PO CAPS: 100.0000 mg | ORAL_CAPSULE | Freq: Two times a day (BID) | ORAL | Status: DC

## 2015-04-13 NOTE — Discharge Instructions (Signed)
Total Laparoscopic Hysterectomy, Care After °Refer to this sheet in the next few weeks. These instructions provide you with information on caring for yourself after your procedure. Your health care provider may also give you more specific instructions. Your treatment has been planned according to current medical practices, but problems sometimes occur. Call your health care provider if you have any problems or questions after your procedure. °WHAT TO EXPECT AFTER THE PROCEDURE °· Pain and bruising at the incision sites. You will be given pain medicine to control it. °· Menopausal symptoms such as hot flashes, night sweats, and insomnia if your ovaries were removed. °· Sore throat from the breathing tube that was inserted during surgery. °HOME CARE INSTRUCTIONS °· Only take over-the-counter or prescription medicines for pain, discomfort, or fever as directed by your health care provider.   °· Do not take aspirin. It can cause bleeding.   °· Do not drive when taking pain medicine.   °· Follow your health care provider's advice regarding diet, exercise, lifting, driving, and general activities.   °· Resume your usual diet as directed and allowed.   °· Get plenty of rest and sleep.   °· Do not douche, use tampons, or have sexual intercourse for at least 6 weeks, or until your health care provider gives you permission.   °· Change your bandages (dressings) as directed by your health care provider.   °· Monitor your temperature and notify your health care provider of a fever.   °· Take showers instead of baths for 2-3 weeks.   °· Do not drink alcohol until your health care provider gives you permission.   °· If you develop constipation, you may take a mild laxative with your health care provider's permission. Bran foods may help with constipation problems. Drinking enough fluids to keep your urine clear or pale yellow may help as well.   °· Try to have someone home with you for 1-2 weeks to help around the house.    °· Keep all of your follow-up appointments as directed by your health care provider.   °SEEK MEDICAL CARE IF: °· You have swelling, redness, or increasing pain around your incision sites.   °· You have pus coming from your incision.   °· You notice a bad smell coming from your incision.   °· Your incision breaks open.   °· You feel dizzy or lightheaded.   °· You have pain or bleeding when you urinate.   °· You have persistent diarrhea.   °· You have persistent nausea and vomiting.   °· You have abnormal vaginal discharge.   °· You have a rash.   °· You have any type of abnormal reaction or develop an allergy to your medicine.   °· You have poor pain control with your prescribed medicine.   °SEEK IMMEDIATE MEDICAL CARE IF: °· You have chest pain or shortness of breath. °· You have severe abdominal pain that is not relieved with pain medicine. °· You have pain or swelling in your legs. °MAKE SURE YOU: °· Understand these instructions. °· Will watch your condition. °· Will get help right away if you are not doing well or get worse. °Document Released: 06/03/2013 Document Revised: 08/18/2013 Document Reviewed: 06/03/2013 °[employer]® Patient Information ©2015 [employer]. This information is not intended to replace advice given to you by your health care provider. Make sure you discuss any questions you have with your health care provider. ° °

## 2015-04-13 NOTE — Discharge Summary (Signed)
Physician Discharge Summary  Patient ID: Kendra [name] MRN: [id number] DOB/AGE: October 04, 1965 49 y.o.  Admit date: 04/12/2015 Discharge date: 04/13/2015  Admission Diagnoses: uterine fibroids and menorrhagia   Discharge Diagnoses: as above Active Problems:   Postoperative state   Discharged Condition: good  Hospital Course: Patient had an uncomplicated surgery; for further details of this surgery, please refer to the operative note. Furthermore, the patient had an uncomplicated postoperative course.  By time of discharge, her pain was controlled on oral pain medications; she was ambulating, voiding without difficulty, tolerating regular diet and passing flatus.  She was deemed stable for discharge to home.  I reviewed the surgery and operative findings with her. All of her questions were answered.    Consults: None  Significant Diagnostic Studies: labs: CBC, BMP  Treatments: IV hydration and surgery: Robotic assisted total laparoscopic hysterectomy with bilateral salpingectomy  Discharge Exam: Blood pressure 108/84, pulse 74, temperature 98.6 F (37 C), temperature source Oral, resp. rate 18, height 5\' 4"  (1.626 m), weight 276 lb (125.193 kg), last menstrual period 02/21/2015, SpO2 100 %.   I/O last 3 completed shifts: In: 4136.3 [P.O.:680; I.V.:3406.3; IV Piggyback:50] Out: 4200 [Urine:4000; Blood:200]    General appearance: alert and no distress Resp: clear to auscultation bilaterally Cardio: regular rate and rhythm, S1, S2 normal, no murmur, click, rub or gallop GI: soft, non-tender; bowel sounds normal; no masses,  no organomegaly Extremities: extremities normal, atraumatic, no cyanosis or edema Incision/Wound:clean and dry.  All port sites intact  CBC Latest Ref Rng 04/13/2015 04/12/2015 04/01/2015  WBC 4.0 - 10.5 K/uL 11.0(H) 6.2 7.2  Hemoglobin 12.0 - 15.0 g/dL 8.1(L) 8.9(L) 8.5(L)  Hematocrit 36.0 - 46.0 % 27.4(L) 30.8(L) 29.6(L)  Platelets 150 - 400 K/uL 261 307 315    BMP Latest Ref Rng 04/13/2015 03/09/2014 12/31/2013  Glucose 65 - 99 mg/dL 142(H) 93 94  BUN 6 - 20 mg/dL 9 9 12   Creatinine 0.44 - 1.00 mg/dL 0.80 0.82 0.76  Sodium 135 - 145 mmol/L 139 143 138  Potassium 3.5 - 5.1 mmol/L 4.2 4.5 4.5  Chloride 101 - 111 mmol/L 109 107 109  CO2 22 - 32 mmol/L 24 23 22   Calcium 8.9 - 10.3 mg/dL 9.6 9.8 9.5      Disposition: 01-Home or Self Care  Discharge Instructions    Call MD for:  difficulty breathing, headache or visual disturbances    Complete by:  As directed      Call MD for:  extreme fatigue    Complete by:  As directed      Call MD for:  hives    Complete by:  As directed      Call MD for:  persistant dizziness or light-headedness    Complete by:  As directed      Call MD for:  persistant nausea and vomiting    Complete by:  As directed      Call MD for:  redness, tenderness, or signs of infection (pain, swelling, redness, odor or green/yellow discharge around incision site)    Complete by:  As directed      Call MD for:  severe uncontrolled pain    Complete by:  As directed      Call MD for:  temperature >100.4    Complete by:  As directed      Diet - low sodium heart healthy    Complete by:  As directed      Discharge wound care:    Complete  by:  As directed   Keep ports sites clean and dry.  DO NOT remove glue.    Remove out midline dressing in 6 days     Driving Restrictions    Complete by:  As directed   No driving for 2 weeks or while on pain medications     Increase activity slowly    Complete by:  As directed      Lifting restrictions    Complete by:  As directed   No heavy lifting for 4 weeks     Sexual Activity Restrictions    Complete by:  As directed   No sexual activity and NOTHING in the vagina for 8 weeks            Medication List    STOP taking these medications        traMADol 50 MG tablet  Commonly known as:  ULTRAM      TAKE these medications        albuterol 108 (90 BASE) MCG/ACT inhaler   Commonly known as:  PROVENTIL HFA;VENTOLIN HFA  Inhale 2 puffs into the lungs every 4 (four) hours as needed for wheezing or shortness of breath.     alum & mag hydroxide-simeth 200-200-20 MG/5ML suspension  Commonly known as:  MAALOX/MYLANTA  Take 30 mLs by mouth every 6 (six) hours as needed for indigestion or heartburn.     docusate sodium 100 MG capsule  Commonly known as:  COLACE  Take 1 capsule (100 mg total) by mouth 2 (two) times daily.     ferrous sulfate 220 (44 FE) MG/5ML solution  Take 5 mLs (220 mg total) by mouth 3 (three) times daily with meals.     gabapentin 100 MG capsule  Commonly known as:  NEURONTIN  Take 1 capsule (100 mg total) by mouth 3 (three) times daily.           Follow-up Information    Follow up with Lavonia Drafts, MD In 2 weeks.   Specialty:  Obstetrics and Gynecology   Contact information:   [place] [address] [phone]       Signed: Lavonia Drafts 04/13/2015, 10:51 AM

## 2015-04-13 NOTE — Addendum Note (Signed)
Addendum  created 04/13/15 0934 by Talbot Grumbling, CRNA   Modules edited: Notes Section   Notes Section:  File: [id number]

## 2015-04-13 NOTE — Progress Notes (Signed)
Pt ambulated out teaching complete  Assessment complete  And ready for d/c

## 2015-04-13 NOTE — Anesthesia Postprocedure Evaluation (Signed)
Anesthesia Post Note  Patient: Kendra [name]  Procedure(s) Performed: Procedure(s) (LRB): ROBOTIC ASSISTED TOTAL HYSTERECTOMY WITH BILATERAL SALPINGECTOMY (Bilateral) CYSTOSCOPY (N/A)  Anesthesia type: General  Patient location: Mother/Baby  Post pain: Pain level controlled  Post assessment: Post-op Vital signs reviewed  Last Vitals:  Filed Vitals:   04/13/15 0540  BP: 111/57  Pulse: 53  Temp: 37 C  Resp: 16    Post vital signs: Reviewed  Level of consciousness: awake and alert   Complications: No apparent anesthesia complications

## 2015-04-14 ENCOUNTER — Encounter: Payer: Self-pay | Admitting: *Deleted

## 2015-04-15 NOTE — Progress Notes (Signed)
   04/13/15 1100  What Happened  Was fall witnessed? No  Was patient injured? No  Patient found other (Comment) (pt stated she fell in bathroom at 0930 getting out of shower)  Found by Staff-comment Lavella Lemons Corbett RN and Naida Sleight NT)  Stated prior activity shower  Follow Up  MD notified Dr. Ihor Dow  Time MD notified 60  Family notified (Not Applicable)  Additional tests No  Simple treatment Dressing  Adult Fall Risk Assessment  Risk Factor Category (scoring not indicated) Fall has occurred during this admission (document High fall risk)  Age 49  Fall History: Fall within 6 months prior to admission 0  Elimination; Bowel and/or Urine Incontinence 0  Elimination; Bowel and/or Urine Urgency/Frequency 0  Medications: includes PCA/Opiates, Anti-convulsants, Anti-hypertensives, Diuretics, Hypnotics, Laxatives, Sedatives, and Psychotropics 7  Patient Care Equipment 0  Mobility-Assistance 0  Mobility-Gait 0  Mobility-Sensory Deficit 0  Cognition-Awareness 0  Cognition-Impulsiveness 0  Cognition-Limitations 0  Total Score 7  Patient's Fall Risk Moderate Fall Risk (6-13 points)  Adult Fall Risk Interventions  Required Bundle Interventions *See Row Information* Moderate fall risk - low and moderate requirements implemented  Fall with Injury Screening  Risk For Fall Injury- See Row Information  None identified

## 2015-04-16 LAB — TYPE AND SCREEN
ABO/RH(D): O POS
Antibody Screen: NEGATIVE
UNIT DIVISION: 0
Unit division: 0
Unit division: 0
Unit division: 0

## 2015-04-22 ENCOUNTER — Ambulatory Visit: Payer: Medicare Other | Admitting: Obstetrics & Gynecology

## 2015-04-22 NOTE — Telephone Encounter (Signed)
Pt wanted to come in ASAP to get check for sickle cell. Scheduled for next Tuesday. Deseree Kennon Holter, CMA

## 2015-04-26 ENCOUNTER — Ambulatory Visit: Payer: Self-pay | Admitting: Family Medicine

## 2015-04-27 ENCOUNTER — Ambulatory Visit (INDEPENDENT_AMBULATORY_CARE_PROVIDER_SITE_OTHER): Payer: Medicare Other | Admitting: Obstetrics & Gynecology

## 2015-04-27 ENCOUNTER — Encounter: Payer: Self-pay | Admitting: Obstetrics & Gynecology

## 2015-04-27 VITALS — BP 117/62 | HR 101 | Temp 98.4°F | Wt 268.9 lb

## 2015-04-27 DIAGNOSIS — Z9889 Other specified postprocedural states: Secondary | ICD-10-CM | POA: Diagnosis not present

## 2015-04-27 DIAGNOSIS — N390 Urinary tract infection, site not specified: Secondary | ICD-10-CM | POA: Diagnosis not present

## 2015-04-27 LAB — POCT URINALYSIS DIP (DEVICE)
Glucose, UA: NEGATIVE mg/dL
Ketones, ur: NEGATIVE mg/dL
NITRITE: NEGATIVE
PH: 5.5 (ref 5.0–8.0)
PROTEIN: NEGATIVE mg/dL
Specific Gravity, Urine: >=1.03 (ref 1.005–1.030)
UROBILINOGEN UA: 0.2 mg/dL (ref 0.0–1.0)

## 2015-04-27 MED ORDER — SULFAMETHOXAZOLE-TRIMETHOPRIM 800-160 MG PO TABS: 1.0000 | ORAL_TABLET | Freq: Two times a day (BID) | ORAL | Status: DC

## 2015-04-27 NOTE — Progress Notes (Signed)
Patient ID: Kendra [name], female   DOB: 01-12-1966, 49 y.o.   MRN: [id number] History:  49 y.o. G3P3003 here today for 2 week post op check.  She reports dysuria and urinary freq.  No hematuria at present.  The following portions of the patient's history were reviewed and updated as appropriate: allergies, current medications, past family history, past medical history, past social history, past surgical history and problem list.  Review of Systems:  Pertinent items are noted in HPI.  Objective:  Physical Exam Blood pressure 117/62, pulse 101, temperature 98.4 F (36.9 C), weight 268 lb 14.4 oz (121.972 kg), last menstrual period 02/21/2015. Gen: NAD Abd: Soft, mild suprapubic tenderness; nondistended; port sites healing well Pelvic: Normal appearing external genitalia; normal appearing vaginal mucosa and cervix.  Normal discharge.  Small uterus, no other palpable masses, no uterine or adnexal tenderness  Labs and Imaging 04/12/2015  Diagnosis Uterus and bilateral fallopian tubes, cervix - PROLIFERATIVE PATTERN ENDOMETRIUM WITH BENIGN ENDOMETRIAL-TYPE POLYP FORMATION. - UNDERLYING MYOMETRIUM DEMONSTRATES LEIOMYOMA FORMATION. - BENIGN CERVIX. - BENIGN BILATERAL FALLOPIAN TUBES. - NO DYSPLASIA, ATYPIA, HYPERPLASIA, OR MALIGNANCY IDENTIFIED Assessment & Plan:  2 weeks post op check doing well UTI  Bactrim DS bid x 5 days Urine cx F/u in 4 week.  Has appt scheduled  Harveer Sadler L. Harraway-Smith, M.D., [name]

## 2015-04-27 NOTE — Patient Instructions (Signed)

## 2015-04-28 LAB — URINE CULTURE

## 2015-05-06 ENCOUNTER — Encounter (HOSPITAL_COMMUNITY): Payer: Self-pay | Admitting: Emergency Medicine

## 2015-05-06 ENCOUNTER — Ambulatory Visit (HOSPITAL_COMMUNITY)
Admission: RE | Admit: 2015-05-06 | Discharge: 2015-05-06 | Disposition: A | Discharge status: Home / Self Care | Payer: Medicare Other | Source: Ambulatory Visit | Attending: Family Medicine | Admitting: Family Medicine

## 2015-05-06 ENCOUNTER — Encounter: Payer: Self-pay | Admitting: Family Medicine

## 2015-05-06 ENCOUNTER — Other Ambulatory Visit: Payer: Self-pay

## 2015-05-06 ENCOUNTER — Emergency Department (HOSPITAL_COMMUNITY)
Admission: EM | Admit: 2015-05-06 | Discharge: 2015-05-06 | Disposition: A | Discharge status: Home / Self Care | Payer: Medicare Other | Attending: Emergency Medicine | Admitting: Emergency Medicine

## 2015-05-06 ENCOUNTER — Ambulatory Visit (INDEPENDENT_AMBULATORY_CARE_PROVIDER_SITE_OTHER): Payer: Medicare Other | Admitting: Family Medicine

## 2015-05-06 ENCOUNTER — Emergency Department (HOSPITAL_COMMUNITY): Payer: Medicare Other

## 2015-05-06 VITALS — BP 135/80 | HR 77 | Temp 98.5°F | Ht 64.0 in | Wt 267.1 lb

## 2015-05-06 DIAGNOSIS — D649 Anemia, unspecified: Secondary | ICD-10-CM | POA: Diagnosis not present

## 2015-05-06 DIAGNOSIS — Z79899 Other long term (current) drug therapy: Secondary | ICD-10-CM | POA: Insufficient documentation

## 2015-05-06 DIAGNOSIS — R079 Chest pain, unspecified: Secondary | ICD-10-CM | POA: Insufficient documentation

## 2015-05-06 DIAGNOSIS — D509 Iron deficiency anemia, unspecified: Secondary | ICD-10-CM | POA: Diagnosis not present

## 2015-05-06 DIAGNOSIS — Z792 Long term (current) use of antibiotics: Secondary | ICD-10-CM | POA: Insufficient documentation

## 2015-05-06 DIAGNOSIS — R0602 Shortness of breath: Secondary | ICD-10-CM | POA: Diagnosis not present

## 2015-05-06 DIAGNOSIS — Z8719 Personal history of other diseases of the digestive system: Secondary | ICD-10-CM | POA: Insufficient documentation

## 2015-05-06 DIAGNOSIS — R0789 Other chest pain: Secondary | ICD-10-CM

## 2015-05-06 DIAGNOSIS — J45909 Unspecified asthma, uncomplicated: Secondary | ICD-10-CM | POA: Diagnosis not present

## 2015-05-06 DIAGNOSIS — Z720 Tobacco use: Secondary | ICD-10-CM | POA: Insufficient documentation

## 2015-05-06 DIAGNOSIS — R05 Cough: Secondary | ICD-10-CM | POA: Diagnosis not present

## 2015-05-06 HISTORY — DX: Esophagitis, unspecified without bleeding: K20.90

## 2015-05-06 HISTORY — DX: Gastro-esophageal reflux disease without esophagitis: K21.9

## 2015-05-06 HISTORY — DX: Unspecified asthma, uncomplicated: J45.909

## 2015-05-06 HISTORY — DX: Esophagitis, unspecified: K20.9

## 2015-05-06 LAB — CBC
HEMATOCRIT: 29.9 % — AB (ref 36.0–46.0)
HEMOGLOBIN: 8.9 g/dL — AB (ref 12.0–15.0)
MCH: 22.9 pg — AB (ref 26.0–34.0)
MCHC: 29.8 g/dL — ABNORMAL LOW (ref 30.0–36.0)
MCV: 77.1 fL — AB (ref 78.0–100.0)
Platelets: 517 10*3/uL — ABNORMAL HIGH (ref 150–400)
RBC: 3.88 MIL/uL (ref 3.87–5.11)
RDW: 17.5 % — ABNORMAL HIGH (ref 11.5–15.5)
WBC: 6 10*3/uL (ref 4.0–10.5)

## 2015-05-06 LAB — BASIC METABOLIC PANEL
Anion gap: 7 (ref 5–15)
BUN: 9 mg/dL (ref 6–20)
CHLORIDE: 106 mmol/L (ref 101–111)
CO2: 24 mmol/L (ref 22–32)
CREATININE: 0.83 mg/dL (ref 0.44–1.00)
Calcium: 9.7 mg/dL (ref 8.9–10.3)
GFR calc non Af Amer: >60 mL/min (ref >60)
Glucose, Bld: 94 mg/dL (ref 65–99)
POTASSIUM: 4.1 mmol/L (ref 3.5–5.1)
SODIUM: 137 mmol/L (ref 135–145)

## 2015-05-06 LAB — I-STAT TROPONIN, ED: Troponin i, poc: 0 ng/mL (ref 0.00–0.08)

## 2015-05-06 LAB — D-DIMER, QUANTITATIVE: D-Dimer, Quant: 0.58 ug/mL-FEU — ABNORMAL HIGH (ref 0.00–0.48)

## 2015-05-06 MED ORDER — NITROGLYCERIN 0.4 MG SL SUBL
0.4000 mg | SUBLINGUAL_TABLET | Freq: Once | SUBLINGUAL | Status: AC
  Administered 2015-05-06: 0.4 mg via SUBLINGUAL

## 2015-05-06 MED ORDER — KETOROLAC TROMETHAMINE 60 MG/2ML IM SOLN
60.0000 mg | Freq: Once | INTRAMUSCULAR | Status: DC
  Filled 2015-05-06: qty 2

## 2015-05-06 MED ORDER — GI COCKTAIL ~~LOC~~
30.0000 mL | Freq: Once | ORAL | Status: AC
  Administered 2015-05-06: 30 mL via ORAL
  Filled 2015-05-06: qty 30

## 2015-05-06 MED ORDER — IOHEXOL 350 MG/ML SOLN
80.0000 mL | Freq: Once | INTRAVENOUS | Status: AC | PRN
  Administered 2015-05-06: 90 mL via INTRAVENOUS

## 2015-05-06 MED ORDER — ASPIRIN 325 MG PO TABS
325.0000 mg | ORAL_TABLET | Freq: Once | ORAL | Status: AC
  Administered 2015-05-06: 325 mg via ORAL

## 2015-05-06 MED ORDER — KETOROLAC TROMETHAMINE 30 MG/ML IJ SOLN
30.0000 mg | Freq: Once | INTRAMUSCULAR | Status: AC
  Administered 2015-05-06: 30 mg via INTRAVENOUS
  Filled 2015-05-06: qty 1

## 2015-05-06 NOTE — ED Notes (Signed)
Dr Laneta Simmers advised toradol can be changed from 60mg  IM to 30mg  IV since patient has IV access

## 2015-05-06 NOTE — Assessment & Plan Note (Signed)
49 year old female presents with one week history of pleuritic chest pain. While scores 4.5. EKG nonischemic. Clinical concern for possible PE. Patient was transferred to the emergency room for further workup and evaluation.

## 2015-05-06 NOTE — ED Notes (Addendum)
from [facility]: [facility] non radiating chest pain with sob for about a week. Vital signs stable.  hysterectomy august 16th was given 324 asa, and 1 ntg brought down pain from 8/10 to 6/10.  Hx of GERD, esophagititis, and asthma.  12 lead unremarkable with EMS.  Chronic etoh and tobacco abuse/use.  Room air 100% during triage at this facility.  Lung sounds clear bilaterally.

## 2015-05-06 NOTE — ED Notes (Signed)
Patient transported to X-ray 

## 2015-05-06 NOTE — ED Provider Notes (Signed)
CSN: [id number]     Arrival date & time 05/06/15  1033 History   First MD Initiated Contact with Patient 05/06/15 1040     Chief Complaint  Patient presents with  . Chest Pain     (Consider location/radiation/quality/duration/timing/severity/associated sxs/prior Treatment) Patient is a 49 y.o. female presenting with chest pain. The history is provided by the patient.  Chest Pain Pain location:  Substernal area Pain quality: sharp   Pain radiates to:  Does not radiate Pain radiates to the back: no   Pain severity:  Moderate Onset quality:  Gradual Duration:  1 week Timing:  Constant Progression:  Unchanged Chronicity:  New Relieved by:  Nothing Worsened by:  Nothing tried Ineffective treatments:  None tried Associated symptoms: cough ("a month or two")   Risk factors: obesity, smoking and surgery (hysterectomy 3+ wks/ago)   Risk factors: not pregnant and no prior DVT/PE     Past Medical History  Diagnosis Date  . Anemia   . Asthma   . GERD (gastroesophageal reflux disease)   . Esophagitis    Past Surgical History  Procedure Laterality Date  . Cesarean section    . Tubal ligation    . Tibia fracture surgery    . Ankle surgery Left   . Finger surgery    . Cystoscopy N/A 04/12/2015    Procedure: CYSTOSCOPY;  Surgeon: Lavonia Drafts, MD;  Location: [facility];  Service: Gynecology;  Laterality: N/A;  . Abdominal hysterectomy  04/12/15   Family History  Problem Relation Age of Onset  . Diabetes Mother   . Heart disease Mother   . Cancer Father 45    lung   Social History  Substance Use Topics  . Smoking status: Current Every Day Smoker -- 1.50 packs/day    Types: Cigarettes    Start date: 08/27/2002  . Smokeless tobacco: Never Used     Comment: cut back to 4-5 cigs per day from 1ppd  . Alcohol Use: 0.0 oz/week     Comment: 2 -40oz daily   OB History    Gravida Para Term Preterm AB TAB SAB Ectopic Multiple Living   3 3 3  0 0 0 0 0 0 3     Review of  Systems  Respiratory: Positive for cough ("a month or two").   Cardiovascular: Positive for chest pain.  All other systems reviewed and are negative.     Allergies  Review of patient's allergies indicates no known allergies.  Home Medications   Prior to Admission medications   Medication Sig Start Date End Date Taking? Authorizing Provider  albuterol (PROVENTIL HFA;VENTOLIN HFA) 108 (90 BASE) MCG/ACT inhaler Inhale 2 puffs into the lungs every 4 (four) hours as needed for wheezing or shortness of breath.   Yes Historical Provider, MD  alum & mag hydroxide-simeth (MAALOX/MYLANTA) 200-200-20 MG/5ML suspension Take 30 mLs by mouth every 6 (six) hours as needed for indigestion or heartburn.   Yes Historical Provider, MD  docusate sodium (COLACE) 100 MG capsule Take 1 capsule (100 mg total) by mouth 2 (two) times daily. 04/13/15  Yes Lavonia Drafts, MD  gabapentin (NEURONTIN) 100 MG capsule Take 1 capsule (100 mg total) by mouth 3 (three) times daily. 01/10/15  Yes Elberta Leatherwood, MD  sulfamethoxazole-trimethoprim (BACTRIM DS,SEPTRA DS) 800-160 MG per tablet Take 1 tablet by mouth 2 (two) times daily. 04/27/15  Yes Lavonia Drafts, MD  traMADol (ULTRAM) 50 MG tablet Take 50 mg by mouth every 6 (six) hours as needed for moderate  pain.   Yes Historical Provider, MD  ferrous sulfate 220 (44 FE) MG/5ML solution Take 5 mLs (220 mg total) by mouth 3 (three) times daily with meals. Patient not taking: Reported on 04/27/2015 01/12/15 01/12/16  Sharon [address], MD  oxyCODONE-acetaminophen (PERCOCET/ROXICET) 5-325 MG per tablet Take 1 tablet by mouth every 6 (six) hours as needed. pain 04/13/15   Historical Provider, MD   BP 134/71 mmHg  Pulse 80  Temp(Src) 97.6 F (36.4 C) (Oral)  Resp 19  Ht 5\' 4"  (1.626 m)  Wt 267 lb (121.11 kg)  BMI 45.81 kg/m2  SpO2 100%  LMP 02/21/2015 (Approximate) Physical Exam  Constitutional: She is oriented to person, place, and time. She appears  well-developed and well-nourished. No distress.  HENT:  Head: Normocephalic.  Eyes: Conjunctivae are normal.  Neck: Neck supple. No tracheal deviation present.  Cardiovascular: Normal rate and regular rhythm.   Pulmonary/Chest: Effort normal. No accessory muscle usage. No respiratory distress. She has no decreased breath sounds. She has no wheezes. She has no rhonchi. She has no rales. She exhibits tenderness (mid-sternal, point tender).  Abdominal: Soft. She exhibits no distension.  Neurological: She is alert and oriented to person, place, and time.  Skin: Skin is warm and dry.  Psychiatric: She has a normal mood and affect.    ED Course  Procedures (including critical care time) Labs Review Labs Reviewed  CBC - Abnormal; Notable for the following:    Hemoglobin 8.9 (*)    HCT 29.9 (*)    MCV 77.1 (*)    MCH 22.9 (*)    MCHC 29.8 (*)    RDW 17.5 (*)    Platelets 517 (*)    All other components within normal limits  D-DIMER, QUANTITATIVE (NOT AT [facility]) - Abnormal; Notable for the following:    D-Dimer, Quant 0.58 (*)    All other components within normal limits  BASIC METABOLIC PANEL  I-STAT TROPOININ, ED    Imaging Review Dg Chest 2 View  05/06/2015   CLINICAL DATA:  Chest pain with Short of breath  EXAM: CHEST  2 VIEW  COMPARISON:  03/09/2014  FINDINGS: The heart size and mediastinal contours are within normal limits. Both lungs are clear. The visualized skeletal structures are unremarkable.  IMPRESSION: No active cardiopulmonary disease.   Electronically Signed   By: Franchot Gallo M.D.   On: 05/06/2015 11:14   Ct Angio Chest Pe W/cm &/or Wo Cm  05/06/2015   CLINICAL DATA:  49 year old female with 1 week history of shortness of breath and sharp, central chest pain  EXAM: CT ANGIOGRAPHY CHEST WITH CONTRAST  TECHNIQUE: Multidetector CT imaging of the chest was performed using the standard protocol during bolus administration of intravenous contrast. Multiplanar CT image  reconstructions and MIPs were obtained to evaluate the vascular anatomy.  CONTRAST:  52mL OMNIPAQUE IOHEXOL 350 MG/ML SOLN  COMPARISON:  Chest x-ray 05/06/2015 ; prior CT abdomen/ pelvis 08/09/2003  FINDINGS: Mediastinum: Unremarkable CT appearance of the thyroid gland. No suspicious mediastinal or hilar adenopathy. No soft tissue mediastinal mass. The thoracic esophagus is unremarkable.  Heart/Vascular: Adequate opacification of the pulmonary arteries to the proximal subsegmental level. No evidence of central filling defect to suggest acute pulmonary embolus. The heart is at the upper limits of normal for size. No pericardial effusion. Suspect trace calcification along the course of the left anterior descending coronary artery. Two vessel arch anatomy. The right brachiocephalic and left common carotid artery share a common origin. No aneurysm.  Lungs/Pleura: Negative for pleural effusion.  The lungs are clear.  Bones/Soft Tissues: No acute fracture or aggressive appearing lytic or blastic osseous lesion.  Upper Abdomen: 1.4 cm circumscribed hypoechoic structure in the medial segment of the left hepatic lobe. Smaller, 1.0 cm low-attenuation lesion in the periphery of the right hepatic lobe. This second smaller lesion is essentially unchanged compared to prior CT imaging from 2004. However, the slightly larger lesion in the medial left lobe was not visible previously. Otherwise, unremarkable visualized upper abdomen.  Review of the MIP images confirms the above findings.  IMPRESSION: 1. Negative for acute pulmonary embolus, pneumonia or other acute cardiopulmonary process. 2. Incidental note is made of a nonspecific 1.4 cm hypoechoic structure in the medial segment of the left hepatic lobe. Differential considerations include hepatic cyst, hemangioma, and less likely a primary or neoplastic malignant lesion. Consider further evaluation with CT scan of the abdomen with intravenous contrast. 3. Trace calcification  along the course of the left anterior descending coronary artery. Please note that although the presence of coronary artery calcium documents the presence of coronary artery disease, the severity of this disease and any potential stenosis cannot be assessed on this non-gated CT examination. Assessment for potential risk factor modification, dietary therapy or pharmacologic therapy may be warranted, if clinically indicated.   Electronically Signed   By: Jacqulynn Cadet M.D.   On: 05/06/2015 14:54   I have personally reviewed and evaluated these images and lab results as part of my medical decision-making.   EKG Interpretation   Date/Time:  Friday May 06 2015 10:36:36 EDT Ventricular Rate:  63 PR Interval:  179 QRS Duration: 76 QT Interval:  421 QTC Calculation: 431 R Axis:   63 Text Interpretation:  Sinus rhythm ST elev, probable normal early repol  pattern ED PHYSICIAN INTERPRETATION AVAILABLE IN [facility] Confirmed  by TEST, Record ([id number]) on 05/07/2015 9:31:45 AM      MDM   Final diagnoses:  Chest wall pain    49 y.o. female presents with sternal chest pain worse with breathing. HEART score 2, atypical for ACS, trop neagive. Sent from PCP for evaluation for PE d/t recent surgery. No other s/s of DVT. Pt low risk by Wells of 1.5, not PERC negative. Will perfom d-dimer for r/o.  Dimer is positive, CT PE ordered and is negative. Pt informed of incidental findings. Likely MSK pain, supportive care measures recommended. Plan to follow up with PCP as needed and return precautions discussed for worsening or new concerning symptoms.    Leo Grosser, MD 05/07/15 [phone]

## 2015-05-06 NOTE — Progress Notes (Signed)
   Subjective:    Patient ID: [name], female    DOB: [birth year], 49 y.o.   MRN: [id number]  HPI 49 year old African-American female presents to request sickle cell testing.  Patient has history of iron deficiency anemia, patient recently had a hysterectomy performed on 04/12/2015 for history of uterine fibroids, patient also reports a history of peptic ulcer disease. Patient states that as requested by outside nursing staff the patient be tested for sickle cell disease as her son has sickle cell trait.  Acute chest pain - during the patient's evaluation and was identified that she has acute chest pain, sharp in nature, substernal, no radiation, present for the past week, associated nausea and shortness of breath, patient does report pleurisy, no calf pain, no lower extremity swelling, she does report epigastric abdominal pain which is consistent with her previous peptic ulcer disease, no previous history of CAD or MI, no previous blood clot   Review of Systems  Constitutional: Negative for fever, chills and fatigue.  Respiratory: Positive for chest tightness and shortness of breath.   Cardiovascular: Positive for chest pain. Negative for palpitations and leg swelling.  Gastrointestinal: Positive for abdominal pain. Negative for nausea, vomiting and diarrhea.       Objective:   Physical Exam Vitals: reivewed, Sp02 of 88% on 2L [place].  Gen.: Pleasant female, appears in distress secondary to her chest pain Cardiac: Regular rate and rhythm, S1 and S2 present, no murmurs, no heaves or thrills Respiratory: Clear to auscultation bilaterally, normal effort Exam is: No edema, no calf tenderness, no calf swelling  EKG - normal sinus rhythm without acute evidence of ischemia     Assessment & Plan:  Please see problem specific assessment and plan.  Clinically I'm concerned for possible PE. Well score is 4.5. Patient was was given 325 mg of aspirin and nitroglycerin 1. She was started on  oxygen. EMS was contacted for emergent transportation for the ED.

## 2015-05-06 NOTE — Discharge Instructions (Signed)
YOU HAVE AN INCIDENTAL FINDING OF A NODULE ON YOUR LIVER WHICH WILL REQUIRE FOLLOW UP IMAGING NON-EMERGENTLY WITH YOUR PRIMARY CARE PHYSICIAN.   Chest Wall Pain Chest wall pain is pain in or around the bones and muscles of your chest. It may take up to 6 weeks to get better. It may take longer if you must stay physically active in your work and activities.  CAUSES  Chest wall pain may happen on its own. However, it may be caused by:  A viral illness like the flu.  Injury.  Coughing.  Exercise.  Arthritis.  Fibromyalgia.  Shingles. HOME CARE INSTRUCTIONS   Avoid overtiring physical activity. Try not to strain or perform activities that cause pain. This includes any activities using your chest or your abdominal and side muscles, especially if heavy weights are used.  Put ice on the sore area.  Put ice in a plastic bag.  Place a towel between your skin and the bag.  Leave the ice on for 15-20 minutes per hour while awake for the first 2 days.  Only take over-the-counter or prescription medicines for pain, discomfort, or fever as directed by your caregiver. SEEK IMMEDIATE MEDICAL CARE IF:   Your pain increases, or you are very uncomfortable.  You have a fever.  Your chest pain becomes worse.  You have new, unexplained symptoms.  You have nausea or vomiting.  You feel sweaty or lightheaded.  You have a cough with phlegm (sputum), or you cough up blood. MAKE SURE YOU:   Understand these instructions.  Will watch your condition.  Will get help right away if you are not doing well or get worse. Document Released: 08/13/2005 Document Revised: 11/05/2011 Document Reviewed: 04/09/2011 [facility] Patient Information 2015 [place], [place]. This information is not intended to replace advice given to you by your health care provider. Make sure you discuss any questions you have with your health care provider.

## 2015-05-06 NOTE — Assessment & Plan Note (Signed)
Patient presents to discuss possible sickle cell workup. She has a long-standing history of iron deficiency anemia. She was counseled that she may need sickle cell testing as her son has sickle cell trait. Patient was found to be in acute chest pain therefore this issue was not fully worked up. Patient was sent by EMS to the ED for further workup of her acute chest pain. Patient will need further workup at future visits. Based on her history it appears that her iron deficiency anemia may be secondary to history of fibroids and peptic ulcer disease.

## 2015-05-06 NOTE — ED Notes (Signed)
Patient transported to CT 

## 2015-05-25 ENCOUNTER — Encounter: Payer: Self-pay | Admitting: Obstetrics & Gynecology

## 2015-05-25 ENCOUNTER — Ambulatory Visit: Payer: Medicare Other | Admitting: Obstetrics & Gynecology

## 2015-05-25 VITALS — BP 129/75 | HR 94 | Temp 98.4°F | Ht 64.0 in | Wt 268.3 lb

## 2015-05-25 DIAGNOSIS — Z9889 Other specified postprocedural states: Secondary | ICD-10-CM

## 2015-05-25 NOTE — Progress Notes (Signed)
Patient ID: LAKELA KUBA, female   DOB: [birth year], 49 y.o.   MRN: [id number] History:  49 y.o. G3P3003 here today for 6 weeks post op check.  She denies problems.  She is back to full activities but, is not sexually.  She has resumed smoking tob.    The following portions of the patient's history were reviewed and updated as appropriate: allergies, current medications, past family history, past medical history, past social history, past surgical history and problem list.  Review of Systems:  Pertinent items are noted in HPI.  Objective:  Physical Exam Blood pressure 129/75, pulse 94, temperature 98.4 F (36.9 C), temperature source Oral, height 5\' 4"  (1.626 m), weight 268 lb 4.8 oz (121.7 kg), last menstrual period 02/21/2015. Gen: NAD Abd: Soft, nontender and nondistended; obese Pelvic: Normal appearing external genitalia; normal appearing vaginal mucosa. Normal discharge. Sutures still noted at the cuff.  No adnexal tenderness  Labs and Imaging 03/2015 Diagnosis Uterus and bilateral fallopian tubes, cervix - PROLIFERATIVE PATTERN ENDOMETRIUM WITH BENIGN ENDOMETRIAL-TYPE POLYP FORMATION. - UNDERLYING MYOMETRIUM DEMONSTRATES LEIOMYOMA FORMATION. - BENIGN CERVIX. - BENIGN BILATERAL FALLOPIAN TUBES. - NO DYSPLASIA, ATYPIA, HYPERPLASIA, OR MALIGNANCY IDENTIFIED.  Assessment & Plan:  6 weeks post op check.  Doing well F/u in 3-4 months or sooner prn Rec tobacco cessation May resume sexual activity in 2 weeks  Carolyn L. Harraway-Smith, M.D., [name]

## 2015-05-25 NOTE — Patient Instructions (Signed)
Total Laparoscopic Hysterectomy, Care After °Refer to this sheet in the next few weeks. These instructions provide you with information on caring for yourself after your procedure. Your health care provider may also give you more specific instructions. Your treatment has been planned according to current medical practices, but problems sometimes occur. Call your health care provider if you have any problems or questions after your procedure. °WHAT TO EXPECT AFTER THE PROCEDURE °· Pain and bruising at the incision sites. You will be given pain medicine to control it. °· Menopausal symptoms such as hot flashes, night sweats, and insomnia if your ovaries were removed. °· Sore throat from the breathing tube that was inserted during surgery. °HOME CARE INSTRUCTIONS °· Only take over-the-counter or prescription medicines for pain, discomfort, or fever as directed by your health care provider.   °· Do not take aspirin. It can cause bleeding.   °· Do not drive when taking pain medicine.   °· Follow your health care provider's advice regarding diet, exercise, lifting, driving, and general activities.   °· Resume your usual diet as directed and allowed.   °· Get plenty of rest and sleep.   °· Do not douche, use tampons, or have sexual intercourse for at least 6 weeks, or until your health care provider gives you permission.   °· Change your bandages (dressings) as directed by your health care provider.   °· Monitor your temperature and notify your health care provider of a fever.   °· Take showers instead of baths for 2-3 weeks.   °· Do not drink alcohol until your health care provider gives you permission.   °· If you develop constipation, you may take a mild laxative with your health care provider's permission. Bran foods may help with constipation problems. Drinking enough fluids to keep your urine clear or pale yellow may help as well.   °· Try to have someone home with you for 1-2 weeks to help around the house.    °· Keep all of your follow-up appointments as directed by your health care provider.   °SEEK MEDICAL CARE IF: °· You have swelling, redness, or increasing pain around your incision sites.   °· You have pus coming from your incision.   °· You notice a bad smell coming from your incision.   °· Your incision breaks open.   °· You feel dizzy or lightheaded.   °· You have pain or bleeding when you urinate.   °· You have persistent diarrhea.   °· You have persistent nausea and vomiting.   °· You have abnormal vaginal discharge.   °· You have a rash.   °· You have any type of abnormal reaction or develop an allergy to your medicine.   °· You have poor pain control with your prescribed medicine.   °SEEK IMMEDIATE MEDICAL CARE IF: °· You have chest pain or shortness of breath. °· You have severe abdominal pain that is not relieved with pain medicine. °· You have pain or swelling in your legs. °MAKE SURE YOU: °· Understand these instructions. °· Will watch your condition. °· Will get help right away if you are not doing well or get worse. °Document Released: 06/03/2013 Document Revised: 08/18/2013 Document Reviewed: 06/03/2013 °[employer]® Patient Information ©2015 [employer]. This information is not intended to replace advice given to you by your health care provider. Make sure you discuss any questions you have with your health care provider. ° °

## 2015-06-16 ENCOUNTER — Ambulatory Visit: Payer: Self-pay | Admitting: Family Medicine

## 2015-07-07 ENCOUNTER — Ambulatory Visit (INDEPENDENT_AMBULATORY_CARE_PROVIDER_SITE_OTHER): Payer: Medicare Other | Admitting: Family Medicine

## 2015-07-07 ENCOUNTER — Encounter: Payer: Self-pay | Admitting: Family Medicine

## 2015-07-07 VITALS — BP 134/69 | HR 87 | Temp 98.1°F | Ht 64.0 in | Wt 276.0 lb

## 2015-07-07 DIAGNOSIS — K7689 Other specified diseases of liver: Secondary | ICD-10-CM

## 2015-07-07 DIAGNOSIS — D1803 Hemangioma of intra-abdominal structures: Secondary | ICD-10-CM | POA: Insufficient documentation

## 2015-07-07 DIAGNOSIS — K769 Liver disease, unspecified: Secondary | ICD-10-CM

## 2015-07-07 NOTE — Patient Instructions (Signed)
It was a pleasure seeing you today in our clinic. Today we discussed your CT scan. Here is the treatment plan we have discussed and agreed upon together:   - At this time I think it is appropriate to obtain more imaging of this structure. Our staff will help set this appointment up for you. I would like to see you in 2-4 weeks to discuss these results.

## 2015-07-07 NOTE — Progress Notes (Signed)
   HPI  CC: Hospital follow-up Patient is here for a follow-up on her CTA of her chest. Patient was seen in the ED with complaints of chest pain on 05/06/15. At that time she had a positive d-dimer then prompted a CTA of her chest to be performed to rule out PE. No PE was noted however a incidental finding was noted on the left lobe of the liver. This lesion measured approximately 1.4 cm. Patient was told to follow-up with her PCP for this issue. Patient reports some slight right upper quadrant pain. She does not know whether this is due to her recent hysterectomy. Patient denies any anorexia, weight loss, weight gain, night sweats, fevers, chills, shortness of breath, chest pain, nausea, vomiting, diarrhea.  ROS: As above Past medical history and social history reviewed and updated in the EMR as appropriate.  Objective: BP 134/69 mmHg  Pulse 87  Temp(Src) 98.1 F (36.7 C) (Oral)  Ht 5\' 4"  (1.626 m)  Wt 276 lb (125.193 kg)  BMI 47.35 kg/m2  LMP 02/21/2015 (Approximate) Gen: NAD, alert, cooperative, and pleasant. HEENT: NCAT, EOMI CV: RRr Resp: CTAB Abd: Morbidly obese, S, ND, some slight right upper quadrant tenderness, well-healing relatively recent surgical scars present, BS present, no guarding or organomegaly Ext: No edema, warm Neuro: Alert and oriented, Speech clear, No gross deficits  Assessment and plan:  Liver lesion, left lobe Patient is here for a follow-up to go over her incidental liver lesion finding from a CTA taken on 05/06/15. At this time I believe it is appropriate to further image this site. Patient is in agreement. - CT with contrast of patient's abdomen/pelvis ordered. - Patient was asked to make a follow-up appointment to go over these results once the CT is been taken.    Orders Placed This Encounter  Procedures  . CT Abdomen Pelvis W Contrast    276LBS/NO NEEDS/NOT DIABETIC/NO ALLERGIES/NO LABS NEEDED/INS/[employer] MEDICARE/HB & ASHA    Standing Status: Future     Number of Occurrences:      Standing Expiration Date: 10/06/2016    Order Specific Question:  If indicated for the ordered procedure, I authorize the administration of contrast media per Radiology protocol    Answer:  Yes    Order Specific Question:  Reason for Exam (SYMPTOM  OR DIAGNOSIS REQUIRED)    Answer:  1.4cm left lobe structure    Order Specific Question:  Is the patient pregnant?    Answer:  No    Order Specific Question:  Preferred imaging location?    Answer:  [facility]    Elberta Leatherwood, MD,MS,  PGY2 07/07/2015 6:11 PM

## 2015-07-07 NOTE — Assessment & Plan Note (Signed)
Patient is here for a follow-up to go over her incidental liver lesion finding from a CTA taken on 05/06/15. At this time I believe it is appropriate to further image this site. Patient is in agreement. - CT with contrast of patient's abdomen/pelvis ordered. - Patient was asked to make a follow-up appointment to go over these results once the CT is been taken.

## 2015-07-13 ENCOUNTER — Ambulatory Visit: Payer: Self-pay

## 2015-07-14 ENCOUNTER — Other Ambulatory Visit: Payer: Self-pay

## 2015-07-19 ENCOUNTER — Ambulatory Visit
Admission: RE | Admit: 2015-07-19 | Discharge: 2015-07-19 | Disposition: A | Discharge status: Home / Self Care | Payer: Medicare Other | Source: Ambulatory Visit | Attending: Family Medicine | Admitting: Family Medicine

## 2015-07-19 DIAGNOSIS — K769 Liver disease, unspecified: Secondary | ICD-10-CM

## 2015-07-19 MED ORDER — IOPAMIDOL (ISOVUE-300) INJECTION 61%
125.0000 mL | Freq: Once | INTRAVENOUS | Status: AC | PRN
  Administered 2015-07-19: 125 mL via INTRAVENOUS

## 2015-07-28 ENCOUNTER — Ambulatory Visit: Payer: Self-pay

## 2015-08-08 ENCOUNTER — Telehealth: Payer: Self-pay | Admitting: Family Medicine

## 2015-08-08 NOTE — Telephone Encounter (Signed)
Pt called and would like a new breathing Pump called in to [facility] on [address]. jw

## 2015-08-09 ENCOUNTER — Other Ambulatory Visit: Payer: Self-pay | Admitting: Family Medicine

## 2015-08-09 MED ORDER — ALBUTEROL SULFATE HFA 108 (90 BASE) MCG/ACT IN AERS: 2.0000 | INHALATION_SPRAY | RESPIRATORY_TRACT | Status: DC | PRN

## 2015-08-09 NOTE — Telephone Encounter (Signed)
Inhaler order sent.

## 2015-08-17 ENCOUNTER — Encounter: Payer: Self-pay | Admitting: Family Medicine

## 2015-08-17 ENCOUNTER — Ambulatory Visit (INDEPENDENT_AMBULATORY_CARE_PROVIDER_SITE_OTHER): Payer: Medicare Other | Admitting: Family Medicine

## 2015-08-17 VITALS — BP 117/55 | HR 88 | Temp 98.1°F | Ht 64.0 in | Wt 281.0 lb

## 2015-08-17 DIAGNOSIS — D509 Iron deficiency anemia, unspecified: Secondary | ICD-10-CM | POA: Diagnosis not present

## 2015-08-17 DIAGNOSIS — D500 Iron deficiency anemia secondary to blood loss (chronic): Secondary | ICD-10-CM

## 2015-08-17 DIAGNOSIS — D1803 Hemangioma of intra-abdominal structures: Secondary | ICD-10-CM | POA: Diagnosis not present

## 2015-08-17 LAB — CBC
HCT: 35.9 % — ABNORMAL LOW (ref 36.0–46.0)
Hemoglobin: 10.9 g/dL — ABNORMAL LOW (ref 12.0–15.0)
MCH: 24.1 pg — ABNORMAL LOW (ref 26.0–34.0)
MCHC: 30.4 g/dL (ref 30.0–36.0)
MCV: 79.2 fL (ref 78.0–100.0)
MPV: 9.9 fL (ref 8.6–12.4)
Platelets: 351 10*3/uL (ref 150–400)
RBC: 4.53 MIL/uL (ref 3.87–5.11)
RDW: 18.9 % — AB (ref 11.5–15.5)
WBC: 7.7 10*3/uL (ref 4.0–10.5)

## 2015-08-17 NOTE — Assessment & Plan Note (Signed)
Patient has a history of iron deficiency anemia likely secondary to dysfunctional uterine bleeding. Patient underwent a hysterectomy approximately 3 months ago. No follow-up CBC has been obtained since that surgery. - CBC obtained today in clinic - If it appears as though patient has persistent iron deficiency anemia then I've informed patient that I will contact her with possible diet modifications she can make to increase her iron intake.

## 2015-08-17 NOTE — Progress Notes (Signed)
   HPI  CC: Imaging follow-up. Patient is here for follow-up on her recent CT scan. She denies discussed the results of a benign finding, hepatic hemangioma, as well as the effects of this lesion. Patient was relieved to hear this news. She asked many appropriate questions.  Patient and I also spent time to discuss her current medications. She states that she has been unable to take any iron supplementation due to nausea and occasional vomiting from these treatments. Patient has a history of iron deficiency anemia however she recently underwent a hysterectomy for dysfunctional uterine bleeding about 3 months ago. She reports no significant complaints or issues since his surgery.  ROS: Patient denies any headache, dizziness, lightheadedness, diaphoresis, fever, chills, weight loss, weight gain, nausea, vomiting, diarrhea, shortness of breath, chest pain, dysuria, fecal/urinary incontinence, numbness, weakness, or paresthesias.  Past medical history and social history reviewed and updated in the EMR as appropriate.  Objective: BP 117/55 mmHg  Pulse 88  Temp(Src) 98.1 F (36.7 C) (Oral)  Ht 5\' 4"  (1.626 m)  Wt 281 lb (127.461 kg)  BMI 48.21 kg/m2  LMP 02/21/2015 (Approximate) Gen: NAD, alert, cooperative, and pleasant. CV: RRR, no murmur Resp: CTAB, no wheezes, non-labored   Assessment and plan:  Hemangioma of liver Patient and I discussed the results of the CT obtained on 07/19/15. No further imaging necessary at this time.  Iron deficiency anemia Patient has a history of iron deficiency anemia likely secondary to dysfunctional uterine bleeding. Patient underwent a hysterectomy approximately 3 months ago. No follow-up CBC has been obtained since that surgery. - CBC obtained today in clinic - If it appears as though patient has persistent iron deficiency anemia then I've informed patient that I will contact her with possible diet modifications she can make to increase her iron  intake.    Orders Placed This Encounter  Procedures  . CBC     Elberta Leatherwood, MD,MS,  PGY2 08/17/2015 7:07 PM

## 2015-08-17 NOTE — Patient Instructions (Signed)
Iron Deficiency Anemia, Adult Anemia is a condition in which there are less red blood cells or hemoglobin in the blood than normal. Hemoglobin is the part of red blood cells that carries oxygen. Iron deficiency anemia is anemia caused by too little iron. It is the most common type of anemia. It may leave you tired and short of breath. CAUSES   Lack of iron in the diet.  Poor absorption of iron, as seen with intestinal disorders.  Intestinal bleeding.  Heavy periods. SIGNS AND SYMPTOMS  Mild anemia may not be noticeable. Symptoms may include:  Fatigue.  Headache.  Pale skin.  Weakness.  Tiredness.  Shortness of breath.  Dizziness.  Cold hands and feet.  Fast or irregular heartbeat. DIAGNOSIS  Diagnosis requires a thorough evaluation and physical exam by your health care provider. Blood tests are generally used to confirm iron deficiency anemia. Additional tests may be done to find the underlying cause of your anemia. These may include:  Testing for blood in the stool (fecal occult blood test).  A procedure to see inside the colon and rectum (colonoscopy).  A procedure to see inside the esophagus and stomach (endoscopy). TREATMENT  Iron deficiency anemia is treated by correcting the cause of the deficiency. Treatment may involve:  Adding iron-rich foods to your diet.  Taking iron supplements. Pregnant or breastfeeding women need to take extra iron because their normal diet usually does not provide the required amount.  Taking vitamins. Vitamin C improves the absorption of iron. Your health care provider may recommend that you take your iron tablets with a glass of orange juice or vitamin C supplement.  Medicines to make heavy menstrual flow lighter.  Surgery. HOME CARE INSTRUCTIONS   Take iron as directed by your health care provider.  If you cannot tolerate taking iron supplements by mouth, talk to your health care provider about taking them through a vein  (intravenously) or an injection into a muscle.  For the best iron absorption, iron supplements should be taken on an empty stomach. If you cannot tolerate them on an empty stomach, you may need to take them with food.  Do not drink milk or take antacids at the same time as your iron supplements. Milk and antacids may interfere with the absorption of iron.  Iron supplements can cause constipation. Make sure to include fiber in your diet to prevent constipation. A stool softener may also be recommended.  Take vitamins as directed by your health care provider.  Eat a diet rich in iron. Foods high in iron include liver, lean beef, whole-grain bread, eggs, dried fruit, and dark green leafy vegetables. SEEK IMMEDIATE MEDICAL CARE IF:   You faint. If this happens, do not drive. Call your local emergency services (911 in [place] if no other help is available.  You have chest pain.  You feel nauseous or vomit.  You have severe or increased shortness of breath with activity.  You feel weak.  You have a rapid heartbeat.  You have unexplained sweating.  You become light-headed when getting up from a chair or bed. MAKE SURE YOU:   Understand these instructions.  Will watch your condition.  Will get help right away if you are not doing well or get worse.   This information is not intended to replace advice given to you by your health care provider. Make sure you discuss any questions you have with your health care provider.   Document Released: 08/10/2000 Document Revised: 09/03/2014 Document Reviewed: 04/20/2013 Elsevier   Interactive Patient Education 2016 [employer].  

## 2015-08-17 NOTE — Assessment & Plan Note (Signed)
Patient and I discussed the results of the CT obtained on 07/19/15. No further imaging necessary at this time.

## 2015-08-22 ENCOUNTER — Encounter: Payer: Self-pay | Admitting: Family Medicine

## 2015-09-07 ENCOUNTER — Ambulatory Visit: Payer: Medicare Other | Admitting: Sports Medicine

## 2015-09-21 ENCOUNTER — Ambulatory Visit (INDEPENDENT_AMBULATORY_CARE_PROVIDER_SITE_OTHER): Payer: Medicare Other | Admitting: Sports Medicine

## 2015-09-21 ENCOUNTER — Encounter: Payer: Self-pay | Admitting: Sports Medicine

## 2015-09-21 ENCOUNTER — Other Ambulatory Visit: Payer: Self-pay | Admitting: Sports Medicine

## 2015-09-21 VITALS — BP 127/59 | HR 80 | Ht 64.0 in | Wt 281.0 lb

## 2015-09-21 DIAGNOSIS — M19019 Primary osteoarthritis, unspecified shoulder: Secondary | ICD-10-CM

## 2015-09-21 DIAGNOSIS — M25511 Pain in right shoulder: Secondary | ICD-10-CM

## 2015-09-21 DIAGNOSIS — M1712 Unilateral primary osteoarthritis, left knee: Secondary | ICD-10-CM

## 2015-09-21 DIAGNOSIS — M25562 Pain in left knee: Secondary | ICD-10-CM | POA: Diagnosis not present

## 2015-09-21 DIAGNOSIS — M129 Arthropathy, unspecified: Secondary | ICD-10-CM

## 2015-09-21 MED ORDER — METHYLPREDNISOLONE ACETATE 40 MG/ML IJ SUSP
40.0000 mg | Freq: Once | INTRAMUSCULAR | Status: AC
  Administered 2015-09-21: 40 mg via INTRA_ARTICULAR

## 2015-09-21 MED ORDER — TRAMADOL HCL 50 MG PO TABS: ORAL_TABLET | ORAL | Status: DC

## 2015-09-21 NOTE — Patient Instructions (Signed)
Chrys Racer from [facility] will call you for your appt for your injection. Her number is [phone]

## 2015-09-21 NOTE — Progress Notes (Signed)
   Subjective:    Patient ID: Kendra [name], female    DOB: 1965-12-27, 50 y.o.   MRN: [id number]  HPI chief complaint: Right shoulder and left knee pain  Patient comes in today with returning right shoulder and left knee pain. 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 An intra-articular cortisone injection done at [facility] helped her for several months. Her pain has returned. It is identical in nature to what she expressed previously. She would like to have a repeat injection done. She denies any recent trauma. No numbness or tingling. She is also complaining of returning left knee pain. 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 Last set of x-rays were done in 2015. She has done well with intermittent cortisone injections in the past. Pain has returned. Identical in nature to what she expressed previously. She gets stiffness as well as intermittent swelling. No mechanical symptoms. No recent trauma. She takes tramadol as needed for both her shoulder and her knee. It does seem to help.  Interim medical history reviewed Medications reviewed Allergies reviewed    Review of Systems    as above Objective:   Physical Exam  Obese. No acute distress. Awake alert and oriented 3. Vital signs reviewed  Right shoulder: Full passive and active range of motion. She does have some pain but full strength with cuff stressing. Some pain with axial loading of the joint. No significant crepitus. Neurovascularly intact distally.  Left knee: Range of motion 0-100. Tender to palpation along the medial joint line. Pain but no popping with McMurray's. 1+ patellofemoral crepitus. No effusion. Good ligamentous stability. Neurovascularly intact distally. Walking with a slight limp.  MRI of the right shoulder is as above. X-rays of the left knee are as above.      Assessment & Plan:  Returning right shoulder  pain secondary to glenohumeral DJD Returning left knee pain secondary to advanced medial compartmental DJD  Left knee is injected with cortisone today. An anterior lateral approach utilized. Patient tolerated this without difficulty. I will refer her back over to [facility] imaging for a repeat intra-articular glenohumeral injection of the right shoulder. I will refill her tramadol to take as needed. Follow-up in 4 weeks for reevaluation. Patient understands that definitive treatment for her left knee is a total knee arthroplasty but I think we need to continue with conservative treatment for long as possible given her relative young age.  Consent obtained and verified. Time-out conducted. Noted no overlying erythema, induration, or other signs of local infection. Skin prepped in a sterile fashion. Topical analgesic spray: Ethyl chloride. Joint: left knee Needle: 22g 1.5 inch Completed without difficulty. Meds: 3cc 1% xylocaine, 1cc (40mg ) depomedrol  Advised to call if fevers/chills, erythema, induration, drainage, or persistent bleeding.

## 2015-09-29 ENCOUNTER — Ambulatory Visit
Admission: RE | Admit: 2015-09-29 | Discharge: 2015-09-29 | Disposition: A | Discharge status: Home / Self Care | Payer: Medicare Other | Source: Ambulatory Visit | Attending: Sports Medicine | Admitting: Sports Medicine

## 2015-09-29 DIAGNOSIS — M25511 Pain in right shoulder: Secondary | ICD-10-CM

## 2015-09-29 MED ORDER — METHYLPREDNISOLONE ACETATE 40 MG/ML INJ SUSP (RADIOLOG
120.0000 mg | Freq: Once | INTRAMUSCULAR | Status: AC
  Administered 2015-09-29: 120 mg via INTRA_ARTICULAR

## 2015-09-29 MED ORDER — IOHEXOL 180 MG/ML  SOLN
1.0000 mL | Freq: Once | INTRAMUSCULAR | Status: AC | PRN
Start: 2015-09-29 — End: 2015-09-29
  Administered 2015-09-29: 1 mL via INTRA_ARTICULAR

## 2015-10-03 ENCOUNTER — Telehealth: Payer: Self-pay | Admitting: Family Medicine

## 2015-10-03 NOTE — Telephone Encounter (Signed)
Patient will need appt here to assess if cardiology referral is necessary.   If patient is having active chest pain/left arm or jaw pain/shortness of breath/nausea/vomiting/fatigue/sweating then she needs to be evaluated in ED immediately.

## 2015-10-03 NOTE — Telephone Encounter (Signed)
Patient states she is still having chest wall pain. Would like to have additional testing done. Advised patient that she will need to come in for an OV. She would rather see a cardiologist or have additional testing done. Please advise.

## 2015-10-03 NOTE — Telephone Encounter (Signed)
SDA scheduled

## 2015-10-04 ENCOUNTER — Ambulatory Visit (INDEPENDENT_AMBULATORY_CARE_PROVIDER_SITE_OTHER): Payer: Medicare Other | Admitting: Family Medicine

## 2015-10-04 ENCOUNTER — Encounter: Payer: Self-pay | Admitting: Family Medicine

## 2015-10-04 VITALS — BP 130/80 | HR 87 | Temp 97.7°F | Ht 64.0 in | Wt 277.0 lb

## 2015-10-04 DIAGNOSIS — R1013 Epigastric pain: Secondary | ICD-10-CM | POA: Insufficient documentation

## 2015-10-04 DIAGNOSIS — D509 Iron deficiency anemia, unspecified: Secondary | ICD-10-CM | POA: Diagnosis not present

## 2015-10-04 DIAGNOSIS — R079 Chest pain, unspecified: Secondary | ICD-10-CM

## 2015-10-04 LAB — CBC
HEMATOCRIT: 38.8 % (ref 36.0–46.0)
Hemoglobin: 12 g/dL (ref 12.0–15.0)
MCH: 25.1 pg — AB (ref 26.0–34.0)
MCHC: 30.9 g/dL (ref 30.0–36.0)
MCV: 81 fL (ref 78.0–100.0)
MPV: 10 fL (ref 8.6–12.4)
PLATELETS: 354 10*3/uL (ref 150–400)
RBC: 4.79 MIL/uL (ref 3.87–5.11)
RDW: 19 % — AB (ref 11.5–15.5)
WBC: 11.4 10*3/uL — ABNORMAL HIGH (ref 4.0–10.5)

## 2015-10-04 LAB — POCT H PYLORI SCREEN: H PYLORI SCREEN, POC: POSITIVE

## 2015-10-04 MED ORDER — PANTOPRAZOLE SODIUM 40 MG PO TBEC: 80.0000 mg | DELAYED_RELEASE_TABLET | Freq: Every day | ORAL | Status: DC

## 2015-10-04 NOTE — Patient Instructions (Signed)
It was a pleasure seeing you today in our clinic. Today we discussed your chest/abdominal pain. Here is the treatment plan we have discussed and agreed upon together:   - I believe your chest/abdominal pain is likely being caused by an ulceration in your stomach or small intestine. - I have started you on a new medication: Protonix. Take this once a day. - I've also obtain some labs today. - I referred you to gastroenterology. You'll be contacted to schedule an appointment.

## 2015-10-04 NOTE — Assessment & Plan Note (Signed)
Patient appears to have chronic iron deficiency anemia. Recent epigastric pain suggestive of GI ulceration brings concerns with GI bleed. It is noted the patient is s/p hysterectomy with persistent anemia. - Refer to gastroenterology.

## 2015-10-04 NOTE — Assessment & Plan Note (Signed)
Patient is here with complaints of chest pain. After further evaluation it was deemed that this pain was localized over the epigastrium. Etiology currently unknown however epigastric/duodenal ulcer is deemed most likely. Differential also includes GERD, pancreatitis, cholecystitis, costochondritis. EKG in clinic was unremarkable (obtained due to reports of "chest pain"). Patient has significant risk factors for GI ulcer including tobacco use and alcohol abuse. - Initiate PPI therapy with Protonix 80 mg daily. - I placed a referral to gastroenterology. - Labs obtained today: Lipase, CBC, CMP, H. Pylori

## 2015-10-04 NOTE — Assessment & Plan Note (Signed)
See plan above.

## 2015-10-04 NOTE — Progress Notes (Signed)
CHEST PAIN Tight feeling. Constant. Nothing makes it better, nothing makes it worse. Pain is worse with food. Sourness to back of throat. occasionally has some diaphoresis but that is not associated w/ worsening CP.   Chest pain began 7 days ago. Pain is: centralized How severe is the pain: 9/10 What worsens the pain: food What makes the pain better: nothing Radiation: none  PMH History of blood clot or heart problems or aneurysms: none  Symptoms Nausea/vomiting: yes, vomited last night Shortness of breath: unchanged from baseline Pleuritic pain: no Cough: baseline Swelling of legs: no Syncope: no Heart burn or food sticking: yes Immobility: no  Review of Symptoms - see HPI PMH - Smoking status noted (one pack per day), drinks two 40oz beers daily, no coffee use  Objective: BP 130/80 mmHg  Pulse 87  Temp(Src) 97.7 F (36.5 C) (Oral)  Ht 5\' 4"  (1.626 m)  Wt 277 lb (125.646 kg)  BMI 47.52 kg/m2  SpO2 97%  LMP 02/21/2015 (Approximate) Gen: NAD, alert, cooperative, and pleasant. HEENT: NCAT, EOMI, PERRL CV: RRR, no murmur Resp: CTAB, no wheezes, non-labored Abd: Morbidly obese, soft, significant epigastric tenderness, Murphy's negative, BS present, no guarding or organomegaly Ext: No edema, warm Neuro: Alert and oriented, Speech clear, No gross deficits  Assessment and plan:  Abdominal pain, epigastric Patient is here with complaints of chest pain. After further evaluation it was deemed that this pain was localized over the epigastrium. Etiology currently unknown however epigastric/duodenal ulcer is deemed most likely. Differential also includes GERD, pancreatitis, cholecystitis, costochondritis. EKG in clinic was unremarkable (obtained due to reports of "chest pain"). Patient has significant risk factors for GI ulcer including tobacco use and alcohol abuse. - Initiate PPI therapy with Protonix 80 mg daily. - I placed a referral to gastroenterology. - Labs obtained  today: Lipase, CBC, CMP, H. Pylori  Chest pain See plan above.  Iron deficiency anemia Patient appears to have chronic iron deficiency anemia. Recent epigastric pain suggestive of GI ulceration brings concerns with GI bleed. It is noted the patient is s/p hysterectomy with persistent anemia. - Refer to gastroenterology.    Orders Placed This Encounter  Procedures  . Lipase  . CBC  . COMPLETE METABOLIC PANEL WITH GFR  . Ambulatory referral to Gastroenterology    Referral Priority:  Routine    Referral Type:  Consultation    Referral Reason:  Specialty Services Required    Number of Visits Requested:  1  . H.pylori screen, POC    IgG Ab. Screen  . EKG 12-Lead    Meds ordered this encounter  Medications  . pantoprazole (PROTONIX) 40 MG tablet    Sig: Take 2 tablets (80 mg total) by mouth daily.    Dispense:  30 tablet    Refill:  1     Elberta Leatherwood, MD,MS,  PGY2 10/04/2015 8:02 PM

## 2015-10-05 ENCOUNTER — Telehealth: Payer: Self-pay | Admitting: Family Medicine

## 2015-10-05 ENCOUNTER — Other Ambulatory Visit: Payer: Self-pay | Admitting: Family Medicine

## 2015-10-05 LAB — COMPLETE METABOLIC PANEL WITH GFR
ALT: 21 U/L (ref 6–29)
AST: 19 U/L (ref 10–35)
Albumin: 3.9 g/dL (ref 3.6–5.1)
Alkaline Phosphatase: 77 U/L (ref 33–115)
BUN: 15 mg/dL (ref 7–25)
CALCIUM: 10.2 mg/dL (ref 8.6–10.2)
CHLORIDE: 104 mmol/L (ref 98–110)
CO2: 27 mmol/L (ref 20–31)
CREATININE: 0.84 mg/dL (ref 0.50–1.10)
GFR, EST NON AFRICAN AMERICAN: 82 mL/min (ref >=60)
Glucose, Bld: 88 mg/dL (ref 65–99)
POTASSIUM: 4.6 mmol/L (ref 3.5–5.3)
Sodium: 139 mmol/L (ref 135–146)
Total Bilirubin: 0.6 mg/dL (ref 0.2–1.2)
Total Protein: 6.8 g/dL (ref 6.1–8.1)

## 2015-10-05 LAB — LIPASE: Lipase: 11 U/L (ref 7–60)

## 2015-10-05 MED ORDER — CLARITHROMYCIN 500 MG PO TABS: 500.0000 mg | ORAL_TABLET | Freq: Two times a day (BID) | ORAL | Status: DC

## 2015-10-05 MED ORDER — AMOXICILLIN 500 MG PO CAPS: 1000.0000 mg | ORAL_CAPSULE | Freq: Two times a day (BID) | ORAL | Status: DC

## 2015-10-05 NOTE — Telephone Encounter (Signed)
Called Patient with results of her recent labs. It appears as though she may have an infection with H. Pylori. Patient asked appropriate questions. I have called her in amoxicillin and clarithromycin twice a day 14 days. I've asked that she keep her appointment with gastroenterology. Patient stated her understanding.

## 2015-10-07 ENCOUNTER — Ambulatory Visit (INDEPENDENT_AMBULATORY_CARE_PROVIDER_SITE_OTHER): Payer: Medicare Other | Admitting: Gastroenterology

## 2015-10-07 ENCOUNTER — Encounter: Payer: Self-pay | Admitting: Gastroenterology

## 2015-10-07 VITALS — BP 126/76 | HR 92 | Ht 64.0 in | Wt 285.4 lb

## 2015-10-07 DIAGNOSIS — B9681 Helicobacter pylori [H. pylori] as the cause of diseases classified elsewhere: Secondary | ICD-10-CM

## 2015-10-07 DIAGNOSIS — R1013 Epigastric pain: Secondary | ICD-10-CM

## 2015-10-07 DIAGNOSIS — A048 Other specified bacterial intestinal infections: Secondary | ICD-10-CM

## 2015-10-07 DIAGNOSIS — Z1211 Encounter for screening for malignant neoplasm of colon: Secondary | ICD-10-CM | POA: Insufficient documentation

## 2015-10-07 MED ORDER — NA SULFATE-K SULFATE-MG SULF 17.5-3.13-1.6 GM/177ML PO SOLN: 1.0000 | Freq: Once | ORAL | Status: DC

## 2015-10-07 NOTE — Patient Instructions (Addendum)
You have been given a separate informational sheet regarding your tobacco use, the importance of quitting and local resources to help you quit.  You have been scheduled for an endoscopy and colonoscopy. Please follow the written instructions given to you at your visit today. Please pick up your prep supplies at the pharmacy within the next 1-3 days. If you use inhalers (even only as needed), please bring them with you on the day of your procedure. Your physician has requested that you go to www.startemmi.com and enter the access code given to you at your visit today. This web site gives a general overview about your procedure. However, you should still follow specific instructions given to you by our office regarding your preparation for the procedure.  

## 2015-10-07 NOTE — Progress Notes (Addendum)
10/07/2015 SHEMICKA COHRS [id number] 09/09/1965   HISTORY OF PRESENT ILLNESS:  This is a pleasant 50 year old female who is new to our practice and was referred here by her PCP, Dr. Alease Frame, for evaluation of epigastric abdominal pain.  Tells me that she's had epigastric abdominal pain and some chest pains intermittently as well that have come and gone for years.  Pains seem to get worse with eating, but she denies any dysphagia or painful swallowing.  She was recently found to have positive Hpylori so is currently on treatment with amoxicillin, clarithromycin, and BID PPI.  She tells me that recently she has actually been feeling better.  CT scan abdomen and pelvis with contrast in 06/2015 showed a liver hemangioma but was otherwise unremarkable.  Lipase, CBC, and CMP all unremarkable.  Never had colonoscopy in the past.  Denies any bowel complaints.   Past Medical History  Diagnosis Date  . Anemia   . Asthma   . GERD (gastroesophageal reflux disease)   . Esophagitis    Past Surgical History  Procedure Laterality Date  . Cesarean section    . Tubal ligation    . Tibia fracture surgery    . Ankle surgery Left   . Finger surgery    . Cystoscopy N/A 04/12/2015    Procedure: CYSTOSCOPY;  Surgeon: Lavonia Drafts, MD;  Location: [facility];  Service: Gynecology;  Laterality: N/A;  . Abdominal hysterectomy  04/12/15    reports that she has been smoking Cigarettes.  She started smoking about 13 years ago. She has been smoking about 1.50 packs per day. She has never used smokeless tobacco. She reports that she drinks alcohol. She reports that she does not use illicit drugs. family history includes Diabetes in her mother; Heart disease in her mother; Lung cancer (age of onset: 65) in her father. No Known Allergies    Outpatient Encounter Prescriptions as of 10/07/2015  Medication Sig  . albuterol (PROVENTIL HFA;VENTOLIN HFA) 108 (90 BASE) MCG/ACT inhaler Inhale 2 puffs into the lungs  every 4 (four) hours as needed for wheezing or shortness of breath.  Marland Kitchen alum & mag hydroxide-simeth (MAALOX/MYLANTA) 200-200-20 MG/5ML suspension Take 30 mLs by mouth every 6 (six) hours as needed for indigestion or heartburn.  Marland Kitchen amoxicillin (AMOXIL) 500 MG capsule Take 2 capsules (1,000 mg total) by mouth 2 (two) times daily.  . clarithromycin (BIAXIN) 500 MG tablet Take 1 tablet (500 mg total) by mouth 2 (two) times daily.  Marland Kitchen docusate sodium (COLACE) 100 MG capsule Take 1 capsule (100 mg total) by mouth 2 (two) times daily.  . ferrous sulfate 220 (44 FE) MG/5ML solution Take 5 mLs (220 mg total) by mouth 3 (three) times daily with meals.  . gabapentin (NEURONTIN) 100 MG capsule Take 1 capsule (100 mg total) by mouth 3 (three) times daily.  . pantoprazole (PROTONIX) 40 MG tablet Take 2 tablets (80 mg total) by mouth daily.  . traMADol (ULTRAM) 50 MG tablet Take 1-2 tabs q8h prn pain  . Na Sulfate-K Sulfate-Mg Sulf SOLN Take 1 kit by mouth once.   No facility-administered encounter medications on file as of 10/07/2015.     REVIEW OF SYSTEMS  : All other systems reviewed and negative except where noted in the History of Present Illness.   PHYSICAL EXAM: BP 126/76 mmHg  Pulse 92  Ht _0  (1.626 m)  Wt 285 lb 6 oz (129.445 kg)  BMI 48.96 kg/m2  LMP 02/21/2015 (Approximate) General:  Well developed black female in no acute distress Head: Normocephalic and atraumatic Eyes:  Sclerae anicteric, conjunctiva pink. Ears: Normal auditory acuity Lungs: Clear throughout to auscultation Heart: Regular rate and rhythm Abdomen: Soft, non-distended.  Normal bowel sounds.  Non-tender. Rectal:  Will be done at the time of colonoscopy. Musculoskeletal: Symmetrical with no gross deformities  Skin: No lesions on visible extremities Extremities: No edema  Neurological: Alert oriented x 4, grossly non-focal Psychological:  Alert and cooperative. Normal mood and affect  ASSESSMENT AND PLAN: -Epigastric  pain and some atypical chest pain:  Recently diagnosed with Helicobacter Pylori and is currently on treatment for that.  She is feeling better and will complete that regimen, but patient would like EGD.  Rule out esophagitis related to reflux, ulcer disease, etc, all of which we would expect improvement on PPI therapy. -Screening colonoscopy:  Patient is AA and has never undergone colonoscopy in the past.  Will schedule with Dr. Hilarie Fredrickson.    *The risks, benefits, and alternatives to EGD and colonoscopy were discussed with the patient and she consents to proceed.   CC:  McKeag, Marylynn Pearson, MD   Addendum: Reviewed and agree with initial management. Jerene Bears, MD

## 2015-10-19 ENCOUNTER — Ambulatory Visit (INDEPENDENT_AMBULATORY_CARE_PROVIDER_SITE_OTHER): Payer: Medicare Other | Admitting: Sports Medicine

## 2015-10-19 ENCOUNTER — Encounter: Payer: Self-pay | Admitting: Sports Medicine

## 2015-10-19 VITALS — BP 141/61 | Ht 64.0 in | Wt 270.0 lb

## 2015-10-19 DIAGNOSIS — M1712 Unilateral primary osteoarthritis, left knee: Secondary | ICD-10-CM | POA: Diagnosis not present

## 2015-10-19 DIAGNOSIS — M25562 Pain in left knee: Secondary | ICD-10-CM | POA: Diagnosis not present

## 2015-10-19 NOTE — Progress Notes (Signed)
   Subjective:    Patient ID: Kendra [name], female    DOB: April 02, 1966, 50 y.o.   MRN: [id number]  HPI  Patient comes in today for follow-up on left knee pain and right shoulder pain. Right shoulder pain has resolved with a fluoroscopically guided intra-articular cortisone injection. Unfortunately, her left knee pain persists. X-rays done in 2015 showed end-stage medial compartmental DJD. She has responded favorably to cortisone injections in the past but her last injection done on January 25 only provided her with about 3-4 days of symptom relief. Her pain is worse with activity. It does improve some at rest. She does get intermittent swelling. She takes tramadol as needed for pain which does seem to help.    Review of Systems     Objective:   Physical Exam  Obese. No acute distress  Right shoulder: Full active and passive range of motion. No pain. Good rotator cuff strength. Neurovascularly intact distally.  Left knee: Range of motion 0-100. She is tender to palpation along the medial joint line with pain but no popping with McMurray's. 1+ patellofemoral crepitus. No effusion. Good ligamentous stability. Neurovascularly intact distally. Walking with a limp.      Assessment & Plan:  Improved right shoulder pain secondary to glenohumeral DJD Persistent left knee pain secondary to end-stage medial compartmental DJD  Although she is young for knee replacement, I am not optimistic that continued conservative treatment will be of any benefit. She is bone-on-bone in the medial compartment and has not received any benefit from a recent cortisone injection. Therefore, I recommended consultation with Dr. Mardelle Matte to discuss continued conservative treatment versus total knee arthroplasty versus possible unicompartmental replacement. I'll defer further workup and treatment in regards to her left knee to Dr. Luanna Cole discretion and the patient will follow-up with me as needed.

## 2015-10-20 DIAGNOSIS — M25562 Pain in left knee: Secondary | ICD-10-CM | POA: Diagnosis not present

## 2015-10-20 DIAGNOSIS — M47817 Spondylosis without myelopathy or radiculopathy, lumbosacral region: Secondary | ICD-10-CM | POA: Diagnosis not present

## 2015-10-26 ENCOUNTER — Ambulatory Visit (AMBULATORY_SURGERY_CENTER): Payer: Medicare Other | Admitting: Internal Medicine

## 2015-10-26 ENCOUNTER — Encounter: Payer: Self-pay | Admitting: Internal Medicine

## 2015-10-26 VITALS — BP 111/63 | HR 69 | Temp 97.7°F | Resp 21 | Ht 64.0 in | Wt 285.0 lb

## 2015-10-26 DIAGNOSIS — K635 Polyp of colon: Secondary | ICD-10-CM

## 2015-10-26 DIAGNOSIS — D127 Benign neoplasm of rectosigmoid junction: Secondary | ICD-10-CM | POA: Diagnosis not present

## 2015-10-26 DIAGNOSIS — K3189 Other diseases of stomach and duodenum: Secondary | ICD-10-CM | POA: Diagnosis not present

## 2015-10-26 DIAGNOSIS — J45909 Unspecified asthma, uncomplicated: Secondary | ICD-10-CM | POA: Diagnosis not present

## 2015-10-26 DIAGNOSIS — Z8619 Personal history of other infectious and parasitic diseases: Secondary | ICD-10-CM | POA: Diagnosis not present

## 2015-10-26 DIAGNOSIS — K219 Gastro-esophageal reflux disease without esophagitis: Secondary | ICD-10-CM | POA: Diagnosis not present

## 2015-10-26 DIAGNOSIS — R1013 Epigastric pain: Secondary | ICD-10-CM | POA: Diagnosis not present

## 2015-10-26 DIAGNOSIS — Z1211 Encounter for screening for malignant neoplasm of colon: Secondary | ICD-10-CM

## 2015-10-26 DIAGNOSIS — K2950 Unspecified chronic gastritis without bleeding: Secondary | ICD-10-CM | POA: Diagnosis not present

## 2015-10-26 MED ORDER — SODIUM CHLORIDE 0.9 % IV SOLN: 500.0000 mL | INTRAVENOUS | Status: DC

## 2015-10-26 NOTE — Op Note (Signed)
[facility]  [address] [place], [address]   COLONOSCOPY PROCEDURE REPORT  PATIENT: Kendra [name], Kendra [name]  MR#: [id number] BIRTHDATE: 01-21-1966 , 49  yrs. old GENDER: female ENDOSCOPIST: Jerene Bears, MD PROCEDURE DATE:  10/26/2015 PROCEDURE:   Colonoscopy, screening and Colonoscopy with snare polypectomy First Screening Colonoscopy - Avg.  risk and is 50 yrs.  old or older Yes.  Prior Negative Screening - Now for repeat screening. N/A  History of Adenoma - Now for follow-up colonoscopy & has been > or = to 3 yrs.  N/A  Polyps removed today? Yes ASA CLASS:   Class II INDICATIONS:Screening for colonic neoplasia and Colorectal Neoplasm Risk Assessment for this procedure is average risk. MEDICATIONS: Monitored anesthesia care, Propofol 400 mg IV, and this was the total dose used for all procedures at this session  DESCRIPTION OF PROCEDURE:   After the risks benefits and alternatives of the procedure were thoroughly explained, informed consent was obtained.  The digital rectal exam revealed no rectal mass.   The LB [id number]  endoscope was introduced through the anus and advanced to the terminal ileum which was intubated for a short distance. No adverse events experienced.   The quality of the prep was excellent.  (Suprep was used)  The instrument was then slowly withdrawn as the colon was fully examined. Estimated blood loss is zero unless otherwise noted in this procedure report.  COLON FINDINGS: The examined terminal ileum appeared to be normal. A sessile polyp measuring 4 mm in size was found in the rectosigmoid colon.  A polypectomy was performed with a cold snare. The resection was complete, the polyp tissue was completely retrieved and sent to histology.   The examination was otherwise normal.  Retroflexed views revealed small internal hemorrhoids. The time to cecum = 1.9 Withdrawal time = 7.0   The scope was withdrawn and the procedure  completed. COMPLICATIONS: There were no immediate complications.  ENDOSCOPIC IMPRESSION: 1.   The examined terminal ileum appeared to be normal 2.   Sessile polyp was found in the rectosigmoid colon; polypectomy was performed with a cold snare 3.   The examination was otherwise normal  RECOMMENDATIONS: 1.  Await pathology results 2.  If the polyp removed today is proven to be an adenomatous (pre-cancerous) polyp, you will need a repeat colonoscopy in 5 years.  Otherwise you should continue to follow colorectal cancer screening guidelines for "routine risk" patients with colonoscopy in 10 years.  You will receive a letter within 1-2 weeks with the results of your biopsy as well as final recommendations.  Please call my office if you have not received a letter after 3 weeks.  eSigned:  Jerene Bears, MD 10/26/2015 1:47 PM   cc:  the patient, Dr. Alease Frame

## 2015-10-26 NOTE — Op Note (Signed)
[name], [address]   ENDOSCOPY PROCEDURE REPORT  PATIENT: Kendra, [name]  MR#: [id number] BIRTHDATE: 1966-08-10 , 49  yrs. old GENDER: female ENDOSCOPIST: Jerene Bears, MD PROCEDURE DATE:  10/26/2015 PROCEDURE:  EGD, diagnostic and EGD w/ biopsy ASA CLASS:     Class II INDICATIONS:  epigastric pain history of H.  pylori status post treatment. MEDICATIONS: Monitored anesthesia care and Propofol 300 mg IV TOPICAL ANESTHETIC: none  DESCRIPTION OF PROCEDURE: After the risks benefits and alternatives of the procedure were thoroughly explained, informed consent was obtained.  The LB [id number] endoscope was introduced through the mouth and advanced to the second portion of the duodenum , Without limitations.  The instrument was slowly withdrawn as the mucosa was fully examined.   ESOPHAGUS: The mucosa of the esophagus appeared normal.   Z-line regular at 40 cm.  STOMACH: The mucosa of the stomach appeared normal.  Cold forcep biopsies were taken at the gastric body, antrum and angularis to evaluate for h.  pylori given history of H. pylori infection and duodenitis seen today.  DUODENUM: Mild duodenal inflammation was found in the duodenal bulb. Two soft subepithelial lesions, measuring 8-10 mm, were located in the 2nd part of the duodenum near the expected ampulla (ampulla not definitively seen today with forwarding viewing endoscope). Multiple biopsies (bite-on-bite) were performed using cold forceps (r/o GIST, lipoma).  Retroflexed views revealed no abnormalities.     The scope was then withdrawn from the patient and the procedure completed.  COMPLICATIONS: There were no immediate complications.     ENDOSCOPIC IMPRESSION: 1.   The mucosa of the esophagus appeared normal 2.   The mucosa of the stomach appeared normal; multiple biopsies 3.   Duodenal inflammation was found in the duodenal bulb 4.   Two 8-10 mm  subepithelial lesions were located in the 2nd part of the duodenum; multiple biopsies were performed  RECOMMENDATIONS: 1.  Await biopsy results 2.  Follow-up of helicobacter pylori status, treat if indicated 3.  Proceed with a Colonoscopy.  eSigned:  Jerene Bears, MD 10/26/2015 1:45 PM    CC: the patient, Dr. Alease Frame  PATIENT NAME:  Kendra, [name] MR#: [id number]

## 2015-10-26 NOTE — Progress Notes (Signed)
Report to PACU, RN, vss, BBS= Clear.  

## 2015-10-26 NOTE — Patient Instructions (Signed)
Colon polyp x 1 removed. Biopsies taken.  Handouts given on polyps, hemorrhoids.   YOU HAD AN ENDOSCOPIC PROCEDURE TODAY AT [facility]:   Refer to the procedure report that was given to you for any specific questions about what was found during the examination.  If the procedure report does not answer your questions, please call your gastroenterologist to clarify.  If you requested that your care partner not be given the details of your procedure findings, then the procedure report has been included in a sealed envelope for you to review at your convenience later.  YOU SHOULD EXPECT: Some feelings of bloating in the abdomen. Passage of more gas than usual.  Walking can help get rid of the air that was put into your GI tract during the procedure and reduce the bloating. If you had a lower endoscopy (such as a colonoscopy or flexible sigmoidoscopy) you may notice spotting of blood in your stool or on the toilet paper. If you underwent a bowel prep for your procedure, you may not have a normal bowel movement for a few days.  Please Note:  You might notice some irritation and congestion in your nose or some drainage.  This is from the oxygen used during your procedure.  There is no need for concern and it should clear up in a day or so.  SYMPTOMS TO REPORT IMMEDIATELY:   Following lower endoscopy (colonoscopy or flexible sigmoidoscopy):  Excessive amounts of blood in the stool  Significant tenderness or worsening of abdominal pains  Swelling of the abdomen that is new, acute  Fever of 100F or higher   Following upper endoscopy (EGD)  Vomiting of blood or coffee ground material  New chest pain or pain under the shoulder blades  Painful or persistently difficult swallowing  New shortness of breath  Fever of 100F or higher  Black, tarry-looking stools  For urgent or emergent issues, a gastroenterologist can be reached at any hour by calling [phone].   DIET: Your  first meal following the procedure should be a small meal and then it is ok to progress to your normal diet. Heavy or fried foods are harder to digest and may make you feel nauseous or bloated.  Likewise, meals heavy in dairy and vegetables can increase bloating.  Drink plenty of fluids but you should avoid alcoholic beverages for 24 hours.  ACTIVITY:  You should plan to take it easy for the rest of today and you should NOT DRIVE or use heavy machinery until tomorrow (because of the sedation medicines used during the test).    FOLLOW UP: Our staff will call the number listed on your records the next business day following your procedure to check on you and address any questions or concerns that you may have regarding the information given to you following your procedure. If we do not reach you, we will leave a message.  However, if you are feeling well and you are not experiencing any problems, there is no need to return our call.  We will assume that you have returned to your regular daily activities without incident.  If any biopsies were taken you will be contacted by phone or by letter within the next 1-3 weeks.  Please call us at [phone] if you have not heard about the biopsies in 3 weeks.    SIGNATURES/CONFIDENTIALITY: You and/or your care partner have signed paperwork which will be entered into your electronic medical record.  These signatures attest to  the fact that that the information above on your After Visit Summary has been reviewed and is understood.  Full responsibility of the confidentiality of this discharge information lies with you and/or your care-partner.

## 2015-10-26 NOTE — Progress Notes (Signed)
Called to room to assist during endoscopic procedure.  Patient ID and intended procedure confirmed with present staff. Received instructions for my participation in the procedure from the performing physician.  

## 2015-10-27 ENCOUNTER — Telehealth: Payer: Self-pay | Admitting: *Deleted

## 2015-10-27 DIAGNOSIS — M25562 Pain in left knee: Secondary | ICD-10-CM | POA: Diagnosis not present

## 2015-10-27 NOTE — Telephone Encounter (Signed)
  Follow up Call-  Call back number 10/26/2015  Post procedure Call Back phone  # [phone]  Permission to leave phone message Yes     Patient questions:  Do you have a fever, pain , or abdominal swelling? No. Pain Score  0 *  Have you tolerated food without any problems? Yes.    Have you been able to return to your normal activities? Yes.    Do you have any questions about your discharge instructions: Diet   No. Medications  No. Follow up visit  No.  Do you have questions or concerns about your Care? No.  Actions: * If pain score is 4 or above: No action needed, pain <4.

## 2015-11-01 ENCOUNTER — Encounter: Payer: Self-pay | Admitting: Internal Medicine

## 2015-11-15 ENCOUNTER — Other Ambulatory Visit: Payer: Self-pay | Admitting: *Deleted

## 2015-11-15 MED ORDER — GABAPENTIN 100 MG PO CAPS: 100.0000 mg | ORAL_CAPSULE | Freq: Three times a day (TID) | ORAL | Status: DC

## 2016-01-09 ENCOUNTER — Telehealth: Payer: Self-pay | Admitting: *Deleted

## 2016-01-09 NOTE — Telephone Encounter (Signed)
Kendra [name] called and left a voicemail message 01/06/16 afternoon stating she is trying to get in touch with a nurse. States she was supposed to see the doctor  Last year, but had so many other things to do. States she has an appt 01/25/16 but she wants to let doctor/nurse know that when her bladder is full that It hurts very bad she can hardly move- not sure if uti or what.    I called [name] and was unable to leave a message - heard phone ring many times but no voicemail picked up.

## 2016-01-10 NOTE — Telephone Encounter (Signed)
Called Kendra [name]- and she states it hurts like when you have a c/section in her pelvis when she goes to get up , or goes to restroom. She denies burning or pain with urination. We discussed that this does not sound like a uti but  when she comes for her appt the doctor will Examine her and she can discuss these concerns then. She voices understanding.

## 2016-01-25 ENCOUNTER — Other Ambulatory Visit: Payer: Self-pay

## 2016-01-25 ENCOUNTER — Other Ambulatory Visit: Payer: Self-pay | Admitting: Obstetrics & Gynecology

## 2016-01-25 ENCOUNTER — Encounter: Payer: Self-pay | Admitting: Obstetrics & Gynecology

## 2016-01-25 ENCOUNTER — Ambulatory Visit (INDEPENDENT_AMBULATORY_CARE_PROVIDER_SITE_OTHER): Payer: Medicare Other | Admitting: Obstetrics & Gynecology

## 2016-01-25 VITALS — BP 158/91 | HR 76 | Wt 292.3 lb

## 2016-01-25 DIAGNOSIS — Z1231 Encounter for screening mammogram for malignant neoplasm of breast: Secondary | ICD-10-CM

## 2016-01-25 DIAGNOSIS — R300 Dysuria: Secondary | ICD-10-CM

## 2016-01-25 LAB — POCT URINALYSIS DIP (DEVICE)
Bilirubin Urine: NEGATIVE
GLUCOSE, UA: NEGATIVE mg/dL
Ketones, ur: NEGATIVE mg/dL
Leukocytes, UA: NEGATIVE
Nitrite: NEGATIVE
PROTEIN: NEGATIVE mg/dL
SPECIFIC GRAVITY, URINE: 1.015 (ref 1.005–1.030)
Urobilinogen, UA: 0.2 mg/dL (ref 0.0–1.0)
pH: 5.5 (ref 5.0–8.0)

## 2016-01-26 ENCOUNTER — Encounter: Payer: Self-pay | Admitting: Obstetrics & Gynecology

## 2016-01-26 ENCOUNTER — Ambulatory Visit
Admission: RE | Admit: 2016-01-26 | Discharge: 2016-01-26 | Disposition: A | Discharge status: Home / Self Care | Payer: Medicare Other | Source: Ambulatory Visit | Attending: Obstetrics & Gynecology | Admitting: Obstetrics & Gynecology

## 2016-01-26 DIAGNOSIS — Z1231 Encounter for screening mammogram for malignant neoplasm of breast: Secondary | ICD-10-CM

## 2016-01-26 NOTE — Progress Notes (Signed)
Patient ID: Kendra [name], female   DOB: [birth year], 50 y.o.   MRN: [id number] History:  50 y.o. G3P3003 here today for post op check . Pt s/p RATH w bilateral salpingectomy.  Pt reports that she was unable to come in for her post op checks because she was busy and had other medical problems.  She reports occ abd pain in lower abd.  No meds taken specifically for that.   The following portions of the patient's history were reviewed and updated as appropriate: allergies, current medications, past family history, past medical history, past social history, past surgical history and problem list.  Review of Systems:  Pertinent items are noted in HPI.  Objective:  Physical Exam Blood pressure 158/91, pulse 76, weight 292 lb 4.8 oz (132.586 kg), last menstrual period 02/21/2015. Gen: NAD Abd: Soft, nontender and nondistended. Post sites well healed Pelvic: Normal appearing external genitalia; normal appearing vaginal mucosa. Cuff well healed. Normal discharge.  No palpable masses or adnexal tenderness  Labs and Imaging 04/12/2015 Diagnosis Uterus and bilateral fallopian tubes, cervix - PROLIFERATIVE PATTERN ENDOMETRIUM WITH BENIGN ENDOMETRIAL-TYPE POLYP FORMATION. - UNDERLYING MYOMETRIUM DEMONSTRATES LEIOMYOMA FORMATION. - BENIGN CERVIX. - BENIGN BILATERAL FALLOPIAN TUBES. - NO DYSPLASIA, ATYPIA, HYPERPLASIA, OR MALIGNANCY IDENTIFIED.  Assessment & Plan:  9 month post op check  Tobacco abuse Doing well  Screening mammogram F/u in 1 year or sooner prn rec tobacco cessation  Iwalani Templeton L. Harraway-Smith, M.D., [name]

## 2016-01-29 IMAGING — CT CT ANGIO CHEST
2 of 8 series · 17 of 46 positions shown · IV contrast (Omni 300)
Comparison: Chest x-ray 05/06/2015 ; prior CT abdomen/ pelvis
08/09/2003

CLINICAL DATA: 49-year-old female with 1 week history of shortness
of breath and sharp, central chest pain

EXAM:
CT ANGIOGRAPHY CHEST WITH CONTRAST
TECHNIQUE: Multidetector CT imaging of the chest was performed using the
standard protocol during bolus administration of intravenous
contrast. Multiplanar CT image reconstructions and MIPs were
obtained to evaluate the vascular anatomy.
CONTRAST:  90mL OMNIPAQUE IOHEXOL 350 MG/ML SOLN

[Series 5: thins · axial · 0.70mm/px · z∈[-261,-52]mm · 14 of 231 slices shown]
[im 11/231  lung]
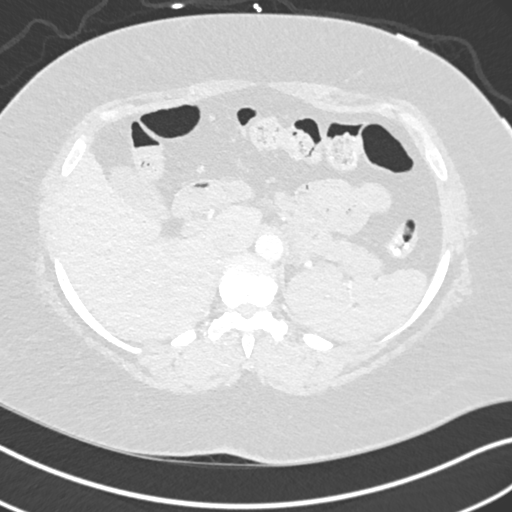
[im 32/231  soft-tissue]
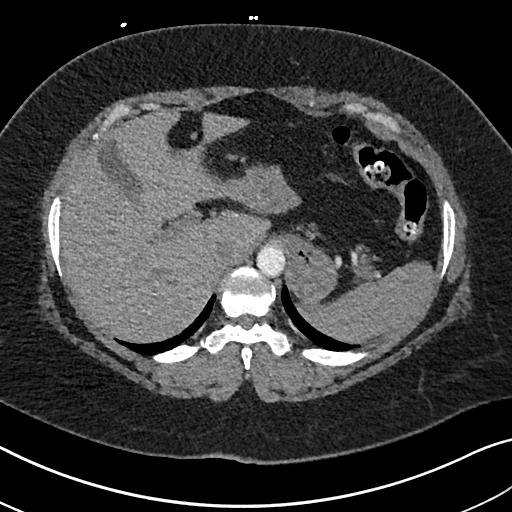
[im 42/231  lung]
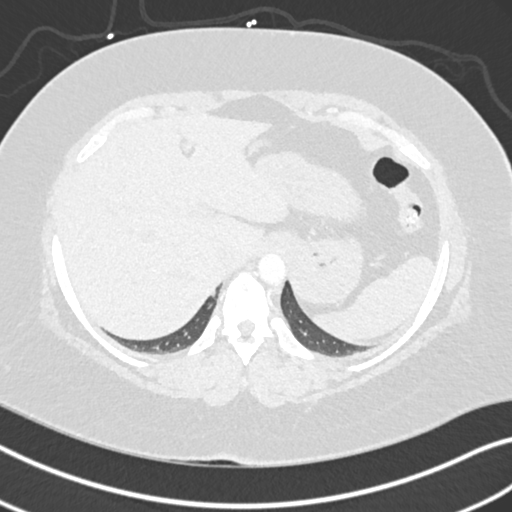
[im 63/231  soft-tissue]
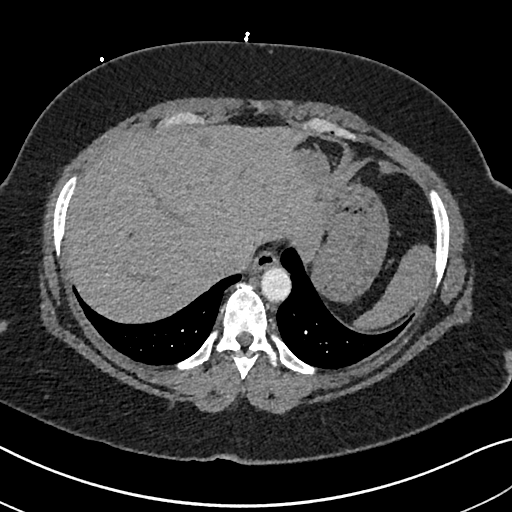
[im 74/231  lung]
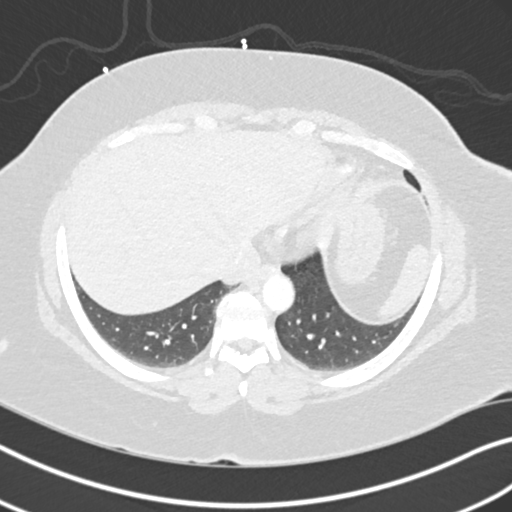
[im 95/231  soft-tissue]
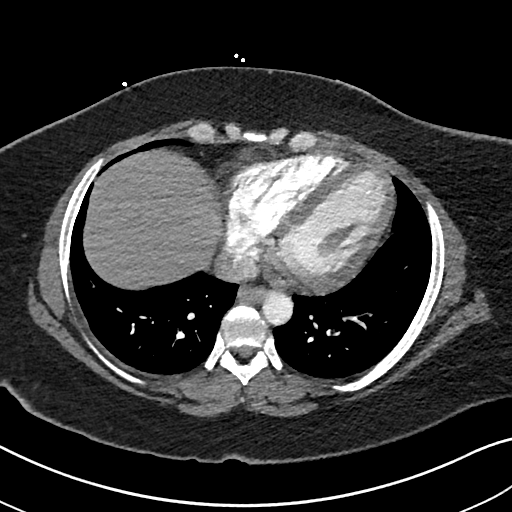
[im 105/231  lung]
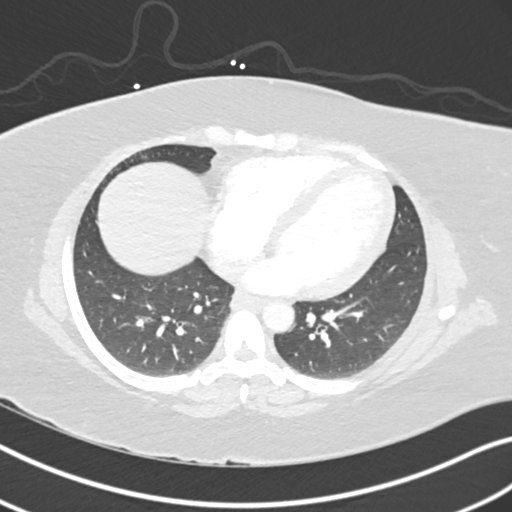
[im 126/231  soft-tissue]
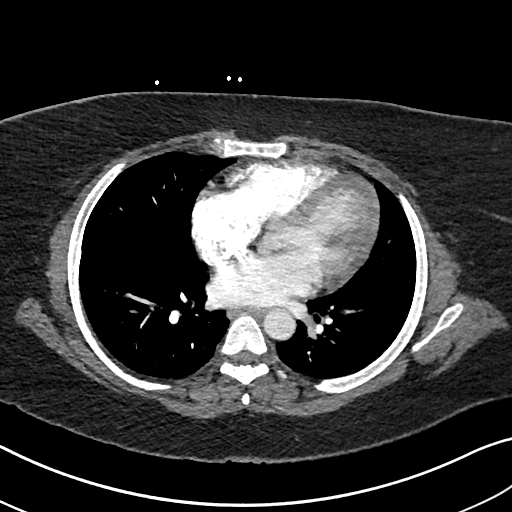
[im 136/231  lung]
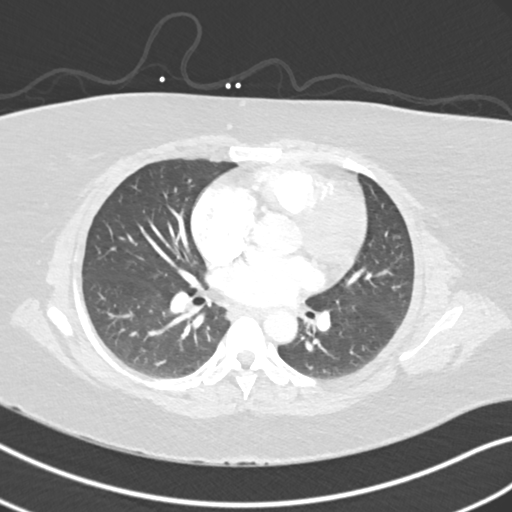
[im 157/231  soft-tissue]
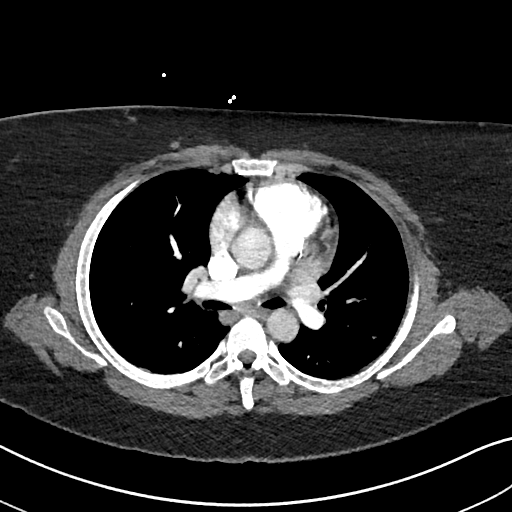
[im 168/231  lung]
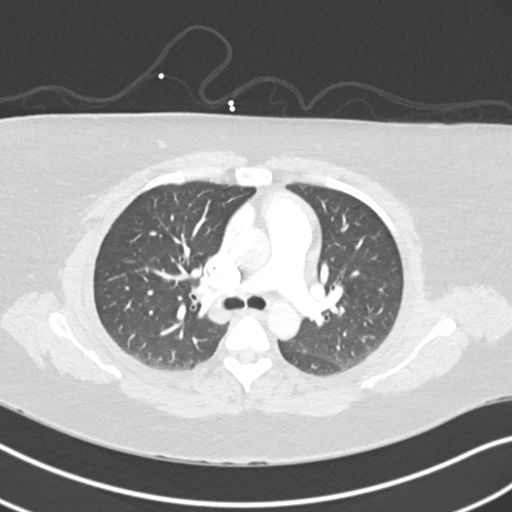
[im 189/231  soft-tissue]
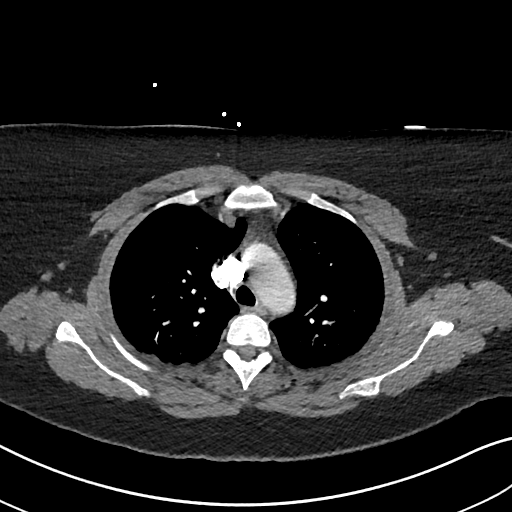
[im 199/231  lung]
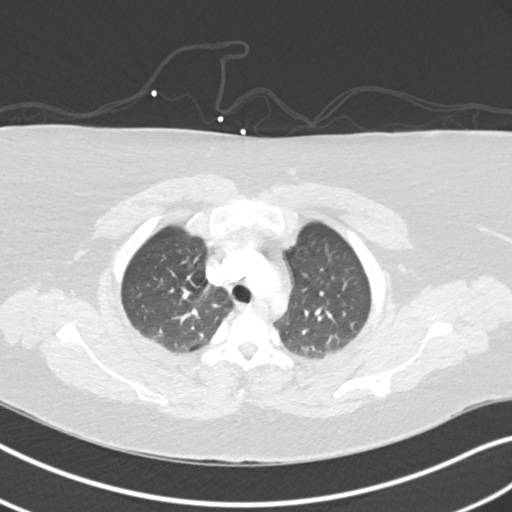
[im 220/231  soft-tissue]
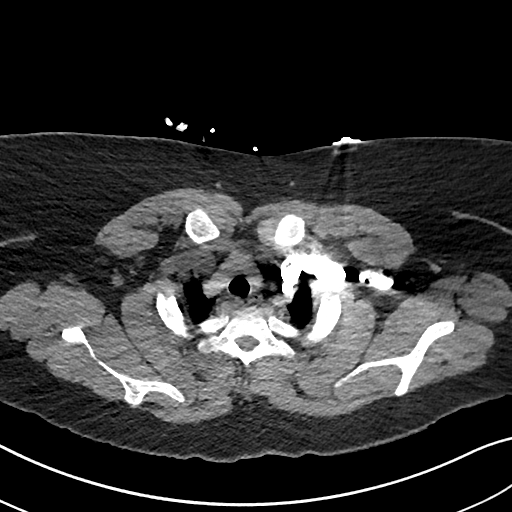

[Series 7: coronal mpr · coronal · 0.47mm/px · 3 of 119 slices shown]
[im 30/119  soft-tissue]
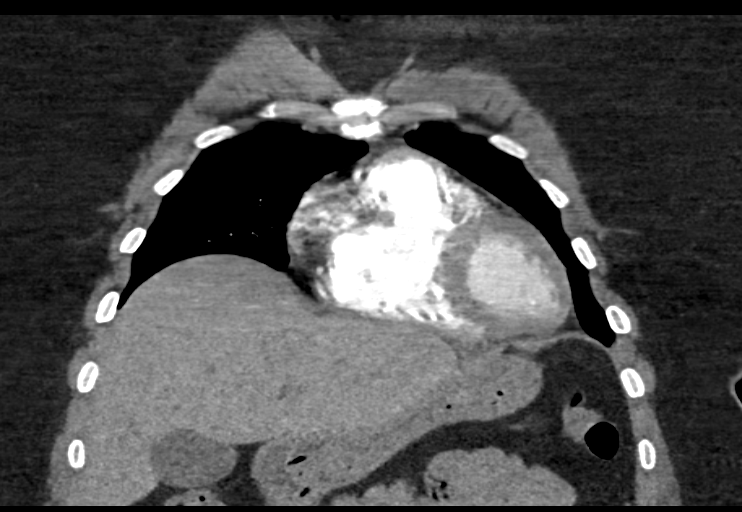
[im 60/119  soft-tissue]
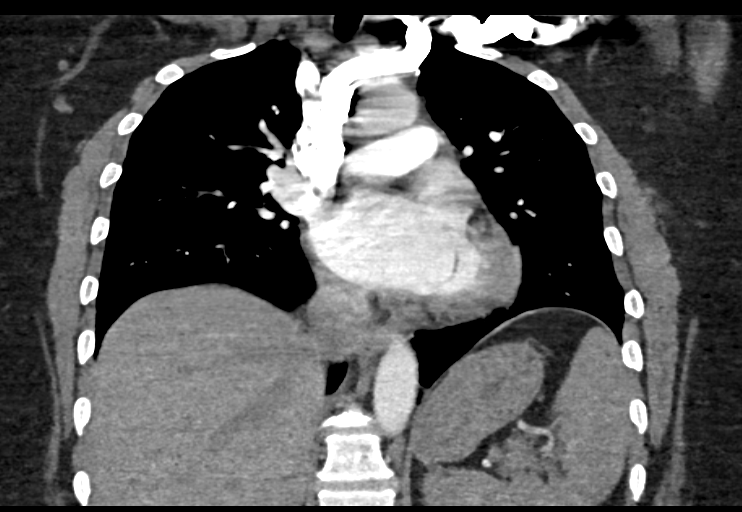
[im 89/119  soft-tissue]
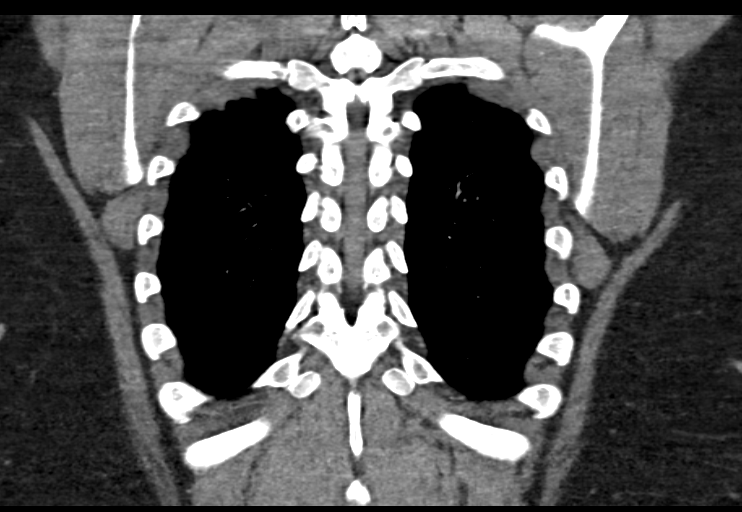

[17 of 46 positions shown; findings below may reference images not displayed]

FINDINGS: Mediastinum: Unremarkable CT appearance of the thyroid gland. No
suspicious mediastinal or hilar adenopathy. No soft tissue
mediastinal mass. The thoracic esophagus is unremarkable.

Heart/Vascular: Adequate opacification of the pulmonary arteries to
the proximal subsegmental level. No evidence of central filling
defect to suggest acute pulmonary embolus. The heart is at the upper
limits of normal for size. No pericardial effusion. Suspect trace
calcification along the course of the left anterior descending
coronary artery. Two vessel arch anatomy. The right brachiocephalic
and left common carotid artery share a common origin. No aneurysm.

Lungs/Pleura: Negative for pleural effusion.  The lungs are clear.

Bones/Soft Tissues: No acute fracture or aggressive appearing lytic
or blastic osseous lesion.

Upper Abdomen: 1.4 cm circumscribed hypoechoic structure in the
medial segment of the left hepatic lobe. Smaller, 1.0 cm
low-attenuation lesion in the periphery of the right hepatic lobe.
This second smaller lesion is essentially unchanged compared to
prior CT imaging from 7003. However, the slightly larger lesion in
the medial left lobe was not visible previously. Otherwise,
unremarkable visualized upper abdomen.

Review of the MIP images confirms the above findings.
IMPRESSION: 1. Negative for acute pulmonary embolus, pneumonia or other acute
cardiopulmonary process.
2. Incidental note is made of a nonspecific 1.4 cm hypoechoic
structure in the medial segment of the left hepatic lobe.
Differential considerations include hepatic cyst, hemangioma, and
less likely a primary or neoplastic malignant lesion. Consider
further evaluation with CT scan of the abdomen with intravenous
contrast.
3. Trace calcification along the course of the left anterior
descending coronary artery. Please note that although the presence
of coronary artery calcium documents the presence of coronary artery
disease, the severity of this disease and any potential stenosis
cannot be assessed on this non-gated CT examination. Assessment for
potential risk factor modification, dietary therapy or pharmacologic
therapy may be warranted, if clinically indicated.

## 2016-02-13 ENCOUNTER — Telehealth: Payer: Self-pay | Admitting: Family Medicine

## 2016-02-13 NOTE — Telephone Encounter (Signed)
Feet have been swollen for several weeks. Now they are so swollen pt cannot walk.  She has an appt on Wed. Please advise

## 2016-02-14 NOTE — Telephone Encounter (Signed)
Spoke with patietn, she reports gradual swelling of ankles and knees that has recently worsened to the point where she can barely walk. Schedule SDA with Adamo for tomm, in the meantime reccommended that patient kepp legs elevated and to and ice joints as needed. Also continue to use her tramadol for an pain.

## 2016-02-14 NOTE — Telephone Encounter (Signed)
Patient calling again would like to speak to someone about the swelling. Knees swelling as well. Please advise.

## 2016-02-15 ENCOUNTER — Encounter (HOSPITAL_COMMUNITY): Payer: Self-pay | Admitting: Nurse Practitioner

## 2016-02-15 ENCOUNTER — Encounter: Payer: Self-pay | Admitting: Family Medicine

## 2016-02-15 ENCOUNTER — Ambulatory Visit (INDEPENDENT_AMBULATORY_CARE_PROVIDER_SITE_OTHER): Payer: Medicare Other | Admitting: Family Medicine

## 2016-02-15 ENCOUNTER — Emergency Department (HOSPITAL_COMMUNITY)
Admission: EM | Admit: 2016-02-15 | Discharge: 2016-02-15 | Disposition: A | Discharge status: Home / Self Care | Payer: Medicare Other | Attending: Emergency Medicine | Admitting: Emergency Medicine

## 2016-02-15 ENCOUNTER — Emergency Department (HOSPITAL_COMMUNITY): Payer: Medicare Other

## 2016-02-15 VITALS — BP 126/62 | HR 141 | Temp 97.6°F | Ht 64.0 in | Wt 300.2 lb

## 2016-02-15 DIAGNOSIS — J45909 Unspecified asthma, uncomplicated: Secondary | ICD-10-CM | POA: Insufficient documentation

## 2016-02-15 DIAGNOSIS — R600 Localized edema: Secondary | ICD-10-CM | POA: Diagnosis not present

## 2016-02-15 DIAGNOSIS — M7989 Other specified soft tissue disorders: Secondary | ICD-10-CM | POA: Insufficient documentation

## 2016-02-15 DIAGNOSIS — R079 Chest pain, unspecified: Secondary | ICD-10-CM | POA: Diagnosis not present

## 2016-02-15 DIAGNOSIS — R609 Edema, unspecified: Secondary | ICD-10-CM

## 2016-02-15 DIAGNOSIS — F17210 Nicotine dependence, cigarettes, uncomplicated: Secondary | ICD-10-CM | POA: Diagnosis not present

## 2016-02-15 DIAGNOSIS — R0602 Shortness of breath: Secondary | ICD-10-CM | POA: Insufficient documentation

## 2016-02-15 LAB — I-STAT TROPONIN, ED: TROPONIN I, POC: 0 ng/mL (ref 0.00–0.08)

## 2016-02-15 LAB — CBC
HCT: 41.1 % (ref 36.0–46.0)
Hemoglobin: 13.2 g/dL (ref 12.0–15.0)
MCH: 30.3 pg (ref 26.0–34.0)
MCHC: 32.1 g/dL (ref 30.0–36.0)
MCV: 94.3 fL (ref 78.0–100.0)
PLATELETS: 277 10*3/uL (ref 150–400)
RBC: 4.36 MIL/uL (ref 3.87–5.11)
RDW: 17 % — AB (ref 11.5–15.5)
WBC: 6.1 10*3/uL (ref 4.0–10.5)

## 2016-02-15 LAB — BASIC METABOLIC PANEL
Anion gap: 9 (ref 5–15)
BUN: 9 mg/dL (ref 6–20)
CALCIUM: 10 mg/dL (ref 8.9–10.3)
CO2: 21 mmol/L — ABNORMAL LOW (ref 22–32)
Chloride: 108 mmol/L (ref 101–111)
Creatinine, Ser: 0.75 mg/dL (ref 0.44–1.00)
GFR calc Af Amer: >60 mL/min (ref >60)
GLUCOSE: 86 mg/dL (ref 65–99)
Potassium: 4.1 mmol/L (ref 3.5–5.1)
SODIUM: 138 mmol/L (ref 135–145)

## 2016-02-15 LAB — BRAIN NATRIURETIC PEPTIDE: B Natriuretic Peptide: 100.5 pg/mL — ABNORMAL HIGH (ref 0.0–100.0)

## 2016-02-15 MED ORDER — FUROSEMIDE 20 MG PO TABS: 20.0000 mg | ORAL_TABLET | Freq: Once | ORAL | Status: DC

## 2016-02-15 MED ORDER — FUROSEMIDE 20 MG PO TABS
20.0000 mg | ORAL_TABLET | Freq: Once | ORAL | Status: AC
  Administered 2016-02-15: 20 mg via ORAL
  Filled 2016-02-15: qty 1

## 2016-02-15 MED ORDER — POTASSIUM CHLORIDE ER 10 MEQ PO TBCR: 10.0000 meq | EXTENDED_RELEASE_TABLET | Freq: Every day | ORAL | Status: DC

## 2016-02-15 NOTE — ED Notes (Signed)
She c/o 2 week history of BLE swelling and pain. She has had some intermittent CP, SOB, dizziness as well. Family practice sent her for further evaluation

## 2016-02-15 NOTE — Discharge Instructions (Signed)

## 2016-02-15 NOTE — ED Provider Notes (Signed)
CSN: [id number]     Arrival date & time 02/15/16  1549 History   First MD Initiated Contact with Patient 02/15/16 1928     Chief Complaint  Patient presents with  . Leg Swelling    HPI Pt has had two weeks of leg swelling.  This started two weeks ago.  Nothing seems to have triggered it.  They gradually started to swell.  Occasionally she gets some chest pain and feels short of breath but not currently.   No fever, vomiting or diarrhea.  No prior history of blood clots or heart failure.  No change in diet.  NO increased salt intake.  SHe went to the [facility] clinic today and was sent to the ED.  Notes are not available from that visit at this time.  Past Medical History  Diagnosis Date  . Anemia   . Asthma   . GERD (gastroesophageal reflux disease)   . Esophagitis    Past Surgical History  Procedure Laterality Date  . Cesarean section    . Tubal ligation    . Tibia fracture surgery    . Ankle surgery Left   . Finger surgery    . Cystoscopy N/A 04/12/2015    Procedure: CYSTOSCOPY;  Surgeon: Lavonia Drafts, MD;  Location: [facility];  Service: Gynecology;  Laterality: N/A;  . Abdominal hysterectomy  04/12/15   Family History  Problem Relation Age of Onset  . Diabetes Mother   . Heart disease Mother   . Lung cancer Father 70   Social History  Substance Use Topics  . Smoking status: Current Every Day Smoker -- 1.50 packs/day    Types: Cigarettes    Start date: 08/27/2002  . Smokeless tobacco: Never Used     Comment: cut back to 4-5 cigs per day from 1ppd  . Alcohol Use: 0.0 oz/week    0 Standard drinks or equivalent per week     Comment: 2 -40oz daily   OB History    Gravida Para Term Preterm AB TAB SAB Ectopic Multiple Living   3 3 3  0 0 0 0 0 0 3     Review of Systems    Allergies  Review of patient's allergies indicates no known allergies.  Home Medications   Prior to Admission medications   Medication Sig Start Date End Date Taking? Authorizing Provider   albuterol (PROVENTIL HFA;VENTOLIN HFA) 108 (90 BASE) MCG/ACT inhaler Inhale 2 puffs into the lungs every 4 (four) hours as needed for wheezing or shortness of breath. 08/09/15  Yes Elberta Leatherwood, MD  alum & mag hydroxide-simeth (MAALOX/MYLANTA) 200-200-20 MG/5ML suspension Take 30 mLs by mouth every 6 (six) hours as needed for indigestion or heartburn. Reported on 10/26/2015   Yes Historical Provider, MD  gabapentin (NEURONTIN) 100 MG capsule Take 1 capsule (100 mg total) by mouth 3 (three) times daily. 11/15/15  Yes Elberta Leatherwood, MD  pantoprazole (PROTONIX) 40 MG tablet Take 2 tablets (80 mg total) by mouth daily. 10/04/15  Yes Elberta Leatherwood, MD  traMADol Veatrice Bourbon) 50 MG tablet Take 1-2 tabs q8h prn pain 09/21/15  Yes Timothy R Draper, DO  amoxicillin (AMOXIL) 500 MG capsule Take 2 capsules (1,000 mg total) by mouth 2 (two) times daily. 10/05/15   Elberta Leatherwood, MD  clarithromycin (BIAXIN) 500 MG tablet Take 1 tablet (500 mg total) by mouth 2 (two) times daily. 10/05/15   Elberta Leatherwood, MD  docusate sodium (COLACE) 100 MG capsule Take 1 capsule (100 mg  total) by mouth 2 (two) times daily. 04/13/15   Lavonia Drafts, MD  furosemide (LASIX) 20 MG tablet Take 1 tablet (20 mg total) by mouth once. 02/15/16   Dorie Rank, MD  potassium chloride (K-DUR) 10 MEQ tablet Take 1 tablet (10 mEq total) by mouth daily. 02/15/16   Dorie Rank, MD   BP 130/58 mmHg  Pulse 64  Temp(Src) 97.8 F (36.6 C) (Oral)  Resp 17  SpO2 98%  LMP 02/21/2015 (Approximate) Physical Exam  Constitutional: No distress.  Obese   HENT:  Head: Normocephalic and atraumatic.  Right Ear: External ear normal.  Left Ear: External ear normal.  Eyes: Conjunctivae are normal. Right eye exhibits no discharge. Left eye exhibits no discharge. No scleral icterus.  Neck: Neck supple. No tracheal deviation present.  Cardiovascular: Normal rate, regular rhythm and intact distal pulses.   Pulmonary/Chest: Effort normal and breath sounds normal. No  stridor. No respiratory distress. She has no wheezes. She has no rales.  Abdominal: Soft. Bowel sounds are normal. She exhibits no distension. There is no tenderness. There is no rebound and no guarding.  Musculoskeletal: She exhibits edema. She exhibits no tenderness.  Peripheral edema pitting bilaterally, left greater than right, scarring associated with remote ankle surgery on the left leg  Neurological: She is alert. She has normal strength. No cranial nerve deficit (no facial droop, extraocular movements intact, no slurred speech) or sensory deficit. She exhibits normal muscle tone. She displays no seizure activity. Coordination normal.  Skin: Skin is warm and dry. No rash noted.  Psychiatric: She has a normal mood and affect.  Nursing note and vitals reviewed.   ED Course  Procedures  Medications given in the ED Medications  furosemide (LASIX) tablet 20 mg (20 mg Oral Given 02/15/16 2006)     Labs Review Labs Reviewed  BASIC METABOLIC PANEL - Abnormal; Notable for the following:    CO2 21 (*)    All other components within normal limits  CBC - Abnormal; Notable for the following:    RDW 17.0 (*)    All other components within normal limits  BRAIN NATRIURETIC PEPTIDE - Abnormal; Notable for the following:    B Natriuretic Peptide 100.5 (*)    All other components within normal limits  I-STAT TROPOININ, ED    Imaging Review Dg Chest 2 View  02/15/2016  CLINICAL DATA:  chest pain and shortness of breath for 2 weeks. EXAM: CHEST  2 VIEW COMPARISON:  05/06/2015 FINDINGS: Midline trachea. Normal heart size. Low lung volumes with resultant pulmonary interstitial prominence. Clear lungs. IMPRESSION: No acute cardiopulmonary disease. Electronically Signed   By: Abigail Miyamoto M.D.   On: 02/15/2016 16:53   I have personally reviewed and evaluated these images and lab results as part of my medical decision-making.   EKG Interpretation   Date/Time:  Wednesday February 15 2016 16:03:40  EDT Ventricular Rate:  73 PR Interval:  146 QRS Duration: 70 QT Interval:  394 QTC Calculation: 434 R Axis:   58 Text Interpretation:  Normal sinus rhythm Normal ECG No significant change  since last tracing Confirmed by Andrian Sabala  MD-J, Tikita Mabee [id number]) on 02/15/2016  7:33:13 PM      MDM   Final diagnoses:  Peripheral edema    Doubt CHF, PE, DVT based on exam, history and findings.  Bilateral sx suggestive of lymphedema.  Assymetry related to her prior surgery.  Will dc home on oral lasix and potassium.  Use compression stockings.  Follow up with her  PCP    Dorie Rank, MD 02/15/16 2114

## 2016-02-17 NOTE — Progress Notes (Signed)
   Subjective:   Kendra [name] is a 50 y.o. female with a history of obesity, OA, gerd here for leg swelling  LEG SWELLING  Having leg swelling for 14 days. Location: bilateral lower legs, L>R Timing of swelling: steady increase but then got much worse after recent road trip New medications: no History of Kidney problems: no History of Heart problems: no History of Liver problems: hemangioma History of cancer: no  Symptoms Chest pain: no Shortness of breath: no Immobility: recent road trip Black or bloody bowel movements: no Severe snoring or day time sleepiness: no Abdomen swelling: no History of blood clots: no Weight Loss: no  ROS see HPI Smoking Status noted   Objective:  BP 126/62 mmHg  Pulse 141  Temp(Src) 97.6 F (36.4 C) (Oral)  Ht 5\' 4"  (1.626 m)  Wt 300 lb 3.2 oz (136.17 kg)  BMI 51.50 kg/m2  LMP 02/21/2015 (Approximate)  Gen:  50 y.o. female in NAD HEENT: NCAT, MMM, EOMI, PERRL, anicteric sclerae CV: RRR, no MRG, no JVD Resp: Non-labored, CTAB, no wheezes noted Abd: Soft, NTND, BS present, no guarding or organomegaly Ext: WWP, 2+ pitting edema RLE, 4+ pitting edema LLE, tender on left Neuro: Alert and oriented, speech normal      Chemistry      Component Value Date/Time   NA 138 02/15/2016 1615   K 4.1 02/15/2016 1615   CL 108 02/15/2016 1615   CO2 21* 02/15/2016 1615   BUN 9 02/15/2016 1615   CREATININE 0.75 02/15/2016 1615   CREATININE 0.84 10/04/2015 1431      Component Value Date/Time   CALCIUM 10.0 02/15/2016 1615   ALKPHOS 77 10/04/2015 1431   AST 19 10/04/2015 1431   ALT 21 10/04/2015 1431   BILITOT 0.6 10/04/2015 1431      Lab Results  Component Value Date   WBC 6.1 02/15/2016   HGB 13.2 02/15/2016   HCT 41.1 02/15/2016   MCV 94.3 02/15/2016   PLT 277 02/15/2016   Lab Results  Component Value Date   TSH 0.829 06/01/2013   Lab Results  Component Value Date   HGBA1C 5.3 06/01/2013   Assessment & Plan:     Kendra [name] is a 50 y.o. female here for leg swelling  Leg swelling Bilateral LE edema x2 weeks, worse since recent road trip, suspect venous insufficiency but cannot r/o DVT - send to ED for urgent venous doppler      Beverlyn Roux, MD, MPH [facility] PGY-3 02/17/2016 2:28 PM

## 2016-02-17 NOTE — Assessment & Plan Note (Signed)
Bilateral LE edema x2 weeks, worse since recent road trip, suspect venous insufficiency but cannot r/o DVT - send to ED for urgent venous doppler

## 2016-02-22 ENCOUNTER — Ambulatory Visit (INDEPENDENT_AMBULATORY_CARE_PROVIDER_SITE_OTHER): Payer: Medicare Other | Admitting: Family Medicine

## 2016-02-22 ENCOUNTER — Encounter: Payer: Self-pay | Admitting: Family Medicine

## 2016-02-22 VITALS — BP 140/60 | HR 83 | Temp 98.4°F | Wt 294.0 lb

## 2016-02-22 DIAGNOSIS — R609 Edema, unspecified: Secondary | ICD-10-CM | POA: Diagnosis not present

## 2016-02-22 DIAGNOSIS — M7989 Other specified soft tissue disorders: Secondary | ICD-10-CM | POA: Diagnosis not present

## 2016-02-22 DIAGNOSIS — Z79899 Other long term (current) drug therapy: Secondary | ICD-10-CM

## 2016-02-22 DIAGNOSIS — R0600 Dyspnea, unspecified: Secondary | ICD-10-CM | POA: Diagnosis not present

## 2016-02-22 DIAGNOSIS — Z20828 Contact with and (suspected) exposure to other viral communicable diseases: Secondary | ICD-10-CM

## 2016-02-22 LAB — LIPID PANEL
Cholesterol: 171 mg/dL (ref 125–200)
HDL: 57 mg/dL (ref >=46)
LDL CALC: 73 mg/dL (ref <130)
TRIGLYCERIDES: 206 mg/dL — AB (ref <150)
Total CHOL/HDL Ratio: 3 Ratio (ref <=5.0)
VLDL: 41 mg/dL — AB (ref <30)

## 2016-02-22 LAB — COMPLETE METABOLIC PANEL WITH GFR
ALT: 20 U/L (ref 6–29)
AST: 20 U/L (ref 10–35)
Albumin: 4.2 g/dL (ref 3.6–5.1)
Alkaline Phosphatase: 75 U/L (ref 33–115)
BUN: 9 mg/dL (ref 7–25)
CHLORIDE: 103 mmol/L (ref 98–110)
CO2: 19 mmol/L — AB (ref 20–31)
CREATININE: 0.76 mg/dL (ref 0.50–1.10)
Calcium: 10.2 mg/dL (ref 8.6–10.2)
GFR, Est Non African American: >89 mL/min (ref >=60)
GLUCOSE: 88 mg/dL (ref 65–99)
Potassium: 4 mmol/L (ref 3.5–5.3)
SODIUM: 133 mmol/L — AB (ref 135–146)
Total Bilirubin: 0.5 mg/dL (ref 0.2–1.2)
Total Protein: 6.9 g/dL (ref 6.1–8.1)

## 2016-02-22 LAB — POCT URINALYSIS DIPSTICK
BILIRUBIN UA: NEGATIVE
GLUCOSE UA: NEGATIVE
Ketones, UA: NEGATIVE
Leukocytes, UA: NEGATIVE
Nitrite, UA: NEGATIVE
Protein, UA: NEGATIVE
UROBILINOGEN UA: 0.2
pH, UA: 5.5

## 2016-02-22 MED ORDER — ATORVASTATIN CALCIUM 40 MG PO TABS: 40.0000 mg | ORAL_TABLET | Freq: Every day | ORAL | Status: DC

## 2016-02-22 MED ORDER — ASPIRIN EC 81 MG PO TBEC: 81.0000 mg | DELAYED_RELEASE_TABLET | Freq: Every day | ORAL | Status: DC

## 2016-02-22 MED ORDER — FUROSEMIDE 20 MG PO TABS: 20.0000 mg | ORAL_TABLET | Freq: Every day | ORAL | Status: DC

## 2016-02-22 MED ORDER — LISINOPRIL 10 MG PO TABS: 10.0000 mg | ORAL_TABLET | Freq: Every day | ORAL | Status: DC

## 2016-02-22 NOTE — Progress Notes (Signed)
HPI  CC: Bilateral leg swelling Patient is here for follow-up from the ED after experiencing bilateral leg swelling. She states that the ED provider had given one week of Lasix which she just finished yesterday. She states that today is her first day without this medication and she has had her first symptoms of bilateral swelling since that ED visit. She denies any chest pain or diaphoresis. She endorses some dyspnea especially at night laying flat. She does use some pillows to help prop her up at night. She denies any polydipsia or polyuria. No recent fevers, chills, headache, dizziness, chest pain, nausea, vomiting, diarrhea, left arm pain, incontinence, numbness, paresthesias, or weakness.  Patient smokes one pack a day. She has been doing this for the past 13 years.  Review of Systems   See HPI for ROS. All other systems reviewed and are negative.  CC, SH/smoking status, and VS noted  Objective: BP 140/60 mmHg  Pulse 83  Temp(Src) 98.4 F (36.9 C) (Oral)  Wt 294 lb (133.358 kg)  SpO2 98%  LMP 02/21/2015 (Approximate) Gen: NAD, alert, cooperative, and pleasant. HEENT: NCAT, EOMI, PERRL, no JVD, no LAD. CV: RRR, no carotid, renal, or femoral bruit appreciated. Resp: CTAB, no wheezes, non-labored Ext: Warm, pitting edema (+1-2) to the mid shin bilaterally. Strength/range of motion/sensation intact throughout. Dorsalis pedis and posterior tibialis pulses symmetric and present bilaterally.   Assessment and plan:  Leg swelling Patient is here for bilateral leg swelling. Etiology currently unknown. At this time cannot rule out cardiogenic cause. Symptoms seem to improve with Lasix. Other etiologies would include renal, endocrine, and vascular causes. - We will obtain TSH, CMP, lipid panel. - UA to assess for proteinuria. - Echocardiogram - Continue Lasix daily. - Initiate lisinopril for low range hypertension - Begin atorvastatin and daily baby aspirin    Orders Placed This  Encounter  Procedures  . TSH  . COMPLETE METABOLIC PANEL WITH GFR  . Lipid panel  . HIV antibody  . POCT urinalysis dipstick  . ECHO COMPLETE    Standing Status: Future     Number of Occurrences:      Standing Expiration Date: 05/24/2017    Order Specific Question:  Where should this test be performed    Answer:  [facility])    Order Specific Question:  Does the patient weigh less than or greater than 250 lbs?    Answer:  Patient weighs greater than 250 lbs    Order Specific Question:  Complete or Limited study?    Answer:  Complete    Order Specific Question:  Does the patient have a known history of hypersensitivity to Perflutren (aka Scientist, research (medical) for echocardiograms - CHECK ALLERGIES)    Answer:  No    Order Specific Question:  ADMINISTER PERFLUTERN    Answer:  ADMINISTER PERFLUTREN    Order Specific Question:  Expected Date:    Answer:  1 week    Order Specific Question:  Reason for exam-Echo    Answer:  Dyspnea  786.09 / R06.00    Meds ordered this encounter  Medications  . lisinopril (PRINIVIL,ZESTRIL) 10 MG tablet    Sig: Take 1 tablet (10 mg total) by mouth daily.    Dispense:  30 tablet    Refill:  1  . furosemide (LASIX) 20 MG tablet    Sig: Take 1 tablet (20 mg total) by mouth daily.    Dispense:  30 tablet    Refill:  1  .  atorvastatin (LIPITOR) 40 MG tablet    Sig: Take 1 tablet (40 mg total) by mouth daily.    Dispense:  90 tablet    Refill:  3  . aspirin EC 81 MG tablet    Sig: Take 1 tablet (81 mg total) by mouth daily.    Dispense:  90 tablet    Refill:  prn     Elberta Leatherwood, MD,MS,  PGY2 02/22/2016 7:07 PM

## 2016-02-22 NOTE — Patient Instructions (Signed)
It was a pleasure seeing you today in our clinic. Today we discussed your leg swelling. Here is the treatment plan we have discussed and agreed upon together:   - Continue taking 1 tablet of Lasix every day. I have sent a new prescription that will last you the next month. - I have started you on lisinopril. Take this once a day. This medication will help with her blood pressure as well as your heart and kidney health. - I have started you on atorvastatin. Take this medication once a day. This medication will help with your cholesterol. - I have also sent a prescription in for a baby aspirin. Take one of these every day. This medication helps with heart health. - I've set you up for an echocardiogram to be obtained. I would like to see you within a week of this procedure. Please set up a follow-up appointment at that time. At that follow-up appointment you and I will discuss today's lab results as well as the results from the echocardiogram.

## 2016-02-22 NOTE — Assessment & Plan Note (Signed)
Patient is here for bilateral leg swelling. Etiology currently unknown. At this time cannot rule out cardiogenic cause. Symptoms seem to improve with Lasix. Other etiologies would include renal, endocrine, and vascular causes. - We will obtain TSH, CMP, lipid panel. - UA to assess for proteinuria. - Echocardiogram - Continue Lasix daily. - Initiate lisinopril for low range hypertension - Begin atorvastatin and daily baby aspirin

## 2016-02-23 LAB — TSH: TSH: 1.11 m[IU]/L

## 2016-02-23 LAB — HIV ANTIBODY (ROUTINE TESTING W REFLEX): HIV: NONREACTIVE

## 2016-03-02 ENCOUNTER — Ambulatory Visit (HOSPITAL_COMMUNITY): Payer: Medicare Other | Attending: Cardiovascular Disease

## 2016-03-02 ENCOUNTER — Other Ambulatory Visit: Payer: Self-pay

## 2016-03-02 DIAGNOSIS — I517 Cardiomegaly: Secondary | ICD-10-CM | POA: Insufficient documentation

## 2016-03-02 DIAGNOSIS — R0600 Dyspnea, unspecified: Secondary | ICD-10-CM | POA: Diagnosis not present

## 2016-03-02 DIAGNOSIS — R600 Localized edema: Secondary | ICD-10-CM

## 2016-03-02 DIAGNOSIS — R609 Edema, unspecified: Secondary | ICD-10-CM

## 2016-03-02 DIAGNOSIS — Z6841 Body Mass Index (BMI) 40.0 and over, adult: Secondary | ICD-10-CM | POA: Diagnosis not present

## 2016-03-02 DIAGNOSIS — R0609 Other forms of dyspnea: Secondary | ICD-10-CM | POA: Insufficient documentation

## 2016-03-02 LAB — ECHOCARDIOGRAM COMPLETE
CHL CUP DOP CALC LVOT VTI: 26.1 cm
E/e' ratio: 5.45
EWDT: 201 ms
FS: 43 % (ref 28–44)
IV/PV OW: 1.24
IVRT: 95 msec
LA ID, A-P, ES: 34 mm
LA vol A4C: 22.5 ml
LA vol index: 11.4 mL/m2
LADIAMINDEX: 1.48 cm/m2
LAVOL: 26.2 mL
LDCA: 2.84 cm2
LEFT ATRIUM END SYS DIAM: 34 mm
LV TDI E'LATERAL: 13.7
LV TDI E'MEDIAL: 9.14
LVEEAVG: 5.45
LVEEMED: 5.45
LVELAT: 13.7 cm/s
LVOT diameter: 19 mm
LVOT peak grad rest: 6 mmHg
LVOTPV: 120 cm/s
LVOTSV: 74 mL
MV Dec: 201
MV pk E vel: 74.7 m/s
MVPG: 2 mmHg
MVPKAVEL: 67.9 m/s
PW: 10.1 mm — AB (ref 0.6–1.1)
RV LATERAL S' VELOCITY: 9.79 cm/s
RVOTPV: 67 m/s
TAPSE: 16 mm

## 2016-03-16 ENCOUNTER — Other Ambulatory Visit (HOSPITAL_COMMUNITY): Payer: Medicare Other

## 2016-04-02 ENCOUNTER — Ambulatory Visit (INDEPENDENT_AMBULATORY_CARE_PROVIDER_SITE_OTHER): Payer: Medicare Other | Admitting: Internal Medicine

## 2016-04-02 VITALS — BP 119/75 | HR 85 | Temp 98.3°F | Wt 292.8 lb

## 2016-04-02 DIAGNOSIS — I10 Essential (primary) hypertension: Secondary | ICD-10-CM

## 2016-04-02 DIAGNOSIS — T783XXA Angioneurotic edema, initial encounter: Secondary | ICD-10-CM

## 2016-04-02 DIAGNOSIS — R220 Localized swelling, mass and lump, head: Secondary | ICD-10-CM | POA: Diagnosis not present

## 2016-04-02 MED ORDER — AMLODIPINE BESYLATE 5 MG PO TABS: 5.0000 mg | ORAL_TABLET | Freq: Every day | ORAL | 0 refills | Status: DC

## 2016-04-02 NOTE — Progress Notes (Signed)
[facility] Progress Note  Subjective:  Kendra [name] is a 50-y/o female who was recently started on lisinopril (02/22/16) who presents for lip swelling.  Lip swelling: - Woke up around 0200 this morning with lip swelling on the right side of her mouth - Later this morning entire lip had become swollen and felt tight - Pt says lip has since gone down some  - Pt had taken double her medications (lasix 20 mg x 2; lisinopril 10 mg x 2) on Sunday because she had missed Saturday's doses - Denies SOB - Denies trouble swallowing - No known bites or known allergies ROS: no rash or itching, no chest pain  Social: Current smoker  Objective: Blood pressure 119/75, pulse 85, temperature 98.3 F (36.8 C), temperature source Oral, weight 292 lb 12.8 oz (132.8 kg), last menstrual period 02/21/2015, SpO2 100 %. Constitutional: Obese, pleasant female, in NAD but with obvious facial swelling HENT: Upper lip swollen, normal oropharynx  Cardiovascular: RRR, S1, S2, no m/r/g.  Pulmonary/Chest: Effort normal and breath sounds normal. No wheezes. No respiratory distress.  Abdominal: Soft. +BS, NT, ND, no rebound or guarding.  Musculoskeletal: No LE edema.  Neurological: AOx3, no focal deficits. Skin: Skin is warm and dry. No erythema.  Psychiatric: Normal mood and somewhat flat affect.  Vitals reviewed   The above was taken around 0930.    The above was taken around 1415.   Assessment/Plan: Swelling of upper lip - Suspect ACEi-induced angioedema - Patient was watched in the office for over 5 hours with improvement of lip swelling and no signs of respiratory distress - Discontinued lisinopril and placed on patient's allergy list - Gave patient strict return precautions  HTN (hypertension) - Started patient on amlodipine 5 mg as substitute for lisinopril  Follow-up in about 1 week to monitor BP on amlodipine.  Olene Floss, MD [place], PGY-2

## 2016-04-02 NOTE — Patient Instructions (Signed)
Ms. Fiss,  You seemed to have angioedema from lisinopril. STOP taking this medicine. Please start amlodipine 5 mg daily. Please make an appointment for blood pressure follow-up in about 1 week.  Best, Dr. Ola Spurr

## 2016-04-03 ENCOUNTER — Encounter: Payer: Self-pay | Admitting: Internal Medicine

## 2016-04-03 DIAGNOSIS — I10 Essential (primary) hypertension: Secondary | ICD-10-CM | POA: Insufficient documentation

## 2016-04-03 DIAGNOSIS — R220 Localized swelling, mass and lump, head: Secondary | ICD-10-CM | POA: Insufficient documentation

## 2016-04-03 NOTE — Assessment & Plan Note (Signed)
-   Started patient on amlodipine 5 mg as substitute for lisinopril

## 2016-04-03 NOTE — Assessment & Plan Note (Signed)
-   Suspect ACEi-induced angioedema - Patient was watched in the office for over 5 hours with improvement of lip swelling and no signs of respiratory distress - Discontinued lisinopril and placed on patient's allergy list - Gave patient strict return precautions

## 2016-05-08 ENCOUNTER — Other Ambulatory Visit: Payer: Self-pay | Admitting: Sports Medicine

## 2016-05-20 ENCOUNTER — Other Ambulatory Visit: Payer: Self-pay | Admitting: Family Medicine

## 2016-05-20 ENCOUNTER — Other Ambulatory Visit: Payer: Self-pay | Admitting: Internal Medicine

## 2016-05-20 DIAGNOSIS — I10 Essential (primary) hypertension: Secondary | ICD-10-CM

## 2016-05-22 ENCOUNTER — Other Ambulatory Visit: Payer: Self-pay | Admitting: Student

## 2016-05-22 DIAGNOSIS — I10 Essential (primary) hypertension: Secondary | ICD-10-CM

## 2016-07-11 ENCOUNTER — Ambulatory Visit (INDEPENDENT_AMBULATORY_CARE_PROVIDER_SITE_OTHER): Payer: Medicare Other | Admitting: Sports Medicine

## 2016-07-11 VITALS — BP 126/70 | Ht 64.0 in | Wt 289.0 lb

## 2016-07-11 DIAGNOSIS — M25511 Pain in right shoulder: Secondary | ICD-10-CM

## 2016-07-11 DIAGNOSIS — G8929 Other chronic pain: Secondary | ICD-10-CM

## 2016-07-11 DIAGNOSIS — M19019 Primary osteoarthritis, unspecified shoulder: Secondary | ICD-10-CM | POA: Diagnosis not present

## 2016-07-11 MED ORDER — MELOXICAM 15 MG PO TABS: 15.0000 mg | ORAL_TABLET | Freq: Every day | ORAL | 2 refills | Status: DC | PRN

## 2016-07-11 MED ORDER — TRAMADOL HCL 50 MG PO TABS: 50.0000 mg | ORAL_TABLET | Freq: Two times a day (BID) | ORAL | 1 refills | Status: DC | PRN

## 2016-07-11 NOTE — Progress Notes (Signed)
   Subjective:    Patient ID: Kendra [name], female    DOB: 19-Mar-1966, 50 y.o.   MRN: [id number]  HPI chief complaint: Right shoulder pain  Patient comes in today complaining of returning right shoulder pain. 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 An MRI scan of her right shoulder done in January 2016 showed age advanced glenohumeral degenerative changes and a moderate size joint effusion. A subsequent intra-articular cortisone injection alleviated her pain up until a few weeks ago. Pain has now returned and is identical in nature to what she experienced previously. It is a diffuse aching pain throughout the shoulder. She will get some radiation of pain down the arm. Some nighttime pain as well. She takes tramadol which helps temporarily. She is not currently on any sort of NSAID. She denies fevers and chills.    Review of Systems    as above Objective:   Physical Exam  Well-developed, well-nourished. No acute distress. Awake alert and oriented 3.  Right shoulder: Patient has fairly good active and passive range of motion but does have pain at the extremes of overhead reaching and abduction. She does have generalized weakness secondary to her pain but nothing focal. No erythema. No edema. No tenderness to palpation. Neurovascularly intact distally.      Assessment & Plan:   Returning right shoulder pain secondary to glenohumeral DJD  Patient will be referred to [facility] for a repeat intra-articular cortisone injection. If symptoms persist thereafter then I would start with getting updated x-rays of her right shoulder. Definitive treatment is a total shoulder replacement but she is not ready for that yet. I've refilled her tramadol and I've given her a prescription for meloxicam to take 15 mg daily as needed. Review of her chart does not show any contraindications to NSAIDs. Follow-up with me as needed.

## 2016-07-12 ENCOUNTER — Other Ambulatory Visit: Payer: Self-pay | Admitting: Sports Medicine

## 2016-07-12 DIAGNOSIS — M25511 Pain in right shoulder: Principal | ICD-10-CM

## 2016-07-12 DIAGNOSIS — G8929 Other chronic pain: Secondary | ICD-10-CM

## 2016-08-10 ENCOUNTER — Ambulatory Visit (HOSPITAL_COMMUNITY)
Admission: RE | Admit: 2016-08-10 | Discharge: 2016-08-10 | Disposition: A | Discharge status: Home / Self Care | Payer: Medicare Other | Source: Ambulatory Visit | Attending: Family Medicine | Admitting: Family Medicine

## 2016-08-10 DIAGNOSIS — R079 Chest pain, unspecified: Secondary | ICD-10-CM | POA: Insufficient documentation

## 2016-08-10 DIAGNOSIS — R9431 Abnormal electrocardiogram [ECG] [EKG]: Secondary | ICD-10-CM | POA: Diagnosis not present

## 2016-08-13 ENCOUNTER — Ambulatory Visit
Admission: RE | Admit: 2016-08-13 | Discharge: 2016-08-13 | Disposition: A | Discharge status: Home / Self Care | Payer: Medicare Other | Source: Ambulatory Visit | Attending: Sports Medicine | Admitting: Sports Medicine

## 2016-08-13 DIAGNOSIS — M25511 Pain in right shoulder: Secondary | ICD-10-CM | POA: Diagnosis not present

## 2016-08-13 DIAGNOSIS — G8929 Other chronic pain: Secondary | ICD-10-CM

## 2016-08-13 MED ORDER — METHYLPREDNISOLONE ACETATE 40 MG/ML INJ SUSP (RADIOLOG
120.0000 mg | Freq: Once | INTRAMUSCULAR | Status: AC
  Administered 2016-08-13: 120 mg via INTRA_ARTICULAR

## 2016-08-13 MED ORDER — IOPAMIDOL (ISOVUE-M 200) INJECTION 41%
1.0000 mL | Freq: Once | INTRAMUSCULAR | Status: AC
  Administered 2016-08-13: 1 mL via INTRA_ARTICULAR

## 2016-09-12 ENCOUNTER — Other Ambulatory Visit: Payer: Self-pay | Admitting: Family Medicine

## 2016-10-10 ENCOUNTER — Telehealth: Payer: Self-pay

## 2016-10-10 NOTE — Telephone Encounter (Signed)
Needs muscle relaxer filled but can't find the bottle to get a refill. Will you please send in a request to refill the med. Ottis Stain, CMA

## 2016-11-13 ENCOUNTER — Telehealth: Payer: Self-pay | Admitting: Family Medicine

## 2016-11-13 NOTE — Telephone Encounter (Signed)
I dont see anything in chart about patient ever having a home visit, will forward to MD to see if he knows anything about this.

## 2016-11-13 NOTE — Telephone Encounter (Signed)
Pt is requesting a dr to make a home visit for her annual wellnes visits.  This is a once year visti done in her home by the dr. Please Kendra [name]

## 2016-12-14 ENCOUNTER — Telehealth: Payer: Self-pay | Admitting: Family Medicine

## 2016-12-14 ENCOUNTER — Telehealth: Payer: Self-pay | Admitting: Internal Medicine

## 2016-12-14 NOTE — Telephone Encounter (Signed)
[facility] After Hours Telephone Line   Number received via page: Kendra [name] Person calling: [phone] Reason for call: She calling after-hours line stating that she has terrible cramping in her lower abdomen. This has been going on for several weeks. She also notes having nausea and vomiting for several months. She denies any fevers. She denies pain with urination however indicates that she has to hold her stomach when she urinates. She states that the pain is specifically over her scars from her her hysterectomy. She feels like she is having a period. I indicated that if she is having severe abdominal pain, she should either go to an urgent care or the ED to be evaluated for this issue. I reassured her that her Medicaid would cover the visit   [place] PGY-2 [place]

## 2016-12-14 NOTE — Telephone Encounter (Signed)
Pt has been having stomach cramps for over a week. It is like her period is coming on but she had a hysterectomy. Please advise

## 2016-12-17 NOTE — Telephone Encounter (Signed)
I can't do much w/o seeing her. Please schedule an appt.

## 2016-12-21 ENCOUNTER — Ambulatory Visit: Payer: Medicare Other | Admitting: Family Medicine

## 2016-12-24 ENCOUNTER — Encounter: Payer: Self-pay | Admitting: Student

## 2016-12-24 ENCOUNTER — Ambulatory Visit (INDEPENDENT_AMBULATORY_CARE_PROVIDER_SITE_OTHER): Payer: Medicare Other | Admitting: Student

## 2016-12-24 DIAGNOSIS — R109 Unspecified abdominal pain: Secondary | ICD-10-CM | POA: Diagnosis not present

## 2016-12-24 NOTE — Assessment & Plan Note (Signed)
Pain occurs exclusively after holding her urine for a prolonged period of time. No other urinary complaints. Less likely UTI, has a history of total hysterectomy - patient advised to not hold her urine for prolonged periods of time as much as possible - reduce drinking before bed - follow with PCP as needed

## 2016-12-24 NOTE — Progress Notes (Signed)
   Subjective:    Patient ID: Kendra [name], female    DOB: Apr 01, 1966, 51 y.o.   MRN: [id number]   CC: stomach pain after holding her urine for prolonged amount of  time  HPI: 51 y/o F presents for stomach pain when holing urine for a prolonged amount of time  Stomach pain - located over lower abdomen - occurs only after holing urine for a long time, especially upon waking if she has to use the bathroom - resolved right after urinating - no pain with urination - no fevers - this issues has been going on for several months - no abdominal pain right now  Smoking status reviewed  Review of Systems  Per HPI, else denies chest pain, shortness of breath,     Objective:  BP 132/78   Pulse 92   Temp 98.2 F (36.8 C) (Oral)   Wt 296 lb (134.3 kg)   LMP 02/21/2015 (Approximate)   SpO2 94%   BMI 50.81 kg/m  Vitals and nursing note reviewed  General: NAD Cardiac: RRR,  Respiratory: CTAB, normal effort Abdomen: obese soft, nontender Extremities: no edema or cyanosis. WWP. Skin: warm and dry, no rashes noted Neuro: alert and oriented, no focal deficits   Assessment & Plan:    Abdominal pain Pain occurs exclusively after holding her urine for a prolonged period of time. No other urinary complaints. Less likely UTI, has a history of total hysterectomy - patient advised to not hold her urine for prolonged periods of time as much as possible - reduce drinking before bed - follow with PCP as needed    Xzandria Clevinger A. Lincoln Brigham MD, [facility] Resident PGY-3 Pager [phone]

## 2016-12-24 NOTE — Patient Instructions (Signed)
Follow up with your primary care provider as needed If you have questions or concerns, call the office at [phone]

## 2017-01-02 NOTE — Telephone Encounter (Signed)
pt scheduled to see dr. Alease Frame Friday. Shelia Magallon Kennon Holter, CMA

## 2017-01-03 ENCOUNTER — Ambulatory Visit (INDEPENDENT_AMBULATORY_CARE_PROVIDER_SITE_OTHER): Payer: Medicare Other | Admitting: Sports Medicine

## 2017-01-03 DIAGNOSIS — S76319A Strain of muscle, fascia and tendon of the posterior muscle group at thigh level, unspecified thigh, initial encounter: Secondary | ICD-10-CM

## 2017-01-03 DIAGNOSIS — M25561 Pain in right knee: Secondary | ICD-10-CM

## 2017-01-03 MED ORDER — METHYLPREDNISOLONE ACETATE 80 MG/ML IJ SUSP
80.0000 mg | Freq: Once | INTRAMUSCULAR | Status: AC
  Administered 2017-01-03: 80 mg via INTRAMUSCULAR

## 2017-01-03 NOTE — Addendum Note (Signed)
Addended by: Cyd Silence on: 01/03/2017 03:02 PM   Modules accepted: Orders

## 2017-01-03 NOTE — Progress Notes (Signed)
   Subjective:    Patient ID: Kendra [name], female    DOB: 1966/01/20, 51 y.o.   MRN: [id number]  HPI chief complaint: Right hip pain  Patient comes in today complaining of posterior right hip and thigh pain that began one month ago after she slipped in her kitchen. She fell suffering a hyperflexion injury of the right knee and hyperextension injury of the right hip. She felt a pop in the mid substance of her hamstring and has had pain ever since. She denies any bruising. She has not noticed any swelling. She has pain both with activity as well as with sitting. She denies groin pain. No numbness or tingling. No similar problems in the past.  Interim medical history reviewed Medications reviewed Allergies reviewed    Review of Systems    as above Objective:   Physical Exam  Obese. No acute distress  Right hip: Smooth painless hip range of motion with a negative logroll. Patient is tender to palpation along the mid substance of the hamstring muscle bellies. Fairly good strength. No palpable defect. Tendon appears to be intact proximally. Neurovascularly intact distally. Walking with only a mild limp.      Assessment & Plan:   Right hip pain secondary to hamstring strain  Reassurance regarding her diagnosis. Her pain should improve over the next several weeks. I've given her an Ace wrap for compression to wear for comfort and she will follow-up with me again in 4 weeks. Call with questions or concerns in the interim.

## 2017-01-04 ENCOUNTER — Ambulatory Visit: Payer: Medicare Other | Admitting: Family Medicine

## 2017-01-04 ENCOUNTER — Other Ambulatory Visit: Payer: Self-pay | Admitting: *Deleted

## 2017-01-04 MED ORDER — DICLOFENAC SODIUM 75 MG PO TBEC: 75.0000 mg | DELAYED_RELEASE_TABLET | Freq: Every day | ORAL | 1 refills | Status: DC

## 2017-01-14 ENCOUNTER — Ambulatory Visit: Payer: Medicare Other | Admitting: Family Medicine

## 2017-01-28 ENCOUNTER — Ambulatory Visit: Payer: Medicare Other | Admitting: Family Medicine

## 2017-02-04 ENCOUNTER — Ambulatory Visit: Payer: Medicare Other | Admitting: Sports Medicine

## 2017-02-04 ENCOUNTER — Other Ambulatory Visit: Payer: Self-pay | Admitting: *Deleted

## 2017-02-04 DIAGNOSIS — M25511 Pain in right shoulder: Principal | ICD-10-CM

## 2017-02-04 DIAGNOSIS — G8929 Other chronic pain: Secondary | ICD-10-CM

## 2017-02-04 MED ORDER — TRAMADOL HCL 50 MG PO TABS: 50.0000 mg | ORAL_TABLET | Freq: Two times a day (BID) | ORAL | 1 refills | Status: DC | PRN

## 2017-02-18 ENCOUNTER — Ambulatory Visit (INDEPENDENT_AMBULATORY_CARE_PROVIDER_SITE_OTHER): Payer: Medicare Other | Admitting: Family Medicine

## 2017-02-18 ENCOUNTER — Encounter: Payer: Self-pay | Admitting: Family Medicine

## 2017-02-18 VITALS — BP 120/82 | HR 78 | Temp 98.1°F | Wt 292.6 lb

## 2017-02-18 DIAGNOSIS — M542 Cervicalgia: Secondary | ICD-10-CM | POA: Diagnosis not present

## 2017-02-18 DIAGNOSIS — Z79899 Other long term (current) drug therapy: Secondary | ICD-10-CM | POA: Insufficient documentation

## 2017-02-18 MED ORDER — ATORVASTATIN CALCIUM 40 MG PO TABS: 40.0000 mg | ORAL_TABLET | Freq: Every day | ORAL | 3 refills | Status: DC

## 2017-02-18 MED ORDER — GABAPENTIN 100 MG PO CAPS: 100.0000 mg | ORAL_CAPSULE | Freq: Three times a day (TID) | ORAL | 3 refills | Status: DC

## 2017-02-18 MED ORDER — LISINOPRIL 10 MG PO TABS: 10.0000 mg | ORAL_TABLET | Freq: Every day | ORAL | 3 refills | Status: DC

## 2017-02-18 MED ORDER — ASPIRIN EC 81 MG PO TBEC: 81.0000 mg | DELAYED_RELEASE_TABLET | Freq: Every day | ORAL | 99 refills | Status: DC

## 2017-02-18 NOTE — Patient Instructions (Signed)
It was a pleasure seeing you today in our clinic. Today we discussed your neck pain and medications. Here is the treatment plan we have discussed and agreed upon together:   - I would like to have you see your dentist to discuss some of the enamel breakdown of the teeth on your left side. I believe that this may be a direct cause for some of the pain and discomfort you're having on that side of your face and upper neck. - Today we will get some blood tests on you to make sure that your medications are still appropriate. - If you have any worsening in symptoms to not hesitate to make a follow-up appointment for reevaluation.   Aleve (naproxen) and Tylenol: For most musculoskeletal pain I tell patients to alternate taking Aleve/naproxen and Tylenol. Begin taking the Aleve/naproxen 2 times a day (scheduled), then take the Tylenol inbetween each of the tylenol dosings as needed for additional pain (this allows for Tylenol to be taken up to 3 times a day).  Dosing: - 440-1000mg  of Aleve/naproxen 2 times a day (max dose).  - 650-1000mg  of Tylenol 3 times a day (max dose).

## 2017-02-18 NOTE — Assessment & Plan Note (Signed)
Patient is on a handful of high risk medications. She has not had any blood work in over a year. - Labs obtained today. We'll mail patient the results.

## 2017-02-18 NOTE — Progress Notes (Signed)
   HPI  CC: Left-sided neck pain 2 weeks neck pain. Left neck. Ear to shoulders. Worse w/ laying down (on any side). No event that could have caused this. Pain is sharp. Minimal pain w/ chewing. Nothing makes better. No dizziness or hearing problems.  ROS: Denies fever, chills, vision changes, hearing deficits, dizziness, vertigo, cough, rhinorrhea, chest pain, shortness of breath, nausea, vomiting, diarrhea, dysuria, weakness, or numbness.  CC, SH/smoking status, and VS noted  Objective: BP 120/82   Pulse 78   Temp 98.1 F (36.7 C) (Oral)   Wt 292 lb 9.6 oz (132.7 kg)   LMP 02/21/2015 (Approximate)   SpO2 95%   BMI 50.22 kg/m  Gen: NAD, alert, cooperative, and pleasant. HEENT: NCAT, EOMI, PERRL, TMs clear, OP clear, dentition poor w/ left sided caries and possible molar integrity compromise on left lower side. Discomfort noted with jaw clenching. No obvious masses or areas of swelling/erythema. Neck full ROM. No LAD. MMM. CV: RRR, no murmur Resp: CTAB, no wheezes, non-labored Ext: No edema, warm Neuro: Alert and oriented, Speech clear, No gross deficits   Assessment and plan:  Neck pain on left side Patient is here with complaints of left-sided neck pain. Signs and symptoms are most consistent with did peridontal infection versus loss of tooth integrity (caries). No red flag symptoms at this time. No evidence of ear or mastoid involvement. Neck full ROM - Encouraged making early dental appointment. - Soft foods and adequate hydration - Regular dental hygiene care - Avoidance of thick chewy and sugary foods - NSAIDs for discomfort. - Return precautions discussed  High risk medication use Patient is on a handful of high risk medications. She has not had any blood work in over a year. - Labs obtained today. We'll mail patient the results.   Orders Placed This Encounter  Procedures  . Basic metabolic panel  . CBC  . Lipid panel    Meds ordered this encounter    Medications  . gabapentin (NEURONTIN) 100 MG capsule    Sig: Take 1 capsule (100 mg total) by mouth 3 (three) times daily.    Dispense:  90 capsule    Refill:  3  . atorvastatin (LIPITOR) 40 MG tablet    Sig: Take 1 tablet (40 mg total) by mouth daily.    Dispense:  90 tablet    Refill:  3  . aspirin EC 81 MG tablet    Sig: Take 1 tablet (81 mg total) by mouth daily.    Dispense:  90 tablet    Refill:  prn  . lisinopril (PRINIVIL,ZESTRIL) 10 MG tablet    Sig: Take 1 tablet (10 mg total) by mouth daily.    Dispense:  90 tablet    Refill:  3     Elberta Leatherwood, MD,MS,  PGY3 02/18/2017 7:20 PM

## 2017-02-18 NOTE — Assessment & Plan Note (Signed)
Patient is here with complaints of left-sided neck pain. Signs and symptoms are most consistent with did peridontal infection versus loss of tooth integrity (caries). No red flag symptoms at this time. No evidence of ear or mastoid involvement. Neck full ROM - Encouraged making early dental appointment. - Soft foods and adequate hydration - Regular dental hygiene care - Avoidance of thick chewy and sugary foods - NSAIDs for discomfort. - Return precautions discussed

## 2017-02-19 LAB — CBC
HEMATOCRIT: 44.8 % (ref 34.0–46.6)
HEMOGLOBIN: 15.5 g/dL (ref 11.1–15.9)
MCH: 34.8 pg — AB (ref 26.6–33.0)
MCHC: 34.6 g/dL (ref 31.5–35.7)
MCV: 101 fL — ABNORMAL HIGH (ref 79–97)
Platelets: 277 10*3/uL (ref 150–379)
RBC: 4.45 x10E6/uL (ref 3.77–5.28)
RDW: 13.7 % (ref 12.3–15.4)
WBC: 5.8 10*3/uL (ref 3.4–10.8)

## 2017-02-19 LAB — BASIC METABOLIC PANEL
BUN/Creatinine Ratio: 12 (ref 9–23)
BUN: 9 mg/dL (ref 6–24)
CALCIUM: 10.4 mg/dL — AB (ref 8.7–10.2)
CHLORIDE: 106 mmol/L (ref 96–106)
CO2: 19 mmol/L — AB (ref 20–29)
Creatinine, Ser: 0.78 mg/dL (ref 0.57–1.00)
GFR, EST AFRICAN AMERICAN: 103 mL/min/{1.73_m2} (ref >59)
GFR, EST NON AFRICAN AMERICAN: 89 mL/min/{1.73_m2} (ref >59)
Glucose: 93 mg/dL (ref 65–99)
Potassium: 4.4 mmol/L (ref 3.5–5.2)
Sodium: 140 mmol/L (ref 134–144)

## 2017-02-19 LAB — LIPID PANEL
CHOL/HDL RATIO: 3.6 ratio (ref 0.0–4.4)
CHOLESTEROL TOTAL: 175 mg/dL (ref 100–199)
HDL: 49 mg/dL (ref >39)
LDL Calculated: 78 mg/dL (ref 0–99)
TRIGLYCERIDES: 239 mg/dL — AB (ref 0–149)
VLDL CHOLESTEROL CAL: 48 mg/dL — AB (ref 5–40)

## 2017-02-22 ENCOUNTER — Encounter: Payer: Self-pay | Admitting: Family Medicine

## 2017-02-25 ENCOUNTER — Ambulatory Visit: Payer: Medicare Other | Admitting: Sports Medicine

## 2017-03-01 ENCOUNTER — Telehealth: Payer: Self-pay

## 2017-03-01 NOTE — Telephone Encounter (Signed)
Pt would test results. Please call pt @ [phone]. Ottis Stain, CMA

## 2017-03-01 NOTE — Telephone Encounter (Signed)
Called patient to discuss her lab results. Suggested we recheck her lipid panel in a couple months (fasting) as her triglycerides were high. Advised avoiding high carbohydrate meals and limiting sugar intake. Otherwise nothing remarkable on her labs. All questions were answered.   Smitty Cords, MD [place], PGY-3

## 2017-03-07 ENCOUNTER — Ambulatory Visit
Admission: RE | Admit: 2017-03-07 | Discharge: 2017-03-07 | Disposition: A | Discharge status: Home / Self Care | Payer: Medicare Other | Source: Ambulatory Visit | Attending: Sports Medicine | Admitting: Sports Medicine

## 2017-03-07 ENCOUNTER — Ambulatory Visit (INDEPENDENT_AMBULATORY_CARE_PROVIDER_SITE_OTHER): Payer: Medicare Other | Admitting: Sports Medicine

## 2017-03-07 VITALS — BP 131/68 | Ht 64.0 in | Wt 296.0 lb

## 2017-03-07 DIAGNOSIS — L989 Disorder of the skin and subcutaneous tissue, unspecified: Secondary | ICD-10-CM | POA: Diagnosis not present

## 2017-03-07 DIAGNOSIS — M25551 Pain in right hip: Secondary | ICD-10-CM

## 2017-03-07 DIAGNOSIS — R102 Pelvic and perineal pain: Secondary | ICD-10-CM | POA: Diagnosis not present

## 2017-03-08 NOTE — Progress Notes (Signed)
   Subjective:    Patient ID: Kendra [name], female    DOB: 1965-12-03, 51 y.o.   MRN: [id number]  HPI   Patient comes in today complaining of persistent posterior right hip pain. She suffered an injury to her right hip/leg back in April when she slipped in her kitchen. It sounds like she suffered a hyperflexion injury to the knee and hyperextension injury to her hip. Her main complaint is pain with sitting. No numbness or tingling. No imaging has been done. She denies groin pain.    Review of Systems As above    Objective:   Physical Exam  Obese. No acute distress  Right hip: Smooth painless hip range of motion with a negative logroll. She is tender to palpation at the proximal hamstring and at the ischial tuberosity. There is no gross visual abnormality but her body habitus makes it difficult to evaluate for possible hamstring tear or rupture. She has weakness secondary to pain. Neurovascularly intact distally. Walking with a slight limp.  X-rays of the right hip unremarkable      Assessment & Plan:   Persistent posterior right hip pain-rule out proximal hamstring tear  MRI of the pelvis specifically to rule out a proximal right hamstring tear. Phone follow-up with those results once available. We will delineate further treatment based on those findings.  Of note, patient was also complaining of pain between her fourth and fifth toes on her right foot. Physical exam reveals a small lesion growing off the lateral aspect of her fifth toe. I will refer her to podiatry for consideration of lesion removal.

## 2017-03-20 ENCOUNTER — Ambulatory Visit
Admission: RE | Admit: 2017-03-20 | Discharge: 2017-03-20 | Disposition: A | Discharge status: Home / Self Care | Payer: Medicare Other | Source: Ambulatory Visit | Attending: Sports Medicine | Admitting: Sports Medicine

## 2017-03-20 DIAGNOSIS — M25551 Pain in right hip: Secondary | ICD-10-CM | POA: Diagnosis not present

## 2017-03-20 DIAGNOSIS — H04123 Dry eye syndrome of bilateral lacrimal glands: Secondary | ICD-10-CM | POA: Diagnosis not present

## 2017-03-26 ENCOUNTER — Telehealth: Payer: Self-pay | Admitting: Sports Medicine

## 2017-03-26 NOTE — Telephone Encounter (Signed)
I spoke with Kendra [name] on the phone today after reviewing the MRI of her pelvis. She has a low-grade partial tear of the hamstring origin on the right. She is reassured that this should eventually heal. We could consider an IM Depo-Medrol cortisone injection for pain relief if needed. Follow-up for ongoing or recalcitrant issues.

## 2017-03-27 ENCOUNTER — Ambulatory Visit (INDEPENDENT_AMBULATORY_CARE_PROVIDER_SITE_OTHER): Payer: Medicare Other | Admitting: Podiatry

## 2017-03-27 ENCOUNTER — Encounter: Payer: Self-pay | Admitting: Podiatry

## 2017-03-27 DIAGNOSIS — L84 Corns and callosities: Secondary | ICD-10-CM | POA: Diagnosis not present

## 2017-03-27 DIAGNOSIS — M659 Synovitis and tenosynovitis, unspecified: Secondary | ICD-10-CM | POA: Diagnosis not present

## 2017-03-29 MED ORDER — BETAMETHASONE SOD PHOS & ACET 6 (3-3) MG/ML IJ SUSP: 3.0000 mg | Freq: Once | INTRAMUSCULAR | Status: DC

## 2017-03-29 NOTE — Progress Notes (Signed)
   Subjective:  Patient presents today for pain and tenderness to the left ankle. Patient relates significant pain and tenderness when walking. Patient is a history of a left ankle fracture back in the 1980s. Patient underwent surgical ORIF. Patient presents for further treatment and evaluation. Patient also complains of the painful symptomatic callus lesion between her fourth and fifth toes of the right foot. Patient denies trauma and she states that slowly developed. Very painful to walk on. Painful and closed toe shoes.  Objective / Physical Exam:  General:  The patient is alert and oriented x3 in no acute distress. Dermatology:  Skin is warm, dry and supple bilateral lower extremities. Negative for open lesions. There is a hyperkeratotic soft core noted in the fourthinterdigital webspaceright foot consistent with a Heloma molle Vascular:  Palpable pedal pulses bilaterally. No edema or erythema noted. Capillary refill within normal limits. Neurological:  Epicritic and protective threshold grossly intact bilaterally.  Musculoskeletal Exam:  Pain on palpation to the anterior lateral medial aspects of the patient's left ankle. Mild edema noted.  Range of motion within normal limits to all pedal and ankle joints bilateral. Muscle strength 5/5 in all groups bilateral.   Radiographic Exam:  Normal osseous mineralization. Joint spaces preserved. No fracture/dislocation/boney destruction.    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 #2 injection of 0.5 mL Celestone Soluspan injected in the patient's left ankle. #3 excisional debridement of the soft corn tissue was performed using a tissue nipper without incident or bleeding. #4 recommend that the patient purchase an over-the-counter ankle brace to support the ankle structures of the left lower extremity #5 return to clinic when  necessary  Edrick Kins, DPM [facility]  Dr. Edrick Kins, [address]                                        [place], [name] [address]                Office [phone]  Fax [phone]

## 2017-07-01 ENCOUNTER — Ambulatory Visit (INDEPENDENT_AMBULATORY_CARE_PROVIDER_SITE_OTHER): Payer: Medicare Other | Admitting: Sports Medicine

## 2017-07-01 ENCOUNTER — Encounter: Payer: Self-pay | Admitting: Sports Medicine

## 2017-07-01 DIAGNOSIS — M4802 Spinal stenosis, cervical region: Secondary | ICD-10-CM

## 2017-07-01 MED ORDER — TRAMADOL HCL 50 MG PO TABS: 50.0000 mg | ORAL_TABLET | Freq: Two times a day (BID) | ORAL | 1 refills | Status: DC | PRN

## 2017-07-01 MED ORDER — PREDNISONE 10 MG PO TABS: ORAL_TABLET | ORAL | 0 refills | Status: DC

## 2017-07-01 NOTE — Progress Notes (Signed)
   Subjective:    Patient ID: Kendra [name], female    DOB: 1966-05-05, 51 y.o.   MRN: [id number]  HPI chief complaint: Neck pain  Kendra [name] comes in today complaining of neck pain for the past month. She has a history of cervical spinal stenosis. MRI in 2013 confirmed this. She was treated with steroids and had noticeable improvement up until a month ago. Pain has returned and is identical to what she experienced previously. Pain is diffuse throughout her neck. Worse with activity but also present at night when sleeping. No radiating pain into her arms. No numbness or tingling.    Review of Systems As above    Objective:   Physical Exam  Well-developed, well-nourished. No acute distress. Vital signs reviewed  Cervical spine: Fairly good cervical range of motion in all planes although it is painful. She is diffusely tender to palpation around the cervical spine and paraspinal musculature but nothing focal. No spasm. Neurological exam shows no gross focal neurological deficits of either upper extremity.      Assessment & Plan:   Neck pain secondary to cervical degenerative disc disease and spinal stenosis  Since the patient improved with a 12 day Sterapred Dosepak previously, I will repeat that treatment today. I will also refill her tramadol to take as needed for more severe pain. If symptoms persist, I would start with getting updated imaging starting with an x-ray of her cervical spine. She will follow-up for ongoing or recalcitrant issues.

## 2017-08-10 ENCOUNTER — Telehealth: Payer: Self-pay | Admitting: Family Medicine

## 2017-08-10 NOTE — Telephone Encounter (Signed)
**  After Hours/ Emergency Line Call*  Received a call to report that Kendra [name] endorsing sharp pain in the middle of her chest for the last 4 months. No alleviating factors. Hurts worse with laying down and sitting up. Has not tried any medications. Admits to shortness of breath. Denies any nausea, vomiting, diarrhea.   Advised patient to go to the ED for further assessment. She states she will need to call 911 because she doesn't have a ride. She asked for an appointment for Monday. I scheduled her for 2:10pm with myself but I advised her to be assessed tonight by the ED.   Carlyle Dolly, MD PGY-3, [facility]

## 2017-08-12 ENCOUNTER — Encounter: Payer: Self-pay | Admitting: Family Medicine

## 2017-08-12 ENCOUNTER — Ambulatory Visit (HOSPITAL_COMMUNITY)
Admission: RE | Admit: 2017-08-12 | Discharge: 2017-08-12 | Disposition: A | Discharge status: Home / Self Care | Payer: Medicare Other | Source: Ambulatory Visit | Attending: Family Medicine | Admitting: Family Medicine

## 2017-08-12 ENCOUNTER — Ambulatory Visit (INDEPENDENT_AMBULATORY_CARE_PROVIDER_SITE_OTHER): Payer: Medicare Other | Admitting: Family Medicine

## 2017-08-12 ENCOUNTER — Other Ambulatory Visit: Payer: Self-pay

## 2017-08-12 VITALS — BP 124/68 | HR 93 | Temp 97.6°F | Ht 64.0 in | Wt 286.0 lb

## 2017-08-12 DIAGNOSIS — R0789 Other chest pain: Secondary | ICD-10-CM

## 2017-08-12 DIAGNOSIS — M1990 Unspecified osteoarthritis, unspecified site: Secondary | ICD-10-CM | POA: Insufficient documentation

## 2017-08-12 DIAGNOSIS — R079 Chest pain, unspecified: Secondary | ICD-10-CM | POA: Insufficient documentation

## 2017-08-12 DIAGNOSIS — K219 Gastro-esophageal reflux disease without esophagitis: Secondary | ICD-10-CM | POA: Diagnosis not present

## 2017-08-12 DIAGNOSIS — F17210 Nicotine dependence, cigarettes, uncomplicated: Secondary | ICD-10-CM | POA: Insufficient documentation

## 2017-08-12 DIAGNOSIS — I10 Essential (primary) hypertension: Secondary | ICD-10-CM | POA: Insufficient documentation

## 2017-08-12 DIAGNOSIS — Z23 Encounter for immunization: Secondary | ICD-10-CM

## 2017-08-12 MED ORDER — PANTOPRAZOLE SODIUM 40 MG PO TBEC: 40.0000 mg | DELAYED_RELEASE_TABLET | Freq: Every day | ORAL | 1 refills | Status: DC

## 2017-08-12 MED ORDER — ACETAMINOPHEN ER 650 MG PO TBCR: 650.0000 mg | EXTENDED_RELEASE_TABLET | Freq: Three times a day (TID) | ORAL | 1 refills | Status: DC | PRN

## 2017-08-12 NOTE — Assessment & Plan Note (Signed)
Patient does have history of chest pain in the past but was actually found to have more epigastric pain which was thought to be related to GERD (also has chronic alcohol abuse and tobacco abuse and was referred to GI but never went).  She has not been taking her PPI.  Her pain does seem more musculoskeletal in etiology on exam today and not related to GERD.  EKG reassuring with normal sinus rhythm and no ST changes.  Less likely cardiac etiology.  This could still be a manifestation of a gastric ulcer or uncontrolled GERD given her medical history. -Tylenol as needed for pain - Resume PPI - Follow-up in 2 weeks

## 2017-08-12 NOTE — Patient Instructions (Signed)
Thank you for coming in today, it was so nice to see you! Today we talked about:    I think your chest pain is from costochondritis, this is inflammation in the bone on your sternum. Please take Tylenol every 8 hours as needed for pain.   I sent in a medication to prevent heart burn as this may be contributing to your symptoms  Please follow up in  2 weeks. You can schedule this appointment at the front desk before you leave or call the clinic.  Bring in all your medications or supplements to each appointment for review.   If we ordered any tests today, you will be notified via telephone of any abnormalities. If everything is normal you will get a letter in the mail.   If you have any questions or concerns, please do not hesitate to call the office at [phone]. You can also message me directly via MyChart.   Sincerely,  Smitty Cords, MD   Costochondritis Costochondritis is swelling and irritation (inflammation) of the tissue (cartilage) that connects your ribs to your breastbone (sternum). This causes pain in the front of your chest. Usually, the pain:  Starts gradually.  Is in more than one rib.  This condition usually goes away on its own over time. Follow these instructions at home:  Do not do anything that makes your pain worse.  If directed, put ice on the painful area: ? Put ice in a plastic bag. ? Place a towel between your skin and the bag. ? Leave the ice on for 20 minutes, 2-3 times a day.  If directed, put heat on the affected area as often as told by your doctor. Use the heat source that your doctor tells you to use, such as a moist heat pack or a heating pad. ? Place a towel between your skin and the heat source. ? Leave the heat on for 20-30 minutes. ? Take off the heat if your skin turns bright red. This is very important if you cannot feel pain, heat, or cold. You may have a greater risk of getting burned.  Take over-the-counter and prescription  medicines only as told by your doctor.  Return to your normal activities as told by your doctor. Ask your doctor what activities are safe for you.  Keep all follow-up visits as told by your doctor. This is important. Contact a doctor if:  You have chills or a fever.  Your pain does not go away or it gets worse.  You have a cough that does not go away. Get help right away if:  You are short of breath. This information is not intended to replace advice given to you by your health care provider. Make sure you discuss any questions you have with your health care provider. Document Released: 01/30/2008 Document Revised: 03/02/2016 Document Reviewed: 12/07/2015 Elsevier Interactive Patient Education  [employer].

## 2017-08-12 NOTE — Progress Notes (Signed)
Subjective:    Patient ID: Kendra [name] , female   DOB: Jan 11, 1966 , 51 y.o..   MRN: [id number]  HPI  Kendra [name] is a 51 year old female with past medical history of  GERD, hypertension, uterine fibroid, osteoarthritis, morbid obesity, alcohol abuse, tobacco abuse here for  Chief Complaint  Patient presents with  . Chest Pain    1. Chest Pain:  Patient complains of chest pain.  Onset was 4 months ago, with unchanged course since that time. It is constant all day long  The patient describes the pain as constant, achy in nature, does not radiate.  Patient rates pain as a 8/10 in intensity.   Associated symptoms are dyspnea and palpitations.  Aggravating factors are palpation of chest and smoking.   Alleviating factors are: none.  Patient's cardiac risk factors are hypertension, obesity (BMI >= 30 kg/m2), sedentary lifestyle and smoking/ tobacco exposure.   Patient's risk factors for DVT/PE: none.  Previous cardiac testing: none. Alcohol: 2 40 ounces a day Tobacco: Smokes about 2 PPD  Denies any nausea,vomiting, diarrhea, constipation.  Does not take daily ASA.  In fact she has been noncompliant with all of her medications with the exception of tramadol  Review of Systems: Per HPI.   Past Medical History: Patient Active Problem List   Diagnosis Date Noted  . High risk medication use 02/18/2017  . Neck pain on left side 02/18/2017  . Abdominal pain 12/24/2016  . Swelling of upper lip 04/03/2016  . HTN (hypertension) 04/03/2016  . Leg swelling 02/15/2016  . Hemangioma of liver 07/07/2015  . Chest pain 05/06/2015  . Uterine fibroid 01/14/2015  . Dysfunctional uterine bleeding 01/14/2015  . Tendinopathy of right rotator cuff 08/24/2014  . Cervical neck pain with evidence of disc disease 05/14/2012  . Osteoarthrosis, unspecified whether generalized or localized, involving lower leg 05/14/2012  . Chronic low back pain 12/20/2010  . Morbid obesity ([place]) 04/07/2008  .  ALCOHOL ABUSE 04/07/2008  . TOBACCO ABUSE 04/07/2008  . Iron deficiency anemia 10/24/2006  . GASTROESOPHAGEAL REFLUX, NO ESOPHAGITIS 10/24/2006    Medications: reviewed and updated   Social Hx:  reports that she has been smoking cigarettes.  She started smoking about 14 years ago. She has been smoking about 1.50 packs per day. she has never used smokeless tobacco.   Objective:   BP 124/68   Pulse 93   Temp 97.6 F (36.4 C) (Oral)   Ht 5\' 4"  (1.626 m)   Wt 286 lb (129.7 kg)   LMP 02/21/2015 (Approximate)   SpO2 98%   BMI 49.09 kg/m  Physical Exam  Gen: NAD, alert, cooperative with exam, well-appearing Cardiac: Regular rate and rhythm, normal S1/S2, no murmur, no edema, capillary refill brisk  Chest; tender to palpation on sternum and surrounding costochondral areas Respiratory: Clear to auscultation bilaterally, no wheezes, non-labored breathing Gastrointestinal: soft, non tender, non distended, bowel sounds present   Assessment & Plan:  Chest pain Patient does have history of chest pain in the past but was actually found to have more epigastric pain which was thought to be related to GERD (also has chronic alcohol abuse and tobacco abuse and was referred to GI but never went).  She has not been taking her PPI.  Her pain does seem more musculoskeletal in etiology on exam today and not related to GERD.  EKG reassuring with normal sinus rhythm and no ST changes.  Less likely cardiac etiology.  This could still be a manifestation of  a gastric ulcer or uncontrolled GERD given her medical history. -Tylenol as needed for pain - Resume PPI - Follow-up in 2 weeks  Orders Placed This Encounter  Procedures  . Flu Vaccine QUAD 36+ mos IM  . EKG 12-Lead   Meds ordered this encounter  Medications  . pantoprazole (PROTONIX) 40 MG tablet    Sig: Take 1 tablet (40 mg total) by mouth daily.    Dispense:  30 tablet    Refill:  1  . acetaminophen (TYLENOL 8 HOUR) 650 MG CR tablet     Sig: Take 1 tablet (650 mg total) by mouth every 8 (eight) hours as needed for pain.    Dispense:  30 tablet    Refill:  1    Smitty Cords, MD [place], PGY-3

## 2017-09-17 ENCOUNTER — Telehealth: Payer: Self-pay | Admitting: *Deleted

## 2017-09-17 NOTE — Telephone Encounter (Signed)
Rx form placed in Dr. Andria Frames box. Katharina Caper, April D, [place]

## 2017-09-17 NOTE — Telephone Encounter (Signed)
Unsolicited pseudo prescriptions from a for-profit pharmacy.  Denied.

## 2017-10-14 ENCOUNTER — Other Ambulatory Visit: Payer: Self-pay

## 2017-10-14 ENCOUNTER — Encounter: Payer: Self-pay | Admitting: Sports Medicine

## 2017-10-14 ENCOUNTER — Ambulatory Visit (INDEPENDENT_AMBULATORY_CARE_PROVIDER_SITE_OTHER): Payer: Medicare Other | Admitting: Sports Medicine

## 2017-10-14 ENCOUNTER — Ambulatory Visit
Admission: RE | Admit: 2017-10-14 | Discharge: 2017-10-14 | Disposition: A | Discharge status: Home / Self Care | Payer: Medicare Other | Source: Ambulatory Visit | Attending: Sports Medicine | Admitting: Sports Medicine

## 2017-10-14 VITALS — BP 123/73 | Ht 64.0 in | Wt 290.0 lb

## 2017-10-14 DIAGNOSIS — M25512 Pain in left shoulder: Secondary | ICD-10-CM | POA: Diagnosis not present

## 2017-10-14 DIAGNOSIS — S6991XA Unspecified injury of right wrist, hand and finger(s), initial encounter: Secondary | ICD-10-CM

## 2017-10-14 DIAGNOSIS — M79644 Pain in right finger(s): Secondary | ICD-10-CM | POA: Diagnosis not present

## 2017-10-14 DIAGNOSIS — S4992XA Unspecified injury of left shoulder and upper arm, initial encounter: Secondary | ICD-10-CM | POA: Diagnosis not present

## 2017-10-14 MED ORDER — TRAMADOL HCL 50 MG PO TABS: 50.0000 mg | ORAL_TABLET | Freq: Two times a day (BID) | ORAL | 1 refills | Status: DC | PRN

## 2017-10-14 MED ORDER — ALBUTEROL SULFATE HFA 108 (90 BASE) MCG/ACT IN AERS: INHALATION_SPRAY | RESPIRATORY_TRACT | 3 refills | Status: DC

## 2017-10-14 MED ORDER — PREDNISONE 10 MG PO TABS: ORAL_TABLET | ORAL | 0 refills | Status: DC

## 2017-10-14 NOTE — Progress Notes (Addendum)
   Subjective:    Patient ID: Kendra [name], female    DOB: Jul 30, 1966, 52 y.o.   MRN: [id number]  HPI chief complaint: Left shoulder and arm pain, right index finger pain  Patient comes in today with a couple of different complaints. Main complaint is left shoulder pain that started after a fall on January 31st. Since then, she has had diffuse pain around the shoulder. Pain will at times radiates to the elbow. She has difficulty reaching directly overhead. She also has noticed some weakness. Pain at night as well. Some numbness around the shoulder but she denies numbness or tingling down into her hand. She is also complaining of chronic right finger pain which she localizes to the DIP. She had a tendon laceration in 2003 requiring surgical repair and since that time has had intermittent pain. She's not noticed any swelling.    Review of Systems As above    Objective:   Physical Exam  Well-developed, well-nourished. No acute distress. Awake alert and oriented 3. Vital signs reviewed  Left shoulder: Forward flexion is 0-140. Abduction is 0-150. Internal and external rotation are good. No tenderness to palpation over the acromioclavicular joint nor over the bicipital groove. Rotator cuff strength is 5/5. Negative O'Briens. Neurovascularly intact distally.  Right index finger: There appears to be a small mucinous cyst off of the dorsum of the DIP joint. Flexor and extensor tendons intact. No swelling. Slight tenderness to palpation. Brisk capillary refill.  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 Refill tramadol. If  symptoms persist in regards to the left shoulder, consider further diagnostic imaging. In regards to her right index finger I recommended over-the-counter topical Aspercreme as needed. Follow-up when necessary.

## 2017-10-16 ENCOUNTER — Telehealth: Payer: Self-pay

## 2017-10-16 ENCOUNTER — Telehealth: Payer: Self-pay | Admitting: Sports Medicine

## 2017-10-16 NOTE — Telephone Encounter (Signed)
Opened by mistake.

## 2017-11-06 ENCOUNTER — Telehealth: Payer: Self-pay | Admitting: *Deleted

## 2017-11-06 NOTE — Telephone Encounter (Signed)
[facility] calling to request a refill for lidocaine ointment and hydrocortisone 2% cream.  Will route request to PCP.  Burna Forts, BSN, RN-BC

## 2017-11-11 NOTE — Telephone Encounter (Signed)
I have not prescribed these medications for this patient. Will not prescribe at this time.   Smitty Cords, MD [place], PGY-3

## 2017-11-26 ENCOUNTER — Other Ambulatory Visit: Payer: Self-pay

## 2017-12-13 ENCOUNTER — Ambulatory Visit (HOSPITAL_COMMUNITY)
Admission: EM | Admit: 2017-12-13 | Discharge: 2017-12-13 | Disposition: A | Discharge status: Home / Self Care | Payer: Medicare Other | Attending: Internal Medicine | Admitting: Internal Medicine

## 2017-12-13 ENCOUNTER — Encounter (HOSPITAL_COMMUNITY): Payer: Self-pay

## 2017-12-13 ENCOUNTER — Other Ambulatory Visit: Payer: Self-pay

## 2017-12-13 DIAGNOSIS — R079 Chest pain, unspecified: Secondary | ICD-10-CM

## 2017-12-13 MED ORDER — ASPIRIN 81 MG PO CHEW
324.0000 mg | CHEWABLE_TABLET | Freq: Once | ORAL | Status: AC
  Administered 2017-12-13: 324 mg via ORAL

## 2017-12-13 MED ORDER — ASPIRIN 81 MG PO CHEW
CHEWABLE_TABLET | ORAL | Status: AC
  Filled 2017-12-13: qty 4

## 2017-12-13 NOTE — ED Notes (Signed)
Dr. Valere Dross talking to the patient now, EKG completed. Pt sent to the Emergency Department for further workup.

## 2017-12-13 NOTE — ED Triage Notes (Signed)
Pt presents with complaints of chest pain x 2-3 weeks. Reports she is supposed to have an ultrasound of her heart but never went. Also endorses pain in her calf.

## 2017-12-14 ENCOUNTER — Encounter (HOSPITAL_COMMUNITY): Payer: Self-pay

## 2017-12-14 ENCOUNTER — Emergency Department (HOSPITAL_COMMUNITY): Payer: Medicare Other

## 2017-12-14 ENCOUNTER — Emergency Department (HOSPITAL_BASED_OUTPATIENT_CLINIC_OR_DEPARTMENT_OTHER)
Admit: 2017-12-14 | Discharge: 2017-12-14 | Disposition: A | Discharge status: Home / Self Care | Payer: Medicare Other | Attending: Emergency Medicine | Admitting: Emergency Medicine

## 2017-12-14 ENCOUNTER — Other Ambulatory Visit: Payer: Self-pay

## 2017-12-14 ENCOUNTER — Emergency Department (HOSPITAL_COMMUNITY)
Admission: EM | Admit: 2017-12-14 | Discharge: 2017-12-14 | Disposition: A | Discharge status: Home / Self Care | Payer: Medicare Other | Attending: Emergency Medicine | Admitting: Emergency Medicine

## 2017-12-14 DIAGNOSIS — M7989 Other specified soft tissue disorders: Secondary | ICD-10-CM

## 2017-12-14 DIAGNOSIS — J45909 Unspecified asthma, uncomplicated: Secondary | ICD-10-CM | POA: Diagnosis not present

## 2017-12-14 DIAGNOSIS — R0789 Other chest pain: Secondary | ICD-10-CM | POA: Diagnosis not present

## 2017-12-14 DIAGNOSIS — Z79899 Other long term (current) drug therapy: Secondary | ICD-10-CM | POA: Diagnosis not present

## 2017-12-14 DIAGNOSIS — F17210 Nicotine dependence, cigarettes, uncomplicated: Secondary | ICD-10-CM | POA: Diagnosis not present

## 2017-12-14 DIAGNOSIS — M79605 Pain in left leg: Secondary | ICD-10-CM | POA: Diagnosis not present

## 2017-12-14 DIAGNOSIS — Z7982 Long term (current) use of aspirin: Secondary | ICD-10-CM | POA: Insufficient documentation

## 2017-12-14 DIAGNOSIS — R252 Cramp and spasm: Secondary | ICD-10-CM | POA: Diagnosis not present

## 2017-12-14 DIAGNOSIS — R0602 Shortness of breath: Secondary | ICD-10-CM | POA: Diagnosis not present

## 2017-12-14 DIAGNOSIS — I10 Essential (primary) hypertension: Secondary | ICD-10-CM | POA: Diagnosis not present

## 2017-12-14 DIAGNOSIS — R079 Chest pain, unspecified: Secondary | ICD-10-CM | POA: Diagnosis not present

## 2017-12-14 LAB — BASIC METABOLIC PANEL
Anion gap: 9 (ref 5–15)
BUN: 9 mg/dL (ref 6–20)
CO2: 21 mmol/L — ABNORMAL LOW (ref 22–32)
CREATININE: 0.87 mg/dL (ref 0.44–1.00)
Calcium: 10.2 mg/dL (ref 8.9–10.3)
Chloride: 108 mmol/L (ref 101–111)
GFR calc non Af Amer: >60 mL/min (ref >60)
Glucose, Bld: 105 mg/dL — ABNORMAL HIGH (ref 65–99)
Potassium: 4 mmol/L (ref 3.5–5.1)
SODIUM: 138 mmol/L (ref 135–145)

## 2017-12-14 LAB — CBC
HCT: 45.5 % (ref 36.0–46.0)
Hemoglobin: 15.3 g/dL — ABNORMAL HIGH (ref 12.0–15.0)
MCH: 34.9 pg — AB (ref 26.0–34.0)
MCHC: 33.6 g/dL (ref 30.0–36.0)
MCV: 103.9 fL — ABNORMAL HIGH (ref 78.0–100.0)
PLATELETS: 251 10*3/uL (ref 150–400)
RBC: 4.38 MIL/uL (ref 3.87–5.11)
RDW: 12.8 % (ref 11.5–15.5)
WBC: 8.2 10*3/uL (ref 4.0–10.5)

## 2017-12-14 LAB — I-STAT TROPONIN, ED
TROPONIN I, POC: 0 ng/mL (ref 0.00–0.08)
Troponin i, poc: 0 ng/mL (ref 0.00–0.08)

## 2017-12-14 LAB — D-DIMER, QUANTITATIVE (NOT AT ARMC): D DIMER QUANT: 0.7 ug{FEU}/mL — AB (ref 0.00–0.50)

## 2017-12-14 MED ORDER — IOPAMIDOL (ISOVUE-370) INJECTION 76%
INTRAVENOUS | Status: AC
  Administered 2017-12-14: 100 mL
  Filled 2017-12-14: qty 100

## 2017-12-14 NOTE — ED Provider Notes (Signed)
[place] EMERGENCY DEPARTMENT Provider Note   CSN: [id number] Arrival date & time: 12/14/17  0901     History   Chief Complaint Chief Complaint  Patient presents with  . Chest Pain    HPI Kendra [name] is a 52 y.o. female.  HPI   Presents with concern for chest pain and calf pain.  Left calf is tightening up, every now and then has right sided but left one has been more swollen and painful. 2wk of leg pain. Also notes hip pain radiating to the calf. No trauma. Occasional back pain but no weakness in legs and no loss control bowel or bladder. Chest pain started for 2-3 weeks, feels like sharp pain, constant.  Not exertional, there with rest and moving around. Has been constant, worse when sitting up and laying down, worse with movements and palpation. Feels like food just sitting in middle of chest when eating.  Reports shortness of breath all of the time, albuterol inhaler helps some.  No nausea or vomitnig. Occ cough. No fevers.  Went to urgent care and sent ot ED yesterday but was so busy so came back today  Goes to [facility] practices, has not had stress test or seen cardiology  No hx of smoking, drugs, no fam hx of early CAD or known CAD No recent surgeries, no estrogen, no hx of cancer, no immobilization, no hx of DVT or PE    Past Medical History:  Diagnosis Date  . Anemia   . Asthma   . Esophagitis   . GERD (gastroesophageal reflux disease)   . HTN (hypertension)     Patient Active Problem List   Diagnosis Date Noted  . High risk medication use 02/18/2017  . Neck pain on left side 02/18/2017  . Abdominal pain 12/24/2016  . Swelling of upper lip 04/03/2016  . HTN (hypertension) 04/03/2016  . Leg swelling 02/15/2016  . Hemangioma of liver 07/07/2015  . Chest pain 05/06/2015  . Uterine fibroid 01/14/2015  . Dysfunctional uterine bleeding 01/14/2015  . Tendinopathy of right rotator cuff 08/24/2014  . Cervical neck pain with  evidence of disc disease 05/14/2012  . Osteoarthrosis, unspecified whether generalized or localized, involving lower leg 05/14/2012  . Chronic low back pain 12/20/2010  . Morbid obesity ([place]) 04/07/2008  . ALCOHOL ABUSE 04/07/2008  . TOBACCO ABUSE 04/07/2008  . Iron deficiency anemia 10/24/2006  . GASTROESOPHAGEAL REFLUX, NO ESOPHAGITIS 10/24/2006    Past Surgical History:  Procedure Laterality Date  . ABDOMINAL HYSTERECTOMY  04/12/15  . ANKLE SURGERY Left   . CESAREAN SECTION    . CYSTOSCOPY N/A 04/12/2015   Procedure: CYSTOSCOPY;  Surgeon: Lavonia Drafts, MD;  Location: [facility];  Service: Gynecology;  Laterality: N/A;  . FINGER SURGERY    . TIBIA FRACTURE SURGERY    . TUBAL LIGATION       OB History    Gravida  3   Para  3   Term  3   Preterm  0   AB  0   Living  3     SAB  0   TAB  0   Ectopic  0   Multiple  0   Live Births               Home Medications    Prior to Admission medications   Medication Sig Start Date End Date Taking? Authorizing Provider  albuterol (PROAIR HFA) 108 (90 Base) MCG/ACT inhaler inhale 2 puffs every  4 hours if needed wheezing or shortness of breath 10/14/17  Yes Gambino, Arlie Solomons, MD  alum & mag hydroxide-simeth (MAALOX/MYLANTA) 200-200-20 MG/5ML suspension Take 30 mLs by mouth every 6 (six) hours as needed for indigestion or heartburn. Reported on 10/26/2015   Yes [provider]  aspirin EC 81 MG tablet Take 1 tablet (81 mg total) by mouth daily. 02/18/17  Yes McKeag, Marylynn Pearson, MD  atorvastatin (LIPITOR) 40 MG tablet Take 1 tablet (40 mg total) by mouth daily. 02/18/17  Yes McKeag, Marylynn Pearson, MD  docusate sodium (COLACE) 100 MG capsule Take 1 capsule (100 mg total) by mouth 2 (two) times daily. Patient taking differently: Take 100 mg by mouth 2 (two) times daily as needed for mild constipation.  04/13/15  Yes Lavonia Drafts, MD  furosemide (LASIX) 20 MG tablet take 1 tablet by mouth once daily 05/22/16  Yes  Haney, Alyssa A, MD  gabapentin (NEURONTIN) 100 MG capsule Take 1 capsule (100 mg total) by mouth 3 (three) times daily. Patient taking differently: Take 100 mg by mouth 2 (two) times daily as needed (pain).  02/18/17  Yes McKeag, Marylynn Pearson, MD  pantoprazole (PROTONIX) 40 MG tablet Take 1 tablet (40 mg total) by mouth daily. 08/12/17  Yes Carlyle Dolly, MD  predniSONE (DELTASONE) 10 MG tablet Use as directed per doctors orders. Patient taking differently: Take 10 mg by mouth 2 (two) times daily with a meal. Use as directed per doctors orders. 10/14/17  Yes Draper, Carlos Levering, DO  traMADol (ULTRAM) 50 MG tablet Take 1 tablet (50 mg total) by mouth every 12 (twelve) hours as needed. Patient taking differently: Take 50 mg by mouth every 12 (twelve) hours as needed (hip pain).  10/14/17  Yes Draper, Carlos Levering, DO  acetaminophen (TYLENOL 8 HOUR) 650 MG CR tablet Take 1 tablet (650 mg total) by mouth every 8 (eight) hours as needed for pain. 08/12/17   Carlyle Dolly, MD  lisinopril (PRINIVIL,ZESTRIL) 10 MG tablet Take 1 tablet (10 mg total) by mouth daily. Patient not taking: Reported on 12/14/2017 02/18/17   McKeag, Marylynn Pearson, MD    Family History Family History  Problem Relation Age of Onset  . Diabetes Mother   . Heart disease Mother   . Lung cancer Father 37    Social History Social History   Tobacco Use  . Smoking status: Current Every Day Smoker    Packs/day: 1.50    Types: Cigarettes    Start date: 08/27/2002  . Smokeless tobacco: Never Used  . Tobacco comment: cut back to 4-5 cigs per day from 1ppd  Substance Use Topics  . Alcohol use: Yes    Alcohol/week: 0.0 oz    Comment: 2 -40oz daily  . Drug use: No     Allergies   Lisinopril   Review of Systems Review of Systems  Constitutional: Negative for fever.  HENT: Negative for sore throat.   Eyes: Negative for visual disturbance.  Respiratory: Positive for shortness of breath. Negative for cough.   Cardiovascular:  Positive for chest pain.  Gastrointestinal: Negative for abdominal pain, nausea and vomiting.  Genitourinary: Negative for difficulty urinating.  Musculoskeletal: Positive for arthralgias. Negative for back pain (sometimes, not current concern) and neck pain.  Skin: Negative for rash.  Neurological: Negative for syncope and headaches.     Physical Exam Updated Vital Signs BP 112/65 (BP Location: Right Arm)   Pulse 83   Temp 98.7 F (37.1 C) (Oral)   Resp Marland Kitchen)  24   Ht 5\' 9"  (1.753 m)   Wt 131.5 kg (290 lb)   LMP 02/21/2015 (Approximate)   SpO2 98%   BMI 42.83 kg/m   Physical Exam  Constitutional: She is oriented to person, place, and time. She appears well-developed and well-nourished. No distress.  HENT:  Head: Normocephalic and atraumatic.  Eyes: Conjunctivae and EOM are normal.  Neck: Normal range of motion.  Cardiovascular: Normal rate, regular rhythm, normal heart sounds and intact distal pulses. Exam reveals no gallop and no friction rub.  No murmur heard. Chest wall tenderness  Pulmonary/Chest: Effort normal and breath sounds normal. No respiratory distress. She has no wheezes. She has no rales.  Abdominal: Soft. She exhibits no distension. There is no tenderness. There is no guarding.  Musculoskeletal: She exhibits no edema or tenderness.  Neurological: She is alert and oriented to person, place, and time.  Skin: Skin is warm and dry. No rash noted. She is not diaphoretic. No erythema.  Nursing note and vitals reviewed.    ED Treatments / Results  Labs (all labs ordered are listed, but only abnormal results are displayed) Labs Reviewed  BASIC METABOLIC PANEL - Abnormal; Notable for the following components:      Result Value   CO2 21 (*)    Glucose, Bld 105 (*)    All other components within normal limits  CBC - Abnormal; Notable for the following components:   Hemoglobin 15.3 (*)    MCV 103.9 (*)    MCH 34.9 (*)    All other components within normal limits   D-DIMER, QUANTITATIVE (NOT AT [facility]) - Abnormal; Notable for the following components:   D-Dimer, Quant 0.70 (*)    All other components within normal limits  I-STAT TROPONIN, ED  I-STAT TROPONIN, ED    EKG EKG Interpretation  Date/Time:  Saturday December 14 2017 09:12:08 EDT Ventricular Rate:  88 PR Interval:  164 QRS Duration: 68 QT Interval:  360 QTC Calculation: 435 R Axis:   41 Text Interpretation:  Normal sinus rhythm Cannot rule out Anterior infarct , age undetermined Abnormal ECG No significant change since last tracing Confirmed by Gareth Morgan [phone]) on 12/14/2017 12:13:30 PM Also confirmed by Gareth Morgan [phone])  on 12/14/2017 3:38:13 PM   Radiology Dg Chest 2 View  Result Date: 12/14/2017 CLINICAL DATA:  Chest pain and shortness of breath for the past 2-3 weeks. Smoker. EXAM: CHEST - 2 VIEW COMPARISON:  02/15/2016. FINDINGS: Normal sized heart. Clear lungs. Mild diffuse peribronchial thickening. Unremarkable bones. IMPRESSION: Mild chronic bronchitic changes.  No acute abnormality. Electronically Signed   By: Claudie Revering M.D.   On: 12/14/2017 10:08   Ct Angio Chest Pe W And/or Wo Contrast  Result Date: 12/14/2017 CLINICAL DATA:  Chest pain, left arm and leg pain x3 weeks EXAM: CT ANGIOGRAPHY CHEST WITH CONTRAST TECHNIQUE: Multidetector CT imaging of the chest was performed using the standard protocol during bolus administration of intravenous contrast. Multiplanar CT image reconstructions and MIPs were obtained to evaluate the vascular anatomy. CONTRAST:  166mL ISOVUE-370 IOPAMIDOL (ISOVUE-370) INJECTION 76% COMPARISON:  05/06/2015 FINDINGS: Cardiovascular: Right ventricle is nondilated. Satisfactory opacification of pulmonary arteries noted, and there is no evidence of pulmonary emboli. Mild scattered coronary calcifications. Adequate contrast opacification of the thoracic aorta with no evidence of dissection, aneurysm, or stenosis. There is bovine variant  brachiocephalic arch anatomy without proximal stenosis. No significant atheromatous plaque. Mediastinum/Nodes: No mediastinal or hilar adenopathy. No pericardial effusion. Lungs/Pleura: No pleural effusion. No pneumothorax.  Lungs clear. Upper Abdomen: No acute findings. Musculoskeletal: No chest wall abnormality. No acute osseous findings. Obesity. Degenerative change in bilateral shoulders. Review of the MIP images confirms the above findings. IMPRESSION: 1. Negative for acute PE or thoracic aortic dissection. 2. Atherosclerosis, including coronary artery disease. Please note that although the presence of coronary artery calcium documents the presence of coronary artery disease, the severity of this disease and any potential stenosis cannot be assessed on this non-gated CT examination. Assessment for potential risk factor modification, dietary therapy or pharmacologic therapy may be warranted, if clinically indicated. Electronically Signed   By: Lucrezia Europe M.D.   On: 12/14/2017 14:38    Procedures Procedures (including critical care time)  Medications Ordered in ED Medications  iopamidol (ISOVUE-370) 76 % injection (100 mLs  Contrast Given 12/14/17 1406)     Initial Impression / Assessment and Plan / ED Course  I have reviewed the triage vital signs and the nursing notes.  Pertinent labs & imaging results that were available during my care of the patient were reviewed by me and considered in my medical decision making (see chart for details).     52yo female with history of hypertension and obesity presents with concern for 2-3 weeks of constant sharp chest pain and left leg tightness.  Regarding leg, pulses equal bilaterally, good perfusion, no sign of acute arterial occlusion.  DVT study was done which showed no evidence of DVT.  No significant electrolyte abnormalities.  No signs of infection on exam.  Suspect other etiology of muscle cramps.  Regarding hip pain, this is not associated with  trauma, no fever, low suspicion for fracture septic arthritis, and may be radicular or related to osteoarthritis, and recommend continue primary care physician follow-up.  Regarding patient's chest pain, EKG shows no signs of pericarditis or acute ischemia.  Chest x-ray completed shows no sign of pneumonia or pneumothorax.  D-dimer was positive, and CT PE study was done showing no evidence of pulmonary embolus.  CT did show evidence of coronary artery disease.  Patient had 2 troponins which were both 0.00.  Discussed with patient given her chest pain and evidence of coronary artery disease on CT that observation overnight is reasonable, however also discussed that given her atypical symptoms have a low suspicion that her chest pain is anginal in nature.  She describes sharp, constant chest pain for 2-3 weeks that is not exertional, however does worsen with palpation and small movements, and may be muscular in nature, as well as description of some symptoms with swallowing which may represent esophageal pathology.  Discussed with cardiology on-call who agreed with shared decision making the patient.  Patient desires to go home with understanding of reasons to return.  Etiology is organizing outpatient follow-up.  Discussed that while she does not have ACS today, and feel her CP is atypical and likely other pathology, that she does have evidence of heart disease on CT and recommend aspirin, close follow up, dietary and lifestyle changes. Patient discharged in stable condition with understanding of reasons to return.   Final Clinical Impressions(s) / ED Diagnoses   Final diagnoses:  Atypical chest pain  Leg cramps    ED Discharge Orders    None       Gareth Morgan, MD 12/14/17 [phone]

## 2017-12-14 NOTE — ED Notes (Signed)
Pt discharged from ED; instructions provided; Pt encouraged to return to ED if symptoms worsen and to f/u with PCP; Pt verbalized understanding of all instructions 

## 2017-12-14 NOTE — ED Triage Notes (Signed)
PT . Placed staff call to report the need to go because there were things she needed to do.

## 2017-12-14 NOTE — ED Triage Notes (Signed)
Pt c/o CP X2-3 weeks with pain in left side arm and leg.

## 2017-12-14 NOTE — ED Notes (Signed)
Got patient on the monitor family is at bedside and call bell in reach

## 2017-12-14 NOTE — Progress Notes (Signed)
LLE venous duplex prelim: negative for DVT. Belvia Gotschall Eunice, RDMS, RVT  

## 2017-12-14 NOTE — Discharge Instructions (Signed)
Your pain may be secondary to muscular pain or other given atypical features, but I recommend follow up with your primary care physician as well as Cardiology given your heart risk factors.  If your pain worsens or you develop other concerns, please return to the Emergency Department for further care.

## 2017-12-14 NOTE — ED Notes (Signed)
Pt back from CT

## 2017-12-17 ENCOUNTER — Ambulatory Visit: Payer: Medicare Other | Admitting: Internal Medicine

## 2017-12-18 NOTE — Telephone Encounter (Signed)
Error

## 2017-12-19 ENCOUNTER — Telehealth: Payer: Self-pay

## 2017-12-19 NOTE — Telephone Encounter (Signed)
Returned patient's call. No answer or option to leave voicemail.   Unsure if the medications she is on are related. If she calls back please have her schedule an appointment so we can further assess her leg cramping. Thank you   Smitty Cords, MD [place], PGY-3

## 2017-12-19 NOTE — Telephone Encounter (Signed)
Pt calling, states she has been having a lot of leg cramping, cramping in her calves. Question if medication related. Will forward to MD for review Pt call back [place], RN

## 2017-12-20 ENCOUNTER — Ambulatory Visit: Payer: Medicare Other | Admitting: Sports Medicine

## 2017-12-25 ENCOUNTER — Encounter: Payer: Self-pay | Admitting: Physician Assistant

## 2017-12-31 ENCOUNTER — Telehealth: Payer: Self-pay | Admitting: Family Medicine

## 2017-12-31 NOTE — Telephone Encounter (Signed)
Pt called and said she is having issues with her feet and legs going numb and spasms in her calves. She was seen in the ED for this but it isnt going away. She said she has an appointment with her heart doctor on 01/15/18 and wants to know if she needs to be seen here before or if it is okay to wait until then. Pt would like to be contacted.

## 2018-01-01 NOTE — Telephone Encounter (Signed)
Called patient and advised that she be seen sooner. Scheduled her an appt on 5/13. She was appreciative of the call  Smitty Cords, MD [place], PGY-3

## 2018-01-06 ENCOUNTER — Ambulatory Visit (INDEPENDENT_AMBULATORY_CARE_PROVIDER_SITE_OTHER): Payer: Medicare Other | Admitting: Family Medicine

## 2018-01-06 ENCOUNTER — Encounter: Payer: Self-pay | Admitting: Family Medicine

## 2018-01-06 VITALS — BP 121/70 | HR 84 | Temp 98.2°F | Ht 64.0 in | Wt 273.0 lb

## 2018-01-06 DIAGNOSIS — R200 Anesthesia of skin: Secondary | ICD-10-CM | POA: Diagnosis not present

## 2018-01-06 DIAGNOSIS — Z114 Encounter for screening for human immunodeficiency virus [HIV]: Secondary | ICD-10-CM

## 2018-01-06 DIAGNOSIS — R202 Paresthesia of skin: Secondary | ICD-10-CM

## 2018-01-06 DIAGNOSIS — Z1329 Encounter for screening for other suspected endocrine disorder: Secondary | ICD-10-CM | POA: Diagnosis not present

## 2018-01-06 DIAGNOSIS — R739 Hyperglycemia, unspecified: Secondary | ICD-10-CM

## 2018-01-06 DIAGNOSIS — D509 Iron deficiency anemia, unspecified: Secondary | ICD-10-CM | POA: Diagnosis not present

## 2018-01-06 DIAGNOSIS — R7989 Other specified abnormal findings of blood chemistry: Secondary | ICD-10-CM | POA: Diagnosis not present

## 2018-01-06 DIAGNOSIS — D649 Anemia, unspecified: Secondary | ICD-10-CM | POA: Diagnosis not present

## 2018-01-06 LAB — POCT GLYCOSYLATED HEMOGLOBIN (HGB A1C): HEMOGLOBIN A1C: 5.5

## 2018-01-06 MED ORDER — PANTOPRAZOLE SODIUM 40 MG PO TBEC: 40.0000 mg | DELAYED_RELEASE_TABLET | Freq: Every day | ORAL | 1 refills | Status: DC

## 2018-01-06 NOTE — Progress Notes (Signed)
Subjective:    Patient ID: Kendra [name] , female   DOB: 12-Jan-1966 , 52 y.o..   MRN: [id number]  HPI  Kendra [name] is here for   1. Leg numbness/spasms: Started in her toes around April 10th, gradually, slowly it worked it's way up the outside of the left leg. It is getting worse and is constant. She does not have any feeling, there is also some tingling. No other extremeities are affected. No trouble with balance, but she is limping on the left leg because she can't feel it. She went to the ED on 4/20, DVT was ruled out with Doppler ultrasound. She notes that it is painful. Symptoms on worse when she lays on her left side, her whole leg will get numb. Denies any weakness. No alleviating factors. No leg swelling.   Review of Systems: Per HPI.   Past Medical History: Patient Active Problem List   Diagnosis Date Noted  . High risk medication use 02/18/2017  . Neck pain on left side 02/18/2017  . Abdominal pain 12/24/2016  . Swelling of upper lip 04/03/2016  . HTN (hypertension) 04/03/2016  . Leg swelling 02/15/2016  . Hemangioma of liver 07/07/2015  . Chest pain 05/06/2015  . Uterine fibroid 01/14/2015  . Dysfunctional uterine bleeding 01/14/2015  . Tendinopathy of right rotator cuff 08/24/2014  . Cervical neck pain with evidence of disc disease 05/14/2012  . Osteoarthrosis, unspecified whether generalized or localized, involving lower leg 05/14/2012  . Chronic low back pain 12/20/2010  . Morbid obesity ([place]) 04/07/2008  . ALCOHOL ABUSE 04/07/2008  . TOBACCO ABUSE 04/07/2008  . Iron deficiency anemia 10/24/2006  . GASTROESOPHAGEAL REFLUX, NO ESOPHAGITIS 10/24/2006    Medications: reviewed   Social Hx:  reports that she has been smoking cigarettes.  She started smoking about 15 years ago. She has been smoking about 1.50 packs per day. She has never used smokeless tobacco.   Objective:   BP 121/70   Pulse 84   Temp 98.2 F (36.8 C) (Oral)   Ht 5\' 4"  (1.626 m)   Wt  273 lb (123.8 kg)   LMP 02/21/2015 (Approximate)   SpO2 98%   BMI 46.86 kg/m  Physical Exam  Gen: NAD, alert, cooperative with exam, well-appearing Cardiac; 2+ DP pulses bilaterally Right leg: No abnormalities on inspection, nontender to palpation throughout, 5 out of 5 strength throughout, sensation grossly intact throughout. Left Leg: Well-healed vertical incisional scars on medial and lateral ankle.  Nontender to palpation throughout, 5 out of 5 strength throughout, sensation intact but decreased on the dorsum of the foot and lateral lower leg in compatison to the rest of the leg  Assessment & Plan:   1. Numbness and tingling of left lower extremity: Unclear etiology at this time.  Progressive onset since 1 month ago.  Numbness and tingling is on the dorsum of the foot and the lateral aspect of the left lower leg.  She is has normal strength and is neurovascularly intact however does endorse decreased sensation on the dorsum of the foot and lateral aspect of the leg in comparison to the rest of her leg.  No signs of diabetes as A1c is 5.5.  Less likely B12 deficient as her last CBC was normal.  Could possibly be thyroid etiology.  More likely this is a mechanical issue unclear what could be causing it as she has no history of recent trauma to her extremities.  She does have history of bilateral ankle surgery in the  1980s, wondering if this could be causing some nerve irritation. ?Peroneal nerve damage - Anemia panel - RPR - TSH - Advised for patient to wear a stable shoe such as a good sneaker, no flip-flops - Ambulatory referral to Neurology - Follow up in 1 month  2. Hyperglycemia: Mild hyperglycemia on previous labs.  Hemoglobin A1c normal at 5.5 - HgB A1c  3. Encounter for screening for HIV - HIV antibody (with reflex)   Smitty Cords, MD [place], PGY-3   Addendum: B12 came back normal, TSH slightly low at 0.327.  Called patient to discuss this. No  answer. Left voicemail. Will send a letter to her house.  She will need to return for lab visit to get this rechecked.

## 2018-01-06 NOTE — Patient Instructions (Signed)
Thank you for coming in today, it was so nice to see you! Today we talked about:    Left leg numbness and tingling; we are doing some blood work today to see if anything comes back abnormal.  I have also referred you to a neurology specialist, someone should call you within the next week to get this scheduled.  In the meantime wear sturdy shoes that you can easily walk in.  Try not to wear flip-flops as this will make it difficult.  Please follow up in 1 month. You can schedule this appointment at the front desk before you leave or call the clinic.  Bring in all your medications or supplements to each appointment for review.   If we ordered any tests today, you will be notified via telephone of any abnormalities. If everything is normal you will get a letter in the mail.   If you have any questions or concerns, please do not hesitate to call the office at [phone]. You can also message me directly via MyChart.   Sincerely,  Smitty Cords, MD

## 2018-01-07 ENCOUNTER — Encounter: Payer: Self-pay | Admitting: Family Medicine

## 2018-01-07 LAB — TSH: TSH: 0.327 u[IU]/mL — AB (ref 0.450–4.500)

## 2018-01-07 LAB — ANEMIA PANEL
FERRITIN: 120 ng/mL (ref 15–150)
FOLATE, RBC: 704 ng/mL (ref >498)
Folate, Hemolysate: 318.7 ng/mL
HEMATOCRIT: 45.3 % (ref 34.0–46.6)
Iron Saturation: 31 % (ref 15–55)
Iron: 95 ug/dL (ref 27–159)
RETIC CT PCT: 1.4 % (ref 0.6–2.6)
TIBC: 307 ug/dL (ref 250–450)
UIBC: 212 ug/dL (ref 131–425)
Vitamin B-12: 347 pg/mL (ref 232–1245)

## 2018-01-07 LAB — RPR: RPR: NONREACTIVE

## 2018-01-07 LAB — HIV ANTIBODY (ROUTINE TESTING W REFLEX): HIV Screen 4th Generation wRfx: NONREACTIVE

## 2018-01-09 ENCOUNTER — Encounter: Payer: Self-pay | Admitting: Neurology

## 2018-01-14 NOTE — Progress Notes (Signed)
Cardiology Office Note    Date:  01/15/2018   ID:  Kendra [name], DOB [birth year], MRN [id number]  PCP:  Carlyle Dolly, MD  Cardiologist:  New  Chief Complaint  Patient presents with  . New Patient (Initial Visit)    History of Present Illness:  Kendra [name] is a 52 y.o. female who is in the emergency room 12/14/2017 for chest pain and calf pain.  ER notes state the chest pain went on for 2 to 3 weeks sharp shooting and constant not exertional.  Worse when sitting up or laying down.  Doppler showed no evidence of DVT.  Troponins negative.  D-dimer positive CT did not show PE.  Did show evidence of CAD.  Discussed with cardiology and was told to start aspirin and follow-up with them in the office.  2D echo in 2017 normal LVEF 55 to 60% with mild LVH.  Patient comes in today accompanied by her mother. Complains of sharp shooting chest pain into neck and shoulder that lasts 4 hrs and comes and goes every few days. Associated with terrible heart burn and reflux-greasy food makes it worse. Activity doesn't change symptoms. smokes 1ppd for 15 yrs.No Htn  DM, Mother with questionable history of  CAD. But  HLD treated. Doesn't exercise b/c of left leg numbness.  Denies chest tightness, pressure, heaviness, dyspnea, dizziness, palpitations or presyncope.    Past Medical History:  Diagnosis Date  . Anemia   . Asthma   . Esophagitis   . GERD (gastroesophageal reflux disease)   . HTN (hypertension)     Past Surgical History:  Procedure Laterality Date  . ABDOMINAL HYSTERECTOMY  04/12/15  . ANKLE SURGERY Left   . CESAREAN SECTION    . CYSTOSCOPY N/A 04/12/2015   Procedure: CYSTOSCOPY;  Surgeon: Lavonia Drafts, MD;  Location: [facility];  Service: Gynecology;  Laterality: N/A;  . FINGER SURGERY    . TIBIA FRACTURE SURGERY    . TUBAL LIGATION      Current Medications: Current Meds  Medication Sig  . acetaminophen (TYLENOL 8 HOUR) 650 MG CR tablet Take 1 tablet (650 mg  total) by mouth every 8 (eight) hours as needed for pain.  Marland Kitchen albuterol (PROAIR HFA) 108 (90 Base) MCG/ACT inhaler inhale 2 puffs every 4 hours if needed wheezing or shortness of breath  . alum & mag hydroxide-simeth (MAALOX/MYLANTA) 200-200-20 MG/5ML suspension Take 30 mLs by mouth every 6 (six) hours as needed for indigestion or heartburn. Reported on 10/26/2015  . aspirin EC 81 MG tablet Take 1 tablet (81 mg total) by mouth daily.  Marland Kitchen atorvastatin (LIPITOR) 40 MG tablet Take 1 tablet (40 mg total) by mouth daily.  Marland Kitchen docusate sodium (COLACE) 100 MG capsule Take 1 capsule (100 mg total) by mouth 2 (two) times daily. (Patient taking differently: Take 100 mg by mouth 2 (two) times daily as needed for mild constipation. )  . furosemide (LASIX) 20 MG tablet take 1 tablet by mouth once daily  . gabapentin (NEURONTIN) 100 MG capsule Take 100 mg by mouth daily.  . traMADol (ULTRAM) 50 MG tablet Take 1 tablet (50 mg total) by mouth every 12 (twelve) hours as needed. (Patient taking differently: Take 50 mg by mouth every 12 (twelve) hours as needed (hip pain). )  . [DISCONTINUED] pantoprazole (PROTONIX) 40 MG tablet Take 1 tablet (40 mg total) by mouth daily.   Current Facility-Administered Medications for the 01/15/18 encounter (Office Visit) with Imogene Burn, PA-C  Medication  .  betamethasone acetate-betamethasone sodium phosphate (CELESTONE) injection 3 mg     Allergies:   Lisinopril   Social History   Socioeconomic History  . Marital status: Single    Spouse name: Not on file  . Number of children: 3  . Years of education: 48  . Highest education level: Not on file  Occupational History  . Occupation: diabled- retail  Social Needs  . Financial resource strain: Not on file  . Food insecurity:    Worry: Not on file    Inability: Not on file  . Transportation needs:    Medical: Not on file    Non-medical: Not on file  Tobacco Use  . Smoking status: Current Every Day Smoker     Packs/day: 1.50    Types: Cigarettes    Start date: 08/27/2002  . Smokeless tobacco: Never Used  . Tobacco comment: cut back to 4-5 cigs per day from 1ppd  Substance and Sexual Activity  . Alcohol use: Yes    Alcohol/week: 0.0 oz    Comment: 2 -40oz daily  . Drug use: No  . Sexual activity: Not Currently    Birth control/protection: None  Lifestyle  . Physical activity:    Days per week: Not on file    Minutes per session: Not on file  . Stress: Not on file  Relationships  . Social connections:    Talks on phone: Not on file    Gets together: Not on file    Attends religious service: Not on file    Active member of club or organization: Not on file    Attends meetings of clubs or organizations: Not on file    Relationship status: Not on file  Other Topics Concern  . Not on file  Social History Narrative   Health Care POA:    Emergency Contact: mother, Melany Guernsey (h) [phone]   End of Life Plan:    Who lives with you: self   Any pets: none   Diet: Pt reports not eating very much.  Drinks juice and alchole most days.    Exercise: Pt has not regular exercise routine.   Seatbelts: Pt reports wearing seatbelt when in vehicles.    Nancy Fetter Exposure/Protection:    Hobbies: watching TV           Family History:  The patient's family history includes Diabetes in her mother; Heart disease in her mother; Lung cancer (age of onset: 107) in her father.   ROS:   Please see the history of present illness.    Review of Systems  Constitution: Negative.  HENT: Negative.   Eyes: Negative.   Cardiovascular: Positive for chest pain.  Respiratory: Negative.   Hematologic/Lymphatic: Negative.   Musculoskeletal: Negative.  Negative for joint pain.  Gastrointestinal: Negative.   Genitourinary: Negative.   Neurological: Negative.    All other systems reviewed and are negative.   PHYSICAL EXAM:   VS:  BP 122/68   Pulse 80   Ht 5\' 4"  (1.626 m)   Wt 275 lb (124.7 kg)   LMP  02/21/2015 (Approximate)   BMI 47.20 kg/m   Physical Exam  GEN: Well nourished, well developed, in no acute distress  Neck: no JVD, carotid bruits, or masses Cardiac:RRR; no murmurs, rubs, or gallops  Respiratory:  clear to auscultation bilaterally, normal work of breathing GI: soft, nontender, nondistended, + BS Ext: without cyanosis, clubbing, or edema, Good distal pulses bilaterally Neuro:  Alert and Oriented x 3 Psych: euthymic mood,  full affect  Wt Readings from Last 3 Encounters:  01/15/18 275 lb (124.7 kg)  01/06/18 273 lb (123.8 kg)  12/14/17 290 lb (131.5 kg)      Studies/Labs Reviewed:   EKG:  EKG is  ordered today.  The ekg ordered today demonstrates sinus rhythm, normal EKG  Recent Labs: 12/14/2017: BUN 9; Creatinine, Ser 0.87; Hemoglobin 15.3; Platelets 251; Potassium 4.0; Sodium 138 01/06/2018: TSH 0.327   Lipid Panel    Component Value Date/Time   CHOL 175 02/18/2017 1418   TRIG 239 (H) 02/18/2017 1418   HDL 49 02/18/2017 1418   CHOLHDL 3.6 02/18/2017 1418   CHOLHDL 3.0 02/22/2016 1538   VLDL 41 (H) 02/22/2016 1538   LDLCALC 78 02/18/2017 1418    Additional studies/ records that were reviewed today include:  2D echo 2017Study Conclusions   - Left ventricle: The cavity size was normal. Wall thickness was   increased in a pattern of mild LVH. Systolic function was normal.   The estimated ejection fraction was in the range of 55% to 60%.   Wall motion was normal; there were no regional wall motion   abnormalities. Left ventricular diastolic function parameters   were normal. - Atrial septum: No defect or patent foramen ovale was identified.  Chest CT 4/2019IMPRESSION: 1. Negative for acute PE or thoracic aortic dissection. 2. Atherosclerosis, including coronary artery disease. Please note that although the presence of coronary artery calcium documents the presence of coronary artery disease, the severity of this disease and any potential stenosis  cannot be assessed on this non-gated CT examination. Assessment for potential risk factor modification, dietary therapy or pharmacologic therapy may be warranted, if clinically indicated.     FINDINGS: Cardiovascular: Right ventricle is nondilated. Satisfactory opacification of pulmonary arteries noted, and there is no evidence of pulmonary emboli. Mild scattered coronary calcifications. Adequate contrast opacification of the thoracic aorta with no evidence of dissection, aneurysm, or stenosis. There is bovine variant brachiocephalic arch anatomy without proximal stenosis. No significant atheromatous plaque.      ASSESSMENT:    1. Chest pain, unspecified type   2. TOBACCO ABUSE   3. Morbid obesity ([place])   4. Gastroesophageal reflux disease, esophagitis presence not specified      PLAN:  In order of problems listed above:  Chest pain CT 12/14/2017 mild scattered coronary calcification-chest pain atypical and sounds more GI.  Does have CV risk factors including ongoing tobacco abuse, obesity, HLD, and questionable family history of CAD.  Discussed with Dr. Rayann Heman who concurs that we will proceed with exercise Myoview to risk stratify.  Recommend she increase her Protonix and see a gastroenterologist.  Follow-up with [place] ending results of stress test.  Tobacco abuse continues to smoke a pack of cigarettes daily.  Smoking cessation discussed in detail.  Morbid obesity weight loss recommended.  She has lost 25 pounds since she is had this reflux.  Needs to see GI.  GERD with 25 pound weight loss.  Think her chest pain is most likely coming from this.  See GI.  Increase Protonix in the interim.    Medication Adjustments/Labs and Tests Ordered: Current medicines are reviewed at length with the patient today.  Concerns regarding medicines are outlined above.  Medication changes, Labs and Tests ordered today are listed in the Patient Instructions below. Patient Instructions    Medication Instructions:  Your physician has recommended you make the following change in your medication: 1.  INCREASE the Protonix to 40 mg taking 1  tablet twice a day   Labwork: None ordered  Testing/Procedures: Your physician has requested that you have en exercise stress myoview. For further information please visit [email or web address]. Please follow instruction sheet, as given.    Follow-Up: Your physician recommends that you schedule a follow-up appointment in: DEPENDING ON TEST RESULTS   Any Other Special Instructions Will Be Listed Below (If Applicable). \ Steps to Quit Smoking Smoking tobacco can be bad for your health. It can also affect almost every organ in your body. Smoking puts you and people around you at risk for many serious long-lasting (chronic) diseases. Quitting smoking is hard, but it is one of the best things that you can do for your health. It is never too late to quit. What are the benefits of quitting smoking? When you quit smoking, you lower your risk for getting serious diseases and conditions. They can include:  Lung cancer or lung disease.  Heart disease.  Stroke.  Heart attack.  Not being able to have children (infertility).  Weak bones (osteoporosis) and broken bones (fractures).  If you have coughing, wheezing, and shortness of breath, those symptoms may get better when you quit. You may also get sick less often. If you are pregnant, quitting smoking can help to lower your chances of having a baby of low birth weight. What can I do to help me quit smoking? Talk with your doctor about what can help you quit smoking. Some things you can do (strategies) include:  Quitting smoking totally, instead of slowly cutting back how much you smoke over a period of time.  Going to in-person counseling. You are more likely to quit if you go to many counseling sessions.  Using resources and support systems, such as: ? Database administrator with a  Social worker. ? Phone quitlines. ? Careers information officer. ? Support groups or group counseling. ? Text messaging programs. ? Mobile phone apps or applications.  Taking medicines. Some of these medicines may have nicotine in them. If you are pregnant or breastfeeding, do not take any medicines to quit smoking unless your doctor says it is okay. Talk with your doctor about counseling or other things that can help you.  Talk with your doctor about using more than one strategy at the same time, such as taking medicines while you are also going to in-person counseling. This can help make quitting easier. What things can I do to make it easier to quit? Quitting smoking might feel very hard at first, but there is a lot that you can do to make it easier. Take these steps:  Talk to your family and friends. Ask them to support and encourage you.  Call phone quitlines, reach out to support groups, or work with a Social worker.  Ask people who smoke to not smoke around you.  Avoid places that make you want (trigger) to smoke, such as: ? Bars. ? Parties. ? Smoke-break areas at work.  Spend time with people who do not smoke.  Lower the stress in your life. Stress can make you want to smoke. Try these things to help your stress: ? Getting regular exercise. ? Deep-breathing exercises. ? Yoga. ? Meditating. ? Doing a body scan. To do this, close your eyes, focus on one area of your body at a time from head to toe, and notice which parts of your body are tense. Try to relax the muscles in those areas.  Download or buy apps on your mobile phone or tablet that can help you stick  to your quit plan. There are many free apps, such as QuitGuide from the [employer] Office manager for Disease Control and Prevention). You can find more support from smokefree.gov and other websites.  This information is not intended to replace advice given to you by your health care provider. Make sure you discuss any questions you have  with your health care provider. Document Released: 06/09/2009 Document Revised: 04/10/2016 Document Reviewed: 12/28/2014 Elsevier Interactive Patient Education  [employer].     If you need a refill on your cardiac medications before your next appointment, please call your pharmacy.      Sumner Boast, PA-C  01/15/2018 10:08 AM    [facility] [address], [place], [place]  [address] Phone: [phone]; Fax: [phone]

## 2018-01-15 ENCOUNTER — Encounter: Payer: Self-pay | Admitting: Physician Assistant

## 2018-01-15 ENCOUNTER — Encounter: Payer: Self-pay | Admitting: *Deleted

## 2018-01-15 ENCOUNTER — Ambulatory Visit (INDEPENDENT_AMBULATORY_CARE_PROVIDER_SITE_OTHER): Payer: Medicare Other | Admitting: Physician Assistant

## 2018-01-15 ENCOUNTER — Encounter (INDEPENDENT_AMBULATORY_CARE_PROVIDER_SITE_OTHER): Payer: Self-pay

## 2018-01-15 VITALS — BP 122/68 | HR 80 | Ht 64.0 in | Wt 275.0 lb

## 2018-01-15 DIAGNOSIS — R079 Chest pain, unspecified: Secondary | ICD-10-CM

## 2018-01-15 DIAGNOSIS — K219 Gastro-esophageal reflux disease without esophagitis: Secondary | ICD-10-CM | POA: Diagnosis not present

## 2018-01-15 DIAGNOSIS — F17200 Nicotine dependence, unspecified, uncomplicated: Secondary | ICD-10-CM | POA: Diagnosis not present

## 2018-01-15 MED ORDER — PANTOPRAZOLE SODIUM 40 MG PO TBEC: 40.0000 mg | DELAYED_RELEASE_TABLET | Freq: Two times a day (BID) | ORAL | 11 refills | Status: DC

## 2018-01-15 NOTE — Patient Instructions (Addendum)
Medication Instructions:  Your physician has recommended you make the following change in your medication: 1.  INCREASE the Protonix to 40 mg taking 1 tablet twice a day   Labwork: None ordered  Testing/Procedures: Your physician has requested that you have en exercise stress myoview. For further information please visit [email or web address]. Please follow instruction sheet, as given.    Follow-Up: Your physician recommends that you schedule a follow-up appointment in: DEPENDING ON TEST RESULTS   Any Other Special Instructions Will Be Listed Below (If Applicable). \ Steps to Quit Smoking Smoking tobacco can be bad for your health. It can also affect almost every organ in your body. Smoking puts you and people around you at risk for many serious long-lasting (chronic) diseases. Quitting smoking is hard, but it is one of the best things that you can do for your health. It is never too late to quit. What are the benefits of quitting smoking? When you quit smoking, you lower your risk for getting serious diseases and conditions. They can include:  Lung cancer or lung disease.  Heart disease.  Stroke.  Heart attack.  Not being able to have children (infertility).  Weak bones (osteoporosis) and broken bones (fractures).  If you have coughing, wheezing, and shortness of breath, those symptoms may get better when you quit. You may also get sick less often. If you are pregnant, quitting smoking can help to lower your chances of having a baby of low birth weight. What can I do to help me quit smoking? Talk with your doctor about what can help you quit smoking. Some things you can do (strategies) include:  Quitting smoking totally, instead of slowly cutting back how much you smoke over a period of time.  Going to in-person counseling. You are more likely to quit if you go to many counseling sessions.  Using resources and support systems, such as: ? Database administrator with a  Social worker. ? Phone quitlines. ? Careers information officer. ? Support groups or group counseling. ? Text messaging programs. ? Mobile phone apps or applications.  Taking medicines. Some of these medicines may have nicotine in them. If you are pregnant or breastfeeding, do not take any medicines to quit smoking unless your doctor says it is okay. Talk with your doctor about counseling or other things that can help you.  Talk with your doctor about using more than one strategy at the same time, such as taking medicines while you are also going to in-person counseling. This can help make quitting easier. What things can I do to make it easier to quit? Quitting smoking might feel very hard at first, but there is a lot that you can do to make it easier. Take these steps:  Talk to your family and friends. Ask them to support and encourage you.  Call phone quitlines, reach out to support groups, or work with a Social worker.  Ask people who smoke to not smoke around you.  Avoid places that make you want (trigger) to smoke, such as: ? Bars. ? Parties. ? Smoke-break areas at work.  Spend time with people who do not smoke.  Lower the stress in your life. Stress can make you want to smoke. Try these things to help your stress: ? Getting regular exercise. ? Deep-breathing exercises. ? Yoga. ? Meditating. ? Doing a body scan. To do this, close your eyes, focus on one area of your body at a time from head to toe, and notice which parts of your body are  tense. Try to relax the muscles in those areas.  Download or buy apps on your mobile phone or tablet that can help you stick to your quit plan. There are many free apps, such as QuitGuide from the [employer] Office manager for Disease Control and Prevention). You can find more support from smokefree.gov and other websites.  This information is not intended to replace advice given to you by your health care provider. Make sure you discuss any questions you have  with your health care provider. Document Released: 06/09/2009 Document Revised: 04/10/2016 Document Reviewed: 12/28/2014 Elsevier Interactive Patient Education  [employer].     If you need a refill on your cardiac medications before your next appointment, please call your pharmacy.

## 2018-01-21 ENCOUNTER — Ambulatory Visit (HOSPITAL_COMMUNITY): Payer: Medicare Other

## 2018-01-22 ENCOUNTER — Ambulatory Visit (HOSPITAL_COMMUNITY): Payer: Medicare Other

## 2018-01-23 ENCOUNTER — Telehealth (HOSPITAL_COMMUNITY): Payer: Self-pay | Admitting: *Deleted

## 2018-01-23 NOTE — Telephone Encounter (Signed)
Left message on voicemail per DPR in reference to upcoming appointment scheduled on 01/28/18 with detailed instructions given per Myocardial Perfusion Study Information Sheet for the test. LM to arrive 15 minutes early, and that it is imperative to arrive on time for appointment to keep from having the test rescheduled. If you need to cancel or reschedule your appointment, please call the office within 24 hours of your appointment. Failure to do so may result in a cancellation of your appointment, and a $50 no show fee. Phone number given for call back for any questions. Kirstie Peri

## 2018-01-28 ENCOUNTER — Ambulatory Visit (HOSPITAL_COMMUNITY): Payer: Medicare Other | Attending: Cardiology

## 2018-01-28 ENCOUNTER — Encounter (INDEPENDENT_AMBULATORY_CARE_PROVIDER_SITE_OTHER): Payer: Self-pay

## 2018-01-28 DIAGNOSIS — R9439 Abnormal result of other cardiovascular function study: Secondary | ICD-10-CM | POA: Insufficient documentation

## 2018-01-28 DIAGNOSIS — Z6841 Body Mass Index (BMI) 40.0 and over, adult: Secondary | ICD-10-CM | POA: Diagnosis not present

## 2018-01-28 DIAGNOSIS — E669 Obesity, unspecified: Secondary | ICD-10-CM | POA: Diagnosis not present

## 2018-01-28 DIAGNOSIS — R079 Chest pain, unspecified: Secondary | ICD-10-CM

## 2018-01-28 DIAGNOSIS — R0609 Other forms of dyspnea: Secondary | ICD-10-CM | POA: Insufficient documentation

## 2018-01-28 DIAGNOSIS — I2510 Atherosclerotic heart disease of native coronary artery without angina pectoris: Secondary | ICD-10-CM | POA: Insufficient documentation

## 2018-01-28 MED ORDER — TECHNETIUM TC 99M TETROFOSMIN IV KIT
32.7000 | PACK | Freq: Once | INTRAVENOUS | Status: AC | PRN
  Administered 2018-01-28: 32.7 via INTRAVENOUS
  Filled 2018-01-28: qty 33

## 2018-01-28 MED ORDER — REGADENOSON 0.4 MG/5ML IV SOLN
0.4000 mg | Freq: Once | INTRAVENOUS | Status: AC
  Administered 2018-01-28: 0.4 mg via INTRAVENOUS

## 2018-01-29 ENCOUNTER — Ambulatory Visit (HOSPITAL_COMMUNITY): Payer: Medicare Other | Attending: Cardiovascular Disease

## 2018-01-29 MED ORDER — TECHNETIUM TC 99M TETROFOSMIN IV KIT
31.2000 | PACK | Freq: Once | INTRAVENOUS | Status: AC | PRN
  Administered 2018-01-29: 31.2 via INTRAVENOUS
  Filled 2018-01-29: qty 32

## 2018-01-30 LAB — MYOCARDIAL PERFUSION IMAGING
CHL CUP NUCLEAR SSS: 10
LV dias vol: 82 mL (ref 46–106)
LV sys vol: 33 mL
NUC STRESS TID: 0.96
Peak HR: 118 {beats}/min
RATE: 0.42
Rest HR: 98 {beats}/min
SDS: 5
SRS: 5

## 2018-02-13 ENCOUNTER — Other Ambulatory Visit: Payer: Self-pay | Admitting: Family Medicine

## 2018-03-14 ENCOUNTER — Ambulatory Visit (INDEPENDENT_AMBULATORY_CARE_PROVIDER_SITE_OTHER): Payer: Medicare Other | Admitting: Neurology

## 2018-03-14 ENCOUNTER — Encounter: Payer: Self-pay | Admitting: Neurology

## 2018-03-14 VITALS — BP 118/60 | HR 98 | Ht 64.0 in | Wt 272.4 lb

## 2018-03-14 DIAGNOSIS — M5417 Radiculopathy, lumbosacral region: Secondary | ICD-10-CM

## 2018-03-14 DIAGNOSIS — F17210 Nicotine dependence, cigarettes, uncomplicated: Secondary | ICD-10-CM

## 2018-03-14 DIAGNOSIS — Z720 Tobacco use: Secondary | ICD-10-CM

## 2018-03-14 MED ORDER — GABAPENTIN 300 MG PO CAPS: 300.0000 mg | ORAL_CAPSULE | Freq: Every day | ORAL | 5 refills | Status: DC

## 2018-03-14 NOTE — Patient Instructions (Addendum)
Start gabapentin 300mg  at bedtime  NCS/EMG of the left leg  Start out-patient physical therapy for low back strengthening  Return to clinic in 4 months

## 2018-03-14 NOTE — Progress Notes (Signed)
Kendra [name] - Initial Visit   Date: 03/14/18  Kendra [name] MRN: [id number] DOB: 08-11-1966   Dear Dr. Juanito Doom:  Thank you for your kind referral of Kendra [name] for consultation of left leg numbness/tingling. Although her history is well known to you, please allow Kendra [name] to reiterate it for the purpose of our medical record. The patient was accompanied to the clinic by self.   History of Present Illness: Kendra [name] is a 52 y.o. right-handed African American female with hypertension, GERD, and tobacco abuse presenting for evaluation of left leg numbness/tingling.    Starting in January 2019, she began having tingling and numbness of the toes which has gradually radiated up her lower lateral leg, and posteroir thigh. Symptoms are constant, there are no specific triggers or alleviating factors.  There was no preceding back injury or illness.  She endorses low back pain. She has noticed mild weakness of the left foot.  No associated falls or difficulty with walking.  She does not have similar symptoms on the right leg.   Out-side paper records, electronic medical record, and images have been reviewed where available and summarized as:  Lab Results  Component Value Date   TSH 0.327 (L) 01/06/2018   Lab Results  Component Value Date   PZWCHENI77 824 01/06/2018   Lab Results  Component Value Date   FOLATE 7.4 12/31/2013   Lab Results  Component Value Date   HGBA1C 5.5 01/06/2018     Past Medical History:  Diagnosis Date  . Anemia   . Asthma   . Esophagitis   . GERD (gastroesophageal reflux disease)   . HTN (hypertension)     Past Surgical History:  Procedure Laterality Date  . ABDOMINAL HYSTERECTOMY  04/12/15  . ANKLE SURGERY Left   . CESAREAN SECTION    . CYSTOSCOPY N/A 04/12/2015   Procedure: CYSTOSCOPY;  Surgeon: Lavonia Drafts, MD;  Location: [facility];  Service: Gynecology;  Laterality: N/A;  . FINGER SURGERY    .  TIBIA FRACTURE SURGERY    . TUBAL LIGATION       Medications:  Outpatient Encounter Medications as of 03/14/2018  Medication Sig  . acetaminophen (TYLENOL 8 HOUR) 650 MG CR tablet Take 1 tablet (650 mg total) by mouth every 8 (eight) hours as needed for pain.  Marland Kitchen albuterol (PROAIR HFA) 108 (90 Base) MCG/ACT inhaler inhale 2 puffs every 4 hours if needed wheezing or shortness of breath  . alum & mag hydroxide-simeth (MAALOX/MYLANTA) 200-200-20 MG/5ML suspension Take 30 mLs by mouth every 6 (six) hours as needed for indigestion or heartburn. Reported on 10/26/2015  . aspirin EC 81 MG tablet Take 1 tablet (81 mg total) by mouth daily.  Marland Kitchen atorvastatin (LIPITOR) 40 MG tablet Take 1 tablet (40 mg total) by mouth daily.  Marland Kitchen docusate sodium (COLACE) 100 MG capsule Take 1 capsule (100 mg total) by mouth 2 (two) times daily. (Patient taking differently: Take 100 mg by mouth 2 (two) times daily as needed for mild constipation. )  . furosemide (LASIX) 20 MG tablet take 1 tablet by mouth once daily  . lisinopril (PRINIVIL,ZESTRIL) 10 MG tablet Take 10 mg by mouth daily.  . pantoprazole (PROTONIX) 40 MG tablet TAKE 1 TABLET BY MOUTH DAILY  . traMADol (ULTRAM) 50 MG tablet Take 1 tablet (50 mg total) by mouth every 12 (twelve) hours as needed. (Patient taking differently: Take 50 mg by mouth every 12 (twelve) hours as needed (hip pain). )  . [  DISCONTINUED] gabapentin (NEURONTIN) 100 MG capsule Take 100 mg by mouth 2 (two) times daily.   Marland Kitchen gabapentin (NEURONTIN) 300 MG capsule Take 1 capsule (300 mg total) by mouth at bedtime.   Facility-Administered Encounter Medications as of 03/14/2018  Medication  . betamethasone acetate-betamethasone sodium phosphate (CELESTONE) injection 3 mg     Allergies:  Allergies  Allergen Reactions  . Lisinopril Swelling    Family History: Family History  Problem Relation Age of Onset  . Diabetes Mother   . Heart disease Mother   . Lung cancer Father 69  . Diabetes  Sister     Social History: Social History   Tobacco Use  . Smoking status: Current Every Day Smoker    Packs/day: 1.50    Types: Cigarettes    Start date: 08/27/2002  . Smokeless tobacco: Never Used  . Tobacco comment: cut back to 4-5 cigs per day from 1ppd  Substance Use Topics  . Alcohol use: Yes    Alcohol/week: 0.0 oz    Comment: 2 -40oz daily,  24oz daily.    . Drug use: No   Social History   Social History Narrative   Health Care POA:    Emergency Contact: mother, Melany [place]ernsey (h) [phone]   End of Life Plan:    Who lives with you: self   Any pets: none   Diet: Pt reports not eating very much.  Drinks juice and alcohol most days.    Exercise: Pt has not regular exercise routine.   Seatbelts: Pt reports wearing seatbelt when in vehicles.    Sun Exposure/Protection:    Hobbies: watching TV   Lives with son in a one story home.  Has 3 children.  On disability for arthritis.  Education: high school.    She last worked in 2013 at [employer].         Review of Systems:  CONSTITUTIONAL: No fevers, chills, night sweats, or weight loss.   EYES: No visual changes or eye pain ENT: No hearing changes.  No history of nose bleeds.   RESPIRATORY: No cough, wheezing and shortness of breath.   CARDIOVASCULAR: Negative for chest pain, and palpitations.   GI: Negative for abdominal discomfort, blood in stools or black stools.  No recent change in bowel habits.   GU:  No history of incontinence.   MUSCLOSKELETAL: +history of joint pain or swelling.  No myalgias.   SKIN: Negative for lesions, rash, and itching.   HEMATOLOGY/ONCOLOGY: Negative for prolonged bleeding, bruising easily, and swollen nodes.  No history of cancer.   ENDOCRINE: Negative for cold or heat intolerance, polydipsia or goiter.   PSYCH:  No depression or anxiety symptoms.   NEURO: As Above.   Vital Signs:  BP 118/60   Pulse 98   Ht 5\' 4"  (1.626 m)   Wt 272 lb 6 oz (123.5 kg)   LMP 02/21/2015  (Approximate)   SpO2 98%   BMI 46.75 kg/m    General Medical Exam:   General:  Well appearing, comfortable.   Eyes/ENT: see cranial nerve examination.   Neck: No masses appreciated.  Full range of motion without tenderness.  No carotid bruits. Respiratory:  Clear to auscultation, good air entry bilaterally.   Cardiac:  Regular rate and rhythm, no murmur.   Extremities:  No deformities, edema, or skin discoloration.  Skin:  No rashes or lesions.  Neurological Exam: MENTAL STATUS including orientation to time, place, person, recent and remote memory, attention span and concentration, language, and  fund of knowledge is normal.  Speech is not dysarthric.  CRANIAL NERVES: II:  No visual field defects.  Unremarkable fundi.   III-IV-VI: Pupils equal round and reactive to light.  Normal conjugate, extra-ocular eye movements in all directions of gaze.  No nystagmus.  No ptosis.   V:  Normal facial sensation.     VII:  Normal facial symmetry and movements.  VIII:  Normal hearing and vestibular function.   IX-X:  Normal palatal movement.   XI:  Normal shoulder shrug and head rotation.   XII:  Normal tongue strength and range of motion, no deviation or fasciculation.  MOTOR:  No atrophy, fasciculations or abnormal movements.  No pronator drift.  Tone is normal.    Right Upper Extremity:    Left Upper Extremity:    Deltoid  5/5   Deltoid  5/5   Biceps  5/5   Biceps  5/5   Triceps  5/5   Triceps  5/5   Wrist extensors  5/5   Wrist extensors  5/5   Wrist flexors  5/5   Wrist flexors  5/5   Finger extensors  5/5   Finger extensors  5/5   Finger flexors  5/5   Finger flexors  5/5   Dorsal interossei  5/5   Dorsal interossei  5/5   Abductor pollicis  5/5   Abductor pollicis  5/5   Tone (Ashworth scale)  0  Tone (Ashworth scale)  0   Right Lower Extremity:    Left Lower Extremity:    Hip flexors  5/5   Hip flexors  5/5   Hip extensors  5/5   Hip extensors  5/5   Knee flexors  5/5   Knee  flexors  5/5   Knee extensors  5/5   Knee extensors  5/5   Dorsiflexors  5/5   Dorsiflexors  5/5   Plantarflexors  5/5   Plantarflexors  5/5   Toe extensors  5/5   Toe extensors  5/5   Toe flexors  5/5   Toe flexors  5/5   Tone (Ashworth scale)  0  Tone (Ashworth scale)  0   MSRs:  Right                                                                 Left brachioradialis 2+  brachioradialis 2+  biceps 2+  biceps 2+  triceps 2+  triceps 2+  patellar 2+  patellar 2+  ankle jerk 2+  ankle jerk 0  Hoffman no  Hoffman no  plantar response down  plantar response down   SENSORY:  Temperature and pin prick is reduced over the lateral left foot, posterior-lateral left lower leg and posterior thigh.  Sensation is normal in the right leg and arms.  COORDINATION/GAIT: Normal finger-to- nose-finger.  Intact rapid alternating movements bilaterally. Gait is antalgic due to bilateral knee pain.  IMPRESSION: 1.  Left leg paresthesias most likely due to S1 radiculopathy given the dermatomal sensory changes and asymmetrical absence of Achilles reflex.   - NCS/EMG of the left leg   - Start PT for low back strengthening and stretching  - Increase gabapentin 300mg  at bedtime  2.  Tobacco cessation counseling.  She is currently smoking 0.5 packs/day and  expresses interest in quitting.  Patient was informed of the dangers of tobacco abuse including stroke, cancer, and MI, as well as benefits of tobacco cessation. Approximately 4 mins were spent counseling patient cessation techniques. We discussed various methods to help quit smoking, including deciding on a date to quit, joining a support group, pharmacological agents- nicotine gum/patch/lozenges, chantix. Progressed will be reassess at her next visit.   Return to clinic in 4 months, or sooner as needed     Thank you for allowing me to participate in patient's care.  If I can answer any additional questions, I would be pleased to do so.     Sincerely,    Ab Leaming K. Posey Pronto, DO

## 2018-03-24 DIAGNOSIS — M5416 Radiculopathy, lumbar region: Secondary | ICD-10-CM | POA: Diagnosis not present

## 2018-03-24 DIAGNOSIS — M545 Low back pain: Secondary | ICD-10-CM | POA: Diagnosis not present

## 2018-03-27 DIAGNOSIS — M5416 Radiculopathy, lumbar region: Secondary | ICD-10-CM | POA: Diagnosis not present

## 2018-03-27 DIAGNOSIS — M545 Low back pain: Secondary | ICD-10-CM | POA: Diagnosis not present

## 2018-04-01 ENCOUNTER — Ambulatory Visit (INDEPENDENT_AMBULATORY_CARE_PROVIDER_SITE_OTHER): Payer: Medicare Other | Admitting: Neurology

## 2018-04-01 DIAGNOSIS — M5417 Radiculopathy, lumbosacral region: Secondary | ICD-10-CM

## 2018-04-01 NOTE — Procedures (Signed)
[facility]  [address], [place] [address]  [place], [place] [address] Tel: [phone] Fax:  [phone] Test Date:  04/01/2018  Patient: Kendra [name] DOB: [birth year] Physician: Narda Amber, DO  Sex: Female Height: 5\' 4"  Ref Phys: Narda Amber, DO  ID#: [id number] Temp: 33.8C Technician:    Patient Complaints: This is a 52 year old female referred for evaluation of left leg pain and paresthesia.  NCV & EMG Findings: Extensive electrodiagnostic testing of the left lower extremity shows:  1. Left sural and superficial peroneal sensory responses are within normal limits. 2. Left peroneal motor response at the extensor digitorum brevis is absent, and normal at the tibialis anterior. Left tibial motor responses within normal limits. 3. Left tibial H reflex study is within normal limits. 4. Chronic motor axon loss changes are seen affecting the medial gastrocnemius and biceps femoris short head muscles, without accompanied active denervation.  Impression: Chronic S1 radiculopathy affecting the left lower extremity, mild in degree electrically.   ___________________________ Narda Amber, DO    Nerve Conduction Studies Anti Sensory Summary Table   Site NR Peak (ms) Norm Peak (ms) P-T Amp (V) Norm P-T Amp  Left Sup Peroneal Anti Sensory (Ant Lat Mall)  33.8C  12 cm    2.5 <4.6 10.0 >4  Left Sural Anti Sensory (Lat Mall)  33.8C  Calf    3.3 <4.6 10.4 >4   Motor Summary Table   Site NR Onset (ms) Norm Onset (ms) O-P Amp (mV) Norm O-P Amp Site1 Site2 Delta-0 (ms) Dist (cm) Vel (m/s) Norm Vel (m/s)  Left Peroneal Motor (Ext Dig Brev)  33.8C  Ankle NR  <6.0  >2.5 B Fib Ankle  0.0  >40  B Fib NR     Poplt B Fib  0.0  >40  Poplt NR            Left Peroneal TA Motor (Tib Ant)  33.8C  Fib Head    2.0 <4.5 3.2 >3 Poplit Fib Head 1.7 8.0 47 >40  Poplit    3.7  2.7         Left Tibial Motor (Abd Hall Brev)  33.8C  Ankle    5.4 <6.0 5.0 >4 Knee Ankle 7.7 40.0 52 >40  Knee     13.1  4.7          H Reflex Studies   NR H-Lat (ms) Lat Norm (ms) L-R H-Lat (ms)  Left Tibial (Gastroc)  33.8C     34.29 <35    EMG   Side Muscle Ins Act Fibs Psw Fasc Number Recrt Dur Dur. Amp Amp. Poly Poly. Comment  Left AntTibialis Nml Nml Nml Nml Nml Nml Nml Nml Nml Nml Nml Nml N/A  Left Gastroc Nml Nml Nml Nml 1- Rapid Some 1+ Some 1+ Nml Nml N/A  Left Flex Dig Long Nml Nml Nml Nml Nml Nml Nml Nml Nml Nml Nml Nml N/A  Left RectFemoris Nml Nml Nml Nml Nml Nml Nml Nml Nml Nml Nml Nml N/A  Left BicepsFemS Nml Nml Nml Nml 1- Rapid Some 1+ Some 1+ Nml Nml N/A      Waveforms:

## 2018-04-01 NOTE — Progress Notes (Signed)
Follow-up Visit   Date: 04/01/18    Kendra [name] MRN: [id number] DOB: 1965/09/17   Interim History: Kendra [name] is a 52 y.o. right-handed African American female with hypertension, GERD, and tobacco abuse  returning to the clinic for electrodiagnostic testing of the left lower extremity.  History of present illness: Starting in January 2019, she began having tingling and numbness of the toes which has gradually radiated up her lower lateral leg, and posteroir thigh. Symptoms are constant, there are no specific triggers or alleviating factors.  There was no preceding back injury or illness.  She endorses low back pain. She has noticed mild weakness of the left foot.  No associated falls or difficulty with walking.  She does not have similar symptoms on the right leg.   UPDATE 04/01/2018:  She is here to undergo electrodiagnostic testing as well as follow-up visit. There has been no marked change in her left leg numbness and tingling. She started physical therapy last week, and it is too early to appreciate any benefit.  Medications:  Current Outpatient Medications on File Prior to Visit  Medication Sig Dispense Refill  . acetaminophen (TYLENOL 8 HOUR) 650 MG CR tablet Take 1 tablet (650 mg total) by mouth every 8 (eight) hours as needed for pain. 30 tablet 1  . albuterol (PROAIR HFA) 108 (90 Base) MCG/ACT inhaler inhale 2 puffs every 4 hours if needed wheezing or shortness of breath 8.5 g 3  . alum & mag hydroxide-simeth (MAALOX/MYLANTA) 200-200-20 MG/5ML suspension Take 30 mLs by mouth every 6 (six) hours as needed for indigestion or heartburn. Reported on 10/26/2015    . aspirin EC 81 MG tablet Take 1 tablet (81 mg total) by mouth daily. 90 tablet prn  . atorvastatin (LIPITOR) 40 MG tablet Take 1 tablet (40 mg total) by mouth daily. 90 tablet 3  . docusate sodium (COLACE) 100 MG capsule Take 1 capsule (100 mg total) by mouth 2 (two) times daily. (Patient taking differently: Take  100 mg by mouth 2 (two) times daily as needed for mild constipation. ) 10 capsule 0  . furosemide (LASIX) 20 MG tablet take 1 tablet by mouth once daily 90 tablet 1  . gabapentin (NEURONTIN) 300 MG capsule Take 1 capsule (300 mg total) by mouth at bedtime. 30 capsule 5  . lisinopril (PRINIVIL,ZESTRIL) 10 MG tablet Take 10 mg by mouth daily.  2  . pantoprazole (PROTONIX) 40 MG tablet TAKE 1 TABLET BY MOUTH DAILY 90 tablet 1  . traMADol (ULTRAM) 50 MG tablet Take 1 tablet (50 mg total) by mouth every 12 (twelve) hours as needed. (Patient taking differently: Take 50 mg by mouth every 12 (twelve) hours as needed (hip pain). ) 60 tablet 1   Current Facility-Administered Medications on File Prior to Visit  Medication Dose Route Frequency Provider Last Rate Last Dose  . betamethasone acetate-betamethasone sodium phosphate (CELESTONE) injection 3 mg  3 mg Intramuscular Once Edrick Kins, DPM        Allergies:  Allergies  Allergen Reactions  . Lisinopril Swelling    Formal neurological exam deferred  Data: NCS/EMG of the left leg 04/01/2018:  Chronic S1 radiculopathy affecting the left lower extremity, mild in degree electrically  IMPRESSION/PLAN: Chronic S1 radiculopathy affecting the left lower extremity manifesting predominantly with sensory disturbance. Results of her electrodiagnostic testing was discussed. Recommend that she continue physical therapy.  Continue gabapentin 300 mg at bedtime. If there is no improvement with PT, the next would  be MRI lumbar spine.  Return to clinic in 3 months.   Thank you for allowing me to participate in patient's care.  If I can answer any additional questions, I would be pleased to do so.    Sincerely,    Donika K. Posey Pronto, DO

## 2018-04-03 DIAGNOSIS — M5416 Radiculopathy, lumbar region: Secondary | ICD-10-CM | POA: Diagnosis not present

## 2018-04-03 DIAGNOSIS — M545 Low back pain: Secondary | ICD-10-CM | POA: Diagnosis not present

## 2018-04-07 DIAGNOSIS — M5416 Radiculopathy, lumbar region: Secondary | ICD-10-CM | POA: Diagnosis not present

## 2018-04-07 DIAGNOSIS — M545 Low back pain: Secondary | ICD-10-CM | POA: Diagnosis not present

## 2018-04-10 DIAGNOSIS — M5416 Radiculopathy, lumbar region: Secondary | ICD-10-CM | POA: Diagnosis not present

## 2018-04-10 DIAGNOSIS — M545 Low back pain: Secondary | ICD-10-CM | POA: Diagnosis not present

## 2018-04-18 DIAGNOSIS — M5416 Radiculopathy, lumbar region: Secondary | ICD-10-CM | POA: Diagnosis not present

## 2018-04-18 DIAGNOSIS — M545 Low back pain: Secondary | ICD-10-CM | POA: Diagnosis not present

## 2018-04-21 DIAGNOSIS — M5416 Radiculopathy, lumbar region: Secondary | ICD-10-CM | POA: Diagnosis not present

## 2018-04-21 DIAGNOSIS — M545 Low back pain: Secondary | ICD-10-CM | POA: Diagnosis not present

## 2018-04-22 ENCOUNTER — Telehealth: Payer: Self-pay | Admitting: Family Medicine

## 2018-04-22 DIAGNOSIS — E0590 Thyrotoxicosis, unspecified without thyrotoxic crisis or storm: Secondary | ICD-10-CM

## 2018-04-22 NOTE — Telephone Encounter (Signed)
Called Mrs. Minix with requested results from May. Repeat TSH ordered.  Nursing- please schedule with appointment with me in next 1-2 months.   Thanks,  [employer]

## 2018-04-24 DIAGNOSIS — M5416 Radiculopathy, lumbar region: Secondary | ICD-10-CM | POA: Diagnosis not present

## 2018-04-24 DIAGNOSIS — M545 Low back pain: Secondary | ICD-10-CM | POA: Diagnosis not present

## 2018-04-25 ENCOUNTER — Other Ambulatory Visit: Payer: Self-pay | Admitting: Obstetrics & Gynecology

## 2018-04-25 DIAGNOSIS — Z1231 Encounter for screening mammogram for malignant neoplasm of breast: Secondary | ICD-10-CM

## 2018-05-01 DIAGNOSIS — M5416 Radiculopathy, lumbar region: Secondary | ICD-10-CM | POA: Diagnosis not present

## 2018-05-01 DIAGNOSIS — M545 Low back pain: Secondary | ICD-10-CM | POA: Diagnosis not present

## 2018-05-05 DIAGNOSIS — M5416 Radiculopathy, lumbar region: Secondary | ICD-10-CM | POA: Diagnosis not present

## 2018-05-05 DIAGNOSIS — M545 Low back pain: Secondary | ICD-10-CM | POA: Diagnosis not present

## 2018-05-08 DIAGNOSIS — M545 Low back pain: Secondary | ICD-10-CM | POA: Diagnosis not present

## 2018-05-08 DIAGNOSIS — M5416 Radiculopathy, lumbar region: Secondary | ICD-10-CM | POA: Diagnosis not present

## 2018-05-19 ENCOUNTER — Other Ambulatory Visit: Payer: Self-pay | Admitting: Family Medicine

## 2018-05-19 ENCOUNTER — Other Ambulatory Visit: Payer: Self-pay

## 2018-05-19 ENCOUNTER — Encounter: Payer: Self-pay | Admitting: Family Medicine

## 2018-05-19 ENCOUNTER — Ambulatory Visit (INDEPENDENT_AMBULATORY_CARE_PROVIDER_SITE_OTHER): Payer: Medicare Other | Admitting: Family Medicine

## 2018-05-19 ENCOUNTER — Telehealth: Payer: Self-pay | Admitting: Physician Assistant

## 2018-05-19 VITALS — BP 132/80 | HR 80 | Temp 98.2°F | Wt 275.2 lb

## 2018-05-19 DIAGNOSIS — F17200 Nicotine dependence, unspecified, uncomplicated: Secondary | ICD-10-CM

## 2018-05-19 DIAGNOSIS — I10 Essential (primary) hypertension: Secondary | ICD-10-CM | POA: Diagnosis not present

## 2018-05-19 DIAGNOSIS — D509 Iron deficiency anemia, unspecified: Secondary | ICD-10-CM | POA: Diagnosis not present

## 2018-05-19 MED ORDER — BUPROPION HCL 75 MG PO TABS: 75.0000 mg | ORAL_TABLET | Freq: Two times a day (BID) | ORAL | 0 refills | Status: DC

## 2018-05-19 MED ORDER — NICOTINE 14 MG/24HR TD PT24: 14.0000 mg | MEDICATED_PATCH | Freq: Every day | TRANSDERMAL | 0 refills | Status: DC

## 2018-05-19 NOTE — Patient Instructions (Signed)
It was a pleasure to see you today! Thank you for choosing [facility] for your primary care. Kendra [name] was seen for followup on bloodwork. Come back to the clinic on 10/7 to see Dr. Owens Shark, and go to the emergency room if you have any life threatening symptoms.  Dr. Dot Lanes wants to see you on the 7th to discuss your thyroid.  I'm not sure who called you about the other blood work but I've drawn a CBC today just in case Dr. Owens Shark had also asked for that.  Your blood pressure was doing well.  Continue to see the cardiologist and your pain doctor as they see fit.   If we did any lab work today that did not result today, one of two things will happen.  1. If everything is normal, you will get a letter in mail sent to the address in your chart with the results for your records.  It is important to keep your address up to date as that is where we will send results.  2. If the results require some sort of discussion, my nurses or myself will call you on the phone number listed in your records.  It is important to keep your phone number up to date in our system as this is how we will try to reach you.  If we cannot reach you on the phone, we will try to send you a letter in the mail so please enable to voicemail function of your phone.  If you don't hear from [place] in two weeks, please give [place] a call to verify your results. Otherwise, we look forward to seeing you again at your next visit. If you have any questions or concerns before then, please call the clinic at [phone].    Please bring all your medications to every doctors visit   Sign up for My Chart to have easy access to your labs results, and communication with your Primary care physician.     Please check-out at the front desk before leaving the clinic.     Best,  Dr. Sherene Sires FAMILY MEDICINE RESIDENT - PGY2 05/19/2018 10:07 AM

## 2018-05-19 NOTE — Progress Notes (Signed)
    Subjective:  Kendra [name] is a 52 y.o. female who presents to the [facility] today with a chief complaint of "someone called me about my blood".   HPI: Patient says I called her and told her to come in for blood work.  It does not appear that I called her and after some investigation it seems she might have been called by Dr. Owens Shark.  We discussed that she has no current physical complaints that she wants to discuss.  In getting to know more about her health we discussed her smoking and she expressed a desire to quit.   We discussed some aids that could be prescribed and health benefits of cessation.  Objective:  Physical Exam: BP 132/80 (BP Location: Right Arm)   Pulse 80   Temp 98.2 F (36.8 C) (Oral)   Wt 275 lb 3.2 oz (124.8 kg)   LMP 02/21/2015 (Approximate)   SpO2 99%   BMI 47.24 kg/m   Gen: NAD, resting comfortably CV: RRR with no murmurs appreciated Pulm: NWOB, slightly course lung sounds diffusely GI: Normal bowel sounds present. Soft, Nontender, Nondistended. MSK: no edema, cyanosis, or clubbing noted Skin: warm, dry Neuro: grossly normal, moves all extremities Psych: Normal affect and thought content  Results for orders placed or performed in visit on 05/19/18 (from the past 72 hour(s))  CBC     Status: Abnormal   Collection Time: 05/19/18 10:09 AM  Result Value Ref Range   WBC 5.1 3.4 - 10.8 x10E3/uL   RBC 4.23 3.77 - 5.28 x10E6/uL   Hemoglobin 14.2 11.1 - 15.9 g/dL   Hematocrit 43.0 34.0 - 46.6 %   MCV 102 (H) 79 - 97 fL   MCH 33.6 (H) 26.6 - 33.0 pg   MCHC 33.0 31.5 - 35.7 g/dL   RDW 12.2 (L) 12.3 - 15.4 %   Platelets 244 150 - 450 x10E3/uL     Assessment/Plan:  Iron deficiency anemia Given hx of iron deficiency anemia, will recheck CBC  Results indicate macrocytosis but no significant anemia  TOBACCO ABUSE Patient decides to attempt cessation, ordered patches and wellbutrin  HTN (hypertension) 132/80 today, we discussed that improvements could be  made with more activity and smoking cessation.  Patient decides to attempt this  *Dr. Owens Shark now has scheduled appt 10/7 with patient as she earlier requested  Sherene Sires, [place] - PGY2 05/21/2018 7:32 AM

## 2018-05-19 NOTE — Telephone Encounter (Signed)
Spoke with patient who called to let us know that she is having continuous CP, SOB and vomiting since the am.  She has the medical alert necklace and I told her to activate it and go to the ED for immediate attention.  She verbalized understanding.

## 2018-05-19 NOTE — Telephone Encounter (Signed)
New Message:    Pt c/o of Chest Pain: STAT if CP now or developed within 24 hours  1. Are you having CP right now? Yes   2. Are you experiencing any other symptoms (ex. SOB, nausea, vomiting, sweating)? Vomiting  3. How long have you been experiencing CP? Month   4. Is your CP continuous or coming and going? Continuous  5. Have you taken Nitroglycerin? NO ?

## 2018-05-20 LAB — CBC
HEMATOCRIT: 43 % (ref 34.0–46.6)
Hemoglobin: 14.2 g/dL (ref 11.1–15.9)
MCH: 33.6 pg — ABNORMAL HIGH (ref 26.6–33.0)
MCHC: 33 g/dL (ref 31.5–35.7)
MCV: 102 fL — ABNORMAL HIGH (ref 79–97)
Platelets: 244 10*3/uL (ref 150–450)
RBC: 4.23 x10E6/uL (ref 3.77–5.28)
RDW: 12.2 % — AB (ref 12.3–15.4)
WBC: 5.1 10*3/uL (ref 3.4–10.8)

## 2018-05-21 ENCOUNTER — Other Ambulatory Visit: Payer: Self-pay

## 2018-05-21 MED ORDER — ATORVASTATIN CALCIUM 40 MG PO TABS: 40.0000 mg | ORAL_TABLET | Freq: Every day | ORAL | 3 refills | Status: DC

## 2018-05-21 NOTE — Assessment & Plan Note (Signed)
Given hx of iron deficiency anemia, will recheck CBC  Results indicate macrocytosis but no significant anemia

## 2018-05-21 NOTE — Assessment & Plan Note (Signed)
132/80 today, we discussed that improvements could be made with more activity and smoking cessation.  Patient decides to attempt this

## 2018-05-21 NOTE — Assessment & Plan Note (Signed)
Patient decides to attempt cessation, ordered patches and wellbutrin

## 2018-05-30 ENCOUNTER — Ambulatory Visit
Admission: RE | Admit: 2018-05-30 | Discharge: 2018-05-30 | Disposition: A | Discharge status: Home / Self Care | Payer: Medicare Other | Source: Ambulatory Visit | Attending: Obstetrics & Gynecology | Admitting: Obstetrics & Gynecology

## 2018-05-30 DIAGNOSIS — Z1231 Encounter for screening mammogram for malignant neoplasm of breast: Secondary | ICD-10-CM

## 2018-06-02 ENCOUNTER — Ambulatory Visit (INDEPENDENT_AMBULATORY_CARE_PROVIDER_SITE_OTHER): Payer: Medicare Other | Admitting: Family Medicine

## 2018-06-02 ENCOUNTER — Other Ambulatory Visit: Payer: Self-pay

## 2018-06-02 ENCOUNTER — Encounter: Payer: Self-pay | Admitting: Family Medicine

## 2018-06-02 VITALS — BP 132/64 | HR 75 | Temp 98.3°F | Ht 64.0 in | Wt 272.2 lb

## 2018-06-02 DIAGNOSIS — Z23 Encounter for immunization: Secondary | ICD-10-CM

## 2018-06-02 DIAGNOSIS — K219 Gastro-esophageal reflux disease without esophagitis: Secondary | ICD-10-CM | POA: Diagnosis not present

## 2018-06-02 DIAGNOSIS — D7589 Other specified diseases of blood and blood-forming organs: Secondary | ICD-10-CM

## 2018-06-02 DIAGNOSIS — Z720 Tobacco use: Secondary | ICD-10-CM

## 2018-06-02 MED ORDER — NICOTINE 21 MG/24HR TD PT24: 21.0000 mg | MEDICATED_PATCH | Freq: Every day | TRANSDERMAL | 0 refills | Status: DC

## 2018-06-02 MED ORDER — PANTOPRAZOLE SODIUM 40 MG PO TBEC: 40.0000 mg | DELAYED_RELEASE_TABLET | Freq: Every day | ORAL | 1 refills | Status: DC

## 2018-06-02 MED ORDER — NICOTINE 14 MG/24HR TD PT24: 14.0000 mg | MEDICATED_PATCH | Freq: Every day | TRANSDERMAL | 0 refills | Status: DC

## 2018-06-02 NOTE — Progress Notes (Signed)
Patient Name: Kendra [name] Date of Birth: 26-Aug-1966 Date of Visit: 06/02/18 PCP: Sherene Sires, DO  Chief Complaint: discuss blood work, tobacco use  Subjective: Kendra [name] is a pleasant 52 y.o. year old woman with a history of morbid obesity, tobacco abuse, essential hypertension, chronic chest pain of unknown etiology, alcohol abuse disorder presenting today for follow up for blood work.  Macrocytosis At the patient's last visit it was noted she had a significant macrocytosis.  She has a long-standing history of iron deficiency anemia related to normal uterine bleeding.  Her abnormal uterine bleeding has resolved.  She no longer takes iron.  She has not had any vaginal bleeding.  She reports she eats a varied diet including animal products such as eggs and meat.  She does not restrict any foods.  She has never had any sort of gastric bypass surgery.  She does consume a significant amount of alcohol she reports her mother is with her today.  Her mother reports she has always consumed quite a bit of alcohol.  The patient reports she drinks 2- 40s every single day.  For as long as she can remember she is not gone a single day without alcohol.  Alcohol use The patient reports she has been drinking for over 30 years.  At one time she consume a larger quantity of alcohol every single day. She does not desire to cut down or quit.   Tobacco Use The patient smokes between 1 to 2 packs of cigarettes per day.  She wakes up each morning and smokes a cigarette first thing.  She does desire to quit.  She was recently started on Wellbutrin by Dr. Criss Rosales for both anxiety and tobacco abuse.  She would like a refill on nicotine patches.  She has not yet set a quit date.  Chest Pain  The patient reports a several year history of intermittent chest pain.  This is unchanged.  Her chest pain comes on a few times a day.  This occurs at anytime of the day after eating or while walking.  She has had a negative  stress test and a normal CT of the chest evaluating for pulmonary embolism.  She has not had therapy for acid reflux as suggested by her cardiologist.  She is amenable to this today.  ROS:  ROS  I have reviewed the patient's medical, surgical, family, and social history as appropriate.   Vitals:   06/02/18 1010  BP: 132/64  Pulse: 75  Temp: 98.3 F (36.8 C)  SpO2: 95%   Filed Weights   06/02/18 1010  Weight: 272 lb 3.2 oz (123.5 kg)    HEENT: Sclera anicteric. Dentition is poor. Appears well hydrated. Neck: Supple Cardiac: Regular rate and rhythm. Normal S1/S2. No murmurs, rubs, or gallops appreciated. Lungs: Poor effort, diminished breath sounds bilaterally no crackles. Extremities: Warm, well perfused without edema.  Skin: Warm, dry Psych: Pleasant and appropriate    Shima was seen today for follow-up.  Diagnoses and all orders for this visit:  Macrocytosis, likely due to alcohol use disorder. Discussed cutting down/quitting. Discussed long term health consequences at length. -     CBC -     Vitamin B12 -     Folate -     TSH -     Hepatitis c antibody (reflex) -     Methylmalonic Acid, Serum  Tobacco abuse, discussed long term health consequences.  -     nicotine (NICODERM CQ) 21 mg/24hr patch;  Place 1 patch (21 mg total) onto the skin daily. -     nicotine (NICODERM CQ) 14 mg/24hr patch; Place 1 patch (14 mg total) onto the skin daily.  Need for immunization against influenza -     Flu Vaccine QUAD 36+ mos IM  Atypical Chest Pain, possibly related to GERD, chronic problem, unchanged from prior, refill only, reviewed reasons to call and return to care.  -     pantoprazole (PROTONIX) 40 MG tablet; Take 1 tablet (40 mg total) by mouth daily.  Dorris Singh, MD  Family Medicine Teaching Service

## 2018-06-02 NOTE — Patient Instructions (Signed)
Smoking cessation: - Try reading, writing in the morning, enjoying breakfast inside - In car- put your cigarettes in the back - Place 20 rubber bands around your pack - Place your lighter in your car and your pack of cigarettes in the house- separation helps keep you away from them - Start with the 21 mg patch- use this for 1 week- replaced every 24 hours - then after 1 week, got 14 mg patch - the patch can make you nauseous if you smoke a lot with them - SET A QUIT DATE   It was wonderful to see you today.  Thank you for choosing [facility].   Please call [phone] with any questions about today's appointment.  Please be sure to schedule follow up at the front  desk before you leave today.   Dorris Singh, MD  Family Medicine

## 2018-06-04 LAB — CBC
Hematocrit: 41.5 % (ref 34.0–46.6)
Hemoglobin: 14.3 g/dL (ref 11.1–15.9)
MCH: 33.6 pg — ABNORMAL HIGH (ref 26.6–33.0)
MCHC: 34.5 g/dL (ref 31.5–35.7)
MCV: 98 fL — ABNORMAL HIGH (ref 79–97)
PLATELETS: 246 10*3/uL (ref 150–450)
RBC: 4.25 x10E6/uL (ref 3.77–5.28)
RDW: 12.2 % — AB (ref 12.3–15.4)
WBC: 5.3 10*3/uL (ref 3.4–10.8)

## 2018-06-04 LAB — HCV COMMENT:

## 2018-06-04 LAB — METHYLMALONIC ACID, SERUM: METHYLMALONIC ACID: 285 nmol/L (ref 0–378)

## 2018-06-04 LAB — TSH: TSH: 1.06 u[IU]/mL (ref 0.450–4.500)

## 2018-06-04 LAB — FOLATE: FOLATE: 8.6 ng/mL (ref >3.0)

## 2018-06-04 LAB — HEPATITIS C ANTIBODY (REFLEX): HCV Ab: <0.1 s/co ratio (ref 0.0–0.9)

## 2018-06-04 LAB — VITAMIN B12: Vitamin B-12: 289 pg/mL (ref 232–1245)

## 2018-06-09 ENCOUNTER — Telehealth: Payer: Self-pay | Admitting: Family Medicine

## 2018-06-09 NOTE — Telephone Encounter (Signed)
Mother is calling because she said that her daughter is loosing weight and does not have any strength. She wants to know what is wrong with her daughter. I told her that the doctor would not be able to give that information to her until her daughter gives the okay. She still wanted to talk to the doctor anyway. jw

## 2018-06-11 ENCOUNTER — Ambulatory Visit (INDEPENDENT_AMBULATORY_CARE_PROVIDER_SITE_OTHER): Payer: Medicare Other

## 2018-06-11 ENCOUNTER — Ambulatory Visit (INDEPENDENT_AMBULATORY_CARE_PROVIDER_SITE_OTHER): Payer: Medicare Other | Admitting: Podiatry

## 2018-06-11 DIAGNOSIS — M19072 Primary osteoarthritis, left ankle and foot: Secondary | ICD-10-CM

## 2018-06-11 DIAGNOSIS — L989 Disorder of the skin and subcutaneous tissue, unspecified: Secondary | ICD-10-CM

## 2018-06-11 DIAGNOSIS — M659 Synovitis and tenosynovitis, unspecified: Secondary | ICD-10-CM | POA: Diagnosis not present

## 2018-06-11 MED ORDER — MELOXICAM 15 MG PO TABS: 15.0000 mg | ORAL_TABLET | Freq: Every day | ORAL | 1 refills | Status: AC

## 2018-06-16 NOTE — Telephone Encounter (Signed)
In reviewing chart there is a Release of Information form scanned in pt chart with date of 03/05/2016 giving all [facility] practices permission to talk to pt mother. Routing to PCP as an Pharmacist, hospital.  Katharina Caper, April D, [place]

## 2018-06-17 NOTE — Progress Notes (Signed)
Cardiology Office Note    Date:  06/18/2018   ID:  ANEA FODERA, DOB 1966-01-24, MRN [id number]  PCP:  Sherene Sires, DO  Cardiologist: No primary care provider on file.  Dr. Rayann Heman was DOD the day she was seen EPS: None  Chief Complaint  Patient presents with  . Chest Pain    History of Present Illness:  Kendra [name] is a 52 y.o. female with history of chest pain 11/2017 CT negative for PE, troponins negative, did show evidence of CAD.  Started on aspirin.  I saw the patient 12/2017 and follow-up she was smoking a pack of cigarettes daily.  Complaining of atypical chest pain.  Hyperlipidemia treated.  Stress Myoview 01/29/2018 low risk study small defect of mild severity mid anterior septal and apical septal location consistent with breast attenuation.  No ischemia LVEF 55 to 65% she was asked to follow-up with GI.  Patient never followed up with GI.  Recently saw her PCP with increased atypical chest pain and started on Protonix.Complains of sharp pain in upper chest off/on all day. Acid reflux is terrible. Protonix has helped some.  Takes 2 aspirin when she has it and it helps a little.  Does not eat much throughout the day but only at night.  Smoking 2 ppd. Drinks 2-40 ounces beer daily.    Past Medical History:  Diagnosis Date  . Anemia   . Asthma   . Esophagitis   . GERD (gastroesophageal reflux disease)   . HTN (hypertension)     Past Surgical History:  Procedure Laterality Date  . ABDOMINAL HYSTERECTOMY  04/12/15  . ANKLE SURGERY Left   . CESAREAN SECTION    . CYSTOSCOPY N/A 04/12/2015   Procedure: CYSTOSCOPY;  Surgeon: Lavonia Drafts, MD;  Location: [facility];  Service: Gynecology;  Laterality: N/A;  . FINGER SURGERY    . TIBIA FRACTURE SURGERY    . TUBAL LIGATION      Current Medications: Current Meds  Medication Sig  . acetaminophen (TYLENOL 8 HOUR) 650 MG CR tablet Take 1 tablet (650 mg total) by mouth every 8 (eight) hours as needed for pain.  Marland Kitchen  albuterol (PROAIR HFA) 108 (90 Base) MCG/ACT inhaler inhale 2 puffs every 4 hours if needed wheezing or shortness of breath  . alum & mag hydroxide-simeth (MAALOX/MYLANTA) 200-200-20 MG/5ML suspension Take 30 mLs by mouth every 6 (six) hours as needed for indigestion or heartburn. Reported on 10/26/2015  . atorvastatin (LIPITOR) 40 MG tablet Take 1 tablet (40 mg total) by mouth daily.  Marland Kitchen buPROPion (WELLBUTRIN) 75 MG tablet Take 1 tablet (75 mg total) by mouth 2 (two) times daily.  Marland Kitchen docusate sodium (COLACE) 100 MG capsule Take 1 capsule (100 mg total) by mouth 2 (two) times daily. (Patient taking differently: Take 100 mg by mouth 2 (two) times daily as needed for mild constipation. )  . furosemide (LASIX) 20 MG tablet take 1 tablet by mouth once daily  . gabapentin (NEURONTIN) 300 MG capsule Take 1 capsule (300 mg total) by mouth at bedtime.  Marland Kitchen lisinopril (PRINIVIL,ZESTRIL) 10 MG tablet Take 10 mg by mouth daily.  . meloxicam (MOBIC) 15 MG tablet Take 1 tablet (15 mg total) by mouth daily.  . pantoprazole (PROTONIX) 40 MG tablet Take 1 tablet (40 mg total) by mouth daily.  . [DISCONTINUED] aspirin EC 81 MG tablet Take 1 tablet (81 mg total) by mouth daily.  . [DISCONTINUED] nicotine (NICODERM CQ) 14 mg/24hr patch Place 1 patch (14  mg total) onto the skin daily.  . [DISCONTINUED] nicotine (NICODERM CQ) 21 mg/24hr patch Place 1 patch (21 mg total) onto the skin daily.     Allergies:   Lisinopril   Social History   Socioeconomic History  . Marital status: Single    Spouse name: Not on file  . Number of children: 3  . Years of education: 55  . Highest education level: Not on file  Occupational History  . Occupation: disabled- Research officer, political party Needs  . Financial resource strain: Not on file  . Food insecurity:    Worry: Not on file    Inability: Not on file  . Transportation needs:    Medical: Not on file    Non-medical: Not on file  Tobacco Use  . Smoking status: Current Every Day Smoker      Packs/day: 1.50    Types: Cigarettes    Start date: 08/27/2002  . Smokeless tobacco: Never Used  . Tobacco comment: cut back to 4-5 cigs per day from 1ppd  Substance and Sexual Activity  . Alcohol use: Yes    Alcohol/week: 0.0 standard drinks    Comment: 2 -40oz daily,  24oz daily.    . Drug use: No  . Sexual activity: Not Currently    Birth control/protection: None  Lifestyle  . Physical activity:    Days per week: Not on file    Minutes per session: Not on file  . Stress: Not on file  Relationships  . Social connections:    Talks on phone: Not on file    Gets together: Not on file    Attends religious service: Not on file    Active member of club or organization: Not on file    Attends meetings of clubs or organizations: Not on file    Relationship status: Not on file  Other Topics Concern  . Not on file  Social History Narrative   Health Care POA:    Emergency Contact: mother, Melany Guernsey (h) [phone]   End of Life Plan:    Who lives with you: self   Any pets: none   Diet: Pt reports not eating very much.  Drinks juice and alcohol most days.    Exercise: Pt has not regular exercise routine.   Seatbelts: Pt reports wearing seatbelt when in vehicles.    Sun Exposure/Protection:    Hobbies: watching TV   Lives with son in a one story home.  Has 3 children.  On disability for arthritis.  Education: high school.    She last worked in 2013 at [employer].          Family History:  The patient's family history includes Diabetes in her mother and sister; Heart disease in her mother; Lung cancer (age of onset: 29) in her father.   ROS:   Please see the history of present illness.    Review of Systems  Cardiovascular: Positive for chest pain.  Musculoskeletal: Positive for back pain.  Gastrointestinal: Positive for abdominal pain and heartburn.  Neurological: Positive for headaches.   All other systems reviewed and are negative.   PHYSICAL EXAM:   VS:  BP  128/80 (BP Location: Right Arm, Patient Position: Sitting, Cuff Size: Normal)   Pulse 87   Ht 5\' 4"  (1.626 m)   Wt 275 lb 6.4 oz (124.9 kg)   LMP 02/21/2015 (Approximate)   SpO2 100% Comment: at rest  BMI 47.27 kg/m   Physical Exam  GEN: Obese, in no  acute distress  Neck: no JVD, carotid bruits, or masses Cardiac: Distant heart sounds RRR; no murmurs, rubs, or gallops  Respiratory:  clear to auscultation bilaterally, normal work of breathing GI: soft, nontender, nondistended, + BS Ext: without cyanosis, clubbing, or edema, Good distal pulses bilaterally Neuro:  Alert and Oriented x 3 Psych: euthymic mood, full affect  Wt Readings from Last 3 Encounters:  06/18/18 275 lb 6.4 oz (124.9 kg)  06/02/18 272 lb 3.2 oz (123.5 kg)  05/19/18 275 lb 3.2 oz (124.8 kg)      Studies/Labs Reviewed:   EKG:  EKG is not ordered today.  Recent Labs: 12/14/2017: BUN 9; Creatinine, Ser 0.87; Potassium 4.0; Sodium 138 06/02/2018: Hemoglobin 14.3; Platelets 246; TSH 1.060   Lipid Panel    Component Value Date/Time   CHOL 175 02/18/2017 1418   TRIG 239 (H) 02/18/2017 1418   HDL 49 02/18/2017 1418   CHOLHDL 3.6 02/18/2017 1418   CHOLHDL 3.0 02/22/2016 1538   VLDL 41 (H) 02/22/2016 1538   LDLCALC 78 02/18/2017 1418    Additional studies/ records that were reviewed today include:  Nuclear stress test 01/29/2018 study Highlights    Nuclear stress EF: 60%.  There is a small defect of mild severity present in the mid anteroseptal and apical septal location. The defect is non-reversible and consistent with breast attenuation artifact. No ischemia noted  This is a low risk study.  The left ventricular ejection fraction is normal (55-65%).  There was no ST segment deviation noted during stress.         ASSESSMENT:    1. Chest pain, unspecified type   2. Morbid obesity ([place])   3. TOBACCO ABUSE   4. Hyperlipidemia, unspecified hyperlipidemia type      PLAN:  In order of problems  listed above:  Atypical chest pain described as sharp shooting off and on associated with acid reflux.  Chest CT 11/2017 scattered coronary calcification no PE.  Exercise Myoview negative for ischemia low risk study EF 60% 01/29/2018.  Told her to stop taking aspirin on an empty stomach and follow-up with GI.  Have placed referral.  Does not need to follow-up with cardiology.  Follow-up with PCP and GI.  Morbid obesity weight loss recommended.  Patient has been fasting trying to lose weight.  Tobacco abuse smokes 2 packs of cigarettes daily.  Smoking cessation discussed.  Hyperlipidemia treated  Medication Adjustments/Labs and Tests Ordered: Current medicines are reviewed at length with the patient today.  Concerns regarding medicines are outlined above.  Medication changes, Labs and Tests ordered today are listed in the Patient Instructions below. Patient Instructions  Medication Instructions:  Your physician has recommended you make the following change in your medication:   STOP: Aspirin   If you need a refill on your cardiac medications before your next appointment, please call your pharmacy.   Lab work: None ordered If you have labs (blood work) drawn today and your tests are completely normal, you will receive your results only by: Marland Kitchen MyChart Message (if you have MyChart) OR . A paper copy in the mail If you have any lab test that is abnormal or we need to change your treatment, we will call you to review the results.  Testing/Procedures: None ordered  Follow-Up: You have been referred to Gastroenterology   . Follow up with HeartCare AS NEEDED  Any Other Special Instructions Will Be Listed Below (If Applicable).  DECREASE your alcohol intake  Steps to Quit Smoking Smoking tobacco  can be bad for your health. It can also affect almost every organ in your body. Smoking puts you and people around you at risk for many serious long-lasting (chronic) diseases. Quitting smoking is  hard, but it is one of the best things that you can do for your health. It is never too late to quit. What are the benefits of quitting smoking? When you quit smoking, you lower your risk for getting serious diseases and conditions. They can include:  Lung cancer or lung disease.  Heart disease.  Stroke.  Heart attack.  Not being able to have children (infertility).  Weak bones (osteoporosis) and broken bones (fractures).  If you have coughing, wheezing, and shortness of breath, those symptoms may get better when you quit. You may also get sick less often. If you are pregnant, quitting smoking can help to lower your chances of having a baby of low birth weight. What can I do to help me quit smoking? Talk with your doctor about what can help you quit smoking. Some things you can do (strategies) include:  Quitting smoking totally, instead of slowly cutting back how much you smoke over a period of time.  Going to in-person counseling. You are more likely to quit if you go to many counseling sessions.  Using resources and support systems, such as: ? Database administrator with a Social worker. ? Phone quitlines. ? Careers information officer. ? Support groups or group counseling. ? Text messaging programs. ? Mobile phone apps or applications.  Taking medicines. Some of these medicines may have nicotine in them. If you are pregnant or breastfeeding, do not take any medicines to quit smoking unless your doctor says it is okay. Talk with your doctor about counseling or other things that can help you.  Talk with your doctor about using more than one strategy at the same time, such as taking medicines while you are also going to in-person counseling. This can help make quitting easier. What things can I do to make it easier to quit? Quitting smoking might feel very hard at first, but there is a lot that you can do to make it easier. Take these steps:  Talk to your family and friends. Ask them to  support and encourage you.  Call phone quitlines, reach out to support groups, or work with a Social worker.  Ask people who smoke to not smoke around you.  Avoid places that make you want (trigger) to smoke, such as: ? Bars. ? Parties. ? Smoke-break areas at work.  Spend time with people who do not smoke.  Lower the stress in your life. Stress can make you want to smoke. Try these things to help your stress: ? Getting regular exercise. ? Deep-breathing exercises. ? Yoga. ? Meditating. ? Doing a body scan. To do this, close your eyes, focus on one area of your body at a time from head to toe, and notice which parts of your body are tense. Try to relax the muscles in those areas.  Download or buy apps on your mobile phone or tablet that can help you stick to your quit plan. There are many free apps, such as QuitGuide from the [employer] Office manager for Disease Control and Prevention). You can find more support from smokefree.gov and other websites.  This information is not intended to replace advice given to you by your health care provider. Make sure you discuss any questions you have with your health care provider. Document Released: 06/09/2009 Document Revised: 04/10/2016 Document Reviewed: 12/28/2014  Elsevier Interactive Patient Education  2018 [place], Ermalinda Barrios, [place]  06/18/2018 10:07 AM    [facility] [name], [place], [place]  [address] Phone: [phone]; Fax: [phone]

## 2018-06-18 ENCOUNTER — Ambulatory Visit (INDEPENDENT_AMBULATORY_CARE_PROVIDER_SITE_OTHER): Payer: Medicare Other | Admitting: Physician Assistant

## 2018-06-18 ENCOUNTER — Encounter: Payer: Self-pay | Admitting: Physician Assistant

## 2018-06-18 VITALS — BP 128/80 | HR 87 | Ht 64.0 in | Wt 275.4 lb

## 2018-06-18 DIAGNOSIS — E785 Hyperlipidemia, unspecified: Secondary | ICD-10-CM | POA: Diagnosis not present

## 2018-06-18 DIAGNOSIS — F17200 Nicotine dependence, unspecified, uncomplicated: Secondary | ICD-10-CM

## 2018-06-18 DIAGNOSIS — R079 Chest pain, unspecified: Secondary | ICD-10-CM | POA: Diagnosis not present

## 2018-06-18 NOTE — Patient Instructions (Signed)
Medication Instructions:  Your physician has recommended you make the following change in your medication:   STOP: Aspirin   If you need a refill on your cardiac medications before your next appointment, please call your pharmacy.   Lab work: None ordered If you have labs (blood work) drawn today and your tests are completely normal, you will receive your results only by: Marland Kitchen MyChart Message (if you have MyChart) OR . A paper copy in the mail If you have any lab test that is abnormal or we need to change your treatment, we will call you to review the results.  Testing/Procedures: None ordered  Follow-Up: You have been referred to Gastroenterology   . Follow up with HeartCare AS NEEDED  Any Other Special Instructions Will Be Listed Below (If Applicable).  DECREASE your alcohol intake  Steps to Quit Smoking Smoking tobacco can be bad for your health. It can also affect almost every organ in your body. Smoking puts you and people around you at risk for many serious long-lasting (chronic) diseases. Quitting smoking is hard, but it is one of the best things that you can do for your health. It is never too late to quit. What are the benefits of quitting smoking? When you quit smoking, you lower your risk for getting serious diseases and conditions. They can include:  Lung cancer or lung disease.  Heart disease.  Stroke.  Heart attack.  Not being able to have children (infertility).  Weak bones (osteoporosis) and broken bones (fractures).  If you have coughing, wheezing, and shortness of breath, those symptoms may get better when you quit. You may also get sick less often. If you are pregnant, quitting smoking can help to lower your chances of having a baby of low birth weight. What can I do to help me quit smoking? Talk with your doctor about what can help you quit smoking. Some things you can do (strategies) include:  Quitting smoking totally, instead of slowly cutting back  how much you smoke over a period of time.  Going to in-person counseling. You are more likely to quit if you go to many counseling sessions.  Using resources and support systems, such as: ? Database administrator with a Social worker. ? Phone quitlines. ? Careers information officer. ? Support groups or group counseling. ? Text messaging programs. ? Mobile phone apps or applications.  Taking medicines. Some of these medicines may have nicotine in them. If you are pregnant or breastfeeding, do not take any medicines to quit smoking unless your doctor says it is okay. Talk with your doctor about counseling or other things that can help you.  Talk with your doctor about using more than one strategy at the same time, such as taking medicines while you are also going to in-person counseling. This can help make quitting easier. What things can I do to make it easier to quit? Quitting smoking might feel very hard at first, but there is a lot that you can do to make it easier. Take these steps:  Talk to your family and friends. Ask them to support and encourage you.  Call phone quitlines, reach out to support groups, or work with a Social worker.  Ask people who smoke to not smoke around you.  Avoid places that make you want (trigger) to smoke, such as: ? Bars. ? Parties. ? Smoke-break areas at work.  Spend time with people who do not smoke.  Lower the stress in your life. Stress can make you want to  smoke. Try these things to help your stress: ? Getting regular exercise. ? Deep-breathing exercises. ? Yoga. ? Meditating. ? Doing a body scan. To do this, close your eyes, focus on one area of your body at a time from head to toe, and notice which parts of your body are tense. Try to relax the muscles in those areas.  Download or buy apps on your mobile phone or tablet that can help you stick to your quit plan. There are many free apps, such as QuitGuide from the [employer] Office manager for Disease Control and  Prevention). You can find more support from smokefree.gov and other websites.  This information is not intended to replace advice given to you by your health care provider. Make sure you discuss any questions you have with your health care provider. Document Released: 06/09/2009 Document Revised: 04/10/2016 Document Reviewed: 12/28/2014 Elsevier Interactive Patient Education  2018 [employer].

## 2018-06-19 ENCOUNTER — Other Ambulatory Visit: Payer: Self-pay

## 2018-06-19 ENCOUNTER — Other Ambulatory Visit: Payer: Self-pay | Admitting: Family Medicine

## 2018-06-19 DIAGNOSIS — F17200 Nicotine dependence, unspecified, uncomplicated: Secondary | ICD-10-CM

## 2018-06-19 MED ORDER — LISINOPRIL 10 MG PO TABS: 10.0000 mg | ORAL_TABLET | Freq: Every day | ORAL | 0 refills | Status: DC

## 2018-06-19 NOTE — Progress Notes (Signed)
   Subjective: 52 year old female presenting today with a chief complaint of painful callus lesions on bilateral feet that have been present for the past few years. Ambulation and wearing shoes increases the pain. He has not done anything for treatment. Patient is here for further evaluation and treatment.   Past Medical History:  Diagnosis Date  . Anemia   . Asthma   . Esophagitis   . GERD (gastroesophageal reflux disease)   . HTN (hypertension)      Objective:  Physical Exam General: Alert and oriented x3 in no acute distress  Dermatology: Hyperkeratotic lesions present on the bilateral feet x 3. Pain on palpation with a central nucleated core noted. Skin is warm, dry and supple bilateral lower extremities. Negative for open lesions or macerations.  Vascular: Palpable pedal pulses bilaterally. No edema or erythema noted. Capillary refill within normal limits.  Neurological: Epicritic and protective threshold grossly intact bilaterally.   Musculoskeletal Exam: Pain on palpation at the keratotic lesions noted as well as to the anterior, medial and lateral aspects of the left ankle joint. Range of motion within normal limits bilateral. Muscle strength 5/5 in all groups bilateral.  Radiographic Exam: Degenerative changes with joint space narrowing noted to the left ankle.   Assessment: 1. Porokeratosis bilateral feet x 3 2. DJD left ankle   Plan of Care:  1. Patient evaluated. X-Rays reviewed.  2. Excisional debridement of keratoic lesion using a chisel blade was performed without incident.  3. Salinocaine applied to areas and light dressing applied. 4. Patient is to return to the clinic PRN.   Edrick Kins, DPM [facility]  Dr. Edrick Kins, [address]                                        [place], [place] [address]                Office [phone]  Fax [phone]

## 2018-06-25 ENCOUNTER — Other Ambulatory Visit: Payer: Self-pay | Admitting: Sports Medicine

## 2018-06-26 NOTE — Telephone Encounter (Signed)
Called patient and they did not pick up.  Left a message that if they are not feeling well they should come into the clinic and be seen by a doctor.

## 2018-06-30 DIAGNOSIS — H524 Presbyopia: Secondary | ICD-10-CM | POA: Diagnosis not present

## 2018-07-04 ENCOUNTER — Ambulatory Visit: Payer: Medicare Other | Admitting: Neurology

## 2018-07-18 ENCOUNTER — Other Ambulatory Visit: Payer: Self-pay | Admitting: Family Medicine

## 2018-07-18 DIAGNOSIS — F17200 Nicotine dependence, unspecified, uncomplicated: Secondary | ICD-10-CM

## 2018-07-22 ENCOUNTER — Encounter: Payer: Self-pay | Admitting: *Deleted

## 2018-08-01 ENCOUNTER — Ambulatory Visit (INDEPENDENT_AMBULATORY_CARE_PROVIDER_SITE_OTHER): Payer: Medicare Other | Admitting: Internal Medicine

## 2018-08-01 ENCOUNTER — Encounter: Payer: Self-pay | Admitting: Internal Medicine

## 2018-08-01 DIAGNOSIS — K219 Gastro-esophageal reflux disease without esophagitis: Secondary | ICD-10-CM | POA: Diagnosis not present

## 2018-08-01 MED ORDER — PANTOPRAZOLE SODIUM 40 MG PO TBEC: 40.0000 mg | DELAYED_RELEASE_TABLET | Freq: Two times a day (BID) | ORAL | 0 refills | Status: DC

## 2018-08-01 NOTE — Patient Instructions (Addendum)
Please follow GERD diet as instructed (we have given this to you today).  We have sent the following medications to your pharmacy for you to pick up at your convenience: Pantoprazole 40 mg twice daily before meals.  Call our office in 2 weeks with an update on how you are feeling.  If you are age 51 or older, your body mass index should be between 23-30. Your Body mass index is 46.69 kg/m. If this is out of the aforementioned range listed, please consider follow up with your Primary Care Provider.  If you are age 42 or younger, your body mass index should be between 19-25. Your Body mass index is 46.69 kg/m. If this is out of the aformentioned range listed, please consider follow up with your Primary Care Provider.

## 2018-08-01 NOTE — Progress Notes (Signed)
Patient ID: [name], female   DOB: November 17, 1965, 52 y.o.   MRN: [id number] HPI: Kendra [name] is a 52 year old female is a PMH of GERD, HTN, asthma who is seen in follow-up to discuss reflux.  She is here today with her mother and grandson.  She is known to [place] from screening colonoscopy which was performed on 10/26/2015.  This revealed a 4 mm rectosigmoid polyp found to be hyperplastic.  She had an upper endoscopy performed to evaluate epigastric pain and history of H. pylori.  This revealed a normal esophagus, normal stomach and mild duodenitis.  2 soft subepithelial lesions were seen in the second part of the duodenum which were biopsied.  The duodenal nodules were hyperplastic Brunner's glands.  The gastric biopsy showed chronic gastritis without H. Pylori.  She is maintained on pantoprazole 40 mg daily.  She reports that her heartburn is worsening and is is particular bad at night and when she is lying down.  She has some nausea but no vomiting.  No dysphagia or odynophagia.  Sodas make the heartburn worse.  No abdominal pain.  No change in bowel habits.  No blood in her stool or melena.  Past Medical History:  Diagnosis Date  . Anemia   . Asthma   . Esophagitis   . GERD (gastroesophageal reflux disease)   . HTN (hypertension)   . Hyperplastic colon polyp     Past Surgical History:  Procedure Laterality Date  . ABDOMINAL HYSTERECTOMY  04/12/15  . ANKLE SURGERY Left   . CESAREAN SECTION    . CYSTOSCOPY N/A 04/12/2015   Procedure: CYSTOSCOPY;  Surgeon: Kendra Drafts, MD;  Location: [facility];  Service: Gynecology;  Laterality: N/A;  . FINGER SURGERY    . TIBIA FRACTURE SURGERY    . TUBAL LIGATION      Outpatient Medications Prior to Visit  Medication Sig Dispense Refill  . acetaminophen (TYLENOL 8 HOUR) 650 MG CR tablet Take 1 tablet (650 mg total) by mouth every 8 (eight) hours as needed for pain. 30 tablet 1  . albuterol (PROAIR HFA) 108 (90 Base) MCG/ACT inhaler inhale 2  puffs every 4 hours if needed wheezing or shortness of breath 8.5 g 3  . alum & mag hydroxide-simeth (MAALOX/MYLANTA) 200-200-20 MG/5ML suspension Take 30 mLs by mouth every 6 (six) hours as needed for indigestion or heartburn. Reported on 10/26/2015    . atorvastatin (LIPITOR) 40 MG tablet Take 1 tablet (40 mg total) by mouth daily. 90 tablet 3  . buPROPion (WELLBUTRIN) 75 MG tablet TAKE 1 TABLET(75 MG) BY MOUTH TWICE DAILY 60 tablet 0  . docusate sodium (COLACE) 100 MG capsule Take 1 capsule (100 mg total) by mouth 2 (two) times daily. (Patient taking differently: Take 100 mg by mouth 2 (two) times daily as needed for mild constipation. ) 10 capsule 0  . furosemide (LASIX) 20 MG tablet take 1 tablet by mouth once daily 90 tablet 1  . gabapentin (NEURONTIN) 300 MG capsule Take 1 capsule (300 mg total) by mouth at bedtime. 30 capsule 5  . lisinopril (PRINIVIL,ZESTRIL) 10 MG tablet Take 1 tablet (10 mg total) by mouth daily. 90 tablet 0  . traMADol (ULTRAM) 50 MG tablet TAKE 1 TABLET BY MOUTH EVERY 12 HOURS IF NEEDED 60 tablet 0  . pantoprazole (PROTONIX) 40 MG tablet Take 1 tablet (40 mg total) by mouth daily. 90 tablet 1   No facility-administered medications prior to visit.     Allergies  Allergen Reactions  .  Lisinopril Swelling    Family History  Problem Relation Age of Onset  . Diabetes Mother   . Heart disease Mother   . Lung cancer Father 34  . Diabetes Sister   . Breast cancer Neg Hx   . Stomach cancer Neg Hx   . Colon cancer Neg Hx   . Pancreatic cancer Neg Hx     Social History   Tobacco Use  . Smoking status: Current Every Day Smoker    Packs/day: 1.50    Types: Cigarettes    Start date: 08/27/2002  . Smokeless tobacco: Never Used  . Tobacco comment: cut back to 4-5 cigs per day from 1ppd  Substance Use Topics  . Alcohol use: Yes    Alcohol/week: 0.0 standard drinks    Comment: 2 -40oz daily,  24oz daily.    . Drug use: No    ROS: As per history of present  illness, otherwise negative  BP 122/84   Pulse 96   Ht 5\' 4"  (1.626 m)   Wt 272 lb (123.4 kg)   LMP 02/21/2015 (Approximate)   BMI 46.69 kg/m  Gen: awake, alert, NAD HEENT: anicteric, op clear CV: RRR, no mrg Pulm: CTA b/l Abd: soft, obese, NT/ND, +BS throughout Ext: no c/c/e Neuro: nonfocal   RELEVANT LABS AND IMAGING: CBC    Component Value Date/Time   WBC 5.3 06/02/2018 1043   WBC 8.2 12/14/2017 0920   RBC 4.25 06/02/2018 1043   RBC 4.38 12/14/2017 0920   HGB 14.3 06/02/2018 1043   HCT 41.5 06/02/2018 1043   PLT 246 06/02/2018 1043   MCV 98 (H) 06/02/2018 1043   MCH 33.6 (H) 06/02/2018 1043   MCH 34.9 (H) 12/14/2017 0920   MCHC 34.5 06/02/2018 1043   MCHC 33.6 12/14/2017 0920   RDW 12.2 (L) 06/02/2018 1043   LYMPHSABS 1.4 03/09/2014 1105   MONOABS 0.5 03/09/2014 1105   EOSABS 0.0 03/09/2014 1105   BASOSABS 0.1 03/09/2014 1105    CMP     Component Value Date/Time   NA 138 12/14/2017 0920   NA 140 02/18/2017 1418   K 4.0 12/14/2017 0920   CL 108 12/14/2017 0920   CO2 21 (L) 12/14/2017 0920   GLUCOSE 105 (H) 12/14/2017 0920   BUN 9 12/14/2017 0920   BUN 9 02/18/2017 1418   CREATININE 0.87 12/14/2017 0920   CREATININE 0.76 02/22/2016 1538   CALCIUM 10.2 12/14/2017 0920   PROT 6.9 02/22/2016 1538   ALBUMIN 4.2 02/22/2016 1538   AST 20 02/22/2016 1538   ALT 20 02/22/2016 1538   ALKPHOS 75 02/22/2016 1538   BILITOT 0.5 02/22/2016 1538   GFRNONAA >60 12/14/2017 0920   GFRNONAA >89 02/22/2016 1538   GFRAA >60 12/14/2017 0920   GFRAA >89 02/22/2016 1538    ASSESSMENT/PLAN: 52 year old female is a PMH of GERD, HTN, asthma who is seen in follow-up to discuss reflux.   1. GERD --symptoms exacerbated by diet.  Will increase pantoprazole to 40 mg twice daily AC.  She is asked to notify me in 2 to 3 weeks if symptoms not better.  Could consider changing to [place] or adding nightly H2 blocker.  We discussed the importance of GERD diet, avoiding eating and  drinking within 2 hours of lying down.  Also weight loss would likely help her heartburn and GERD symptoms.  15 minutes spent with the patient today. Greater than 50% was spent in counseling and coordination of care with the patient   QB:HALPF, [place],  [address] [place], [place] [address]

## 2018-09-03 ENCOUNTER — Other Ambulatory Visit: Payer: Self-pay | Admitting: Family Medicine

## 2018-09-03 DIAGNOSIS — F17200 Nicotine dependence, unspecified, uncomplicated: Secondary | ICD-10-CM

## 2018-09-09 ENCOUNTER — Ambulatory Visit (INDEPENDENT_AMBULATORY_CARE_PROVIDER_SITE_OTHER): Payer: Medicare Other | Admitting: Sports Medicine

## 2018-09-09 VITALS — BP 116/74 | Ht 64.0 in | Wt 274.0 lb

## 2018-09-09 DIAGNOSIS — M1712 Unilateral primary osteoarthritis, left knee: Secondary | ICD-10-CM | POA: Diagnosis not present

## 2018-09-09 MED ORDER — METHYLPREDNISOLONE ACETATE 40 MG/ML IJ SUSP
40.0000 mg | Freq: Once | INTRAMUSCULAR | Status: AC
  Administered 2018-09-09: 40 mg via INTRA_ARTICULAR

## 2018-09-09 MED ORDER — TRAMADOL HCL 50 MG PO TABS: ORAL_TABLET | ORAL | 0 refills | Status: DC

## 2018-09-10 ENCOUNTER — Encounter: Payer: Self-pay | Admitting: Sports Medicine

## 2018-09-10 NOTE — Progress Notes (Signed)
   Subjective:    Patient ID: Kendra [name], female    DOB: [birth year], 53 y.o.   MRN: [id number]  HPI chief complaint: Bilateral knee pain  Patient comes in today complaining of returning bilateral knee pain.  Left knee pain is worse than the right.  She has a well-documented history of knee DJD.  She is requesting a cortisone injection and refill on her tramadol.  No recent trauma.  Interim medical history reviewed Medications reviewed Allergies reviewed    Review of Systems   As above Objective:   Physical Exam  Well-developed, well-nourished.  No acute distress.  Awake alert and oriented x3.  Vital signs reviewed  Examination of both knees shows range of motion from 0 to 120 degrees.  No effusion.  She is tender to palpation along medial and lateral joint lines.  Pain but no popping with McMurray's.  Knees are grossly stable to ligamentous exam.  Neurovascularly intact distally.      Assessment & Plan:   Bilateral knee pain, left greater than right, secondary to DJD  Patient needs a total knee arthroplasty but she is unable to proceed with surgery at this time.  I have agreed to reinject her knee with cortisone today.  This was done atraumatically under sterile technique after risks and benefits were explained.  An anterior lateral approach was utilized.  She tolerates this without difficulty.  Refill of her tramadol was also provided.  Follow-up as needed.  Consent obtained and verified. Time-out conducted. Noted no overlying erythema, induration, or other signs of local infection. Skin prepped in a sterile fashion. Topical analgesic spray: Ethyl chloride. Joint: left knee Needle: 25g 1.5 inch Completed without difficulty. Meds: 3cc 1% xylocaine, 1cc (40mg ) depomedrol  Advised to call if fevers/chills, erythema, induration, drainage, or persistent bleeding.

## 2018-09-23 ENCOUNTER — Other Ambulatory Visit: Payer: Self-pay

## 2018-09-23 ENCOUNTER — Encounter: Payer: Self-pay | Admitting: Family Medicine

## 2018-09-23 ENCOUNTER — Ambulatory Visit (INDEPENDENT_AMBULATORY_CARE_PROVIDER_SITE_OTHER): Payer: Medicare Other | Admitting: Family Medicine

## 2018-09-23 VITALS — BP 112/76 | HR 86 | Temp 97.7°F | Ht 64.0 in | Wt 266.8 lb

## 2018-09-23 DIAGNOSIS — G43909 Migraine, unspecified, not intractable, without status migrainosus: Secondary | ICD-10-CM | POA: Diagnosis not present

## 2018-09-23 DIAGNOSIS — M5090 Cervical disc disorder, unspecified, unspecified cervical region: Secondary | ICD-10-CM | POA: Diagnosis not present

## 2018-09-23 DIAGNOSIS — G44229 Chronic tension-type headache, not intractable: Secondary | ICD-10-CM | POA: Diagnosis not present

## 2018-09-23 MED ORDER — KETOROLAC TROMETHAMINE 30 MG/ML IJ SOLN
30.0000 mg | Freq: Once | INTRAMUSCULAR | Status: AC
  Administered 2018-09-23: 30 mg via INTRAMUSCULAR

## 2018-09-23 MED ORDER — HYDROCODONE-ACETAMINOPHEN 5-325 MG PO TABS: 1.0000 | ORAL_TABLET | Freq: Four times a day (QID) | ORAL | 0 refills | Status: DC | PRN

## 2018-09-23 MED ORDER — IBUPROFEN 800 MG PO TABS: 800.0000 mg | ORAL_TABLET | Freq: Three times a day (TID) | ORAL | 1 refills | Status: DC | PRN

## 2018-09-23 NOTE — Patient Instructions (Signed)
It was good to see you today.  We are going to try and break your headaches.  Toradol shot here for pain and inflammation.  Take the hydrocodone as needed over the next day or so.  You shouldn't need this beyond that.   Take the Ibuprofen every 8 hours for the next 3 days, then as needed after that.    I will call and check on you later this week.

## 2018-09-23 NOTE — Progress Notes (Signed)
Subjective:    Kendra [name] is a 53 y.o. female who presents to [facility] today for headaches:  1.  Headaches:  Current symptoms present for about 3 weeks.  Describes baseline pressure around her eyes that comes and goes but is there more times than not.  Sharp stabbing pain alternating with dull ache.  Bilateral.  Around eyes.  No changes in vision.  No nausea or vomiting.  No abdominal pain.  No numbness or tingling in her arms or legs.  No falls.  No weakness that she has noted.  No neck stiffness.  Occasional but not consistent photophobia/phonophobia. Does have increasing stressors at home.   She has a history of "migraines" from when she was a younger child.  She reports that she hit her head in her early 87s and suffered migraines for months to years afterwards.  These gradually resolved.  She described this pain is feeling just like that.  She has not had any further headaches like this since then.  She does occasionally get headaches but these usually resolve with either tramadol or over-the-counter analgesics.  She denies any improvement with tramadol.  No improvement with Tylenol or ibuprofen or naproxen that she has noted.  Describes pain as 6-7 out of 10.  Hasn't tried anything else for relief.  Waited 3 weeks to come in because she thought it would otherwise resolve.  Not worst headache of her life.    ROS as above per HPI.    The following portions of the patient's history were reviewed and updated as appropriate: allergies, current medications, past medical history, family and social history, and problem list. Patient is a nonsmoker.    PMH reviewed.  Past Medical History:  Diagnosis Date  . Anemia   . Asthma   . Esophagitis   . GERD (gastroesophageal reflux disease)   . HTN (hypertension)   . Hyperplastic colon polyp    Past Surgical History:  Procedure Laterality Date  . ABDOMINAL HYSTERECTOMY  04/12/15  . ANKLE SURGERY Left   . CESAREAN SECTION    . CYSTOSCOPY N/A  04/12/2015   Procedure: CYSTOSCOPY;  Surgeon: Lavonia Drafts, MD;  Location: [facility];  Service: Gynecology;  Laterality: N/A;  . FINGER SURGERY    . TIBIA FRACTURE SURGERY    . TUBAL LIGATION      Medications reviewed. Current Outpatient Medications  Medication Sig Dispense Refill  . acetaminophen (TYLENOL 8 HOUR) 650 MG CR tablet Take 1 tablet (650 mg total) by mouth every 8 (eight) hours as needed for pain. 30 tablet 1  . albuterol (PROAIR HFA) 108 (90 Base) MCG/ACT inhaler inhale 2 puffs every 4 hours if needed wheezing or shortness of breath 8.5 g 3  . alum & mag hydroxide-simeth (MAALOX/MYLANTA) 200-200-20 MG/5ML suspension Take 30 mLs by mouth every 6 (six) hours as needed for indigestion or heartburn. Reported on 10/26/2015    . atorvastatin (LIPITOR) 40 MG tablet Take 1 tablet (40 mg total) by mouth daily. 90 tablet 3  . buPROPion (WELLBUTRIN) 75 MG tablet TAKE 1 TABLET(75 MG) BY MOUTH TWICE DAILY 60 tablet 0  . docusate sodium (COLACE) 100 MG capsule Take 1 capsule (100 mg total) by mouth 2 (two) times daily. (Patient taking differently: Take 100 mg by mouth 2 (two) times daily as needed for mild constipation. ) 10 capsule 0  . furosemide (LASIX) 20 MG tablet take 1 tablet by mouth once daily 90 tablet 1  . gabapentin (NEURONTIN) 300 MG capsule Take  1 capsule (300 mg total) by mouth at bedtime. 30 capsule 5  . lisinopril (PRINIVIL,ZESTRIL) 10 MG tablet Take 1 tablet (10 mg total) by mouth daily. 90 tablet 0  . pantoprazole (PROTONIX) 40 MG tablet Take 1 tablet (40 mg total) by mouth 2 (two) times daily before a meal. 180 tablet 0  . traMADol (ULTRAM) 50 MG tablet Take 1 tablet by mouth every 12 hours if needed. 60 tablet 0   No current facility-administered medications for this visit.      Objective:   Physical Exam BP 112/76   Pulse 86   Temp 97.7 F (36.5 C) (Oral)   Ht 5\' 4"  (1.626 m)   Wt 266 lb 12.8 oz (121 kg)   LMP 02/21/2015 (Approximate)   SpO2 93%   BMI  45.80 kg/m  Gen:  Alert, cooperative patient who appears stated age in no acute distress.  Vital signs reviewed.  Sitting comfortably with lights on.   Head: [place]/AT.   Eyes:  EOMI, PERRL.   Ears:  External ears WNL, Bilateral TM's normal without retraction, redness or bulging. Nose:  Septum midline  Mouth:  MMM, tonsils non-erythematous, non-edematous.   Neck:  No stiffness.  No LAD Cardiac:  Regular rate and rhythm without murmur auscultated.  Good S1/S2. Pulm:  Clear to auscultation bilaterally with good air movement.  No wheezes or rales noted.   Abd:  Soft/NT/ND Ext:  No LE edema MSK: Tender palpation along superior trapezius muscles and base of her neck.  She has some mild tenderness to palpation bilaterally with palpable spasm bilaterally along the paracervical muscles of her neck.  Multiple trigger points.  She has no pain with rotation or full active or passive range of motion of her arms laterally, above her head, forward. - Directly tender to pinpoint palpation where temporalis muscles inserts on skull bilaterally.  This is spot of her headaches. Neuro:  Alert and oriented to person, place, and date.  CN II-XII intact.  Sensation intact to light touch, pain, and vibration bilateral upper and lower extremities equally.  Motor function equal and strength 5/5 bilateral upper and lower extremities.  Normal gait.  Finger to nose cerebellar testing within normal limits.  DTRs normal BL ankles.

## 2018-09-25 ENCOUNTER — Other Ambulatory Visit: Payer: Self-pay | Admitting: Neurology

## 2018-09-25 ENCOUNTER — Encounter: Payer: Self-pay | Admitting: Family Medicine

## 2018-09-25 DIAGNOSIS — R519 Headache, unspecified: Secondary | ICD-10-CM | POA: Insufficient documentation

## 2018-09-25 DIAGNOSIS — R51 Headache: Secondary | ICD-10-CM

## 2018-09-25 NOTE — Assessment & Plan Note (Signed)
Has a history of disc stenosis extending back past 6 7 years.  She does have some mild pain today.  Could be trigger of her headaches.  Headaches do seem to be tension headaches.  See headaches below.

## 2018-09-25 NOTE — Assessment & Plan Note (Addendum)
Headaches appear to be tension type.  She does not have typical migraine symptoms.  Especially think tension type headaches because of pinpoint tenderness of temporalis muscles.  Increased stressors noted.  -Other than this and some tenderness around the base of her neck she has a completely benign physical examination. -She tells me nothing that would be a concerning red flag from history about her neck pain. -Sounds like she might have some rebound headache as she has been chronically taking analgesics and tramadol for pain relief. -We are going to attempt to break the headaches here.  Toradol shot here.  Very short-term course of hydrocodone to use at home plus scheduled ibuprofen for the next couple days.  Her creatinine looks good for this.  See AVS -I will call her later this week to check on her and see how she is doing. -Warning precautions and red flag precautions provided.

## 2018-09-26 ENCOUNTER — Other Ambulatory Visit: Payer: Self-pay | Admitting: Family Medicine

## 2018-09-26 ENCOUNTER — Telehealth: Payer: Self-pay | Admitting: Family Medicine

## 2018-09-26 NOTE — Telephone Encounter (Signed)
Called to check on patient for her headaches.  Went to Psychologist, occupational.  Left message to call us back to let us know how she's doing.

## 2018-09-29 ENCOUNTER — Ambulatory Visit: Payer: Medicare Other | Admitting: Neurology

## 2018-10-07 ENCOUNTER — Other Ambulatory Visit: Payer: Self-pay

## 2018-10-07 ENCOUNTER — Other Ambulatory Visit: Payer: Self-pay | Admitting: Family Medicine

## 2018-10-07 DIAGNOSIS — F17200 Nicotine dependence, unspecified, uncomplicated: Secondary | ICD-10-CM

## 2018-10-07 MED ORDER — MELOXICAM 15 MG PO TABS: 15.0000 mg | ORAL_TABLET | Freq: Every day | ORAL | 1 refills | Status: DC

## 2018-10-07 NOTE — Telephone Encounter (Signed)
Pharmacy refill request for Meloxicam   Per Dr. Amalia Hailey verbal order, ok to refill    Script has been sent to pharmacy

## 2018-10-23 ENCOUNTER — Emergency Department (HOSPITAL_COMMUNITY): Payer: Medicare Other

## 2018-10-23 ENCOUNTER — Emergency Department (HOSPITAL_COMMUNITY)
Admission: EM | Admit: 2018-10-23 | Discharge: 2018-10-23 | Disposition: A | Discharge status: Home / Self Care | Payer: Medicare Other | Attending: Emergency Medicine | Admitting: Emergency Medicine

## 2018-10-23 ENCOUNTER — Encounter (HOSPITAL_COMMUNITY): Payer: Self-pay | Admitting: *Deleted

## 2018-10-23 ENCOUNTER — Other Ambulatory Visit: Payer: Self-pay

## 2018-10-23 DIAGNOSIS — M25512 Pain in left shoulder: Secondary | ICD-10-CM | POA: Insufficient documentation

## 2018-10-23 DIAGNOSIS — Y999 Unspecified external cause status: Secondary | ICD-10-CM | POA: Diagnosis not present

## 2018-10-23 DIAGNOSIS — R079 Chest pain, unspecified: Secondary | ICD-10-CM | POA: Diagnosis not present

## 2018-10-23 DIAGNOSIS — Y939 Activity, unspecified: Secondary | ICD-10-CM | POA: Insufficient documentation

## 2018-10-23 DIAGNOSIS — M542 Cervicalgia: Secondary | ICD-10-CM | POA: Insufficient documentation

## 2018-10-23 DIAGNOSIS — Y929 Unspecified place or not applicable: Secondary | ICD-10-CM | POA: Diagnosis not present

## 2018-10-23 DIAGNOSIS — F17210 Nicotine dependence, cigarettes, uncomplicated: Secondary | ICD-10-CM | POA: Insufficient documentation

## 2018-10-23 DIAGNOSIS — Z79899 Other long term (current) drug therapy: Secondary | ICD-10-CM | POA: Insufficient documentation

## 2018-10-23 DIAGNOSIS — M545 Low back pain: Secondary | ICD-10-CM | POA: Diagnosis not present

## 2018-10-23 DIAGNOSIS — I10 Essential (primary) hypertension: Secondary | ICD-10-CM | POA: Diagnosis not present

## 2018-10-23 DIAGNOSIS — Z041 Encounter for examination and observation following transport accident: Secondary | ICD-10-CM | POA: Diagnosis present

## 2018-10-23 MED ORDER — METHOCARBAMOL 500 MG PO TABS: 500.0000 mg | ORAL_TABLET | Freq: Two times a day (BID) | ORAL | 0 refills | Status: AC

## 2018-10-23 NOTE — Discharge Instructions (Signed)
Your xray today was negative. I have prescribed muscle relaxers for your pain, please do not drink or drive while taking this medications as they can make you drowsy.Please follow-up with PCP in 1 week for reevaluation of your symptoms.  You experience any bowel or bladder incontinence, fever, worsening in your symptoms please return to the ED.
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
## 2018-10-23 NOTE — ED Notes (Signed)
Declined W/C at D/C and was escorted to lobby by RN. 

## 2018-10-25 NOTE — Progress Notes (Deleted)
Follow-up Visit   Date: 10/25/18   Kendra [name] MRN: [id number] DOB: 07/29/1966   Interim History: Kendra [name] is a 53 y.o. right-handed African American female with hypertension, GERD, and tobacco abuse  returning to the clinic for follow-up of ***.  The patient was accompanied to the clinic by *** who also provides collateral information.    History of present illness: Starting in January 2019, she began having tingling and numbness of the toes which has gradually radiated up her lower lateral leg, and posteroir thigh. Symptoms are constant, there are no specific triggers or alleviating factors.  There was no preceding back injury or illness.  She endorses low back pain. She has noticed mild weakness of the left foot.  No associated falls or difficulty with walking.  She does not have similar symptoms on the right leg.   UPDATE 04/01/2018:  She is here to undergo electrodiagnostic testing as well as follow-up visit. There has been no marked change in her left leg numbness and tingling. She started physical therapy last week, and it is too early to appreciate any benefit.  UPDATE 10/26/2018:  She is here for follow-up visit.  EDX of the leg showed mild S1 radiculopathy.  She completed physical therapy ***  Medications:  Current Outpatient Medications on File Prior to Visit  Medication Sig Dispense Refill  . acetaminophen (TYLENOL 8 HOUR) 650 MG CR tablet Take 1 tablet (650 mg total) by mouth every 8 (eight) hours as needed for pain. 30 tablet 1  . albuterol (PROAIR HFA) 108 (90 Base) MCG/ACT inhaler inhale 2 puffs every 4 hours if needed wheezing or shortness of breath 8.5 g 3  . alum & mag hydroxide-simeth (MAALOX/MYLANTA) 200-200-20 MG/5ML suspension Take 30 mLs by mouth every 6 (six) hours as needed for indigestion or heartburn. Reported on 10/26/2015    . atorvastatin (LIPITOR) 40 MG tablet Take 1 tablet (40 mg total) by mouth daily. 90 tablet 3  . buPROPion (WELLBUTRIN) 75 MG  tablet TAKE 1 TABLET BY MOUTH TWICE DAILY 60 tablet 0  . docusate sodium (COLACE) 100 MG capsule Take 1 capsule (100 mg total) by mouth 2 (two) times daily. (Patient taking differently: Take 100 mg by mouth 2 (two) times daily as needed for mild constipation. ) 10 capsule 0  . furosemide (LASIX) 20 MG tablet take 1 tablet by mouth once daily 90 tablet 1  . gabapentin (NEURONTIN) 300 MG capsule TAKE 1 CAPSULE(300 MG) BY MOUTH AT BEDTIME 30 capsule 5  . HYDROcodone-acetaminophen (NORCO) 5-325 MG tablet Take 1 tablet by mouth every 6 (six) hours as needed for moderate pain. 5 tablet 0  . ibuprofen (ADVIL,MOTRIN) 800 MG tablet Take 1 tablet (800 mg total) by mouth every 8 (eight) hours as needed. 30 tablet 1  . lisinopril (PRINIVIL,ZESTRIL) 10 MG tablet TAKE 1 TABLET(10 MG) BY MOUTH DAILY 90 tablet 0  . meloxicam (MOBIC) 15 MG tablet Take 1 tablet (15 mg total) by mouth daily. 60 tablet 1  . methocarbamol (ROBAXIN) 500 MG tablet Take 1 tablet (500 mg total) by mouth 2 (two) times daily for 7 days. 14 tablet 0  . pantoprazole (PROTONIX) 40 MG tablet Take 1 tablet (40 mg total) by mouth 2 (two) times daily before a meal. 180 tablet 0  . traMADol (ULTRAM) 50 MG tablet Take 1 tablet by mouth every 12 hours if needed. 60 tablet 0   No current facility-administered medications on file prior to visit.  Allergies:  Allergies  Allergen Reactions  . Lisinopril Swelling    Review of Systems:  CONSTITUTIONAL: No fevers, chills, night sweats, or weight loss.  EYES: No visual changes or eye pain ENT: No hearing changes.  No history of nose bleeds.   RESPIRATORY: No cough, wheezing and shortness of breath.   CARDIOVASCULAR: Negative for chest pain, and palpitations.   GI: Negative for abdominal discomfort, blood in stools or black stools.  No recent change in bowel habits.   GU:  No history of incontinence.   MUSCLOSKELETAL: No history of joint pain or swelling.  No myalgias.   SKIN: Negative for  lesions, rash, and itching.   ENDOCRINE: Negative for cold or heat intolerance, polydipsia or goiter.   PSYCH:  *** depression or anxiety symptoms.   NEURO: As Above.   Vital Signs:  LMP 02/21/2015 (Approximate)    General Medical Exam:   General:  Well appearing, comfortable  Eyes/ENT: see cranial nerve examination.   Neck:  No carotid bruits. Respiratory:  Clear to auscultation, good air entry bilaterally.   Cardiac:  Regular rate and rhythm, no murmur.   Ext:  No edema ***  Neurological Exam: MENTAL STATUS including orientation to time, place, person, recent and remote memory, attention span and concentration, language, and fund of knowledge is ***normal.  Speech is not dysarthric.  CRANIAL NERVES:  No visual field defects.  Pupils equal round and reactive to light.  Normal conjugate, extra-ocular eye movements in all directions of gaze.  No ptosis ***.  Face is symmetric. Palate elevates symmetrically.  Tongue is midline.  MOTOR:  Motor strength is 5/5 in all extremities, ***.  No atrophy, fasciculations or abnormal movements.  No pronator drift.  Tone is normal.    MSRs:  Reflexes are 2+/4 throughout ***.  SENSORY:  Intact to vibration throughout ***.  COORDINATION/GAIT:  Normal finger-to- nose-finger.  Intact rapid alternating movements bilaterally.  Gait narrow based and stable.   Data: NCS/EMG of the left leg 04/01/2018:  Chronic S1 radiculopathy affecting the left lower extremity, mild in degree electrically.  IMPRESSION/PLAN: Chronic S1 radiculopathy affecting the left leg manifesting predominately with sensory changes.  She has compelted physical therapy and reports ***. Gabapentin 300mg  at bedtime MRI lumbar spine, if symptoms get worse  Return to clinic in ***.    Thank you for allowing me to participate in patient's care.  If I can answer any additional questions, I would be pleased to do so.    Sincerely,    Azari Janssens K. Posey Pronto, DO

## 2018-10-27 ENCOUNTER — Ambulatory Visit: Payer: Medicare Other | Admitting: Neurology

## 2018-10-27 ENCOUNTER — Other Ambulatory Visit: Payer: Self-pay

## 2018-10-29 MED ORDER — ALBUTEROL SULFATE HFA 108 (90 BASE) MCG/ACT IN AERS: INHALATION_SPRAY | RESPIRATORY_TRACT | 3 refills | Status: DC

## 2018-11-03 ENCOUNTER — Encounter: Payer: Self-pay | Admitting: Sports Medicine

## 2018-11-03 ENCOUNTER — Ambulatory Visit (INDEPENDENT_AMBULATORY_CARE_PROVIDER_SITE_OTHER): Payer: Medicare Other | Admitting: Sports Medicine

## 2018-11-03 VITALS — BP 137/82 | Ht 64.0 in | Wt 260.0 lb

## 2018-11-03 DIAGNOSIS — M25512 Pain in left shoulder: Secondary | ICD-10-CM | POA: Diagnosis not present

## 2018-11-03 DIAGNOSIS — M25552 Pain in left hip: Secondary | ICD-10-CM | POA: Diagnosis not present

## 2018-11-03 MED ORDER — METHYLPREDNISOLONE ACETATE 80 MG/ML IJ SUSP
80.0000 mg | Freq: Once | INTRAMUSCULAR | Status: AC
  Administered 2018-11-03: 80 mg via INTRAMUSCULAR
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
## 2018-11-08 ENCOUNTER — Other Ambulatory Visit: Payer: Self-pay | Admitting: Internal Medicine

## 2018-11-08 DIAGNOSIS — K219 Gastro-esophageal reflux disease without esophagitis: Secondary | ICD-10-CM

## 2018-11-24 ENCOUNTER — Ambulatory Visit: Payer: Medicare Other | Admitting: Neurology

## 2018-11-24 ENCOUNTER — Ambulatory Visit: Payer: Medicare Other | Admitting: Physical Therapy

## 2018-12-24 ENCOUNTER — Ambulatory Visit (INDEPENDENT_AMBULATORY_CARE_PROVIDER_SITE_OTHER): Payer: Medicare Other | Admitting: Family Medicine

## 2018-12-24 ENCOUNTER — Other Ambulatory Visit: Payer: Self-pay

## 2018-12-24 VITALS — BP 120/68 | HR 68

## 2018-12-24 DIAGNOSIS — G8929 Other chronic pain: Secondary | ICD-10-CM

## 2018-12-24 DIAGNOSIS — M5441 Lumbago with sciatica, right side: Secondary | ICD-10-CM

## 2018-12-24 DIAGNOSIS — I10 Essential (primary) hypertension: Secondary | ICD-10-CM

## 2018-12-24 MED ORDER — MELOXICAM 15 MG PO TABS: 15.0000 mg | ORAL_TABLET | Freq: Every day | ORAL | 1 refills | Status: DC

## 2018-12-24 NOTE — Patient Instructions (Addendum)
Low Back Pain Rehab Ask your health care provider which exercises are safe for you. Do exercises exactly as told by your health care provider and adjust them as directed. It is normal to feel mild stretching, pulling, tightness, or discomfort as you do these exercises, but you should stop right away if you feel sudden pain or your pain gets worse.Do not begin these exercises until told by your health care provider. Stretching and range of motion exercises These exercises warm up your muscles and joints and improve the movement and flexibility of your hips and your back. These exercises also help to relieve pain, numbness, and tingling. Exercise A: Sciatic nerve glide 1. Sit in a chair with your head facing down toward your chest. Place your hands behind your back. Let your shoulders slump forward. 2. Slowly straighten one of your knees while you tilt your head back as if you are looking toward the ceiling. Only straighten your leg as far as you can without making your symptoms worse. 3. Hold for __________ seconds. 4. Slowly return to the starting position. 5. Repeat with your other leg. Repeat __________ times. Complete this exercise __________ times a day. Exercise B: Knee to chest with hip adduction and internal rotation  1. Lie on your back on a firm surface with both legs straight. 2. Bend one of your knees and move it up toward your chest until you feel a gentle stretch in your lower back and buttock. Then, move your knee toward the shoulder that is on the opposite side from your leg. ? Hold your leg in this position by holding onto the front of your knee. 3. Hold for __________ seconds. 4. Slowly return to the starting position. 5. Repeat with your other leg. Repeat __________ times. Complete this exercise __________ times a day. Exercise C: Prone extension on elbows  1. Lie on your abdomen on a firm surface. A bed may be too soft for this exercise. 2. Prop yourself up on your  elbows. 3. Use your arms to help lift your chest up until you feel a gentle stretch in your abdomen and your lower back. ? This will place some of your body weight on your elbows. If this is uncomfortable, try stacking pillows under your chest. ? Your hips should stay down, against the surface that you are lying on. Keep your hip and back muscles relaxed. 4. Hold for __________ seconds. 5. Slowly relax your upper body and return to the starting position. Repeat __________ times. Complete this exercise __________ times a day. Strengthening exercises These exercises build strength and endurance in your back. Endurance is the ability to use your muscles for a long time, even after they get tired. Exercise D: Pelvic tilt 1. Lie on your back on a firm surface. Bend your knees and keep your feet flat. 2. Tense your abdominal muscles. Tip your pelvis up toward the ceiling and flatten your lower back into the floor. ? To help with this exercise, you may place a small towel under your lower back and try to push your back into the towel. 3. Hold for __________ seconds. 4. Let your muscles relax completely before you repeat this exercise. Repeat __________ times. Complete this exercise __________ times a day. Exercise E: Alternating arm and leg raises  1. Get on your hands and knees on a firm surface. If you are on a hard floor, you may want to use padding to cushion your knees, such as an exercise mat. 2. Line up  your arms and legs. Your hands should be below your shoulders, and your knees should be below your hips. 3. Lift your left leg behind you. At the same time, raise your right arm and straighten it in front of you. ? Do not lift your leg higher than your hip. ? Do not lift your arm higher than your shoulder. ? Keep your abdominal and back muscles tight. ? Keep your hips facing the ground. ? Do not arch your back. ? Keep your balance carefully, and do not hold your breath. 4. Hold for  __________ seconds. 5. Slowly return to the starting position and repeat with your right leg and your left arm. Repeat __________ times. Complete this exercise __________ times a day. Posture and body mechanics  Body mechanics refers to the movements and positions of your body while you do your daily activities. Posture is part of body mechanics. Good posture and healthy body mechanics can help to relieve stress in your body's tissues and joints. Good posture means that your spine is in its natural S-curve position (your spine is neutral), your shoulders are pulled back slightly, and your head is not tipped forward. The following are general guidelines for applying improved posture and body mechanics to your everyday activities. Standing   When standing, keep your spine neutral and your feet about hip-width apart. Keep a slight bend in your knees. Your ears, shoulders, and hips should line up.  When you do a task in which you stand in one place for a long time, place one foot up on a stable object that is 2-4 inches (5-10 cm) high, such as a footstool. This helps keep your spine neutral. Sitting   When sitting, keep your spine neutral and keep your feet flat on the floor. Use a footrest, if necessary, and keep your thighs parallel to the floor. Avoid rounding your shoulders, and avoid tilting your head forward.  When working at a desk or a computer, keep your desk at a height where your hands are slightly lower than your elbows. Slide your chair under your desk so you are close enough to maintain good posture.  When working at a computer, place your monitor at a height where you are looking straight ahead and you do not have to tilt your head forward or downward to look at the screen. Resting   When lying down and resting, avoid positions that are most painful for you.  If you have pain with activities such as sitting, bending, stooping, or squatting (flexion-based activities), lie in a  position in which your body does not bend very much. For example, avoid curling up on your side with your arms and knees near your chest (fetal position).  If you have pain with activities such as standing for a long time or reaching with your arms (extension-based activities), lie with your spine in a neutral position and bend your knees slightly. Try the following positions: ? Lying on your side with a pillow between your knees. ? Lying on your back with a pillow under your knees. Lifting   When lifting objects, keep your feet at least shoulder-width apart and tighten your abdominal muscles.  Bend your knees and hips and keep your spine neutral. It is important to lift using the strength of your legs, not your back. Do not lock your knees straight out.  Always ask for help to lift heavy or awkward objects. This information is not intended to replace advice given to you by your  health care provider. Make sure you discuss any questions you have with your health care provider. Document Released: 08/13/2005 Document Revised: 04/19/2016 Document Reviewed: 04/29/2015 Elsevier Interactive Patient Education  2019 [employer].   Hip Exercises Ask your health care provider which exercises are safe for you. Do exercises exactly as told by your health care provider and adjust them as directed. It is normal to feel mild stretching, pulling, tightness, or discomfort as you do these exercises, but you should stop right away if you feel sudden pain or your pain gets worse.Do not begin these exercises until told by your health care provider. Stretching and range of motion exercises These exercises warm up your muscles and joints and improve the movement and flexibility of your hip. These exercises also help to relieve pain, numbness, and tingling. Exercise A: Hamstrings, supine  1. Lie on your back. 2. Loop a belt or towel over the ball of your left / rightfoot. The ball of your foot is on the walking  surface, right under your toes. 3. Straighten your left / rightknee and slowly pull on the belt to raise your leg. ? Do not let your left / right knee bend while you do this. ? Keep your other leg flat on the floor. ? Raise the left / right leg until you feel a gentle stretch behind your left / right knee or thigh. 4. Hold this position for __________ seconds. 5. Slowly return your leg to the starting position. Repeat __________ times. Complete this stretch __________ times a day. Exercise B: Hip rotators  1. Lie on your back on a firm surface. 2. Hold your left / right knee with your left / right hand. Hold your ankle with your other hand. 3. Gently pull your left / right knee and rotate your lower leg toward your other shoulder. ? Pull until you feel a stretch in your buttocks. ? Keep your hips and shoulders firmly planted while you do this stretch. 4. Hold this position for __________ seconds. Repeat __________ times. Complete this stretch __________ times a day. Exercise C: V-sit (hamstrings and adductors)  1. Sit on the floor with your legs extended in a large "V" shape. Keep your knees straight during this exercise. 2. Start with your head and chest upright, then bend at your waist to reach for your left foot (position A). You should feel a stretch in your right inner thigh. 3. Hold this position for __________ seconds. Then slowly return to the upright position. 4. Bend at your waist to reach forward (position B). You should feel a stretch behind both of your thighs and knees. 5. Hold this position for __________ seconds. Then slowly return to the upright position. 6. Bend at your waist to reach for your right foot (position C). You should feel a stretch in your left inner thigh. 7. Hold this position for __________ seconds. Then slowly return to the upright position. Repeat __________ times. Complete this stretch __________ times a day. Exercise D: Lunge (hip flexors)  1. Place  your left / right knee on the floor and bend your other knee so that is directly over your ankle. You should be half-kneeling. 2. Keep good posture with your head over your shoulders. 3. Tighten your buttocks to point your tailbone downward. This helps your back to keep from arching too much. 4. You should feel a gentle stretch in the front of your left / right thigh and hip. If you do not feel any resistance, slightly slide  your other foot forward and then slowly lunge forward so your knee once again lines up over your ankle. 5. Make sure your tailbone continues to point downward. 6. Hold this position for __________ seconds. Repeat __________ times. Complete this stretch __________ times a day. Strengthening exercises These exercises build strength and endurance in your hip. Endurance is the ability to use your muscles for a long time, even after they get tired. Exercise E: Bridge (hip extensors)  1. Lie on your back on a firm surface with your knees bent and your feet flat on the floor. 2. Tighten your buttocks muscles and lift your bottom off the floor until the trunk of your body is level with your thighs. ? Do not arch your back. ? You should feel the muscles working in your buttocks and the back of your thighs. If you do not feel these muscles, slide your feet 1-2 inches (2.5-5 cm) farther away from your buttocks. 3. Hold this position for __________ seconds. 4. Slowly lower your hips to the starting position. 5. Let your muscles relax completely between repetitions. 6. If this exercise is too easy, try doing it with your arms crossed over your chest. Repeat __________ times. Complete this exercise __________ times a day. Exercise F: Straight leg raises - hip abductors  1. Lie on your side with your left / right leg in the top position. Lie so your head, shoulder, knee, and hip line up with each other. You may bend your bottom knee to help you balance. 2. Roll your hips slightly  forward, so your hips are stacked directly over each other and your left / right knee is facing forward. 3. Leading with your heel, lift your top leg 4-6 inches (10-15 cm). You should feel the muscles in your outer hip lifting. ? Do not let your foot drift forward. ? Do not let your knee roll toward the ceiling. 4. Hold this position for __________ seconds. 5. Slowly return to the starting position. 6. Let your muscles relax completely between repetitions. Repeat __________ times. Complete this exercise __________ times a day. Exercise G: Straight leg raises - hip adductors  1. Lie on your side with your left / right leg in the bottom position. Lie so your head, shoulder, knee, and hip line up. You may place your upper foot in front to help you balance. 2. Roll your hips slightly forward, so your hips are stacked directly over each other and your left / right knee is facing forward. 3. Tense the muscles in your inner thigh and lift your bottom leg 4-6 inches (10-15 cm). 4. Hold this position for __________ seconds. 5. Slowly return to the starting position. 6. Let your muscles relax completely between repetitions. Repeat __________ times. Complete this exercise __________ times a day. Exercise H: Straight leg raises - quadriceps  1. Lie on your back with your left / right leg extended and your other knee bent. 2. Tense the muscles in the front of your left / right thigh. When you do this, you should see your kneecap slide up or see increased dimpling just above your knee. 3. Tighten these muscles even more and raise your leg 4-6 inches (10-15 cm) off the floor. 4. Hold this position for __________ seconds. 5. Keep these muscles tense as you lower your leg. 6. Relax the muscles slowly and completely between repetitions. Repeat __________ times. Complete this exercise __________ times a day. Exercise I: Hip abductors, standing 1. Tie one end of a rubber  exercise band or tubing to a secure  surface, such as a table or pole. 2. Loop the other end of the band or tubing around your left / right ankle. 3. Keeping your ankle with the band or tubing directly opposite of the secured end, step away until there is tension in the tubing or band. Hold onto a chair as needed for balance. 4. Lift your left / right leg out to your side. While you do this: ? Keep your back upright. ? Keep your shoulders over your hips. ? Keep your toes pointing forward. ? Make sure to use your hip muscles to lift your leg. Do not "throw" your leg or tip your body to lift your leg. 5. Hold this position for __________ seconds. 6. Slowly return to the starting position. Repeat __________ times. Complete this exercise __________ times a day. Exercise J: Squats (quadriceps) 1. Stand in a door frame so your feet and knees are in line with the frame. You may place your hands on the frame for balance. 2. Slowly bend your knees and lower your hips like you are going to sit in a chair. ? Keep your lower legs in a straight-up-and-down position. ? Do not let your hips go lower than your knees. ? Do not bend your knees lower than told by your health care provider. ? If your hip pain increases, do not bend as low. 3. Hold this position for ___________ seconds. 4. Slowly push with your legs to return to standing. Do not use your hands to pull yourself to standing. Repeat __________ times. Complete this exercise __________ times a day. This information is not intended to replace advice given to you by your health care provider. Make sure you discuss any questions you have with your health care provider. Document Released: 08/31/2005 Document Revised: 12/17/2017 Document Reviewed: 08/08/2015 Elsevier Interactive Patient Education  2019 [employer].

## 2018-12-24 NOTE — Assessment & Plan Note (Signed)
Doing well on her lisinopril, continue current management.  She is due for a BMP and since she is in office today we will obtain

## 2018-12-24 NOTE — Assessment & Plan Note (Signed)
Patient has chronic low back pain now with SI joint involvement.  She has no red flag symptoms and is able to bear weight on exam.  Refill of Mobic.

## 2018-12-24 NOTE — Progress Notes (Signed)
    Subjective:  Kendra [name] is a 53 y.o. female who presents to the [facility] today with a chief complaint of back pain.   HPI:  She states that she has had longstanding chronic back pain as well as knee pain.  Is now been wrapping more around her side and into her hip. Says that it feels like the same pain, is achiness that is worse with long periods of standing.  It does travel down her leg.  She has no bowel or bladder incontinence.  She has had no weakness or falls.  She recalls going to physical therapy for this remotely in the past was helpful, but was unable to keep going she has transportation issues.  She has taken tramadol for this in the past which is only been helpful sometimes.  She has found Mobic to be the most helpful.  Hypertension Takes lisinopril and doing well. No lightheadness or dizziness.   ROS: Per HPI   CC, SH/smoking status, and VS noted  Objective:  Physical Exam: BP 120/68   Pulse 68   LMP 02/21/2015 (Approximate)   SpO2 98%   Gen: NAD, resting comfortably MSK: TTP over R SI joint. No midline tenderness. Walking normally. Skin: warm, dry Neuro: grossly normal, moves all extremities Psych: Normal affect and thought content   Assessment/Plan:  Chronic low back pain Patient has chronic low back pain now with SI joint involvement.  She has no red flag symptoms and is able to bear weight on exam.  Refill of Mobic.  HTN (hypertension) Doing well on her lisinopril, continue current management.  She is due for a BMP and since she is in office today we will obtain    Orders Placed This Encounter  Procedures  . Basic Metabolic Panel    Meds ordered this encounter  Medications  . meloxicam (MOBIC) 15 MG tablet    Sig: Take 1 tablet (15 mg total) by mouth daily.    Dispense:  60 tablet    Refill:  [place], DO PGY-3, [facility] 12/24/2018 1:41 PM

## 2018-12-25 ENCOUNTER — Encounter: Payer: Self-pay | Admitting: Family Medicine

## 2018-12-25 LAB — BASIC METABOLIC PANEL
BUN/Creatinine Ratio: 21 (ref 9–23)
BUN: 15 mg/dL (ref 6–24)
CO2: 19 mmol/L — ABNORMAL LOW (ref 20–29)
Calcium: 10.1 mg/dL (ref 8.7–10.2)
Chloride: 108 mmol/L — ABNORMAL HIGH (ref 96–106)
Creatinine, Ser: 0.73 mg/dL (ref 0.57–1.00)
GFR calc Af Amer: 110 mL/min/{1.73_m2} (ref >59)
GFR calc non Af Amer: 95 mL/min/{1.73_m2} (ref >59)
Glucose: 81 mg/dL (ref 65–99)
Potassium: 4.4 mmol/L (ref 3.5–5.2)
Sodium: 140 mmol/L (ref 134–144)

## 2019-01-08 ENCOUNTER — Ambulatory Visit (INDEPENDENT_AMBULATORY_CARE_PROVIDER_SITE_OTHER): Payer: Medicare Other | Admitting: Family Medicine

## 2019-01-08 ENCOUNTER — Other Ambulatory Visit: Payer: Self-pay

## 2019-01-08 ENCOUNTER — Other Ambulatory Visit: Payer: Self-pay | Admitting: Sports Medicine

## 2019-01-08 ENCOUNTER — Encounter: Payer: Self-pay | Admitting: Family Medicine

## 2019-01-08 DIAGNOSIS — Z0000 Encounter for general adult medical examination without abnormal findings: Secondary | ICD-10-CM

## 2019-01-08 MED ORDER — TRAMADOL HCL 50 MG PO TABS: ORAL_TABLET | ORAL | 0 refills | Status: DC

## 2019-01-08 MED ORDER — GABAPENTIN 300 MG PO CAPS: ORAL_CAPSULE | ORAL | 5 refills | Status: DC

## 2019-01-08 NOTE — Progress Notes (Signed)
Subjective:   Kendra [name] is a 53 y.o. female who presents for Medicare Annual (Subsequent) preventive examination.  The patient consented to a virtual visit.  Review of Systems:  Review of Systems  Constitutional: Positive for weight loss. Negative for chills and fever.  HENT: Negative for congestion, ear pain, hearing loss, nosebleeds and sore throat.   Eyes: Negative.   Respiratory: Positive for cough, shortness of breath and wheezing.        Uses albuterol, worse after she smokes  Cardiovascular: Positive for orthopnea. Negative for chest pain, palpitations and leg swelling.  Gastrointestinal: Negative for blood in stool, constipation, diarrhea, nausea and vomiting.  Genitourinary: Negative.  Negative for dysuria.  Musculoskeletal: Positive for back pain, joint pain and neck pain. Negative for falls.  Skin: Negative for itching and rash.  Neurological: Negative for sensory change, speech change, focal weakness, seizures and loss of consciousness.  Psychiatric/Behavioral: Positive for substance abuse. Negative for depression.          Objective:     Vitals: LMP 02/21/2015 (Approximate)   There is no height or weight on file to calculate BMI.  Advanced Directives 12/24/2018 10/23/2018 06/02/2018 08/12/2017 02/18/2017 12/24/2016 02/22/2016  Does Patient Have a Medical Advance Directive? No No No No No No No  Would patient like information on creating a medical advance directive? No - Patient declined Yes (ED - Information included in AVS) No - Patient declined No - Patient declined No - Patient declined No - Patient declined No - patient declined information    Tobacco Social History   Tobacco Use  Smoking Status Current Every Day Smoker  . Packs/day: 1.50  . Types: Cigarettes  . Start date: 08/27/2002  Smokeless Tobacco Never Used  Tobacco Comment   cut back to 4-5 cigs per day from 1ppd     Ready to quit: Not Answered Counseling given: Not Answered Comment: cut back  to 4-5 cigs per day from 1ppd   Clinical Intake:  Pre-visit preparation completed: No  Pain : 0-10 Pain Score: 10-Worst pain ever Pain Type: Chronic pain Pain Location: Shoulder(shoulder, neck knees) Pain Onset: More than a month ago Pain Frequency: Constant     Nutritional Status: BMI > 30  Obese Diabetes: No  How often do you need to have someone help you when you read instructions, pamphlets, or other written materials from your doctor or pharmacy?: 3 - Sometimes        Past Medical History:  Diagnosis Date  . Anemia   . Asthma   . Esophagitis   . GERD (gastroesophageal reflux disease)   . HTN (hypertension)   . Hyperplastic colon polyp    Past Surgical History:  Procedure Laterality Date  . ABDOMINAL HYSTERECTOMY  04/12/15  . ANKLE SURGERY Left   . CESAREAN SECTION    . CYSTOSCOPY N/A 04/12/2015   Procedure: CYSTOSCOPY;  Surgeon: Lavonia Drafts, MD;  Location: [facility];  Service: Gynecology;  Laterality: N/A;  . FINGER SURGERY    . TIBIA FRACTURE SURGERY    . TUBAL LIGATION     Family History  Problem Relation Age of Onset  . Diabetes Mother   . Heart disease Mother   . Lung cancer Father 48  . Diabetes Sister   . Breast cancer Neg Hx   . Stomach cancer Neg Hx   . Colon cancer Neg Hx   . Pancreatic cancer Neg Hx    Social History   Socioeconomic History  . Marital status:  Single    Spouse name: Not on file  . Number of children: 3  . Years of education: 32  . Highest education level: Not on file  Occupational History  . Occupation: disabled- Research officer, political party Needs  . Financial resource strain: Not on file  . Food insecurity:    Worry: Not on file    Inability: Not on file  . Transportation needs:    Medical: Not on file    Non-medical: Not on file  Tobacco Use  . Smoking status: Current Every Day Smoker    Packs/day: 1.50    Types: Cigarettes    Start date: 08/27/2002  . Smokeless tobacco: Never Used  . Tobacco comment: cut back to  4-5 cigs per day from 1ppd  Substance and Sexual Activity  . Alcohol use: Yes    Alcohol/week: 0.0 standard drinks    Comment: 2 -40oz daily,  24oz daily.    . Drug use: No  . Sexual activity: Not Currently    Partners: Male    Birth control/protection: None  Lifestyle  . Physical activity:    Days per week: 0 days    Minutes per session: 0 min  . Stress: Not at all  Relationships  . Social connections:    Talks on phone: More than three times a week    Gets together: Once a week    Attends religious service: More than 4 times per year    Active member of club or organization: Not on file    Attends meetings of clubs or organizations: Not on file    Relationship status: Not on file  Other Topics Concern  . Not on file  Social History Narrative   Health Care POA:    Emergency Contact: mother, Melany Guernsey (h) [phone]   End of Life Plan:    Who lives with you: self   Any pets: none   Diet: Pt reports not eating very much.  Drinks juice and alcohol most days.    Exercise: Pt has not regular exercise routine.   Seatbelts: Pt reports wearing seatbelt when in vehicles.    Sun Exposure/Protection:    Hobbies: watching TV   Lives with son in a one story home.  Has 3 children.  On disability for arthritis.  Education: high school.    She last worked in 2013 at [employer].         Outpatient Encounter Medications as of 01/08/2019  Medication Sig  . acetaminophen (TYLENOL 8 HOUR) 650 MG CR tablet Take 1 tablet (650 mg total) by mouth every 8 (eight) hours as needed for pain.  Marland Kitchen albuterol (PROAIR HFA) 108 (90 Base) MCG/ACT inhaler inhale 2 puffs every 4 hours if needed wheezing or shortness of breath  . alum & mag hydroxide-simeth (MAALOX/MYLANTA) 200-200-20 MG/5ML suspension Take 30 mLs by mouth every 6 (six) hours as needed for indigestion or heartburn. Reported on 10/26/2015  . atorvastatin (LIPITOR) 40 MG tablet Take 1 tablet (40 mg total) by mouth daily.  Marland Kitchen buPROPion  (WELLBUTRIN) 75 MG tablet TAKE 1 TABLET BY MOUTH TWICE DAILY  . docusate sodium (COLACE) 100 MG capsule Take 1 capsule (100 mg total) by mouth 2 (two) times daily. (Patient taking differently: Take 100 mg by mouth 2 (two) times daily as needed for mild constipation. )  . furosemide (LASIX) 20 MG tablet take 1 tablet by mouth once daily  . gabapentin (NEURONTIN) 300 MG capsule TAKE 1 CAPSULE(300 MG) BY MOUTH AT BEDTIME  .  ibuprofen (ADVIL,MOTRIN) 800 MG tablet Take 1 tablet (800 mg total) by mouth every 8 (eight) hours as needed. (Patient not taking: Reported on 01/08/2019)  . lisinopril (PRINIVIL,ZESTRIL) 10 MG tablet TAKE 1 TABLET(10 MG) BY MOUTH DAILY  . meloxicam (MOBIC) 15 MG tablet Take 1 tablet (15 mg total) by mouth daily.  . pantoprazole (PROTONIX) 40 MG tablet TAKE 1 TABLET BY MOUTH 2 TIMES DAILY BEFORE A MEAL  . traMADol (ULTRAM) 50 MG tablet Take 1 tablet by mouth every 12 hours if needed. (Patient not taking: Reported on 01/08/2019)   No facility-administered encounter medications on file as of 01/08/2019.     Activities of Daily Living No flowsheet data found.  Patient Care Team: Sherene Sires, DO as PCP - General (Family Medicine)    Assessment:   This is a routine wellness examination for Kendra [name].  Exercise Activities and Dietary recommendations    Goals    . Quit smoking / using tobacco    . Weight < 240 lb (108.9 kg) (pt-stated)     Goal by 08/28/19       Fall Risk Fall Risk  12/24/2018 06/02/2018 03/14/2018 02/18/2017 02/15/2016  Falls in the past year? 0 No No No No  Risk for fall due to : - - - - -   Is the patient's home free of loose throw rugs in walkways, pet beds, electrical cords, etc?   no      Grab bars in the bathroom? no      Handrails on the stairs?   yes      Adequate lighting?   yes  Patient rating of health (0-10) scale: 8   Depression Screen PHQ 2/9 Scores 12/24/2018 09/23/2018 06/02/2018 08/12/2017  PHQ - 2 Score 0 0 0 0  Exception Documentation - -  - -     Cognitive Function MMSE - Mini Mental State Exam 01/25/2012  Orientation to time 5  Orientation to Place 5  Registration 3  Attention/ Calculation 5  Recall 2  Language- name 2 objects 2  Language- repeat 1  Language- follow 3 step command 3  Language- read & follow direction 1  Write a sentence 1  Copy design 1  Total score 29        Immunization History  Administered Date(s) Administered  . Influenza Split 05/31/2011  . Influenza,inj,Quad PF,6+ Mos 07/12/2014, 08/12/2017, 06/02/2018  . Pneumococcal Polysaccharide-23 04/13/2015  . Td 02/24/2006    Qualifies for Shingles Vaccine?no  Screening Tests Health Maintenance  Topic Date Due  . TETANUS/TDAP  02/25/2016  . PAP SMEAR-Modifier  01/10/2018  . INFLUENZA VACCINE  03/28/2019  . MAMMOGRAM  05/30/2020  . COLONOSCOPY  10/25/2025  . HIV Screening  Completed    Cancer Screenings: Lung: Low Dose CT Chest recommended if Age 42-80 years, 30 pack-year currently smoking OR have quit w/in 15years. Patient does qualify. But had 2019 CT with no concern Breast:  Up to date on Mammogram? Yes   Up to date of Bone Density/Dexa? No, used FORE score Colorectal: uptodate  Additional Screenings: : Hepatitis C Screening: future lab draw ordered     Plan:    *needs pap smear 5/29 10:10am -[facility] lab ordered  I have personally reviewed and noted the following in the patient's chart:   . Medical and social history . Use of alcohol, tobacco or illicit drugs  . Current medications and supplements . Functional ability and status . Nutritional status . Physical activity . Advanced directives . List of  other physicians . Hospitalizations, surgeries, and ER visits in previous 12 months . Vitals . Screenings to include cognitive, depression, and falls . Referrals and appointments  In addition, I have reviewed and discussed with patient certain preventive protocols, quality metrics, and best practice recommendations. A  verbal personalized care plan for preventive services as well as general preventive health recommendations were provided to patient.    This visit was conducted virtually in the setting of the [place] pandemic.    Sherene Sires, DO  01/08/2019

## 2019-01-09 ENCOUNTER — Other Ambulatory Visit: Payer: Self-pay | Admitting: Family Medicine

## 2019-01-09 DIAGNOSIS — F17200 Nicotine dependence, unspecified, uncomplicated: Secondary | ICD-10-CM

## 2019-01-15 ENCOUNTER — Other Ambulatory Visit: Payer: Self-pay

## 2019-01-15 ENCOUNTER — Ambulatory Visit: Payer: Medicare Other | Attending: Sports Medicine

## 2019-01-15 DIAGNOSIS — M25552 Pain in left hip: Secondary | ICD-10-CM | POA: Diagnosis not present

## 2019-01-15 DIAGNOSIS — R262 Difficulty in walking, not elsewhere classified: Secondary | ICD-10-CM | POA: Diagnosis not present

## 2019-01-15 DIAGNOSIS — R252 Cramp and spasm: Secondary | ICD-10-CM | POA: Diagnosis not present

## 2019-01-15 DIAGNOSIS — R293 Abnormal posture: Secondary | ICD-10-CM | POA: Insufficient documentation

## 2019-01-15 DIAGNOSIS — M25512 Pain in left shoulder: Secondary | ICD-10-CM | POA: Diagnosis not present

## 2019-01-15 DIAGNOSIS — G8929 Other chronic pain: Secondary | ICD-10-CM | POA: Diagnosis not present

## 2019-01-15 DIAGNOSIS — M542 Cervicalgia: Secondary | ICD-10-CM | POA: Diagnosis not present
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
## 2019-01-15 NOTE — Patient Instructions (Signed)
Hip flexion and adduction stretching  And neck sideb end and rotation stretching 2-3x/day 2-3 reps 10-20 sec,  Scapula and cervical retraction x 2-3 3-5x/day

## 2019-01-20 ENCOUNTER — Ambulatory Visit: Payer: Medicare Other | Admitting: Physical Therapy

## 2019-01-20 ENCOUNTER — Telehealth: Payer: Self-pay | Admitting: Physical Therapy

## 2019-01-20 NOTE — Telephone Encounter (Signed)
Left message on preferred phone's voicemail regarding no-show to appointment this morning. Left next appointment time and asked her to call if she cannot attend.

## 2019-01-21 ENCOUNTER — Ambulatory Visit: Payer: Medicare Other | Admitting: Physical Therapy

## 2019-01-21 ENCOUNTER — Other Ambulatory Visit: Payer: Self-pay

## 2019-01-21 ENCOUNTER — Encounter: Payer: Self-pay | Admitting: Physical Therapy

## 2019-01-21 DIAGNOSIS — M25552 Pain in left hip: Secondary | ICD-10-CM

## 2019-01-21 DIAGNOSIS — G8929 Other chronic pain: Secondary | ICD-10-CM

## 2019-01-21 DIAGNOSIS — M25512 Pain in left shoulder: Secondary | ICD-10-CM | POA: Diagnosis not present

## 2019-01-21 DIAGNOSIS — R293 Abnormal posture: Secondary | ICD-10-CM | POA: Diagnosis not present

## 2019-01-21 DIAGNOSIS — R262 Difficulty in walking, not elsewhere classified: Secondary | ICD-10-CM | POA: Diagnosis not present

## 2019-01-21 DIAGNOSIS — M542 Cervicalgia: Secondary | ICD-10-CM | POA: Diagnosis not present

## 2019-01-21 DIAGNOSIS — R252 Cramp and spasm: Secondary | ICD-10-CM | POA: Diagnosis not present

## 2019-01-21 NOTE — Therapy (Signed)
[place], [place], [address] Phone: [phone]   Fax:  [phone]  Physical Therapy Treatment  Patient Details  Name: Kendra [name] MRN: [id number] Date of Birth: 1965/10/28 Referring Provider (PT): Lilia Argue, DO   Encounter Date: 01/21/2019  PT End of Session - 01/21/19 1009    Visit Number  2    Number of Visits  12    Date for PT Re-Evaluation  02/27/19    Authorization Type  UHC MCR/MCD    PT Start Time  1005    PT Stop Time  0938    PT Time Calculation (min)  38 min       Past Medical History:  Diagnosis Date  . Anemia   . Asthma   . Esophagitis   . GERD (gastroesophageal reflux disease)   . HTN (hypertension)   . Hyperplastic colon polyp     Past Surgical History:  Procedure Laterality Date  . ABDOMINAL HYSTERECTOMY  04/12/15  . ANKLE SURGERY Left   . CESAREAN SECTION    . CYSTOSCOPY N/A 04/12/2015   Procedure: CYSTOSCOPY;  Surgeon: Lavonia Drafts, MD;  Location: [facility];  Service: Gynecology;  Laterality: N/A;  . FINGER SURGERY    . TIBIA FRACTURE SURGERY    . TUBAL LIGATION      There were no vitals filed for this visit.  Subjective Assessment - 01/21/19 1005    Subjective  Pt reports 6/10 shoulder and hips. She reports hips most bothersome area.     Currently in Pain?  Yes    Pain Score  6     Pain Location  Shoulder   and bilateral hips   Pain Orientation  --   left neck/left shoulder, righ and left hips   Pain Descriptors / Indicators  Aching;Throbbing   weakness   Pain Type  Chronic pain    Pain Frequency  Constant    Aggravating Factors   constant, lifting, carrying , sleep positions    Pain Relieving Factors  changing positions                        [facility] Adult PT Treatment/Exercise - 01/21/19 0001      Exercises   Exercises  Neck;Shoulder;Knee/Hip      Neck Exercises: Theraband   Rows  20 reps    Rows Limitations  red       Neck Exercises: Standing   Neck Retraction  5 secs;10 reps    Other Standing Exercises  scap retraction x 10     Other Standing Exercises  shoulder rolls       Knee/Hip Exercises: Stretches   Other Knee/Hip Stretches  knee to chest, knee to opp shoulder x 3 each , lower trunk rotations x 10       Knee/Hip Exercises: Aerobic   Nustep  L4 UE/LE x 5 minutes       Knee/Hip Exercises: Supine   Hip Adduction Isometric  15 reps    Bridges  15 reps    Other Supine Knee/Hip Exercises  suipne marching x 20 with abdominal draw in    Other Supine Knee/Hip Exercises  clam green x 20 with abdominal draw in      Shoulder Exercises: Pulleys   Flexion  2 minutes             PT Education - 01/21/19 1039    Education Details  HEP    Person(s)  Educated  Patient    Methods  Explanation;Handout    Comprehension  Verbalized understanding       PT Short Term Goals - 01/15/19 1100      PT SHORT TERM GOAL #1   Title  he will be independent with initial HEP     Time  2    Period  Weeks    Status  New      PT SHORT TERM GOAL #2   Title  She will report hip and neck pain deecreased 10-20% or more    Time  3    Period  Weeks    Status  New      PT SHORT TERM GOAL #3   Title  She will report sleep improved 25% or more     Time  3    Period  Weeks    Status  New        PT Long Term Goals - 01/15/19 1106      PT LONG TERM GOAL #1   Title  She will be independent with all HEP issued.     Time  6    Period  Weeks    Status  New      PT LONG TERM GOAL #2   Title  She will report neck pain as intermittant and decr 75%    Time  6    Period  Weeks    Status  New      PT LONG TERM GOAL #3   Title  She will report able to use LT arm for normal activity with 1-2 max pain    Time  6    Period  Weeks    Status  New      PT LONG TERM GOAL #4   Title  She will walk with decr limp due to decr Lt hip pain.     Time  6    Period  Weeks    Status  New      PT LONG TERM GOAL #5    Title  She will have min tenderness with palpation LT hip to demo decr pain    Time  6    Period  Weeks    Status  New            Plan - 01/21/19 1039    Clinical Impression Statement  Pt reports compliance with Hep, needed cues. Progressed with pelvic mobility exercises with pt following cues well to contract abdominals. Updated HEP with bridge and supine clams. No complaints post session.     PT Next Visit Plan  modalities, manual , review HEP and add as needed.     PT Home Exercise Plan  cervical SB/ROT,  hip flex/adduct, scap and cervical retraction, 5/27- added bridge and supine clam    Consulted and Agree with Plan of Care  Patient       Patient will benefit from skilled therapeutic intervention in order to improve the following deficits and impairments:  Pain, Postural dysfunction, Increased muscle spasms, Decreased activity tolerance, Decreased range of motion, Impaired UE functional use, Difficulty walking  Visit Diagnosis: Chronic left shoulder pain  Pain in left hip  Cervicalgia  Difficulty in walking, not elsewhere classified  Cramp and spasm  Abnormal posture     Problem List Patient Active Problem List   Diagnosis Date Noted  . Headache 09/25/2018  . Hyperlipidemia 06/18/2018  . High risk medication use 02/18/2017  . Neck pain on  left side 02/18/2017  . Abdominal pain 12/24/2016  . Swelling of upper lip 04/03/2016  . HTN (hypertension) 04/03/2016  . Leg swelling 02/15/2016  . Hemangioma of liver 07/07/2015  . Chest pain 05/06/2015  . Uterine fibroid 01/14/2015  . Dysfunctional uterine bleeding 01/14/2015  . Tendinopathy of right rotator cuff 08/24/2014  . Cervical neck pain with evidence of disc disease 05/14/2012  . Osteoarthrosis, unspecified whether generalized or localized, involving lower leg 05/14/2012  . Chronic low back pain 12/20/2010  . Morbid obesity ([place]) 04/07/2008  . ALCOHOL ABUSE 04/07/2008  . TOBACCO ABUSE 04/07/2008  . Iron  deficiency anemia 10/24/2006  . GASTROESOPHAGEAL REFLUX, NO ESOPHAGITIS 10/24/2006    Dorene Ar, PTA 01/21/2019, 10:49 AM  [facility] [address] [place], [place], [address] Phone: [phone]   Fax:  [phone]  Name: Kendra [name] MRN: [id number] Date of Birth: 1966-07-11

## 2019-01-23 ENCOUNTER — Ambulatory Visit: Payer: Medicare Other | Admitting: Family Medicine

## 2019-01-26 ENCOUNTER — Encounter: Payer: Self-pay | Admitting: Family Medicine

## 2019-01-26 ENCOUNTER — Other Ambulatory Visit (HOSPITAL_COMMUNITY)
Admission: RE | Admit: 2019-01-26 | Discharge: 2019-01-26 | Disposition: A | Discharge status: Home / Self Care | Payer: Medicare Other | Source: Ambulatory Visit | Attending: Family Medicine | Admitting: Family Medicine

## 2019-01-26 ENCOUNTER — Ambulatory Visit (INDEPENDENT_AMBULATORY_CARE_PROVIDER_SITE_OTHER): Payer: Medicare Other | Admitting: Family Medicine

## 2019-01-26 ENCOUNTER — Other Ambulatory Visit: Payer: Self-pay

## 2019-01-26 VITALS — BP 104/66 | HR 102 | Wt 271.6 lb

## 2019-01-26 DIAGNOSIS — R51 Headache: Secondary | ICD-10-CM

## 2019-01-26 DIAGNOSIS — Z124 Encounter for screening for malignant neoplasm of cervix: Secondary | ICD-10-CM

## 2019-01-26 DIAGNOSIS — Z202 Contact with and (suspected) exposure to infections with a predominantly sexual mode of transmission: Secondary | ICD-10-CM

## 2019-01-26 DIAGNOSIS — R519 Headache, unspecified: Secondary | ICD-10-CM

## 2019-01-26 MED ORDER — KETOROLAC TROMETHAMINE 30 MG/ML IJ SOLN
30.0000 mg | Freq: Once | INTRAMUSCULAR | Status: AC
  Administered 2019-01-26: 30 mg via INTRAMUSCULAR

## 2019-01-26 NOTE — Progress Notes (Signed)
   CC: PAP , HA  HPI  Reports hx of abnormal pap many years ago. No concerns today.   Today she has a headache. She reports she has had migraines from 8th grade. She uses ibuprofen and one other medicine she can't recall. She states she had a trauma in her past in 8th grade where she fell while roller skating and was "out for 5 mins" and had headaches since. She reports she used to get injections for bad migraines. She says she has had the same headache every day since January. No vision changes. Both sides, stabbing pain. Smokes about 1ppd. She wants to get a picture of her head. She hasn't taken ibuprofen the last few days. Reports this is a typical HA for her.   Does desire G/c testing. Last sex was maybe 2 years ago.   ROS: Denies CP, SOB, abdominal pain, dysuria, changes in BMs.   CC, SH/smoking status, and VS noted  Objective: BP 104/66   Pulse (!) 102   Wt 271 lb 9.6 oz (123.2 kg)   LMP 02/21/2015   SpO2 94%   BMI 46.62 kg/m  Gen: NAD, alert, cooperative, and pleasant. HEENT: NCAT, EOMI, PERRL GU: normal external female genitalia, slightly hypopigmented vaginal mucosa, cervix partially visualized.  Ext: No edema, warm Neuro: Alert and oriented, Speech clear, No gross deficits. CN II-XII grossly intact, gait normal.   Assessment and plan:  PAP performed today.   Pt does not need Hep C retesting as this was done in 05/2018.   Headache: try 47m IV toradol (no recent NSAID use). If no help, consider migraine ppx or headache clinic referral with PCP. Counseled that smoking will likely worsen HA frequency.   Ralene Ok, MD, PGY3 01/26/2019 2:07 PM

## 2019-01-26 NOTE — Patient Instructions (Signed)
Pt declined AVS.

## 2019-01-27 ENCOUNTER — Ambulatory Visit: Payer: Medicare Other | Admitting: Physical Therapy

## 2019-01-27 LAB — CERVICOVAGINAL ANCILLARY ONLY
Chlamydia: NEGATIVE
Neisseria Gonorrhea: NEGATIVE

## 2019-01-28 ENCOUNTER — Telehealth: Payer: Self-pay | Admitting: *Deleted

## 2019-01-28 LAB — CYTOLOGY - PAP: Diagnosis: NEGATIVE

## 2019-01-28 NOTE — Telephone Encounter (Signed)
-----   Message from Sela Hilding, MD sent at 01/28/2019  9:23 AM EDT ----- [facility] team, please let patient know her G/c testing is negative. We are still awaiting her PAP results, which should come today or tomorrow.

## 2019-01-28 NOTE — Telephone Encounter (Signed)
Pt informed of below.Kendra [name], CMA ? ?

## 2019-01-29 ENCOUNTER — Other Ambulatory Visit: Payer: Self-pay

## 2019-01-29 ENCOUNTER — Ambulatory Visit: Payer: Medicare Other

## 2019-01-29 ENCOUNTER — Telehealth: Payer: Medicare Other

## 2019-01-29 ENCOUNTER — Telehealth: Payer: Self-pay | Admitting: *Deleted

## 2019-01-29 ENCOUNTER — Telehealth: Payer: Self-pay | Admitting: Neurology

## 2019-01-29 NOTE — Telephone Encounter (Signed)
Advised patient to contact her primary care provider regarding knots on the back of her calves.

## 2019-01-29 NOTE — Telephone Encounter (Signed)
Pt informed of below.Kendra [name], CMA ? ?

## 2019-01-29 NOTE — Telephone Encounter (Signed)
-----   Message from Sela Hilding, MD sent at 01/29/2019  3:51 PM EDT ----- Please let patient know her pap smear was normal.

## 2019-01-29 NOTE — Telephone Encounter (Signed)
Pt states that she has two big knots in the back of her left calf and they are painful, she states that her legs still get numbed. She would like some advise.

## 2019-01-30 ENCOUNTER — Ambulatory Visit (INDEPENDENT_AMBULATORY_CARE_PROVIDER_SITE_OTHER): Payer: Medicare Other | Admitting: Family Medicine

## 2019-01-30 ENCOUNTER — Other Ambulatory Visit: Payer: Self-pay

## 2019-01-30 VITALS — BP 110/78 | HR 70

## 2019-01-30 DIAGNOSIS — R2243 Localized swelling, mass and lump, lower limb, bilateral: Secondary | ICD-10-CM | POA: Insufficient documentation

## 2019-01-30 NOTE — Patient Instructions (Signed)
It was nice meeting you today Ms. Dipinto!  I would like to get an ultrasound of the backs of both of your legs.  This will help [place] determine what type of swelling is going on there.  The good news is that this is unlikely to be anything that will be dangerous to you.  We will follow up with you about these results once they come back.  If you have any questions or concerns, please feel free to call the clinic.   Be well,  Dr. Shan Levans

## 2019-01-30 NOTE — Progress Notes (Signed)
   Subjective:    Kendra [name] - 53 y.o. female MRN [id number]  Date of birth: Oct 27, 1965  CC:  Kendra [name] is here for lesions on her bilateral legs.  HPI:  Patient reports that she noticed areas that were swollen on the back of her left leg on Wednesday, June 3.  She says they are slightly sore but not very painful.  This is never occurred before.  She denies any constitutional symptoms, including fever, chills, stomach pain, or congestion.  She does say that she has slight swelling in her legs, which is chronic, and she often has burning and stinging in her legs, but she is unsure if this is related to the areas that she first noticed on June 3.  She denies any erythema in the area.  Health Maintenance:  Health Maintenance Due  Topic Date Due  . TETANUS/TDAP  02/25/2016    -  reports that she has been smoking cigarettes. She started smoking about 16 years ago. She has been smoking about 1.50 packs per day. She has never used smokeless tobacco. - Review of Systems: Per HPI. - Past Medical History: Patient Active Problem List   Diagnosis Date Noted  . Subcutaneous mass of both lower extremities 01/30/2019  . Headache 09/25/2018  . Hyperlipidemia 06/18/2018  . High risk medication use 02/18/2017  . Neck pain on left side 02/18/2017  . HTN (hypertension) 04/03/2016  . Leg swelling 02/15/2016  . Hemangioma of liver 07/07/2015  . Uterine fibroid 01/14/2015  . Dysfunctional uterine bleeding 01/14/2015  . Tendinopathy of right rotator cuff 08/24/2014  . Cervical neck pain with evidence of disc disease 05/14/2012  . Osteoarthrosis, unspecified whether generalized or localized, involving lower leg 05/14/2012  . Chronic low back pain 12/20/2010  . Morbid obesity ([place]) 04/07/2008  . ALCOHOL ABUSE 04/07/2008  . TOBACCO ABUSE 04/07/2008  . Iron deficiency anemia 10/24/2006  . GASTROESOPHAGEAL REFLUX, NO ESOPHAGITIS 10/24/2006   - Medications: reviewed and updated   Objective:   Physical Exam BP 110/78   Pulse 70   LMP 02/21/2015   SpO2 98%  Gen: NAD, alert, cooperative with exam, well-appearing, obese CV: RRR, good S1/S2, no murmur, trace bilateral pedal edema, capillary refill brisk  Resp: CTABL, no wheezes, non-labored Skin: Nonerythematous, soft subcutaneous lesions on the calves of both legs, left more than right.  Mildly tender to palpation.  Compressible.  No discoloration noted.  Neurovascularly intact.     Assessment & Plan:   Subcutaneous mass of both lower extremities Differential includes but is not limited to venous insufficiency, varicose veins, lipomas.  Concern for DVT is very low given bilateral appearance and lack of surrounding generalized swelling.  Will obtain ultrasound of the soft tissue of the posterior lower legs to better characterize these areas.  Patient was reassured that these areas are very unlikely to a dangerous medical condition.  We will let patient know what her ultrasound results are when they return.    Maia Breslow, M.D. 01/30/2019, 10:50 AM PGY-2, [place]

## 2019-01-30 NOTE — Assessment & Plan Note (Signed)
Differential includes but is not limited to venous insufficiency, varicose veins, lipomas.  Concern for DVT is very low given bilateral appearance and lack of surrounding generalized swelling.  Will obtain ultrasound of the soft tissue of the posterior lower legs to better characterize these areas.  Patient was reassured that these areas are very unlikely to a dangerous medical condition.  We will let patient know what her ultrasound results are when they return.

## 2019-02-03 ENCOUNTER — Ambulatory Visit: Payer: Medicare Other | Attending: Sports Medicine

## 2019-02-03 ENCOUNTER — Other Ambulatory Visit: Payer: Self-pay

## 2019-02-03 DIAGNOSIS — R262 Difficulty in walking, not elsewhere classified: Secondary | ICD-10-CM | POA: Diagnosis not present

## 2019-02-03 DIAGNOSIS — G8929 Other chronic pain: Secondary | ICD-10-CM | POA: Insufficient documentation

## 2019-02-03 DIAGNOSIS — M25552 Pain in left hip: Secondary | ICD-10-CM | POA: Insufficient documentation

## 2019-02-03 DIAGNOSIS — M25551 Pain in right hip: Secondary | ICD-10-CM | POA: Insufficient documentation

## 2019-02-03 DIAGNOSIS — M542 Cervicalgia: Secondary | ICD-10-CM | POA: Insufficient documentation

## 2019-02-03 DIAGNOSIS — M25512 Pain in left shoulder: Secondary | ICD-10-CM | POA: Diagnosis not present

## 2019-02-03 NOTE — Therapy (Signed)
[place], [place], [address] Phone: [phone]   Fax:  [phone]  Physical Therapy Treatment  Patient Details  Name: Kendra [name] MRN: [id number] Date of Birth: [birth year] Referring Provider (PT): Lilia Argue, DO   Encounter Date: 02/03/2019  PT End of Session - 02/03/19 1013    Visit Number  3    Number of Visits  12    Date for PT Re-Evaluation  02/27/19    Authorization Type  UHC MCR/MCD    PT Start Time  1012    PT Stop Time  1120    PT Time Calculation (min)  68 min    Activity Tolerance  Patient tolerated treatment well    Behavior During Therapy  [facility] for tasks assessed/performed       Past Medical History:  Diagnosis Date  . Anemia   . Asthma   . Esophagitis   . GERD (gastroesophageal reflux disease)   . HTN (hypertension)   . Hyperplastic colon polyp     Past Surgical History:  Procedure Laterality Date  . ABDOMINAL HYSTERECTOMY  04/12/15  . ANKLE SURGERY Left   . CESAREAN SECTION    . CYSTOSCOPY N/A 04/12/2015   Procedure: CYSTOSCOPY;  Surgeon: Lavonia Drafts, MD;  Location: [facility];  Service: Gynecology;  Laterality: N/A;  . FINGER SURGERY    . TIBIA FRACTURE SURGERY    . TUBAL LIGATION      There were no vitals filed for this visit.  Subjective Assessment - 02/03/19 1016    Subjective  She is about the same. Does HEP 2x/day.     Pain Location  Neck    Pain Orientation  Left    Pain Descriptors / Indicators  Aching;Throbbing    Pain Type  Chronic pain    Pain Onset  More than a month ago    Pain Frequency  Constant    Aggravating Factors   acvtivity with UE, s;leep    Pain Relieving Factors  change position    Multiple Pain Sites  Yes    Pain Location  Hip    Pain Orientation  Right;Left   LT worse   Pain Descriptors / Indicators  Aching;Throbbing    Pain Type  Chronic pain    Pain Onset  More than a month ago    Pain Frequency  Constant    Aggravating  Factors   activity on feet.     Pain Relieving Factors  Nothing                       [facility] Adult PT Treatment/Exercise - 02/03/19 0001      Neck Exercises: Theraband   Shoulder Extension  10 reps    Shoulder Extension Limitations  red band    Rows  20 reps    Rows Limitations  red     Shoulder External Rotation  10 reps;Red    Other Theraband Exercises  Above done in supine      Neck Exercises: Standing   Neck Retraction  5 secs;10 reps      Neck Exercises: Seated   Other Seated Exercise  neck and shoulder ROM and worked with cerical retraction      Knee/Hip Exercises: Aerobic   Nustep  L4 UE/LE x 5 minutes       Shoulder Exercises: Stretch   Cross Chest Stretch  2 reps;30 seconds    Cross Chest Stretch Limitations  LT  shoulder       Modalities   Modalities  Ultrasound;Traction;Moist Heat      Moist Heat Therapy   Number Minutes Moist Heat  10 Minutes    Moist Heat Location  Cervical   and to shoulder and hips     Manual Therapy   Manual Therapy  Soft tissue mobilization;Joint mobilization;Passive ROM;Manual Traction    Joint Mobilization  PA cefrvical glides  into upper Thoracic spine.     Soft tissue mobilization  Lt neck and shoulder    Passive ROM  gentle rotation and side bend all full rom and smooth    Manual Traction  in supine gentle distraction             PT Education - 02/03/19 1117    Education Details  HEP    Person(s) Educated  Patient    Methods  Explanation;Demonstration;Tactile cues;Verbal cues;Handout    Comprehension  Returned demonstration;Verbalized understanding       PT Short Term Goals - 02/03/19 1059      PT SHORT TERM GOAL #1   Title  she will be independent with initial HEP     Baseline  minor cues to go full ROM in neck    Status  Achieved      PT SHORT TERM GOAL #2   Title  She will report hip and neck pain deecreased 10-20% or more    Status  On-going      PT SHORT TERM GOAL #3   Title  She will  report sleep improved 25% or more     Status  On-going        PT Long Term Goals - 01/15/19 1106      PT LONG TERM GOAL #1   Title  She will be independent with all HEP issued.     Time  6    Period  Weeks    Status  New      PT LONG TERM GOAL #2   Title  She will report neck pain as intermittant and decr 75%    Time  6    Period  Weeks    Status  New      PT LONG TERM GOAL #3   Title  She will report able to use LT arm for normal activity with 1-2 max pain    Time  6    Period  Weeks    Status  New      PT LONG TERM GOAL #4   Title  She will walk with decr limp due to decr Lt hip pain.     Time  6    Period  Weeks    Status  New      PT LONG TERM GOAL #5   Title  She will have min tenderness with palpation LT hip to demo decr pain    Time  6    Period  Weeks    Status  New            Plan - 02/03/19 1014    Clinical Impression Statement  No improvement. Cued for correct exercise and she was able to doe this immediately.   I don't expect much improvement but need to try.  Less pain post session . Neck soft tissue with TP but generally supple.   Will issue some hip exercises for strength/ stretch    PT Treatment/Interventions  Moist Heat;Iontophoresis 4mg /ml Dexamethasone;Electrical Stimulation;Ultrasound;Stair training;Gait training;Therapeutic exercise;Manual techniques;Dry needling;Passive range of  motion;Taping;Patient/family education    PT Next Visit Plan  modalities  manual,l , review HEP add as needed.  more attnesion to hip next time    PT Home Exercise Plan  cervical SB/ROT,  hip flex/adduct, scap and cervical retraction, 5/27- added bridge and supine clam     red band ER /hor abduction / extension    Consulted and Agree with Plan of Care  Patient       Patient will benefit from skilled therapeutic intervention in order to improve the following deficits and impairments:  Pain, Postural dysfunction, Increased muscle spasms, Decreased activity tolerance,  Decreased range of motion, Impaired UE functional use, Difficulty walking  Visit Diagnosis: Chronic left shoulder pain  Pain in left hip  Cervicalgia     Problem List Patient Active Problem List   Diagnosis Date Noted  . Subcutaneous mass of both lower extremities 01/30/2019  . Headache 09/25/2018  . Hyperlipidemia 06/18/2018  . High risk medication use 02/18/2017  . Neck pain on left side 02/18/2017  . HTN (hypertension) 04/03/2016  . Leg swelling 02/15/2016  . Hemangioma of liver 07/07/2015  . Uterine fibroid 01/14/2015  . Dysfunctional uterine bleeding 01/14/2015  . Tendinopathy of right rotator cuff 08/24/2014  . Cervical neck pain with evidence of disc disease 05/14/2012  . Osteoarthrosis, unspecified whether generalized or localized, involving lower leg 05/14/2012  . Chronic low back pain 12/20/2010  . Morbid obesity ([place]) 04/07/2008  . ALCOHOL ABUSE 04/07/2008  . TOBACCO ABUSE 04/07/2008  . Iron deficiency anemia 10/24/2006  . GASTROESOPHAGEAL REFLUX, NO ESOPHAGITIS 10/24/2006    Darrel Hoover  PT 02/03/2019, 11:19 AM  [facility] [address] [place], [place], [address] Phone: [phone]   Fax:  [phone]  Name: Kendra [name] MRN: [id number] Date of Birth: [birth year]

## 2019-02-03 NOTE — Patient Instructions (Signed)
Rhomboid stretch 6-8x/day 10-30 sec as abel Lt shoulder ,  Unattached scapula thera band ER/Hor Abduction/ ext  Red band x 10-15 reps daily

## 2019-02-05 ENCOUNTER — Ambulatory Visit: Payer: Medicare Other

## 2019-02-05 ENCOUNTER — Other Ambulatory Visit: Payer: Self-pay

## 2019-02-05 DIAGNOSIS — M25552 Pain in left hip: Secondary | ICD-10-CM

## 2019-02-05 DIAGNOSIS — M542 Cervicalgia: Secondary | ICD-10-CM | POA: Diagnosis not present

## 2019-02-05 DIAGNOSIS — G8929 Other chronic pain: Secondary | ICD-10-CM

## 2019-02-05 DIAGNOSIS — R262 Difficulty in walking, not elsewhere classified: Secondary | ICD-10-CM | POA: Diagnosis not present

## 2019-02-05 DIAGNOSIS — M25551 Pain in right hip: Secondary | ICD-10-CM | POA: Diagnosis not present

## 2019-02-05 DIAGNOSIS — M25512 Pain in left shoulder: Secondary | ICD-10-CM

## 2019-02-05 NOTE — Patient Instructions (Signed)
EXTERNAL ROTATION: Sitting (Active)    Sit, feet flat. Lift right knee and move foot inward toward opposite knee. Use ___ lbs. Complete ___ sets of ___ repetitions. Perform ___ sessions per day.  Copyright  VHI. All rights reserved.  INTERNAL ROTATION: Sitting (Active)    Sit, feet flat. Lift right leg slightly and move foot outward. Use ___ lbs. Complete ___ sets of ___ repetitions. Perform ___ sessions per day.  Copyright  VHI. All rights reserved.  Hip Adduction    Squeeze knees together, spread them apart and bring them back together. Work within the controllable range, even if only a few inches. Repeat ____ times. Do ____ sessions per day.        CAN USE THE BAND WHEN YOU PULL   AND PILLOW WHEN YOU SQUEEZE  http://gt2.exer.us/714   Copyright  VHI. All rights reserved.  Long [employer]    Tighten muscle on front of thigh and extend leg. Hold straight for ____ seconds. Curl leg back as far as possible and hold for ____ seconds. Repeat ____ times. Do ____ sessions per day.  Copyright  VHI. All rights reserved.  Marching    March in place for ___. Do ___ times per day.  Copyright  VHI. All rights reserved.   ALL EXERCISES 15-20 REPS   1-2X/DAY RT AND LT LEG

## 2019-02-05 NOTE — Therapy (Signed)
[place], [place], [address] Phone: [phone]   Fax:  [phone]  Physical Therapy Treatment  Patient Details  Name: Kendra [name] MRN: [id number] Date of Birth: April 11, 1966 Referring Provider (PT): Lilia Argue, DO   Encounter Date: 02/05/2019  PT End of Session - 02/05/19 1010    Visit Number  4    Number of Visits  12    Date for PT Re-Evaluation  02/27/19    Authorization Type  UHC MCR/MCD    PT Start Time  1010   Late 8 min   PT Stop Time  1105    PT Time Calculation (min)  55 min    Activity Tolerance  Patient tolerated treatment well    Behavior During Therapy  [facility] for tasks assessed/performed       Past Medical History:  Diagnosis Date  . Anemia   . Asthma   . Esophagitis   . GERD (gastroesophageal reflux disease)   . HTN (hypertension)   . Hyperplastic colon polyp     Past Surgical History:  Procedure Laterality Date  . ABDOMINAL HYSTERECTOMY  04/12/15  . ANKLE SURGERY Left   . CESAREAN SECTION    . CYSTOSCOPY N/A 04/12/2015   Procedure: CYSTOSCOPY;  Surgeon: Lavonia Drafts, MD;  Location: [facility];  Service: Gynecology;  Laterality: N/A;  . FINGER SURGERY    . TIBIA FRACTURE SURGERY    . TUBAL LIGATION      There were no vitals filed for this visit.  Subjective Assessment - 02/05/19 1011    Subjective  She reports pain at 5 LT neck and shoulder.    Pain Score  5     Pain Location  Neck    Pain Orientation  Left                       [facility] Adult PT Treatment/Exercise - 02/05/19 0001      Neck Exercises: Seated   Other Seated Exercise  neck and shoulder ROM (levator and upper trap)and worked with cerical retraction      Knee/Hip Exercises: Seated   Long Arc Quad  Right;Left;15 reps    Clamshell with TheraBand  --   15 reps   Other Seated Knee/Hip Exercises  IR /ER x 15     Abduction/Adduction   20 reps;Both    Abd/Adduction Limitations  ball  squeeze and clam red band       Moist Heat Therapy   Number Minutes Moist Heat  10 Minutes    Moist Heat Location  Cervical      Ultrasound   Ultrasound Location  Lt neck     Ultrasound Parameters  100% 1MHz 1.6 Wcm2    Ultrasound Goals  Pain      Manual Therapy   Joint Mobilization  PA cefrvical glides  into upper Thoracic spine.     Soft tissue mobilization  Lt neck and shoulder    Passive ROM  gentle rotation and side bend all full rom and smooth      Neck Exercises: Stretches   Upper Trapezius Stretch  Right;Left;20 seconds    Levator Stretch  Left;20 seconds             PT Education - 02/05/19 1055    Education Details  HEP    Person(s) Educated  Patient    Methods  Explanation;Tactile cues;Verbal cues;Handout    Comprehension  Returned demonstration;Verbalized understanding  PT Short Term Goals - 02/03/19 1059      PT SHORT TERM GOAL #1   Title  she will be independent with initial HEP     Baseline  minor cues to go full ROM in neck    Status  Achieved      PT SHORT TERM GOAL #2   Title  She will report hip and neck pain deecreased 10-20% or more    Status  On-going      PT SHORT TERM GOAL #3   Title  She will report sleep improved 25% or more     Status  On-going        PT Long Term Goals - 01/15/19 1106      PT LONG TERM GOAL #1   Title  She will be independent with all HEP issued.     Time  6    Period  Weeks    Status  New      PT LONG TERM GOAL #2   Title  She will report neck pain as intermittant and decr 75%    Time  6    Period  Weeks    Status  New      PT LONG TERM GOAL #3   Title  She will report able to use LT arm for normal activity with 1-2 max pain    Time  6    Period  Weeks    Status  New      PT LONG TERM GOAL #4   Title  She will walk with decr limp due to decr Lt hip pain.     Time  6    Period  Weeks    Status  New      PT LONG TERM GOAL #5   Title  She will have min tenderness with palpation LT hip to  demo decr pain    Time  6    Period  Weeks    Status  New            Plan - 02/05/19 1105    Clinical Impression Statement  She reports mild decr pain in neck not in hips. She was able to do LE exercises  without increased pain. She reported less pain post neck treatment. Continued full neck ROm    PT Treatment/Interventions  Moist Heat;Iontophoresis 4mg /ml Dexamethasone;Electrical Stimulation;Ultrasound;Stair training;Gait training;Therapeutic exercise;Manual techniques;Dry needling;Passive range of motion;Taping;Patient/family education    PT Home Exercise Plan  cervical SB/ROT,  hip flex/adduct, scap and cervical retraction, 5/27- added bridge and supine clam     red band ER /hor abduction / extension,active LAQ  hip ER/IR,  marching,  abduction /adduction( pillow and red band)    Consulted and Agree with Plan of Care  Patient       Patient will benefit from skilled therapeutic intervention in order to improve the following deficits and impairments:  Pain, Postural dysfunction, Increased muscle spasms, Decreased activity tolerance, Decreased range of motion, Impaired UE functional use, Difficulty walking  Visit Diagnosis: Pain in left hip  Cervicalgia  Chronic left shoulder pain     Problem List Patient Active Problem List   Diagnosis Date Noted  . Subcutaneous mass of both lower extremities 01/30/2019  . Headache 09/25/2018  . Hyperlipidemia 06/18/2018  . High risk medication use 02/18/2017  . Neck pain on left side 02/18/2017  . HTN (hypertension) 04/03/2016  . Leg swelling 02/15/2016  . Hemangioma of liver 07/07/2015  . Uterine fibroid 01/14/2015  .  Dysfunctional uterine bleeding 01/14/2015  . Tendinopathy of right rotator cuff 08/24/2014  . Cervical neck pain with evidence of disc disease 05/14/2012  . Osteoarthrosis, unspecified whether generalized or localized, involving lower leg 05/14/2012  . Chronic low back pain 12/20/2010  . Morbid obesity ([place])  04/07/2008  . ALCOHOL ABUSE 04/07/2008  . TOBACCO ABUSE 04/07/2008  . Iron deficiency anemia 10/24/2006  . GASTROESOPHAGEAL REFLUX, NO ESOPHAGITIS 10/24/2006    Darrel Hoover  PT 02/05/2019, 11:10 AM  [facility] [address] [place], [place], [address] Phone: [phone]   Fax:  [phone]  Name: ARGIE LOBER MRN: [id number] Date of Birth: Nov 29, 1965

## 2019-02-10 ENCOUNTER — Ambulatory Visit
Admission: RE | Admit: 2019-02-10 | Discharge: 2019-02-10 | Disposition: A | Discharge status: Home / Self Care | Payer: Medicare Other | Source: Ambulatory Visit | Attending: Family Medicine | Admitting: Family Medicine

## 2019-02-10 ENCOUNTER — Other Ambulatory Visit: Payer: Self-pay

## 2019-02-10 ENCOUNTER — Ambulatory Visit: Payer: Medicare Other

## 2019-02-10 DIAGNOSIS — M25552 Pain in left hip: Secondary | ICD-10-CM

## 2019-02-10 DIAGNOSIS — M25551 Pain in right hip: Secondary | ICD-10-CM

## 2019-02-10 DIAGNOSIS — R2243 Localized swelling, mass and lump, lower limb, bilateral: Secondary | ICD-10-CM

## 2019-02-10 DIAGNOSIS — G8929 Other chronic pain: Secondary | ICD-10-CM | POA: Diagnosis not present

## 2019-02-10 DIAGNOSIS — R2241 Localized swelling, mass and lump, right lower limb: Secondary | ICD-10-CM | POA: Diagnosis not present

## 2019-02-10 DIAGNOSIS — R262 Difficulty in walking, not elsewhere classified: Secondary | ICD-10-CM

## 2019-02-10 DIAGNOSIS — M542 Cervicalgia: Secondary | ICD-10-CM

## 2019-02-10 DIAGNOSIS — M25512 Pain in left shoulder: Secondary | ICD-10-CM

## 2019-02-10 DIAGNOSIS — R2242 Localized swelling, mass and lump, left lower limb: Secondary | ICD-10-CM | POA: Diagnosis not present

## 2019-02-10 NOTE — Therapy (Signed)
[place], [place], [address] Phone: [phone]   Fax:  [phone]  Physical Therapy Treatment  Patient Details  Name: [name] [name] MRN: [id number] Date of Birth: 1965/12/13 Referring Provider (PT): Lilia Argue, DO   Encounter Date: 02/10/2019  PT End of Session - 02/10/19 1008    Visit Number  5    Number of Visits  12    Date for PT Re-Evaluation  02/27/19    Authorization Type  UHC MCR/MCD    PT Start Time  1010   She had o go to imaging for a 11AM appointment so left early   PT Stop Time  1040    PT Time Calculation (min)  30 min    Activity Tolerance  Patient tolerated treatment well    Behavior During Therapy  [facility] for tasks assessed/performed       Past Medical History:  Diagnosis Date  . Anemia   . Asthma   . Esophagitis   . GERD (gastroesophageal reflux disease)   . HTN (hypertension)   . Hyperplastic colon polyp     Past Surgical History:  Procedure Laterality Date  . ABDOMINAL HYSTERECTOMY  04/12/15  . ANKLE SURGERY Left   . CESAREAN SECTION    . CYSTOSCOPY N/A 04/12/2015   Procedure: CYSTOSCOPY;  Surgeon: Lavonia Drafts, MD;  Location: [facility];  Service: Gynecology;  Laterality: N/A;  . FINGER SURGERY    . TIBIA FRACTURE SURGERY    . TUBAL LIGATION      There were no vitals filed for this visit.  Subjective Assessment - 02/10/19 1010    Subjective  She reports nceck some better 4/10.  Hips no better.   She reports doing HEP    Pain Score  4     Pain Location  Neck    Pain Orientation  Left    Pain Descriptors / Indicators  Aching;Throbbing    Pain Type  Chronic pain    Pain Onset  More than a month ago    Pain Frequency  Constant                       [facility] Adult PT Treatment/Exercise - 02/10/19 0001      Knee/Hip Exercises: Stretches   Passive Hamstring Stretch  Right;Left;1 rep;30 seconds    Piriformis Stretch  Right;Left;1 rep;30  seconds    Other Knee/Hip Stretches  figure 4 x 30 sec RT/LT ,        Knee/Hip Exercises: Supine   Bridges  20 reps    Other Supine Knee/Hip Exercises  clam shell blue band x 30, ball squeeze x 20      Manual Therapy   Soft tissue mobilization  with roller to hip and back     Passive ROM  trunk rotation x 60 sec RT/LT     Manual Traction  each long axis pull of leg               PT Short Term Goals - 02/03/19 1059      PT SHORT TERM GOAL #1   Title  she will be independent with initial HEP     Baseline  minor cues to go full ROM in neck    Status  Achieved      PT SHORT TERM GOAL #2   Title  She will report hip and neck pain deecreased 10-20% or more    Status  On-going  PT SHORT TERM GOAL #3   Title  She will report sleep improved 25% or more     Status  On-going        PT Long Term Goals - 01/15/19 1106      PT LONG TERM GOAL #1   Title  She will be independent with all HEP issued.     Time  6    Period  Weeks    Status  New      PT LONG TERM GOAL #2   Title  She will report neck pain as intermittant and decr 75%    Time  6    Period  Weeks    Status  New      PT LONG TERM GOAL #3   Title  She will report able to use LT arm for normal activity with 1-2 max pain    Time  6    Period  Weeks    Status  New      PT LONG TERM GOAL #4   Title  She will walk with decr limp due to decr Lt hip pain.     Time  6    Period  Weeks    Status  New      PT LONG TERM GOAL #5   Title  She will have min tenderness with palpation LT hip to demo decr pain    Time  6    Period  Weeks    Status  New            Plan - 02/10/19 1008    Clinical Impression Statement  Used today to work on hips and low back. Her hip ROM is excellat and same for trunk rotation. It appears her gluteal pain are back related as she was significantly tender in lower lumbar spine.  she reported she felt better post session.  Will continue manual for hips/back and issue tennis  ball for home for STW    PT Treatment/Interventions  Moist Heat;Iontophoresis 4mg /ml Dexamethasone;Electrical Stimulation;Ultrasound;Stair training;Gait training;Therapeutic exercise;Manual techniques;Dry needling;Passive range of motion;Taping;Patient/family education    PT Next Visit Plan  modalities  manual,l , review HEP add as needed.  more attnesion to hip next time  tennis ball for STW    PT Home Exercise Plan  cervical SB/ROT,  hip flex/adduct, scap and cervical retraction, 5/27- added bridge and supine clam     red band ER /hor abduction / extension,active LAQ  hip ER/IR,  marching,  abduction /adduction( pillow and red band)    Consulted and Agree with Plan of Care  Patient       Patient will benefit from skilled therapeutic intervention in order to improve the following deficits and impairments:  Pain, Postural dysfunction, Increased muscle spasms, Decreased activity tolerance, Decreased range of motion, Impaired UE functional use, Difficulty walking  Visit Diagnosis: 1. Pain in left hip   2. Cervicalgia   3. Chronic left shoulder pain   4. Difficulty in walking, not elsewhere classified        Problem List Patient Active Problem List   Diagnosis Date Noted  . Subcutaneous mass of both lower extremities 01/30/2019  . Headache 09/25/2018  . Hyperlipidemia 06/18/2018  . High risk medication use 02/18/2017  . Neck pain on left side 02/18/2017  . HTN (hypertension) 04/03/2016  . Leg swelling 02/15/2016  . Hemangioma of liver 07/07/2015  . Uterine fibroid 01/14/2015  . Dysfunctional uterine bleeding 01/14/2015  . Tendinopathy of right rotator  cuff 08/24/2014  . Cervical neck pain with evidence of disc disease 05/14/2012  . Osteoarthrosis, unspecified whether generalized or localized, involving lower leg 05/14/2012  . Chronic low back pain 12/20/2010  . Morbid obesity ([place]) 04/07/2008  . ALCOHOL ABUSE 04/07/2008  . TOBACCO ABUSE 04/07/2008  . Iron deficiency anemia  10/24/2006  . GASTROESOPHAGEAL REFLUX, NO ESOPHAGITIS 10/24/2006    Darrel Hoover PT 02/10/2019, 10:51 AM  [facility] [address] [place], [place], [address] Phone: [phone]   Fax:  [phone]  Name: ASLIN FARINAS MRN: [id number] Date of Birth: Mar 24, 1966

## 2019-02-12 ENCOUNTER — Other Ambulatory Visit: Payer: Self-pay

## 2019-02-12 ENCOUNTER — Ambulatory Visit: Payer: Medicare Other

## 2019-02-12 DIAGNOSIS — R262 Difficulty in walking, not elsewhere classified: Secondary | ICD-10-CM | POA: Diagnosis not present

## 2019-02-12 DIAGNOSIS — M542 Cervicalgia: Secondary | ICD-10-CM

## 2019-02-12 DIAGNOSIS — M25512 Pain in left shoulder: Secondary | ICD-10-CM | POA: Diagnosis not present

## 2019-02-12 DIAGNOSIS — G8929 Other chronic pain: Secondary | ICD-10-CM

## 2019-02-12 DIAGNOSIS — M25552 Pain in left hip: Secondary | ICD-10-CM

## 2019-02-12 DIAGNOSIS — M25551 Pain in right hip: Secondary | ICD-10-CM | POA: Diagnosis not present

## 2019-02-12 NOTE — Therapy (Signed)
[place], [place], [address] Phone: [phone]   Fax:  [phone]  Physical Therapy Treatment  Patient Details  Name: Kendra [name] MRN: [id number] Date of Birth: May 11, 1966 Referring Provider (PT): Lilia Argue, DO   Encounter Date: 02/12/2019  PT End of Session - 02/12/19 1001    Visit Number  6    Number of Visits  12    Date for PT Re-Evaluation  02/27/19    Authorization Type  UHC MCR/MCD    PT Start Time  1000    PT Stop Time  1100    PT Time Calculation (min)  60 min    Activity Tolerance  Patient tolerated treatment well    Behavior During Therapy  [facility] for tasks assessed/performed       Past Medical History:  Diagnosis Date  . Anemia   . Asthma   . Esophagitis   . GERD (gastroesophageal reflux disease)   . HTN (hypertension)   . Hyperplastic colon polyp     Past Surgical History:  Procedure Laterality Date  . ABDOMINAL HYSTERECTOMY  04/12/15  . ANKLE SURGERY Left   . CESAREAN SECTION    . CYSTOSCOPY N/A 04/12/2015   Procedure: CYSTOSCOPY;  Surgeon: Lavonia Drafts, MD;  Location: [facility];  Service: Gynecology;  Laterality: N/A;  . FINGER SURGERY    . TIBIA FRACTURE SURGERY    . TUBAL LIGATION      There were no vitals filed for this visit.  Subjective Assessment - 02/12/19 1011    Subjective  Some better    Pain Score  4     Pain Location  Neck    Pain Orientation  Left    Pain Descriptors / Indicators  Aching    Pain Type  Chronic pain    Pain Onset  More than a month ago    Pain Frequency  Constant    Aggravating Factors   using arm    Pain Score  3    Pain Location  Hip    Pain Orientation  Left;Right    Pain Descriptors / Indicators  Aching;Throbbing    Pain Type  Chronic pain    Pain Onset  More than a month ago    Pain Frequency  Constant    Aggravating Factors   activity on feet    Pain Relieving Factors  nothing                        [facility] Adult PT Treatment/Exercise - 02/12/19 0001      Self-Care   Self-Care  Other Self-Care Comments    Other Self-Care Comments   STW with tennis ball RT and LT hip. Theracane  for neck       Knee/Hip Exercises: Aerobic   Nustep  L4 UE/LE x 5 minutes       Knee/Hip Exercises: Supine   Bridges  20 reps    Bridges Limitations  with ball and blue band as below    Other Supine Knee/Hip Exercises  clam shell blue band x 30, ball squeeze x 20    Other Supine Knee/Hip Exercises  Marching x 15 RT/LT       Knee/Hip Exercises: Sidelying   Clams  x15 RT/LT  and reverses clam      Moist Heat Therapy   Number Minutes Moist Heat  10 Minutes    Moist Heat Location  Cervical;Lumbar Spine  Manual Therapy   Soft tissue mobilization  with roller to hip and back     Passive ROM  trunk rotation x 60 sec RT/LT     Manual Traction  each long axis pull of leg             PT Education - 02/12/19 1054    Education Details  use of tennis ball (issued) and theracane    Person(s) Educated  Patient    Methods  Explanation;Demonstration    Comprehension  Verbalized understanding;Returned demonstration       PT Short Term Goals - 02/03/19 1059      PT SHORT TERM GOAL #1   Title  she will be independent with initial HEP     Baseline  minor cues to go full ROM in neck    Status  Achieved      PT SHORT TERM GOAL #2   Title  She will report hip and neck pain deecreased 10-20% or more    Status  On-going      PT SHORT TERM GOAL #3   Title  She will report sleep improved 25% or more     Status  On-going        PT Long Term Goals - 01/15/19 1106      PT LONG TERM GOAL #1   Title  She will be independent with all HEP issued.     Time  6    Period  Weeks    Status  New      PT LONG TERM GOAL #2   Title  She will report neck pain as intermittant and decr 75%    Time  6    Period  Weeks    Status  New      PT LONG TERM GOAL #3   Title  She  will report able to use LT arm for normal activity with 1-2 max pain    Time  6    Period  Weeks    Status  New      PT LONG TERM GOAL #4   Title  She will walk with decr limp due to decr Lt hip pain.     Time  6    Period  Weeks    Status  New      PT LONG TERM GOAL #5   Title  She will have min tenderness with palpation LT hip to demo decr pain    Time  6    Period  Weeks    Status  New            Plan - 02/12/19 1002    Clinical Impression Statement  Will address neck next session. She is some less tender in hips  thigh most tender lower lumbar near SI.  She is able to do all exercises correctly after minor cues for technique. she reports decr paoin but reports similar numbers in last 2-3 sessions.    PT Treatment/Interventions  Moist Heat;Iontophoresis 4mg /ml Dexamethasone;Electrical Stimulation;Ultrasound;Stair training;Gait training;Therapeutic exercise;Manual techniques;Dry needling;Passive range of motion;Taping;Patient/family education    PT Home Exercise Plan  cervical SB/ROT,  hip flex/adduct, scap and cervical retraction, 5/27- added bridge and supine clam     red band ER /hor abduction / extension,active LAQ  hip ER/IR,  marching,  abduction /adduction( pillow and red band)    Consulted and Agree with Plan of Care  Patient       Patient will benefit from skilled therapeutic intervention  in order to improve the following deficits and impairments:  Pain, Postural dysfunction, Increased muscle spasms, Decreased activity tolerance, Decreased range of motion, Impaired UE functional use, Difficulty walking  Visit Diagnosis: 1. Cervicalgia   2. Chronic left shoulder pain   3. Pain in left hip        Problem List Patient Active Problem List   Diagnosis Date Noted  . Subcutaneous mass of both lower extremities 01/30/2019  . Headache 09/25/2018  . Hyperlipidemia 06/18/2018  . High risk medication use 02/18/2017  . Neck pain on left side 02/18/2017  . HTN  (hypertension) 04/03/2016  . Leg swelling 02/15/2016  . Hemangioma of liver 07/07/2015  . Uterine fibroid 01/14/2015  . Dysfunctional uterine bleeding 01/14/2015  . Tendinopathy of right rotator cuff 08/24/2014  . Cervical neck pain with evidence of disc disease 05/14/2012  . Osteoarthrosis, unspecified whether generalized or localized, involving lower leg 05/14/2012  . Chronic low back pain 12/20/2010  . Morbid obesity ([place]) 04/07/2008  . ALCOHOL ABUSE 04/07/2008  . TOBACCO ABUSE 04/07/2008  . Iron deficiency anemia 10/24/2006  . GASTROESOPHAGEAL REFLUX, NO ESOPHAGITIS 10/24/2006    Darrel Hoover  PT 02/12/2019, 10:55 AM  [facility] [address] [place], [place], [address] Phone: [phone]   Fax:  [phone]  Name: Kendra [name] MRN: [id number] Date of Birth: 1965/09/24

## 2019-02-16 ENCOUNTER — Telehealth: Payer: Self-pay | Admitting: *Deleted

## 2019-02-16 ENCOUNTER — Other Ambulatory Visit: Payer: Self-pay | Admitting: Family Medicine

## 2019-02-16 DIAGNOSIS — D179 Benign lipomatous neoplasm, unspecified: Secondary | ICD-10-CM

## 2019-02-16 NOTE — Telephone Encounter (Signed)
Rather than referring her, we can schedule her for a CT of her legs to better visualize the area.  I have put the order in, so if she would like to pursue this imaging, she'll just need to be scheduled for the CT.  Thanks.

## 2019-02-16 NOTE — Telephone Encounter (Signed)
-----   Message from Kathrene Alu, MD sent at 02/13/2019  9:41 AM EDT ----- Would you let Ms. Keizer know that her ultrasound did not show any concerning findings?  Thanks!

## 2019-02-16 NOTE — Telephone Encounter (Signed)
Pt informed of below and she wanted to know what her next steps will be, she said she thought that if they did not see anything on [place] that she would be referred to someone else to see if they could figure out what was going on.  Routing to Dr. Shan Levans. Traevion Poehler Zimmerman Rumple, CMA

## 2019-02-17 ENCOUNTER — Other Ambulatory Visit: Payer: Self-pay | Admitting: Family Medicine

## 2019-02-17 DIAGNOSIS — I872 Venous insufficiency (chronic) (peripheral): Secondary | ICD-10-CM

## 2019-02-17 NOTE — Telephone Encounter (Signed)
Thanks for your help with this, April.  I have put in a new order for left lower extremity CT with contrast.

## 2019-02-17 NOTE — Telephone Encounter (Signed)
Contacted pt and she did say go ahead and schedule CT. Tried to schedule and they said that it was not correct based on what they have scheduled before. They put me through to [place] in radiology and she reviewed the [place] images and stated that based on the findings it should be left side. The order is Left lower Extremity with contrast, she said ultimately it is the doctors decision.  She said that with contrast would be a much better image for pt, unless there is an problem with pt having contrast.  Please advise when changed so we can get this scheduled. Dekota Kirlin Zimmerman Rumple, CMA

## 2019-02-18 NOTE — Telephone Encounter (Signed)
LVM on mobile to call office back, wanted to let her know that we had to change order for her CT and just want to confirm when a good time is for her to have this done.  We will schedule after we get this information and will call her back with the details.Kendra [name], CMA

## 2019-02-19 NOTE — Telephone Encounter (Signed)
2nd attempt   LVM on mobile to call office back, wanted to let her know that we had to change order for her CT and just want to confirm when a good time is for her to have this done.  We will schedule after we get this information and will call her back with the details.April Zimmerman Rumple, CMA

## 2019-02-20 NOTE — Telephone Encounter (Signed)
Contacted pt to inform her of her appointment to have her CT completed.  It is scheduled @ [facility] on 02/26/2019@3 :00pm, pt is to arrive at 2:45pm with mask and insurance information.  She should be liquids only 4 hrs prior to procedure. Kendra [name], CMA

## 2019-02-20 NOTE — Telephone Encounter (Signed)
Pt is returning April's call. She said the best number to call her back at is [phone]

## 2019-02-26 ENCOUNTER — Ambulatory Visit (HOSPITAL_COMMUNITY)
Admission: RE | Admit: 2019-02-26 | Discharge: 2019-02-26 | Disposition: A | Discharge status: Home / Self Care | Payer: Medicare Other | Source: Ambulatory Visit | Attending: Family Medicine | Admitting: Family Medicine

## 2019-02-26 ENCOUNTER — Other Ambulatory Visit: Payer: Self-pay

## 2019-02-26 DIAGNOSIS — I872 Venous insufficiency (chronic) (peripheral): Secondary | ICD-10-CM | POA: Diagnosis not present

## 2019-02-26 DIAGNOSIS — M1712 Unilateral primary osteoarthritis, left knee: Secondary | ICD-10-CM | POA: Diagnosis not present

## 2019-02-26 MED ORDER — IOHEXOL 300 MG/ML  SOLN
100.0000 mL | Freq: Once | INTRAMUSCULAR | Status: AC | PRN
  Administered 2019-02-26: 15:00:00 100 mL via INTRAVENOUS

## 2019-03-02 ENCOUNTER — Other Ambulatory Visit: Payer: Self-pay | Admitting: Family Medicine

## 2019-03-02 ENCOUNTER — Telehealth: Payer: Self-pay | Admitting: Family Medicine

## 2019-03-02 MED ORDER — CEPHALEXIN 500 MG PO CAPS: 500.0000 mg | ORAL_CAPSULE | Freq: Two times a day (BID) | ORAL | 0 refills | Status: AC

## 2019-03-02 NOTE — Telephone Encounter (Signed)
Spoke with Ms. Labarbera regarding her CT results of her left leg.  CT shows likely cellulitis without fasciitis or pyomyositis.  Will try a 10-day course of Keflex 500 mg twice daily.  Encouraged patient to let us know if her pain and swelling are alleviated after a 10-day course of this medication, because if her symptoms have not improved, we will need to think of another option for her.  Patient expressed understanding to this plan.  Amanda C. Shan Levans, MD PGY-3, [facility] 03/02/2019 9:10 AM

## 2019-03-03 ENCOUNTER — Other Ambulatory Visit: Payer: Self-pay

## 2019-03-03 ENCOUNTER — Ambulatory Visit: Payer: Medicare Other | Attending: Sports Medicine

## 2019-03-03 DIAGNOSIS — M25552 Pain in left hip: Secondary | ICD-10-CM | POA: Insufficient documentation

## 2019-03-03 DIAGNOSIS — R293 Abnormal posture: Secondary | ICD-10-CM | POA: Insufficient documentation

## 2019-03-03 DIAGNOSIS — G8929 Other chronic pain: Secondary | ICD-10-CM | POA: Diagnosis not present

## 2019-03-03 DIAGNOSIS — M25551 Pain in right hip: Secondary | ICD-10-CM | POA: Diagnosis not present

## 2019-03-03 DIAGNOSIS — M25512 Pain in left shoulder: Secondary | ICD-10-CM | POA: Insufficient documentation

## 2019-03-03 DIAGNOSIS — M542 Cervicalgia: Secondary | ICD-10-CM | POA: Insufficient documentation

## 2019-03-03 DIAGNOSIS — R252 Cramp and spasm: Secondary | ICD-10-CM | POA: Diagnosis not present

## 2019-03-03 DIAGNOSIS — R262 Difficulty in walking, not elsewhere classified: Secondary | ICD-10-CM | POA: Diagnosis not present

## 2019-03-03 NOTE — Therapy (Signed)
[place], [place], [address] Phone: [phone]   Fax:  [phone]  Physical Therapy Treatment  Patient Details  Name: Kendra [name] MRN: [id number] Date of Birth: 1966-01-02 Referring Provider (PT): Lilia Argue, DO   Encounter Date: 03/03/2019  PT End of Session - 03/03/19 1107    Visit Number  7    Number of Visits  13    Date for PT Re-Evaluation  03/20/19    Authorization Type  UHC MCR/MCD    PT Start Time  1105    PT Stop Time  1200    PT Time Calculation (min)  55 min    Activity Tolerance  Patient tolerated treatment well    Behavior During Therapy  [facility] for tasks assessed/performed       Past Medical History:  Diagnosis Date  . Anemia   . Asthma   . Esophagitis   . GERD (gastroesophageal reflux disease)   . HTN (hypertension)   . Hyperplastic colon polyp     Past Surgical History:  Procedure Laterality Date  . ABDOMINAL HYSTERECTOMY  04/12/15  . ANKLE SURGERY Left   . CESAREAN SECTION    . CYSTOSCOPY N/A 04/12/2015   Procedure: CYSTOSCOPY;  Surgeon: Lavonia Drafts, MD;  Location: [facility];  Service: Gynecology;  Laterality: N/A;  . FINGER SURGERY    . TIBIA FRACTURE SURGERY    . TUBAL LIGATION      There were no vitals filed for this visit.  Subjective Assessment - 03/03/19 1108    Subjective  She reports doing real good.  Pain 2/10   HAve  swelling in legs  and infection  taking meds.    Currently in Pain?  Yes    Pain Score  2     Pain Location  Neck    Pain Orientation  Left    Pain Descriptors / Indicators  Aching    Pain Type  Chronic pain    Pain Onset  More than a month ago    Pain Frequency  Constant    Aggravating Factors   using arm    Pain Relieving Factors  cchange position    Pain Score  2    Pain Location  Hip    Pain Orientation  Right;Left    Pain Descriptors / Indicators  Aching;Throbbing    Pain Type  Chronic pain    Pain Onset  More than a  month ago    Pain Frequency  Constant    Aggravating Factors   being on feet    Pain Relieving Factors  nothing specifically         [facility] PT Assessment - 03/03/19 0001      Assessment   Medical Diagnosis  left shoulder and hip pain    Referring Provider (PT)  Lilia Argue, DO      AROM   Cervical Flexion  52    Cervical Extension  55    Cervical - Right Side Bend  45    Cervical - Left Side Bend  45    Cervical - Right Rotation  70    Cervical - Left Rotation  68      Strength   Overall Strength Comments  UE WNL  no complaint of pain                    [facility] Adult PT Treatment/Exercise - 03/03/19 0001      Neck Exercises:  Seated   Neck Retraction  5 reps    Cervical Rotation  Right;Left;5 reps    Lateral Flexion  Right;Left;5 reps    Postural Training  retraction  neck      Moist Heat Therapy   Number Minutes Moist Heat  10 Minutes    Moist Heat Location  Cervical      Ultrasound   Ultrasound Location  100% 1.5 Wcm2 1 MHz    Ultrasound Parameters  LT neck    Ultrasound Goals  Pain      Manual Therapy   Soft tissue mobilization  TO left neck and upper traps , levator    Passive ROM  neck rotation               PT Short Term Goals - 03/03/19 1115      PT SHORT TERM GOAL #1   Title  she will be independent with initial HEP     Status  Achieved      PT SHORT TERM GOAL #2   Title  She will report hip and neck pain deecreased 10-20% or more    Status  Achieved      PT SHORT TERM GOAL #3   Title  She will report sleep improved 25% or more     Status  Achieved        PT Long Term Goals - 03/03/19 1116      PT LONG TERM GOAL #1   Title  She will be independent with all HEP issued.     Status  On-going      PT LONG TERM GOAL #2   Title  She will report neck pain as intermittant and decr 75%    Status  Partially Met      PT LONG TERM GOAL #3   Title  She will report able to use LT arm for normal activity with 1-2 max pain     Baseline  varies but much better per pt    Status  Partially Met      PT LONG TERM GOAL #4   Title  She will walk with decr limp due to decr Lt hip pain.     Baseline  continues toi limp . OA at knee, SPC made walking better with decr pain and smoother gait    Status  On-going      PT LONG TERM GOAL #5   Title  She will have min tenderness with palpation LT hip to demo decr pain    Baseline  moderate tnder . Improved but goal not met    Status  On-going            Plan - 03/03/19 1107    Clinical Impression Statement  She reports she is inmproved over past 2 weeks without PT  and her neck ROM is better.  She walks better with a SPC  with less LT leg pain and smoother gait.  I suggested she look into purchase of this to ease load to LT leg as the recent CT scan noted significant OA LT knee and this may contribute to LT hip pain.     Plan to see x 5 more sessions then discharge with HEp    PT Frequency  2x / week    PT Duration  3 weeks    PT Treatment/Interventions  Moist Heat;Iontophoresis 70m/ml Dexamethasone;Electrical Stimulation;Ultrasound;Stair training;Gait training;Therapeutic exercise;Manual techniques;Dry needling;Passive range of motion;Taping;Patient/family education    PT Next Visit Plan  modalities  manual,l , review HEP add as needed.  more attnesion to hip next time   /try green band for HEP    FOTO    PT Home Exercise Plan  cervical SB/ROT,  hip flex/adduct, scap and cervical retraction, 5/27- added bridge and supine clam     red band ER /hor abduction / extension,active LAQ  hip ER/IR,  marching,  abduction /adduction( pillow and red band)    Consulted and Agree with Plan of Care  Patient       Patient will benefit from skilled therapeutic intervention in order to improve the following deficits and impairments:  Pain, Postural dysfunction, Increased muscle spasms, Decreased activity tolerance, Decreased range of motion, Impaired UE functional use, Difficulty  walking  Visit Diagnosis: 1. Pain in left hip   2. Difficulty in walking, not elsewhere classified   3. Pain in right hip   4. Cramp and spasm        Problem List Patient Active Problem List   Diagnosis Date Noted  . Subcutaneous mass of both lower extremities 01/30/2019  . Headache 09/25/2018  . Hyperlipidemia 06/18/2018  . High risk medication use 02/18/2017  . Neck pain on left side 02/18/2017  . HTN (hypertension) 04/03/2016  . Leg swelling 02/15/2016  . Hemangioma of liver 07/07/2015  . Uterine fibroid 01/14/2015  . Dysfunctional uterine bleeding 01/14/2015  . Tendinopathy of right rotator cuff 08/24/2014  . Cervical neck pain with evidence of disc disease 05/14/2012  . Osteoarthrosis, unspecified whether generalized or localized, involving lower leg 05/14/2012  . Chronic low back pain 12/20/2010  . Morbid obesity ([place]) 04/07/2008  . ALCOHOL ABUSE 04/07/2008  . TOBACCO ABUSE 04/07/2008  . Iron deficiency anemia 10/24/2006  . GASTROESOPHAGEAL REFLUX, NO ESOPHAGITIS 10/24/2006    Darrel Hoover  PT 03/03/2019, 12:11 PM  [facility] [address] [place], [place], [address] Phone: [phone]   Fax:  [phone]  Name: LACOSTA HARGAN MRN: [id number] Date of Birth: Jan 21, 1966

## 2019-03-05 ENCOUNTER — Ambulatory Visit: Payer: Medicare Other | Admitting: Physical Therapy

## 2019-03-05 ENCOUNTER — Encounter: Payer: Self-pay | Admitting: Physical Therapy

## 2019-03-05 ENCOUNTER — Other Ambulatory Visit: Payer: Self-pay

## 2019-03-05 DIAGNOSIS — M25552 Pain in left hip: Secondary | ICD-10-CM

## 2019-03-05 DIAGNOSIS — R252 Cramp and spasm: Secondary | ICD-10-CM | POA: Diagnosis not present

## 2019-03-05 DIAGNOSIS — M25512 Pain in left shoulder: Secondary | ICD-10-CM | POA: Diagnosis not present

## 2019-03-05 DIAGNOSIS — M25551 Pain in right hip: Secondary | ICD-10-CM

## 2019-03-05 DIAGNOSIS — R262 Difficulty in walking, not elsewhere classified: Secondary | ICD-10-CM

## 2019-03-05 DIAGNOSIS — G8929 Other chronic pain: Secondary | ICD-10-CM

## 2019-03-05 DIAGNOSIS — R293 Abnormal posture: Secondary | ICD-10-CM

## 2019-03-05 DIAGNOSIS — M542 Cervicalgia: Secondary | ICD-10-CM | POA: Diagnosis not present

## 2019-03-05 NOTE — Therapy (Signed)
[place], [place], [address] Phone: [phone]   Fax:  [phone]  Physical Therapy Treatment  Patient Details  Name: Kendra [name] MRN: [id number] Date of Birth: 02-Jan-1966 Referring Provider (PT): Lilia Argue, DO   Encounter Date: 03/05/2019  PT End of Session - 03/05/19 1021    Visit Number  8    Number of Visits  13    Date for PT Re-Evaluation  03/20/19    Authorization Type  UHC MCR/MCD    PT Start Time  1020    PT Stop Time  1123    PT Time Calculation (min)  63 min       Past Medical History:  Diagnosis Date  . Anemia   . Asthma   . Esophagitis   . GERD (gastroesophageal reflux disease)   . HTN (hypertension)   . Hyperplastic colon polyp     Past Surgical History:  Procedure Laterality Date  . ABDOMINAL HYSTERECTOMY  04/12/15  . ANKLE SURGERY Left   . CESAREAN SECTION    . CYSTOSCOPY N/A 04/12/2015   Procedure: CYSTOSCOPY;  Surgeon: Lavonia Drafts, MD;  Location: [facility];  Service: Gynecology;  Laterality: N/A;  . FINGER SURGERY    . TIBIA FRACTURE SURGERY    . TUBAL LIGATION      There were no vitals filed for this visit.  Subjective Assessment - 03/05/19 1020    Subjective  Doing pretty good. Pain levels 2/10.    Currently in Pain?  Yes    Pain Score  2     Pain Location  Neck    Pain Score  2    Pain Location  Hip    Pain Orientation  Left;Right         [facility] PT Assessment - 03/05/19 0001      Observation/Other Assessments   Focus on Therapeutic Outcomes (FOTO)   NT                   [facility] Adult PT Treatment/Exercise - 03/05/19 0001      Knee/Hip Exercises: Stretches   Piriformis Stretch  Right;Left;1 rep;30 seconds    Gastroc Stretch Limitations  slant board stretch     Other Knee/Hip Stretches  figure 4 x 30 sec RT/LT ,      Other Knee/Hip Stretches  single knee to chest x 2 each       Knee/Hip Exercises: Aerobic   Nustep  L4 UE/LE x  5 minutes       Knee/Hip Exercises: Supine   Bridges  20 reps    Bridges Limitations  with ball and blue band as below    Other Supine Knee/Hip Exercises  clam shell blue band x 30, ball squeeze x 20    Other Supine Knee/Hip Exercises  Marching x 15 RT/LT       Knee/Hip Exercises: Sidelying   Clams  x15 RT/LT  and reverses clam      Shoulder Exercises: Standing   Horizontal ABduction  20 reps    Theraband Level (Shoulder Horizontal ABduction)  Level 3 (Green)    External Rotation  20 reps    Theraband Level (Shoulder External Rotation)  Level 2 (Red)    Extension  20 reps    Theraband Level (Shoulder Extension)  Level 3 (Green)    Row  20 reps    Theraband Level (Shoulder Row)  Level 3 (Green)      Moist Heat Therapy  Number Minutes Moist Heat  15 Minutes    Moist Heat Location  Lumbar Spine      Manual Therapy   Soft tissue mobilization  with roller to hips and back       Neck Exercises: Stretches   Upper Trapezius Stretch  Right;Left;20 seconds    Levator Stretch  Left;20 seconds               PT Short Term Goals - 03/03/19 1115      PT SHORT TERM GOAL #1   Title  she will be independent with initial HEP     Status  Achieved      PT SHORT TERM GOAL #2   Title  She will report hip and neck pain deecreased 10-20% or more    Status  Achieved      PT SHORT TERM GOAL #3   Title  She will report sleep improved 25% or more     Status  Achieved        PT Long Term Goals - 03/03/19 1116      PT LONG TERM GOAL #1   Title  She will be independent with all HEP issued.     Status  On-going      PT LONG TERM GOAL #2   Title  She will report neck pain as intermittant and decr 75%    Status  Partially Met      PT LONG TERM GOAL #3   Title  She will report able to use LT arm for normal activity with 1-2 max pain    Baseline  varies but much better per pt    Status  Partially Met      PT LONG TERM GOAL #4   Title  She will walk with decr limp due to decr Lt  hip pain.     Baseline  continues toi limp . OA at knee, SPC made walking better with decr pain and smoother gait    Status  On-going      PT LONG TERM GOAL #5   Title  She will have min tenderness with palpation LT hip to demo decr pain    Baseline  moderate tnder . Improved but goal not met    Status  On-going            Plan - 03/05/19 1115    Clinical Impression Statement  Pt arrives with 2/10 pain. She reports tenderness posterior hips/ low back. Continued with postural strength, core and hip strength/mobility. Massage roller used to decrease tenderness in lowe lumbar/gluteals    PT Next Visit Plan  modalities  manual,l , review HEP add as needed.  more attnesion to hip next time   /try green band for HEP    FOTO    PT Home Exercise Plan  cervical SB/ROT,  hip flex/adduct, scap and cervical retraction, 5/27- added bridge and supine clam     red band ER /hor abduction / extension,active LAQ  hip ER/IR,  marching,  abduction /adduction( pillow and red band)    Consulted and Agree with Plan of Care  Patient       Patient will benefit from skilled therapeutic intervention in order to improve the following deficits and impairments:  Pain, Postural dysfunction, Increased muscle spasms, Decreased activity tolerance, Decreased range of motion, Impaired UE functional use, Difficulty walking  Visit Diagnosis: 1. Pain in left hip   2. Difficulty in walking, not elsewhere classified   3. Pain  in right hip   4. Cramp and spasm   5. Cervicalgia   6. Chronic left shoulder pain   7. Abnormal posture        Problem List Patient Active Problem List   Diagnosis Date Noted  . Subcutaneous mass of both lower extremities 01/30/2019  . Headache 09/25/2018  . Hyperlipidemia 06/18/2018  . High risk medication use 02/18/2017  . Neck pain on left side 02/18/2017  . HTN (hypertension) 04/03/2016  . Leg swelling 02/15/2016  . Hemangioma of liver 07/07/2015  . Uterine fibroid 01/14/2015  .  Dysfunctional uterine bleeding 01/14/2015  . Tendinopathy of right rotator cuff 08/24/2014  . Cervical neck pain with evidence of disc disease 05/14/2012  . Osteoarthrosis, unspecified whether generalized or localized, involving lower leg 05/14/2012  . Chronic low back pain 12/20/2010  . Morbid obesity ([place]) 04/07/2008  . ALCOHOL ABUSE 04/07/2008  . TOBACCO ABUSE 04/07/2008  . Iron deficiency anemia 10/24/2006  . GASTROESOPHAGEAL REFLUX, NO ESOPHAGITIS 10/24/2006    Dorene Ar, PTA 03/05/2019, 11:23 AM  [facility] [address] [place], [place], [address] Phone: [phone]   Fax:  [phone]  Name: Kendra [name] MRN: [id number] Date of Birth: 05-18-1966

## 2019-03-10 ENCOUNTER — Ambulatory Visit: Payer: Medicare Other

## 2019-03-11 ENCOUNTER — Encounter: Payer: Self-pay | Admitting: Physical Therapy

## 2019-03-11 ENCOUNTER — Other Ambulatory Visit: Payer: Self-pay

## 2019-03-11 ENCOUNTER — Ambulatory Visit: Payer: Medicare Other | Admitting: Physical Therapy

## 2019-03-11 DIAGNOSIS — M542 Cervicalgia: Secondary | ICD-10-CM

## 2019-03-11 DIAGNOSIS — M25551 Pain in right hip: Secondary | ICD-10-CM

## 2019-03-11 DIAGNOSIS — R252 Cramp and spasm: Secondary | ICD-10-CM | POA: Diagnosis not present

## 2019-03-11 DIAGNOSIS — R293 Abnormal posture: Secondary | ICD-10-CM | POA: Diagnosis not present

## 2019-03-11 DIAGNOSIS — M25512 Pain in left shoulder: Secondary | ICD-10-CM | POA: Diagnosis not present

## 2019-03-11 DIAGNOSIS — R262 Difficulty in walking, not elsewhere classified: Secondary | ICD-10-CM

## 2019-03-11 DIAGNOSIS — G8929 Other chronic pain: Secondary | ICD-10-CM | POA: Diagnosis not present

## 2019-03-11 DIAGNOSIS — M25552 Pain in left hip: Secondary | ICD-10-CM | POA: Diagnosis not present

## 2019-03-11 NOTE — Therapy (Signed)
[place], [place], [address] Phone: [phone]   Fax:  [phone]  Physical Therapy Treatment  Patient Details  Name: Kendra [name] MRN: [id number] Date of Birth: 09-04-1965 Referring Provider (PT): Lilia Argue, DO   Encounter Date: 03/11/2019  PT End of Session - 03/11/19 1050    Visit Number  9    Number of Visits  13    Date for PT Re-Evaluation  03/20/19    Authorization Type  UHC MCR/MCD    PT Start Time  1045    PT Stop Time  1123    PT Time Calculation (min)  38 min       Past Medical History:  Diagnosis Date  . Anemia   . Asthma   . Esophagitis   . GERD (gastroesophageal reflux disease)   . HTN (hypertension)   . Hyperplastic colon polyp     Past Surgical History:  Procedure Laterality Date  . ABDOMINAL HYSTERECTOMY  04/12/15  . ANKLE SURGERY Left   . CESAREAN SECTION    . CYSTOSCOPY N/A 04/12/2015   Procedure: CYSTOSCOPY;  Surgeon: Lavonia Drafts, MD;  Location: [facility];  Service: Gynecology;  Laterality: N/A;  . FINGER SURGERY    . TIBIA FRACTURE SURGERY    . TUBAL LIGATION      There were no vitals filed for this visit.  Subjective Assessment - 03/11/19 1047    Subjective  Hip pain 2/10 , 3/10 shoulder pain.    Currently in Pain?  Yes    Pain Score  3     Pain Location  Shoulder    Pain Orientation  Left    Pain Descriptors / Indicators  Aching    Pain Type  Chronic pain    Aggravating Factors   laying down    Pain Relieving Factors  change positions    Pain Score  2    Pain Location  Hip    Pain Orientation  Right;Left    Pain Descriptors / Indicators  Aching;Throbbing    Aggravating Factors   standing, first stand up and walk, prolonged walking    Pain Relieving Factors  sitting                       [facility] Adult PT Treatment/Exercise - 03/11/19 0001      Knee/Hip Exercises: Standing   Other Standing Knee Exercises  standing 2 way hip  ext and abdct , cues for posture and gluteal squeze       Knee/Hip Exercises: Seated   Sit to Sand  10 reps   cues for gluteal squeeze      Knee/Hip Exercises: Supine   Bridges  20 reps    Bridges Limitations  with ball squeeze and blue band       Knee/Hip Exercises: Sidelying   Hip ABduction  10 reps    Clams  x15 RT/LT  and reverses clam      Shoulder Exercises: Supine   Other Supine Exercises  supine cane pullovers       Shoulder Exercises: Standing   External Rotation  Left;20 reps    Theraband Level (Shoulder External Rotation)  Level 2 (Red)    Internal Rotation  Left;20 reps    Theraband Level (Shoulder Internal Rotation)  Level 2 (Red)    Extension  20 reps    Theraband Level (Shoulder Extension)  Level 3 (Green)    Row  20 reps  Theraband Level (Shoulder Row)  Level 3 (Green)    Other Standing Exercises  left wall slide x 10       Neck Exercises: Stretches   Upper Trapezius Stretch  Right;Left;20 seconds    Levator Stretch  Left;20 seconds               PT Short Term Goals - 03/03/19 1115      PT SHORT TERM GOAL #1   Title  she will be independent with initial HEP     Status  Achieved      PT SHORT TERM GOAL #2   Title  She will report hip and neck pain deecreased 10-20% or more    Status  Achieved      PT SHORT TERM GOAL #3   Title  She will report sleep improved 25% or more     Status  Achieved        PT Long Term Goals - 03/03/19 1116      PT LONG TERM GOAL #1   Title  She will be independent with all HEP issued.     Status  On-going      PT LONG TERM GOAL #2   Title  She will report neck pain as intermittant and decr 75%    Status  Partially Met      PT LONG TERM GOAL #3   Title  She will report able to use LT arm for normal activity with 1-2 max pain    Baseline  varies but much better per pt    Status  Partially Met      PT LONG TERM GOAL #4   Title  She will walk with decr limp due to decr Lt hip pain.     Baseline   continues toi limp . OA at knee, SPC made walking better with decr pain and smoother gait    Status  On-going      PT LONG TERM GOAL #5   Title  She will have min tenderness with palpation LT hip to demo decr pain    Baseline  moderate tnder . Improved but goal not met    Status  On-going            Plan - 03/11/19 1158    Clinical Impression Statement  Pt reports pain is improving however she is still limited with left shoulder and with standing and walking. During standing exercises for shoulder she c/o left gluteal pain. Pt given tennis ball to work on gluteals at home.    PT Next Visit Plan  modalities  manual,l , review HEP add as needed.  more attnesion to hip next time   /try green band for HEP    PT Home Exercise Plan  cervical SB/ROT,  hip flex/adduct, scap and cervical retraction, 5/27- added bridge and supine clam     red band ER /hor abduction / extension,active LAQ  hip ER/IR,  marching,  abduction /adduction( pillow and red band)    Consulted and Agree with Plan of Care  Patient       Patient will benefit from skilled therapeutic intervention in order to improve the following deficits and impairments:  Pain, Postural dysfunction, Increased muscle spasms, Decreased activity tolerance, Decreased range of motion, Impaired UE functional use, Difficulty walking  Visit Diagnosis: 1. Pain in left hip   2. Difficulty in walking, not elsewhere classified   3. Pain in right hip   4. Cramp and spasm  5. Cervicalgia   6. Chronic left shoulder pain   7. Abnormal posture        Problem List Patient Active Problem List   Diagnosis Date Noted  . Subcutaneous mass of both lower extremities 01/30/2019  . Headache 09/25/2018  . Hyperlipidemia 06/18/2018  . High risk medication use 02/18/2017  . Neck pain on left side 02/18/2017  . HTN (hypertension) 04/03/2016  . Leg swelling 02/15/2016  . Hemangioma of liver 07/07/2015  . Uterine fibroid 01/14/2015  . Dysfunctional  uterine bleeding 01/14/2015  . Tendinopathy of right rotator cuff 08/24/2014  . Cervical neck pain with evidence of disc disease 05/14/2012  . Osteoarthrosis, unspecified whether generalized or localized, involving lower leg 05/14/2012  . Chronic low back pain 12/20/2010  . Morbid obesity ([place]) 04/07/2008  . ALCOHOL ABUSE 04/07/2008  . TOBACCO ABUSE 04/07/2008  . Iron deficiency anemia 10/24/2006  . GASTROESOPHAGEAL REFLUX, NO ESOPHAGITIS 10/24/2006    Dorene Ar, PTA 03/11/2019, 12:52 PM  [facility] [address] [place], [place], [address] Phone: [phone]   Fax:  [phone]  Name: NGAN QUALLS MRN: [id number] Date of Birth: [birth year]

## 2019-03-12 ENCOUNTER — Telehealth: Payer: Self-pay | Admitting: Physical Therapy

## 2019-03-12 ENCOUNTER — Ambulatory Visit: Payer: Medicare Other

## 2019-03-12 NOTE — Telephone Encounter (Signed)
Message left  about missed appointment today and that Monday  At 11AM is her next appointment.

## 2019-03-16 ENCOUNTER — Other Ambulatory Visit: Payer: Self-pay

## 2019-03-16 ENCOUNTER — Ambulatory Visit: Payer: Medicare Other

## 2019-03-16 DIAGNOSIS — M25552 Pain in left hip: Secondary | ICD-10-CM | POA: Diagnosis not present

## 2019-03-16 DIAGNOSIS — R262 Difficulty in walking, not elsewhere classified: Secondary | ICD-10-CM

## 2019-03-16 DIAGNOSIS — M25551 Pain in right hip: Secondary | ICD-10-CM

## 2019-03-16 DIAGNOSIS — M25512 Pain in left shoulder: Secondary | ICD-10-CM | POA: Diagnosis not present

## 2019-03-16 DIAGNOSIS — R252 Cramp and spasm: Secondary | ICD-10-CM | POA: Diagnosis not present

## 2019-03-16 DIAGNOSIS — M542 Cervicalgia: Secondary | ICD-10-CM | POA: Diagnosis not present

## 2019-03-16 DIAGNOSIS — R293 Abnormal posture: Secondary | ICD-10-CM | POA: Diagnosis not present

## 2019-03-16 DIAGNOSIS — G8929 Other chronic pain: Secondary | ICD-10-CM | POA: Diagnosis not present

## 2019-03-16 NOTE — Therapy (Addendum)
Kendra [name], [place], [address] Phone: [phone]   Fax:  [phone]  Physical Therapy Treatment  Patient Details  Name: Kendra [name] MRN: [id number] Date of Birth: August 29, 1965 Referring Provider (PT): Lilia Argue, DO  Progress Note Reporting Period 01/15/19 to 03/16/19  See note below for Objective Data and Assessment of Progress/Goals.      Encounter Date: 03/16/2019  PT End of Session - 03/16/19 1310    Visit Number  10    Number of Visits  13    Date for PT Re-Evaluation  03/20/19    Authorization Type  UHC MCR/MCD    PT Start Time  0103    PT Stop Time  0145    PT Time Calculation (min)  42 min    Activity Tolerance  Patient tolerated treatment well    Behavior During Therapy  WFL for tasks assessed/performed       Past Medical History:  Diagnosis Date  . Anemia   . Asthma   . Esophagitis   . GERD (gastroesophageal reflux disease)   . HTN (hypertension)   . Hyperplastic colon polyp     Past Surgical History:  Procedure Laterality Date  . ABDOMINAL HYSTERECTOMY  04/12/15  . ANKLE SURGERY Left   . CESAREAN SECTION    . CYSTOSCOPY N/A 04/12/2015   Procedure: CYSTOSCOPY;  Surgeon: Lavonia Drafts, MD;  Location: [facility];  Service: Gynecology;  Laterality: N/A;  . FINGER SURGERY    . TIBIA FRACTURE SURGERY    . TUBAL LIGATION      There were no vitals filed for this visit.  Subjective Assessment - 03/16/19 1308    Subjective  Hip pain 2/10 , 3/10 shoulder pain.    Pertinent History  bilateral knee replacements.   Needs RT TSA    Pain Score  3     Pain Location  Shoulder    Pain Orientation  Left    Pain Descriptors / Indicators  Aching    Pain Type  Chronic pain    Pain Onset  More than a month ago    Pain Frequency  Constant    Aggravating Factors   resting    Pain Relieving Factors  alter postiion    Pain Score  2    Pain Location  Hip    Pain Orientation   Right;Left    Pain Descriptors / Indicators  Aching;Throbbing    Pain Type  Chronic pain    Pain Onset  More than a month ago    Pain Frequency  Constant    Aggravating Factors   activity in feet.    Pain Relieving Factors  sitting                       [facility] Adult PT Treatment/Exercise - 03/16/19 0001      Knee/Hip Exercises: Stretches   ITB Stretch  Right;Left;2 reps;30 seconds    ITB Stretch Limitations  manually    Piriformis Stretch  Right;Left;5 reps;10 seconds      Knee/Hip Exercises: Aerobic   Nustep  L5 UE/LE x 5 minutes       Knee/Hip Exercises: Standing   Other Standing Knee Exercises  standing 2 way hip ext and abdct , cues for posture and gluteal squeze       Knee/Hip Exercises: Seated   Sit to Sand  10 reps;with UE support   push from legs 2 sets  Knee/Hip Exercises: Supine   Bridges Limitations  with ball squeeze and blue band  x 25    Other Supine Knee/Hip Exercises  Marching x 15 RT/LT red band      Knee/Hip Exercises: Sidelying   Hip ABduction  Right;Left;15 reps      Shoulder Exercises: Standing   External Rotation  Both;20 reps    Theraband Level (Shoulder External Rotation)  Level 2 (Red)    Internal Rotation  Right;Left;20 reps    Theraband Level (Shoulder Internal Rotation)  Level 2 (Red)    Extension  20 reps    Theraband Level (Shoulder Extension)  Level 2 (Red);Level 3 (Green)    Row  20 reps    Theraband Level (Shoulder Row)  Level 3 (Green)       10 min spent recreating HEP she lost        PT Short Term Goals - 03/03/19 1115      PT SHORT TERM GOAL #1   Title  she will be independent with initial HEP     Status  Achieved      PT SHORT TERM GOAL #2   Title  She will report hip and neck pain deecreased 10-20% or more    Status  Achieved      PT SHORT TERM GOAL #3   Title  She will report sleep improved 25% or more     Status  Achieved        PT Long Term Goals - 03/03/19 1116      PT LONG TERM GOAL #1    Title  She will be independent with all HEP issued.     Status  On-going      PT LONG TERM GOAL #2   Title  She will report neck pain as intermittant and decr 75%    Status  Partially Met      PT LONG TERM GOAL #3   Title  She will report able to use LT arm for normal activity with 1-2 max pain    Baseline  varies but much better per pt    Status  Partially Met      PT LONG TERM GOAL #4   Title  She will walk with decr limp due to decr Lt hip pain.     Baseline  continues toi limp . OA at knee, SPC made walking better with decr pain and smoother gait    Status  On-going      PT LONG TERM GOAL #5   Title  She will have min tenderness with palpation LT hip to demo decr pain    Baseline  moderate tnder . Improved but goal not met    Status  On-going            Plan - 03/16/19 1310    Clinical Impression Statement  Reports pain improved though pain levels  same for last 4 sessions. She lost her HEP so spent time today recreating this.    PT Frequency  2x / week    PT Duration  2 weeks    PT Treatment/Interventions  Moist Heat;Iontophoresis 30m/ml Dexamethasone;Electrical Stimulation;Ultrasound;Stair training;Gait training;Therapeutic exercise;Manual techniques;Dry needling;Passive range of motion;Taping;Patient/family education    PT Next Visit Plan  review and finalize HEP over next 3 sessions.    PT Home Exercise Plan  cervical SB/ROT,  hip flex/adduct, scap and cervical retraction, 5/27- added bridge and supine clam     red band ER /hor abduction / extension,active  LAQ  hip ER/IR,  marching,  abduction /adduction( pillow and red band), piriformis,    Consulted and Agree with Plan of Care  Patient       Patient will benefit from skilled therapeutic intervention in order to improve the following deficits and impairments:     Visit Diagnosis: 1. Difficulty in walking, not elsewhere classified   2. Pain in right hip   3. Pain in left hip        Problem List Patient  Active Problem List   Diagnosis Date Noted  . Subcutaneous mass of both lower extremities 01/30/2019  . Headache 09/25/2018  . Hyperlipidemia 06/18/2018  . High risk medication use 02/18/2017  . Neck pain on left side 02/18/2017  . HTN (hypertension) 04/03/2016  . Leg swelling 02/15/2016  . Hemangioma of liver 07/07/2015  . Uterine fibroid 01/14/2015  . Dysfunctional uterine bleeding 01/14/2015  . Tendinopathy of right rotator cuff 08/24/2014  . Cervical neck pain with evidence of disc disease 05/14/2012  . Osteoarthrosis, unspecified whether generalized or localized, involving lower leg 05/14/2012  . Chronic low back pain 12/20/2010  . Morbid obesity ([place]) 04/07/2008  . ALCOHOL ABUSE 04/07/2008  . TOBACCO ABUSE 04/07/2008  . Iron deficiency anemia 10/24/2006  . GASTROESOPHAGEAL REFLUX, NO ESOPHAGITIS 10/24/2006    Darrel Hoover  PT 03/16/2019, 1:53 PM  [facility] [address] [place], [place], [address] Phone: [phone]   Fax:  [phone]  Name: Kendra [name] MRN: [id number] Date of Birth: 08/31/1965

## 2019-03-18 ENCOUNTER — Ambulatory Visit: Payer: Medicare Other

## 2019-03-19 ENCOUNTER — Other Ambulatory Visit: Payer: Self-pay

## 2019-03-19 ENCOUNTER — Ambulatory Visit: Payer: Medicare Other

## 2019-03-19 DIAGNOSIS — R252 Cramp and spasm: Secondary | ICD-10-CM | POA: Diagnosis not present

## 2019-03-19 DIAGNOSIS — M25512 Pain in left shoulder: Secondary | ICD-10-CM | POA: Diagnosis not present

## 2019-03-19 DIAGNOSIS — M25551 Pain in right hip: Secondary | ICD-10-CM

## 2019-03-19 DIAGNOSIS — G8929 Other chronic pain: Secondary | ICD-10-CM | POA: Diagnosis not present

## 2019-03-19 DIAGNOSIS — R262 Difficulty in walking, not elsewhere classified: Secondary | ICD-10-CM | POA: Diagnosis not present

## 2019-03-19 DIAGNOSIS — M25552 Pain in left hip: Secondary | ICD-10-CM

## 2019-03-19 DIAGNOSIS — M542 Cervicalgia: Secondary | ICD-10-CM | POA: Diagnosis not present

## 2019-03-19 DIAGNOSIS — R293 Abnormal posture: Secondary | ICD-10-CM | POA: Diagnosis not present

## 2019-03-19 NOTE — Therapy (Signed)
[place], [place], [address] Phone: [phone]   Fax:  [phone]  Physical Therapy Treatment  Patient Details  Name: Kendra [name] MRN: [id number] Date of Birth: Jun 13, 1966 Referring Provider (PT): Lilia Argue, DO   Encounter Date: 03/19/2019  PT End of Session - 03/19/19 1101    Visit Number  11    Number of Visits  16    Date for PT Re-Evaluation  04/10/19    Authorization Type  UHC MCR/MCD    PT Start Time  1100    PT Stop Time  1150    PT Time Calculation (min)  50 min    Activity Tolerance  Patient tolerated treatment well    Behavior During Therapy  [facility] for tasks assessed/performed       Past Medical History:  Diagnosis Date  . Anemia   . Asthma   . Esophagitis   . GERD (gastroesophageal reflux disease)   . HTN (hypertension)   . Hyperplastic colon polyp     Past Surgical History:  Procedure Laterality Date  . ABDOMINAL HYSTERECTOMY  04/12/15  . ANKLE SURGERY Left   . CESAREAN SECTION    . CYSTOSCOPY N/A 04/12/2015   Procedure: CYSTOSCOPY;  Surgeon: Lavonia Drafts, MD;  Location: [facility];  Service: Gynecology;  Laterality: N/A;  . FINGER SURGERY    . TIBIA FRACTURE SURGERY    . TUBAL LIGATION      There were no vitals filed for this visit.  Subjective Assessment - 03/19/19 1202    Subjective  Hip pain 4/10 , 2/10 shoulder pain.                       [facility] Adult PT Treatment/Exercise - 03/19/19 0001      Knee/Hip Exercises: Seated   Long Arc Quad  Right;Left;15 reps    Other Seated Knee/Hip Exercises  Clams and ball squeeze x 15      Knee/Hip Exercises: Supine   Bridges  20 reps    Straight Leg Raises  Right;Left;2 sets;10 reps    Other Supine Knee/Hip Exercises  BALL SQUEEZE AND BAND CLAM X 15    Other Supine Knee/Hip Exercises  Marching x 15 RT/LT red band      Knee/Hip Exercises: Sidelying   Hip ABduction  Right;Left;15 reps    Clams  x15  RT/LT  and reverses clam      Shoulder Exercises: Seated   Other Seated Exercises  green band x 12 ER /scap retract ,  hor abduction               PT Short Term Goals - 03/19/19 1159      PT SHORT TERM GOAL #1   Title  she will be independent with initial HEP     Status  Achieved      PT SHORT TERM GOAL #2   Title  She will report hip and neck pain deecreased 10-20% or more    Baseline  neck yes, hip no    Status  Partially Met      PT SHORT TERM GOAL #3   Title  She will report sleep improved 25% or more     Status  Achieved        PT Long Term Goals - 03/19/19 1200      PT LONG TERM GOAL #1   Title  She will be independent with all HEP issued.  Status  On-going      PT LONG TERM GOAL #2   Title  She will report neck pain as intermittant and decr 75%    Baseline  still constant    Status  Partially Met      PT LONG TERM GOAL #3   Title  She will report able to use LT arm for normal activity with 1-2 max pain    Baseline  varies but much better per pt    Status  Partially Met      PT LONG TERM GOAL #4   Title  She will walk with decr limp due to decr Lt hip pain.     Baseline  No limp today    Status  Partially Met      PT LONG TERM GOAL #5   Title  She will have min tenderness with palpation LT hip to demo decr pain    Baseline  Not changed over last 2 weeks    Status  On-going            Plan - 03/19/19 1153    Clinical Impression Statement  Ms Stamant did all hEP after recreation  of HEP correctly .   She was able to do them all smoothly  though PPT and bridge were somewhat limited with range.    Will review once more  and then  i will see for 4 more sessions with STW and stretching to hips and back and oif pain no different discharge    PT Frequency  2x / week    PT Duration  3 weeks    PT Treatment/Interventions  Moist Heat;Iontophoresis 69m/ml Dexamethasone;Electrical Stimulation;Ultrasound;Stair training;Gait training;Therapeutic  exercise;Manual techniques;Dry needling;Passive range of motion;Taping;Patient/family education    PT Next Visit Plan  review and finalize HEP next session .  Have schedul 3 more sessions over 2 week period please with me and or you to work on STW and stretch to hips and lower back.    PT Home Exercise Plan  cervical SB/ROT,  hip flex/adduct, scap and cervical retraction, 5/27- added bridge and supine clam     red band ER /hor abduction / extension,active LAQ  hip ER/IR,  marching,  abduction /adduction( pillow and red band), piriformis,    Consulted and Agree with Plan of Care  Patient       Patient will benefit from skilled therapeutic intervention in order to improve the following deficits and impairments:  Pain, Postural dysfunction, Increased muscle spasms, Decreased activity tolerance, Decreased range of motion, Impaired UE functional use, Difficulty walking  Visit Diagnosis: 1. Difficulty in walking, not elsewhere classified   2. Pain in right hip   3. Pain in left hip   4. Cramp and spasm        Problem List Patient Active Problem List   Diagnosis Date Noted  . Subcutaneous mass of both lower extremities 01/30/2019  . Headache 09/25/2018  . Hyperlipidemia 06/18/2018  . High risk medication use 02/18/2017  . Neck pain on left side 02/18/2017  . HTN (hypertension) 04/03/2016  . Leg swelling 02/15/2016  . Hemangioma of liver 07/07/2015  . Uterine fibroid 01/14/2015  . Dysfunctional uterine bleeding 01/14/2015  . Tendinopathy of right rotator cuff 08/24/2014  . Cervical neck pain with evidence of disc disease 05/14/2012  . Osteoarthrosis, unspecified whether generalized or localized, involving lower leg 05/14/2012  . Chronic low back pain 12/20/2010  . Morbid [place] 04/07/2008  . ALCOHOL ABUSE 04/07/2008  .  TOBACCO ABUSE 04/07/2008  . Iron deficiency anemia 10/24/2006  . GASTROESOPHAGEAL REFLUX, NO ESOPHAGITIS 10/24/2006    Darrel Hoover  PT 03/19/2019, 12:02  PM  [facility] [address] [place], [place], [address] Phone: [phone]   Fax:  [phone]  Name: JAYANNA KROEGER MRN: [id number] Date of Birth: 12-18-1965

## 2019-03-19 NOTE — Patient Instructions (Signed)
Shoulder Retraction    Tuck in chin and gently pull shoulders back. Hold _5___ seconds. Repeat _3___ times. Do _6___ sessions per day.  http://gt2.exer.us/5   Copyright  VHI. All rights reserved.  Neck Retraction: Side-Bend    Sitting or standing, tuck chin and side-bend head toward left shoulder. Repeat ___2_ times per set. Do _1___ sets per session. Do ___3Hip Adduction    Squeeze knees together, spread them apart and bring them back together. Work within the controllable range, even if only a few inches. Repeat __15__ times. Do ___1_ sessions per day.   PULL APART WITH BAND AND SQUEEZE WITH PILLOW                                                                                                                                                                                                                                                                                                                                                                       Bridge    Lying on back, legs bent 90, feet flat on floor. Press up hips and torso, reaching hands to feet. Hold for ____ breaths. ADVANCED: Clasp hands underneath back and squeeze shoulder blades together, lifting upper body onto outside of shoulders.  Copyright  VHI. All rights reserved.  PULL APART  WITH BAND  AND SQUEEZE WITH PILLOW BETWEEN KNEES  http://gt2.exer.us/714   Copyright  VHI. All rights reserved.  _ sessions per day. 3 http://orth.exer.us/387   Copyright  VHI. All rights reserved.

## 2019-03-23 ENCOUNTER — Telehealth (INDEPENDENT_AMBULATORY_CARE_PROVIDER_SITE_OTHER): Payer: Medicare Other | Admitting: Family Medicine

## 2019-03-23 ENCOUNTER — Other Ambulatory Visit: Payer: Self-pay

## 2019-03-23 ENCOUNTER — Encounter: Payer: Self-pay | Admitting: Family Medicine

## 2019-03-23 DIAGNOSIS — M7989 Other specified soft tissue disorders: Secondary | ICD-10-CM

## 2019-03-23 NOTE — Progress Notes (Signed)
Virtual Visit via Telephone Note  I connected with Kendra [name] on 03/23/19 at  1:30 PM EDT by telephone and verified that I am speaking with the correct person.  Patient does not have video capabilities so using a telephone  Location: [name] Provider: [facility] clinic   I discussed the limitations, risks, security and privacy concerns of performing an evaluation and management service by telephone and the availability of in person appointments. I also discussed with the patient that there may be a patient responsible charge related to this service. The patient expressed understanding and agreed to proceed.   History of Present Illness: Patient with known history of venous insufficiency.  She does follow with PT to try and help with both her significant swelling in her legs and neck pain.   She follows with them twice per week at this point for 45 minutes.  She said that her leg problems are still persistent and that they are much worse when she is up and walking around and then whenever she sits down and props her legs up which she is usually faithful to do, the swelling will go down.  She still able to walk around and has no dramatic change in her status.  She said that she did take the course of Keflex as prescribed by Dr. Shan Levans earlier this month and that that issue has resolved.  She does not think this is infectious.  She is willing to come in for an appointment for physical exam   Observations/Objective: Alert and oriented, able to discuss her medication plan and status appropriately.  Speaking in full sentences with no sign of distress.  Unable to do exam on the phone  Assessment and Plan: Patient complaining of leg swelling, it very likely that this is chronic venous insufficiency which would not particularly have any treatment except for compression stockings at this time, we can review her physical status and an actual in person appointment.  She has been scheduled for July 28  at 130 after her PT appointment that morning.  Follow Up Instructions:    I discussed the assessment and treatment plan with the patient. The patient was provided an opportunity to ask questions and all were answered. The patient agreed with the plan and demonstrated an understanding of the instructions.   The patient was advised to call back or seek an in-person evaluation if the symptoms worsen or if the condition fails to improve as anticipated.  I provided 12 minutes of non-face-to-face time during this encounter.   Sherene Sires, DO

## 2019-03-24 ENCOUNTER — Ambulatory Visit (INDEPENDENT_AMBULATORY_CARE_PROVIDER_SITE_OTHER): Payer: Medicare Other | Admitting: Family Medicine

## 2019-03-24 ENCOUNTER — Other Ambulatory Visit: Payer: Self-pay

## 2019-03-24 VITALS — BP 132/64 | HR 87

## 2019-03-24 DIAGNOSIS — R0609 Other forms of dyspnea: Secondary | ICD-10-CM

## 2019-03-24 DIAGNOSIS — M7989 Other specified soft tissue disorders: Secondary | ICD-10-CM | POA: Diagnosis not present

## 2019-03-24 LAB — POCT UA - MICROALBUMIN
Albumin/Creatinine Ratio, Urine, POC: <30
Creatinine, POC: 10 mg/dL
Microalbumin Ur, POC: 10 mg/L

## 2019-03-24 NOTE — Assessment & Plan Note (Addendum)
Patient with bilateral lower extremity swelling..  Although has normal cardiovascular exam, but This does make it difficult to fully rule out abnormalities.  Need to rule out possible heart failure.  We will also screen for nephrotic syndrome and liver dysfunction. -Echocardiogram -Chest x-ray -CMP -Urine creatinine albumin ratio -Follow-up in 2 weeks or sooner depending on results

## 2019-03-24 NOTE — Progress Notes (Signed)
    Subjective:  Kendra [name] is a 53 y.o. female who presents to the [facility] today with a chief complaint of lower leg swelling and shortness of breath with exertion.   HPI: Patient is been having worsening lower extremity swelling for the past 6 months.  Has had swelling of the lower extremities for 5 years.  Patient had some work-up of this including ultrasound of lower extremities in 01/2019 and lower extremity CT in 02/2019.  Although the CT had findings of possible cellulitis of the left leg, neither the CT of the ultrasound really radiated mechanism for lower extremity swelling.  Patient was treated with a course of Keflex for possible cellulitis.  Patient also endorses dyspnea with exertion.  This is happened about the same time.  Noted worsening lower extremity swelling in the past 6 months.  Patient endorses bilateral lower extremity pain described as "electrical".  And "knots" in her calfs.   Patient has normal creatinine on 11/2018.  ROS: Per HPI  PMH: Patient denies history of cancer, history of alcohol use, history of cancer surgery, she does not snore.   Objective:  Physical Exam: BP 132/64   Pulse 87   LMP 02/21/2015   SpO2 98% Comment: 98-95  Gen: NAD, resting comfortably, large habitus CV: RRR with no murmurs appreciated Pulm: NWOB, CTAB with no crackles, wheezes, or rhonchi GI: Normal bowel sounds present. Soft, Nontender, Nondistended. MSK: 2+ pitting edema in lower extremities bilaterally to knees, calf fullness, no discernable masses, mildly tender to palpation Skin: warm, dry, no rash or skin change or erythema Neuro: grossly normal, moves all extremities Psych: Normal affect and thought content Ambulatory pulse ox between 95 to 98%. Results for orders placed or performed in visit on 03/24/19 (from the past 72 hour(s))  POCT UA - Microalbumin     Status: None   Collection Time: 03/24/19  2:20 PM  Result Value Ref Range   Microalbumin Ur, POC 10 mg/L   Creatinine,  POC 10 mg/dL   Albumin/Creatinine Ratio, Urine, POC <30      Assessment/Plan:  Dyspnea on exertion Patient with bilateral lower extremity swelling..  Although has normal cardiovascular exam, but This does make it difficult to fully rule out abnormalities.  Need to rule out possible heart failure.  We will also screen for nephrotic syndrome and liver dysfunction. -Echocardiogram -Chest x-ray -CMP -Urine creatinine albumin ratio -Follow-up in 2 weeks or sooner depending on results  Case precepted with Dr. Gwendlyn Deutscher.  Lab Orders     Comprehensive metabolic panel     POCT UA - Microalbumin  No orders of the defined types were placed in this encounter.     Kendra Lowenstein, MD, MS FAMILY MEDICINE RESIDENT - PGY3 03/24/2019 2:47 PM

## 2019-03-24 NOTE — Patient Instructions (Signed)
It was a pleasure to see you today! Thank you for choosing [facility] for your primary care. Kendra [name] was seen for lower leg swelling and shortness of breath.   We are trying to rule out potentially dangerous causes of lower leg swelling when paired with shortness of breath.  1. We are checking your kidneys and liver function by blood and urine  2. We are sending you to get a picture of your heart and a chest xray to check your heart function.   Come back to the clinic in 2 weeks to discuss your results.   Best,  Marny Lowenstein, MD, MS FAMILY MEDICINE RESIDENT - PGY3 03/24/2019 2:05 PM

## 2019-03-25 ENCOUNTER — Ambulatory Visit: Payer: Medicare Other | Admitting: Physical Therapy

## 2019-03-25 ENCOUNTER — Encounter: Payer: Self-pay | Admitting: Family Medicine

## 2019-03-25 ENCOUNTER — Telehealth: Payer: Self-pay | Admitting: Physical Therapy

## 2019-03-25 LAB — COMPREHENSIVE METABOLIC PANEL
ALT: 17 IU/L (ref 0–32)
AST: 17 IU/L (ref 0–40)
Albumin/Globulin Ratio: 1.8 (ref 1.2–2.2)
Albumin: 3.9 g/dL (ref 3.8–4.9)
Alkaline Phosphatase: 68 IU/L (ref 39–117)
BUN/Creatinine Ratio: 14 (ref 9–23)
BUN: 11 mg/dL (ref 6–24)
Bilirubin Total: 0.4 mg/dL (ref 0.0–1.2)
CO2: 20 mmol/L (ref 20–29)
Calcium: 10.3 mg/dL — ABNORMAL HIGH (ref 8.7–10.2)
Chloride: 108 mmol/L — ABNORMAL HIGH (ref 96–106)
Creatinine, Ser: 0.79 mg/dL (ref 0.57–1.00)
GFR calc Af Amer: 100 mL/min/{1.73_m2} (ref >59)
GFR calc non Af Amer: 86 mL/min/{1.73_m2} (ref >59)
Globulin, Total: 2.2 g/dL (ref 1.5–4.5)
Glucose: 108 mg/dL — ABNORMAL HIGH (ref 65–99)
Potassium: 4.3 mmol/L (ref 3.5–5.2)
Sodium: 142 mmol/L (ref 134–144)
Total Protein: 6.1 g/dL (ref 6.0–8.5)

## 2019-03-25 NOTE — Telephone Encounter (Signed)
Left message on mobile voice mail regarding no show to appointment today. Left next appointment time.

## 2019-03-26 ENCOUNTER — Other Ambulatory Visit: Payer: Self-pay

## 2019-03-26 ENCOUNTER — Ambulatory Visit: Payer: Medicare Other

## 2019-03-26 NOTE — Therapy (Signed)
[place], [place], [address] Phone: [phone]   Fax:  [phone]  Physical Therapy Treatment/Discharge  Patient Details  Name: Kendra [name] MRN: [id number] Date of Birth: Sep 16, 1965 Referring Provider (PT): Lilia Argue, DO   Encounter Date: 03/26/2019  PT End of Session - 03/26/19 1051    Visit Number  12    Number of Visits  16    Date for PT Re-Evaluation  04/10/19    Authorization Type  [employer] MCR/MCD    PT Start Time  1055    PT Stop Time  1100    PT Time Calculation (min)  5 min    Activity Tolerance  --    Behavior During Therapy  [facility] for tasks assessed/performed       Past Medical History:  Diagnosis Date  . Anemia   . Asthma   . Esophagitis   . GERD (gastroesophageal reflux disease)   . HTN (hypertension)   . Hyperplastic colon polyp     Past Surgical History:  Procedure Laterality Date  . ABDOMINAL HYSTERECTOMY  04/12/15  . ANKLE SURGERY Left   . CESAREAN SECTION    . CYSTOSCOPY N/A 04/12/2015   Procedure: CYSTOSCOPY;  Surgeon: Lavonia Drafts, MD;  Location: [facility];  Service: Gynecology;  Laterality: N/A;  . FINGER SURGERY    . TIBIA FRACTURE SURGERY    . TUBAL LIGATION      There were no vitals filed for this visit.  Subjective Assessment - 03/26/19 1101    Subjective  Kendra [name] came in today to ask for discharge due to having an assessment soon for swelling in legs due to possible heart issues. She was told to discontinue PT so she wasked for discharge.                                 PT Short Term Goals - 03/19/19 1159      PT SHORT TERM GOAL #1   Title  she will be independent with initial HEP     Status  Achieved      PT SHORT TERM GOAL #2   Title  She will report hip and neck pain deecreased 10-20% or more    Baseline  neck yes, hip no    Status  Partially Met      PT SHORT TERM GOAL #3   Title  She will report sleep  improved 25% or more     Status  Achieved        PT Long Term Goals - 03/19/19 1200      PT LONG TERM GOAL #1   Title  She will be independent with all HEP issued.     Status  On-going      PT LONG TERM GOAL #2   Title  She will report neck pain as intermittant and decr 75%    Baseline  still constant    Status  Partially Met      PT LONG TERM GOAL #3   Title  She will report able to use LT arm for normal activity with 1-2 max pain    Baseline  varies but much better per pt    Status  Partially Met      PT LONG TERM GOAL #4   Title  She will walk with decr limp due to decr Lt hip pain.     Baseline  No limp today    Status  Partially Met      PT LONG TERM GOAL #5   Title  She will have min tenderness with palpation LT hip to demo decr pain    Baseline  Not changed over last 2 weeks    Status  On-going            Plan - 03/26/19 1052    Clinical Impression Statement  Kendra [name] requested discharge due to up coming procedure to assess heart function. She will return with new order if she is allowed and she feels she is ready.    Examination-Activity Limitations  Sleep;Carry;Stand;Lift;Stairs;Locomotion Level    PT Treatment/Interventions  Moist Heat;Iontophoresis 13m/ml Dexamethasone;Electrical Stimulation;Ultrasound;Stair training;Gait training;Therapeutic exercise;Manual techniques;Dry needling;Passive range of motion;Taping;Patient/family education    PT Next Visit Plan  Discharge    PT Home Exercise Plan  cervical SB/ROT,  hip flex/adduct, scap and cervical retraction, 5/27- added bridge and supine clam     red band ER /hor abduction / extension,active LAQ  hip ER/IR,  marching,  abduction /adduction( pillow and red band), piriformis,    Consulted and Agree with Plan of Care  Patient       Patient will benefit from skilled therapeutic intervention in order to improve the following deficits and impairments:  Pain, Postural dysfunction, Increased muscle spasms,  Decreased activity tolerance, Decreased range of motion, Impaired UE functional use, Difficulty walking  Visit Diagnosis: 1. Difficulty in walking, not elsewhere classified   2. Pain in right hip   3. Pain in left hip   4. Cramp and spasm        Problem List Patient Active Problem List   Diagnosis Date Noted  . Subcutaneous mass of both lower extremities 01/30/2019  . Headache 09/25/2018  . Hyperlipidemia 06/18/2018  . High risk medication use 02/18/2017  . Neck pain on left side 02/18/2017  . HTN (hypertension) 04/03/2016  . Swelling of lower extremity 02/15/2016  . Hemangioma of liver 07/07/2015  . Uterine fibroid 01/14/2015  . Dysfunctional uterine bleeding 01/14/2015  . Tendinopathy of right rotator cuff 08/24/2014  . Dyspnea on exertion 06/01/2013  . Cervical neck pain with evidence of disc disease 05/14/2012  . Osteoarthrosis, unspecified whether generalized or localized, involving lower leg 05/14/2012  . Chronic low back pain 12/20/2010  . Morbid obesity ([place] 04/07/2008  . ALCOHOL ABUSE 04/07/2008  . TOBACCO ABUSE 04/07/2008  . Iron deficiency anemia 10/24/2006  . GASTROESOPHAGEAL REFLUX, NO ESOPHAGITIS 10/24/2006    CDarrel [name] PT 03/26/2019, 11:06 AM  [facility] [address]Phone: [phone]  Fax:  [phone] Name: DDAVID TOWSONMRN: [id number] of Birth: 323-Nov-1967                                             PHYSICAL THERAPY DISCHARGE SUMMARY  Visits from Start of Care: 11  Current functional level related to goals / functional outcomes: See above   Remaining deficits: See above   Education / Equipment: HEP Plan: Patient agrees to discharge.  Patient goals were partially met. Patient is being discharged due to a change in medical status.  ?????

## 2019-03-27 ENCOUNTER — Ambulatory Visit (HOSPITAL_COMMUNITY): Admission: RE | Admit: 2019-03-27 | Payer: Medicare Other | Source: Ambulatory Visit

## 2019-03-27 ENCOUNTER — Ambulatory Visit (HOSPITAL_COMMUNITY)
Admission: RE | Admit: 2019-03-27 | Discharge: 2019-03-27 | Disposition: A | Discharge status: Home / Self Care | Payer: Medicare Other | Source: Ambulatory Visit | Attending: Family Medicine | Admitting: Family Medicine

## 2019-03-27 DIAGNOSIS — M7989 Other specified soft tissue disorders: Secondary | ICD-10-CM | POA: Diagnosis not present

## 2019-03-27 DIAGNOSIS — R0609 Other forms of dyspnea: Secondary | ICD-10-CM

## 2019-03-30 NOTE — Progress Notes (Signed)
Please call patient and tell her that her Xray is normal.

## 2019-04-01 ENCOUNTER — Encounter (HOSPITAL_COMMUNITY): Payer: Self-pay | Admitting: Family Medicine

## 2019-04-07 ENCOUNTER — Other Ambulatory Visit: Payer: Self-pay | Admitting: Internal Medicine

## 2019-04-07 DIAGNOSIS — K219 Gastro-esophageal reflux disease without esophagitis: Secondary | ICD-10-CM

## 2019-04-09 ENCOUNTER — Other Ambulatory Visit: Payer: Self-pay

## 2019-04-09 ENCOUNTER — Encounter: Payer: Self-pay | Admitting: Family Medicine

## 2019-04-09 ENCOUNTER — Ambulatory Visit (INDEPENDENT_AMBULATORY_CARE_PROVIDER_SITE_OTHER): Payer: Medicare Other | Admitting: Family Medicine

## 2019-04-09 VITALS — BP 122/76 | HR 102 | Ht 64.0 in | Wt 268.0 lb

## 2019-04-09 DIAGNOSIS — R0609 Other forms of dyspnea: Secondary | ICD-10-CM

## 2019-04-09 DIAGNOSIS — M7989 Other specified soft tissue disorders: Secondary | ICD-10-CM

## 2019-04-09 DIAGNOSIS — F17200 Nicotine dependence, unspecified, uncomplicated: Secondary | ICD-10-CM | POA: Diagnosis not present

## 2019-04-09 NOTE — Assessment & Plan Note (Addendum)
Associated with lower extremity swelling, lower extremity swelling is improved.  Getting echo to rule out heart failure.  Hypoalbuminemia state ruled out.  Will get CBC to rule out anemia.  Elevated STOP-BANG score at 5.  Should assess for obstructive sleep apnea at next visit..  Consider sleep study.  May also be related to deconditioning due to smoking and obesity hypoventilation syndrome given morbid obesity.

## 2019-04-09 NOTE — Assessment & Plan Note (Signed)
Likely venous insufficiency.  Not due to hypovolemic stance is been ruled out.  Getting echo to rule out heart failure.  Will trial compression stockings.

## 2019-04-09 NOTE — Assessment & Plan Note (Signed)
Patient instructed in nutrition therapy to assist with weight loss.  Will refer.

## 2019-04-09 NOTE — Patient Instructions (Signed)
It was a pleasure to see you today! Thank you for choosing [facility] for your primary care. Kendra [name] was seen for lower extremity swelling and shortness of breath.   1.  Please go to your echocardiogram tomorrow to screen for heart abnormalities.  2.  We are writing a prescription for compression stockings to help with lower extremity swelling.  3.  We are checking your blood counts to check for anemia.  4.  We are referring you to nutrition to help with weight loss.  5.  Please follow-up with Dr. Criss Rosales in 1 month to discuss smoking cessation.  Come back to clinic sooner or go to the emergency room if you have worsening lower extremity swelling, shortness of breath, chest pain, loss of consciousness  Best,  Marny Lowenstein, MD, MS FAMILY MEDICINE RESIDENT - PGY3 04/09/2019 10:39 AM

## 2019-04-09 NOTE — Assessment & Plan Note (Signed)
Patient interested in smoking.  Patient to follow-up with Dr. Criss Rosales in 1 month to discuss smoking cessation.

## 2019-04-09 NOTE — Progress Notes (Signed)
    Subjective:  Kendra [name] is a 53 y.o. female who presents to the [facility] today for follow-up on lower extremity swelling and shortness of breath.   HPI:  Lower extremity swelling Patient related to improve right now, but worsens at night.  Says overall she has not had any change.  Work-up thus far did not show any evidence of low albumin state for lower extremity swelling.  Shortness of breath Associated with some chest pain.  Checks x-ray was normal.  Patient waiting to get echocardiogram tomorrow.  Shortness of breath is not getting worse.  Pain is intermittent.  Happens across her entire chest.  Smoking Patient with 15-pack-year history.  Is interested in quitting.  ROS: Per HPI  PMH: Smoking history reviewed.    Objective:  Physical Exam: BP 122/76   Pulse (!) 102   Ht 5\' 4"  (1.626 m)   Wt 268 lb (121.6 kg)   LMP 02/21/2015   SpO2 98%   BMI 46.00 kg/m   Gen: NAD, resting comfortably CV: RRR with no murmurs appreciated, no JVD Pulm: NWOB, CTAB with no crackles, wheezes, or rhonchi GI:  Soft, Nontender, Nondistended. MSK: 1+ edema, no cyanosis, or clubbing noted Skin: warm, dry Neuro: grossly normal, moves all extremities Psych: Normal affect and thought content  No results found for this or any previous visit (from the past 72 hour(s)).   Assessment/Plan:  DOE (dyspnea on exertion) Associated with lower extremity swelling, lower extremity swelling is improved.  Getting echo to rule out heart failure.  Hypoalbuminemia state ruled out.  Will get CBC to rule out anemia.  Elevated STOP-BANG score at 5.  Should assess for obstructive sleep apnea at next visit..  Consider sleep study.  May also be related to deconditioning due to smoking and obesity hypoventilation syndrome given morbid obesity.  Swelling of lower extremity Likely venous insufficiency.  Not due to hypovolemic stance is been ruled out.  Getting echo to rule out heart failure.  Will trial compression  stockings.  TOBACCO ABUSE Patient interested in smoking.  Patient to follow-up with Dr. Criss Rosales in 1 month to discuss smoking cessation.  Morbid obesity Patient instructed in nutrition therapy to assist with weight loss.  Will refer.    Lab Orders     CBC  No orders of the defined types were placed in this encounter.     Marny Lowenstein, MD, MS FAMILY MEDICINE RESIDENT - PGY3 04/09/2019 12:34 PM

## 2019-04-10 ENCOUNTER — Telehealth (HOSPITAL_COMMUNITY): Payer: Self-pay | Admitting: Radiology

## 2019-04-10 ENCOUNTER — Other Ambulatory Visit (HOSPITAL_COMMUNITY): Payer: Medicare Other

## 2019-04-10 LAB — CBC
Hematocrit: 46.9 % — ABNORMAL HIGH (ref 34.0–46.6)
Hemoglobin: 15.9 g/dL (ref 11.1–15.9)
MCH: 33.9 pg — ABNORMAL HIGH (ref 26.6–33.0)
MCHC: 33.9 g/dL (ref 31.5–35.7)
MCV: 100 fL — ABNORMAL HIGH (ref 79–97)
Platelets: 253 10*3/uL (ref 150–450)
RBC: 4.69 x10E6/uL (ref 3.77–5.28)
RDW: 11.8 % (ref 11.7–15.4)
WBC: 5.6 10*3/uL (ref 3.4–10.8)

## 2019-04-10 NOTE — Telephone Encounter (Signed)
Left message to call office-Patient needs to schedule an echocardiogram.  

## 2019-04-12 ENCOUNTER — Encounter: Payer: Self-pay | Admitting: Family Medicine

## 2019-04-16 ENCOUNTER — Encounter: Payer: Self-pay | Admitting: Family Medicine

## 2019-04-16 ENCOUNTER — Telehealth: Payer: Self-pay | Admitting: Family Medicine

## 2019-04-16 ENCOUNTER — Other Ambulatory Visit: Payer: Self-pay

## 2019-04-16 ENCOUNTER — Ambulatory Visit (HOSPITAL_COMMUNITY): Payer: Medicare Other | Attending: Cardiovascular Disease

## 2019-04-16 DIAGNOSIS — M7989 Other specified soft tissue disorders: Secondary | ICD-10-CM | POA: Insufficient documentation

## 2019-04-16 DIAGNOSIS — R0609 Other forms of dyspnea: Secondary | ICD-10-CM | POA: Insufficient documentation

## 2019-04-16 DIAGNOSIS — R0600 Dyspnea, unspecified: Secondary | ICD-10-CM

## 2019-04-16 NOTE — Telephone Encounter (Signed)
Patient with normal echocardiogram, ruling out HF as cause of dyspnea w/ LEx swelling. Patient with multiple cardiac risk factors will refer to cardiology for further evaluation. Called patient. No answer. LVM detailing above.

## 2019-04-20 ENCOUNTER — Telehealth: Payer: Self-pay | Admitting: Family Medicine

## 2019-04-20 DIAGNOSIS — K5900 Constipation, unspecified: Secondary | ICD-10-CM

## 2019-04-20 DIAGNOSIS — M545 Low back pain, unspecified: Secondary | ICD-10-CM

## 2019-04-20 DIAGNOSIS — M7989 Other specified soft tissue disorders: Secondary | ICD-10-CM

## 2019-04-20 DIAGNOSIS — G8929 Other chronic pain: Secondary | ICD-10-CM

## 2019-04-20 DIAGNOSIS — K219 Gastro-esophageal reflux disease without esophagitis: Secondary | ICD-10-CM

## 2019-04-20 DIAGNOSIS — R0609 Other forms of dyspnea: Secondary | ICD-10-CM

## 2019-04-20 DIAGNOSIS — E785 Hyperlipidemia, unspecified: Secondary | ICD-10-CM

## 2019-04-20 DIAGNOSIS — F17200 Nicotine dependence, unspecified, uncomplicated: Secondary | ICD-10-CM

## 2019-04-20 DIAGNOSIS — I10 Essential (primary) hypertension: Secondary | ICD-10-CM

## 2019-04-20 MED ORDER — LISINOPRIL 10 MG PO TABS: ORAL_TABLET | ORAL | 0 refills | Status: DC

## 2019-04-20 MED ORDER — ALBUTEROL SULFATE HFA 108 (90 BASE) MCG/ACT IN AERS: INHALATION_SPRAY | RESPIRATORY_TRACT | 3 refills | Status: DC

## 2019-04-20 MED ORDER — ALUM & MAG HYDROXIDE-SIMETH 200-200-20 MG/5ML PO SUSP: 30.0000 mL | Freq: Four times a day (QID) | ORAL | 1 refills | Status: DC | PRN

## 2019-04-20 MED ORDER — IBUPROFEN 800 MG PO TABS: 800.0000 mg | ORAL_TABLET | Freq: Three times a day (TID) | ORAL | 1 refills | Status: DC | PRN

## 2019-04-20 MED ORDER — BUPROPION HCL 75 MG PO TABS: 75.0000 mg | ORAL_TABLET | Freq: Two times a day (BID) | ORAL | 0 refills | Status: DC

## 2019-04-20 MED ORDER — ACETAMINOPHEN ER 650 MG PO TBCR: 650.0000 mg | EXTENDED_RELEASE_TABLET | Freq: Three times a day (TID) | ORAL | 1 refills | Status: DC | PRN

## 2019-04-20 MED ORDER — ATORVASTATIN CALCIUM 40 MG PO TABS: 40.0000 mg | ORAL_TABLET | Freq: Every day | ORAL | 3 refills | Status: DC

## 2019-04-20 MED ORDER — PANTOPRAZOLE SODIUM 40 MG PO TBEC: DELAYED_RELEASE_TABLET | ORAL | 0 refills | Status: DC

## 2019-04-20 MED ORDER — DOCUSATE SODIUM 100 MG PO CAPS: 100.0000 mg | ORAL_CAPSULE | Freq: Two times a day (BID) | ORAL | 1 refills | Status: AC | PRN

## 2019-04-20 MED ORDER — GABAPENTIN 300 MG PO CAPS: ORAL_CAPSULE | ORAL | 5 refills | Status: DC

## 2019-04-20 MED ORDER — TRAMADOL HCL 50 MG PO TABS: ORAL_TABLET | ORAL | 0 refills | Status: DC

## 2019-04-20 MED ORDER — FUROSEMIDE 20 MG PO TABS: 20.0000 mg | ORAL_TABLET | Freq: Every day | ORAL | 1 refills | Status: DC

## 2019-04-20 NOTE — Telephone Encounter (Signed)
Patient needs all of their prescriptions filled.  Thought it had already been requested but it has not.  Pharmacy is [facility] on [address].  If you have any questions, please call [phone]

## 2019-04-20 NOTE — Telephone Encounter (Signed)
Refill of all meds  -Dr. Criss Rosales

## 2019-04-23 ENCOUNTER — Telehealth: Payer: Self-pay | Admitting: Cardiovascular Disease

## 2019-04-23 NOTE — Telephone Encounter (Signed)
New Message   Patient is calling in for the echo results. Please give patient a call back.

## 2019-04-23 NOTE — Telephone Encounter (Signed)
I spoke to Dr Marjory Lies Thompson's office and they will reach out to the patient to give Echo results.

## 2019-04-23 NOTE — Telephone Encounter (Signed)
Legrand Como a nurse from [facility] is calling and said that pt has called their office today to check on her results from the echocardiogram. He was calling to see what she would need to do to receive them. I informed him that Dr. Grandville Silos had called and their was no answer.   Pt was not aware she missed the call and would like for someone to call her with results.

## 2019-04-30 NOTE — Progress Notes (Signed)
Cardiology Office Note:    Date:  05/01/2019   ID:  Kendra [name], DOB 1966/08/22, MRN [id number]  PCP:  Sherene Sires, DO  Cardiologist:  None established - I will follow her going forward along with Dr. Burt Knack   Electrophysiologist:  None   Referring MD: Sherene Sires, DO   Chief Complaint  Patient presents with  . Chest Pain  . Shortness of Breath    History of Present Illness:    Kendra [name] is a 53 y.o. female with:  Chest pain   Coronary artery disease (Ca on CT scan)  Myoview 6/19: Low Risk, no ischemia  Tobacco use  Hyperlipidemia   GERD   [name] was last seen in 10/19.  She was recently seen by primary care for shortness of breath.  An echocardiogram demonstrated normal EF.  She returns for further evaluation.  She has chronic shortness of breath with exertion.  She also notes chronic chest discomfort with exertion.  The symptoms have been going on for couple of years now.  She notes that they seem to be getting worse.  She sleeps on 3 pillows.  She sometimes awakens short of breath.  She has an inhaler to use which sometimes provides relief.  She is not sure if she snores.  She has had some leg swelling which has improved somewhat with Lasix.  She has not had syncope.  She did have an episode of lightheadedness while walking to the store recently.   Prior CV studies:   The following studies were reviewed today:  Echocardiogram 04/16/2019 EF [id number], normal diastolic function, normal RVSF  Myoview 01/29/18  Nuclear stress EF: 60%.  There is a small defect of mild severity present in the mid anteroseptal and apical septal location. The defect is non-reversible and consistent with breast attenuation artifact. No ischemia noted  This is a low risk study.  The left ventricular ejection fraction is normal (55-65%).  There was no ST segment deviation noted during stress.  Chest CTA 12/14/17 IMPRESSION: 1. Negative for acute PE or thoracic aortic  dissection. 2. Atherosclerosis, including coronary artery disease. Please note that although the presence of coronary artery calcium documents the presence of coronary artery disease, the severity of this disease and any potential stenosis cannot be assessed on this non-gated CT examination. Assessment for potential risk factor modification, dietary therapy or pharmacologic therapy may be warranted, if clinically indicated.  Echocardiogram 03/02/2016 Mild LVH, EF 55-60, no RWMA, normal diastolic fn   Past Medical History:  Diagnosis Date  . Anemia   . Asthma   . Esophagitis   . GERD (gastroesophageal reflux disease)   . HTN (hypertension)   . Hyperplastic colon polyp    Surgical Hx: The patient  has a past surgical history that includes Cesarean section; Tubal ligation; Tibia fracture surgery; Ankle surgery (Left); Finger surgery; Cystoscopy (N/A, 04/12/2015); and Abdominal hysterectomy (04/12/15).   Current Medications: Current Meds  Medication Sig  . acetaminophen (TYLENOL 8 HOUR) 650 MG CR tablet Take 1 tablet (650 mg total) by mouth every 8 (eight) hours as needed for pain.  Marland Kitchen albuterol (PROAIR HFA) 108 (90 Base) MCG/ACT inhaler inhale 2 puffs every 4 hours if needed wheezing or shortness of breath  . alum & mag hydroxide-simeth (MAALOX/MYLANTA) 200-200-20 MG/5ML suspension Take 30 mLs by mouth every 6 (six) hours as needed for indigestion or heartburn. Reported on 10/26/2015  . atorvastatin (LIPITOR) 40 MG tablet Take 1 tablet (40 mg total) by mouth daily.  Marland Kitchen  buPROPion (WELLBUTRIN) 75 MG tablet Take 1 tablet (75 mg total) by mouth 2 (two) times daily.  Marland Kitchen docusate sodium (COLACE) 100 MG capsule Take 1 capsule (100 mg total) by mouth 2 (two) times daily as needed for mild constipation.  . furosemide (LASIX) 20 MG tablet Take 1 tablet (20 mg total) by mouth daily.  Marland Kitchen gabapentin (NEURONTIN) 300 MG capsule TAKE 1 CAPSULE(300 MG) BY MOUTH AT BEDTIME  . ibuprofen (ADVIL) 800 MG tablet  Take 1 tablet (800 mg total) by mouth every 8 (eight) hours as needed.  Marland Kitchen lisinopril (ZESTRIL) 10 MG tablet TAKE 1 TABLET(10 MG) BY MOUTH DAILY  . pantoprazole (PROTONIX) 40 MG tablet TAKE 1 TABLET BY MOUTH TWICE DAILY BEFORE A MEAL  . traMADol (ULTRAM) 50 MG tablet Take 1 tablet by mouth every 12 hours if needed.     Allergies:   Lisinopril   Social History   Tobacco Use  . Smoking status: Current Every Day Smoker    Packs/day: 1.50    Types: Cigarettes    Start date: 08/27/2002  . Smokeless tobacco: Never Used  . Tobacco comment: cut back to 4-5 cigs per day from 1ppd  Substance Use Topics  . Alcohol use: Yes    Alcohol/week: 0.0 standard drinks    Comment: 2 -40oz daily,  24oz daily.    . Drug use: No     Family Hx: The patient's family history includes Diabetes in her mother and sister; Heart disease in her mother; Lung cancer (age of onset: 19) in her father. There is no history of Breast cancer, Stomach cancer, Colon cancer, or Pancreatic cancer.  ROS:   Please see the history of present illness.    Review of Systems  Gastrointestinal: Negative for diarrhea, hematochezia, melena and vomiting.  Genitourinary: Negative for hematuria.   All other systems reviewed and are negative.   EKGs/Labs/Other Test Reviewed:    EKG:  EKG is  ordered today.  The ekg ordered today demonstrates normal sinus rhythm, heart rate 89, normal axis, QTC 442, no acute ST-T wave changes  Recent Labs: 06/02/2018: TSH 1.060 03/24/2019: ALT 17; BUN 11; Creatinine, Ser 0.79; Potassium 4.3; Sodium 142 04/09/2019: Hemoglobin 15.9; Platelets 253   Recent Lipid Panel Lab Results  Component Value Date/Time   CHOL 175 02/18/2017 02:18 PM   TRIG 239 (H) 02/18/2017 02:18 PM   HDL 49 02/18/2017 02:18 PM   CHOLHDL 3.6 02/18/2017 02:18 PM   CHOLHDL 3.0 02/22/2016 03:38 PM   LDLCALC 78 02/18/2017 02:18 PM    Physical Exam:    VS:  BP 118/78   Pulse 89   Ht 5\' 4"  (1.626 m)   Wt 268 lb 6.4 oz (121.7  kg)   LMP 02/21/2015   SpO2 98%   BMI 46.07 kg/m     Wt Readings from Last 3 Encounters:  05/01/19 268 lb 6.4 oz (121.7 kg)  04/09/19 268 lb (121.6 kg)  01/26/19 271 lb 9.6 oz (123.2 kg)     Physical Exam  Constitutional: She is oriented to person, place, and time. She appears well-developed and well-nourished. No distress.  HENT:  Head: Normocephalic and atraumatic.  Eyes: No scleral icterus.  Neck: No JVD present. Carotid bruit is not present. No thyromegaly present.  Cardiovascular: Normal rate, regular rhythm and normal heart sounds.  No murmur heard. Pulmonary/Chest: Effort normal and breath sounds normal. She has no rales.  Abdominal: Soft. There is no hepatomegaly.  Musculoskeletal:  General: Edema (trace bilat pretibial edema) present.  Lymphadenopathy:    She has no cervical adenopathy.  Neurological: She is alert and oriented to person, place, and time.  Skin: Skin is warm and dry.  Psychiatric: She has a normal mood and affect.    ASSESSMENT & PLAN:    1. Coronary artery calcification seen on CT scan 2. Other chest pain 3. Shortness of breath She has had symptoms of chest pain and shortness of breath for years.  It seems to be getting worse.  Her ECG does not demonstrate any acute changes.  She does have a history of coronary calcification on prior CT scan.  Nuclear stress test in June 2019 demonstrated no ischemia.  Recent echocardiogram demonstrated normal LV function.  I have recommended proceeding with coronary CTA to definitively rule out obstructive coronary artery disease.  I will also obtain a BMET, BNP today.  She can follow-up with me in 1 month.  -Coronary CTA  -BMET, BNP  -Increase Lasix if BNP high  4. Hyperlipidemia, unspecified hyperlipidemia type Continue statin therapy.  5. Tobacco abuse Cessation recommended.  I tried to stress the importance of quitting.   Dispo:  Return in about 1 month (around 05/31/2019) for Follow up after  testing, w/ Richardson Dopp, PA-C.   Medication Adjustments/Labs and Tests Ordered: Current medicines are reviewed at length with the patient today.  Concerns regarding medicines are outlined above.  Tests Ordered: Orders Placed This Encounter  Procedures  . CT CORONARY MORPH W/CTA COR W/SCORE W/CA W/CM &/OR WO/CM  . CT CORONARY FRACTIONAL FLOW RESERVE DATA PREP  . CT CORONARY FRACTIONAL FLOW RESERVE FLUID ANALYSIS  . Pro b natriuretic peptide  . Basic Metabolic Panel (BMET)  . EKG 12-Lead   Medication Changes: Meds ordered this encounter  Medications  . metoprolol tartrate (LOPRESSOR) 100 MG tablet    Sig: One time only two hours prior to test.    Dispense:  1 tablet    Refill:  0    Signed, Richardson Dopp, PA-C  05/01/2019 12:56 PM    [facility] [place], [place], [place]  [address] Phone: [phone]; Fax: [phone]

## 2019-05-01 ENCOUNTER — Other Ambulatory Visit: Payer: Self-pay

## 2019-05-01 ENCOUNTER — Encounter: Payer: Self-pay | Admitting: Physician Assistant

## 2019-05-01 ENCOUNTER — Ambulatory Visit (INDEPENDENT_AMBULATORY_CARE_PROVIDER_SITE_OTHER): Payer: Medicare Other | Admitting: Physician Assistant

## 2019-05-01 VITALS — BP 118/78 | HR 89 | Ht 64.0 in | Wt 268.4 lb

## 2019-05-01 DIAGNOSIS — I2510 Atherosclerotic heart disease of native coronary artery without angina pectoris: Secondary | ICD-10-CM | POA: Diagnosis not present

## 2019-05-01 DIAGNOSIS — R0602 Shortness of breath: Secondary | ICD-10-CM | POA: Diagnosis not present

## 2019-05-01 DIAGNOSIS — R0789 Other chest pain: Secondary | ICD-10-CM

## 2019-05-01 DIAGNOSIS — E785 Hyperlipidemia, unspecified: Secondary | ICD-10-CM | POA: Diagnosis not present

## 2019-05-01 DIAGNOSIS — Z720 Tobacco use: Secondary | ICD-10-CM

## 2019-05-01 DIAGNOSIS — R079 Chest pain, unspecified: Secondary | ICD-10-CM

## 2019-05-01 MED ORDER — METOPROLOL TARTRATE 100 MG PO TABS: ORAL_TABLET | ORAL | 0 refills | Status: DC

## 2019-05-01 NOTE — Patient Instructions (Addendum)
Medication Instructions:  Take a (81 mg ) tablet of Asprin today    If you need a refill on your cardiac medications before your next appointment, please call your pharmacy.   Lab work: Your physician recommends that you have lab work bmet/bnp  If you have labs (blood work) drawn today and your tests are completely normal, you will receive your results only by: Marland Kitchen MyChart Message (if you have MyChart) OR . A paper copy in the mail If you have any lab test that is abnormal or we need to change your treatment, we will call you to review the results.  Testing/Procedures: Your cardiac CT will be scheduled at one of the below locations:   [facility] [address] [place], [place] [address] (336) [place] [address] [place], [place] [address] [phone]  Please arrive at the [facility] main entrance of [facility] 30-45 minutes prior to test start time. Proceed to the [facility] Radiology Department (first floor) to check-in and test prep.  Please follow these instructions carefully (unless otherwise directed):  Hold all erectile dysfunction medications at least 48 hours prior to test.  On the Night Before the Test: . Be sure to Drink plenty of water. . Do not consume any caffeinated/decaffeinated beverages or chocolate 12 hours prior to your test. . Do not take any antihistamines 12 hours prior to your test. . If the patient has contrast allergy  On the Day of the Test: . Drink plenty of water. Do not drink any water within one hour of the test. . Do not eat any food 4 hours prior to the test. . You may take your regular medications prior to the test.  . Take metoprolol (Lopressor) two hours prior to test. . HOLD Furosemide . FEMALES- please wear underwire-free bra if available        After the Test: . Drink plenty of water. . After receiving IV contrast, you may experience a  mild flushed feeling. This is normal. . On occasion, you may experience a mild rash up to 24 hours after the test. This is not dangerous. If this occurs, you can take Benadryl 25 mg and increase your fluid intake. . If you experience trouble breathing, this can be serious. If it is severe call 911 IMMEDIATELY. If it is mild, please call our office.     Please contact the cardiac imaging nurse navigator should you have any questions/concerns Marchia Bond, RN Navigator Cardiac Imaging [place] [facility] [phone] Office  [phone] Cell   .   Follow-Up: Your physician recommends that you keep your scheduled  follow-up appointment  With Richardson Dopp in October.    Any Other Special Instructions Will Be Listed Below (If Applicable).

## 2019-05-05 LAB — BASIC METABOLIC PANEL
BUN/Creatinine Ratio: 19 (ref 9–23)
BUN: 17 mg/dL (ref 6–24)
CO2: 19 mmol/L — ABNORMAL LOW (ref 20–29)
Calcium: 10.6 mg/dL — ABNORMAL HIGH (ref 8.7–10.2)
Chloride: 105 mmol/L (ref 96–106)
Creatinine, Ser: 0.88 mg/dL (ref 0.57–1.00)
GFR calc Af Amer: 87 mL/min/{1.73_m2} (ref >59)
GFR calc non Af Amer: 76 mL/min/{1.73_m2} (ref >59)
Glucose: 99 mg/dL (ref 65–99)
Potassium: 4.6 mmol/L (ref 3.5–5.2)
Sodium: 140 mmol/L (ref 134–144)

## 2019-05-05 LAB — PRO B NATRIURETIC PEPTIDE: NT-Pro BNP: 43 pg/mL (ref 0–249)

## 2019-05-19 ENCOUNTER — Ambulatory Visit: Payer: Medicare Other | Admitting: Dietician

## 2019-05-21 ENCOUNTER — Other Ambulatory Visit: Payer: Self-pay | Admitting: Family Medicine

## 2019-05-21 DIAGNOSIS — Z1231 Encounter for screening mammogram for malignant neoplasm of breast: Secondary | ICD-10-CM

## 2019-06-03 ENCOUNTER — Other Ambulatory Visit: Payer: Self-pay | Admitting: Family Medicine

## 2019-06-03 DIAGNOSIS — F17200 Nicotine dependence, unspecified, uncomplicated: Secondary | ICD-10-CM

## 2019-06-04 ENCOUNTER — Telehealth (HOSPITAL_COMMUNITY): Payer: Self-pay | Admitting: Emergency Medicine

## 2019-06-04 NOTE — Telephone Encounter (Signed)
Left message on voicemail with name and callback number Daymian Lill RN Navigator Cardiac Imaging [facility] [phone] Office [phone] Cell  

## 2019-06-05 ENCOUNTER — Encounter: Payer: Medicare Other | Admitting: *Deleted

## 2019-06-05 ENCOUNTER — Other Ambulatory Visit: Payer: Self-pay

## 2019-06-05 ENCOUNTER — Ambulatory Visit (HOSPITAL_COMMUNITY)
Admission: RE | Admit: 2019-06-05 | Discharge: 2019-06-05 | Disposition: A | Discharge status: Home / Self Care | Payer: Medicare Other | Source: Ambulatory Visit | Attending: Physician Assistant | Admitting: Physician Assistant

## 2019-06-05 DIAGNOSIS — R0789 Other chest pain: Secondary | ICD-10-CM | POA: Diagnosis present

## 2019-06-05 DIAGNOSIS — E785 Hyperlipidemia, unspecified: Secondary | ICD-10-CM

## 2019-06-05 DIAGNOSIS — R079 Chest pain, unspecified: Secondary | ICD-10-CM | POA: Diagnosis present

## 2019-06-05 DIAGNOSIS — R0602 Shortness of breath: Secondary | ICD-10-CM | POA: Insufficient documentation

## 2019-06-05 DIAGNOSIS — Z720 Tobacco use: Secondary | ICD-10-CM | POA: Diagnosis present

## 2019-06-05 DIAGNOSIS — I2510 Atherosclerotic heart disease of native coronary artery without angina pectoris: Secondary | ICD-10-CM | POA: Diagnosis not present

## 2019-06-05 DIAGNOSIS — Z006 Encounter for examination for normal comparison and control in clinical research program: Secondary | ICD-10-CM

## 2019-06-05 MED ORDER — NITROGLYCERIN 0.4 MG SL SUBL
SUBLINGUAL_TABLET | SUBLINGUAL | Status: AC
  Filled 2019-06-05: qty 2

## 2019-06-05 MED ORDER — IOHEXOL 350 MG/ML SOLN
100.0000 mL | Freq: Once | INTRAVENOUS | Status: AC | PRN
  Administered 2019-06-05: 16:00:00 100 mL via INTRAVENOUS

## 2019-06-05 MED ORDER — NITROGLYCERIN 0.4 MG SL SUBL
0.8000 mg | SUBLINGUAL_TABLET | SUBLINGUAL | Status: DC | PRN
  Administered 2019-06-05: 0.8 mg via SUBLINGUAL
  Filled 2019-06-05 (×2): qty 25

## 2019-06-05 NOTE — Research (Signed)
CADFEM Informed Consent                  Subject Name:   Kendra [name]   Subject met inclusion and exclusion criteria.  The informed consent form, study requirements and expectations were reviewed with the subject and questions and concerns were addressed prior to the signing of the consent form.  The subject verbalized understanding of the trial requirements.  The subject agreed to participate in the CADFEM trial and signed the informed consent.  The informed consent was obtained prior to performance of any protocol-specific procedures for the subject.  A copy of the signed informed consent was given to the subject and a copy was placed in the subject's medical record.   [place] Chalmers, Research Assistant 06/05/2019 14:33 p.m

## 2019-06-08 ENCOUNTER — Encounter: Payer: Self-pay | Admitting: Physician Assistant

## 2019-06-08 ENCOUNTER — Other Ambulatory Visit: Payer: Self-pay | Admitting: *Deleted

## 2019-06-08 DIAGNOSIS — E785 Hyperlipidemia, unspecified: Secondary | ICD-10-CM

## 2019-06-08 DIAGNOSIS — I2510 Atherosclerotic heart disease of native coronary artery without angina pectoris: Secondary | ICD-10-CM | POA: Insufficient documentation

## 2019-06-08 MED ORDER — ASPIRIN EC 81 MG PO TBEC: 81.0000 mg | DELAYED_RELEASE_TABLET | Freq: Every day | ORAL | 1 refills | Status: DC

## 2019-06-08 MED ORDER — ATORVASTATIN CALCIUM 80 MG PO TABS: 80.0000 mg | ORAL_TABLET | Freq: Every day | ORAL | 3 refills | Status: DC

## 2019-06-23 ENCOUNTER — Ambulatory Visit (INDEPENDENT_AMBULATORY_CARE_PROVIDER_SITE_OTHER): Payer: Medicare Other | Admitting: Physician Assistant

## 2019-06-23 ENCOUNTER — Other Ambulatory Visit: Payer: Self-pay

## 2019-06-23 ENCOUNTER — Encounter: Payer: Self-pay | Admitting: Physician Assistant

## 2019-06-23 VITALS — BP 112/74 | HR 94 | Ht 64.0 in | Wt 272.0 lb

## 2019-06-23 DIAGNOSIS — I2510 Atherosclerotic heart disease of native coronary artery without angina pectoris: Secondary | ICD-10-CM | POA: Diagnosis not present

## 2019-06-23 DIAGNOSIS — Z720 Tobacco use: Secondary | ICD-10-CM

## 2019-06-23 DIAGNOSIS — E785 Hyperlipidemia, unspecified: Secondary | ICD-10-CM

## 2019-06-23 NOTE — Patient Instructions (Signed)
Medication Instructions:  Your physician recommends that you continue on your current medications as directed. Please refer to the Current Medication list given to you today.  *If you need a refill on your cardiac medications before your next appointment, please call your pharmacy*  Lab Work: None If you have labs (blood work) drawn today and your tests are completely normal, you will receive your results only by: Marland Kitchen MyChart Message (if you have MyChart) OR . A paper copy in the mail If you have any lab test that is abnormal or we need to change your treatment, we will call you to review the results.  Testing/Procedures: None  Follow-Up: At [facility], you and your health needs are our priority.  As part of our continuing mission to provide you with exceptional heart care, we have created designated Provider Care Teams.  These Care Teams include your primary Cardiologist (physician) and Advanced Practice Providers (APPs -  Physician Assistants and Nurse Practitioners) who all work together to provide you with the care you need, when you need it.  Your next appointment:   6 months  The format for your next appointment:   In Person  Provider:   You may see Dr. Sherren Mocha or one of the following Advanced Practice Providers on your designated Care Team:    Richardson Dopp, PA-C  [place], [place]  Daune Perch, NP   Other Instructions

## 2019-06-23 NOTE — Progress Notes (Signed)
Cardiology Office Note:    Date:  06/23/2019   ID:  Kendra [name], DOB 1965/08/28, MRN [id number]  PCP:  Sherene Sires, DO  Cardiologist:  Sherren Mocha, MD / Richardson Dopp, PA-C  Electrophysiologist:  None   Referring MD: Sherene Sires, DO   Chief Complaint  Patient presents with  . Follow-up    CAD    History of Present Illness:    Kendra [name] is a 53 y.o. female with:   Chest pain   Coronary artery disease (Ca on CT scan) ? Myoview 6/19: Low Risk, no ischemia  Tobacco use  Hyperlipidemia   GERD  Kendra [name] was seen in September 2020 with complaints of chest pain and shortness of breath.  BNP was normal at 43.  Coronary CTA demonstrated a calcium score of 81.5 representing the 96th percentile for age and sex matched control.  Kendra [name] had mild nonobstructive coronary artery disease with mild mixed attenuation plaque in the ostium of the LAD and minimal disease in the distal left main and RCA.  Aggressive RF modification was recommended.    Kendra [name] returns for follow up.  We reviewed Kendra [name] Coronary CTA today.  I have recommended aggressive risk factor modification.  We discussed the importance of good blood pressure control, aggressive lipid management, quitting smoking, diet, exercise and weight loss.  Kendra [name] continues to have chronic chest pain and shortness of breath.  Kendra [name] uses Kendra [name] inhalers with relief.    Prior CV studies:   The following studies were reviewed today:  Coronary CTA 06/05/2019 Coronary Arteries:  Normal coronary origin.  Right dominance.  RCA is a large dominant artery that gives rise to PDA and PLVB. There is minimal (<25%) calcified plaque in the mid RCA.  Left main is a large artery that gives rise to LAD, LCX, an RI arteries. There is minimal (<25%) mixed attenuation plaque distally.  LAD is a large vessel. There is mild (25-49%) mixed attenuation plaque at the ostium and minimal (<25%) calcified plaque in the proximal to mid LAD. There is one small  diagonal without plaque.  LCX is a non-dominant artery that gives rise to a small OM1, small OM2 and large OM3. There is no plaque.  RI is a large vessel with no plaque proximally. It is poorly visualized after the mid vessel.  Other findings:  Normal pulmonary vein drainage into the left atrium.  Normal let atrial appendage without a thrombus.  Normal size of the pulmonary artery.  IMPRESSION: 1. Coronary calcium score of 81.5. This was 39 percentile for age and sex matched control.  2. Normal coronary origin with right dominance.  3. Mild mixed attenuation plaque in the ostium of LAD. Otherwise minimal (<25%) disease in the distal left main and RCA.  4. Recommend aggressive risk factor modification and high-potency Statin.  Echocardiogram 04/16/2019 EF [id number], normal diastolic function, normal RVSF  Myoview 01/29/18  Nuclear stress EF: 60%.  There is a small defect of mild severity present in the mid anteroseptal and apical septal location. The defect is non-reversible and consistent with breast attenuation artifact. No ischemia noted  This is a low risk study.  The left ventricular ejection fraction is normal (55-65%).  There was no ST segment deviation noted during stress.  Chest CTA 12/14/17 IMPRESSION: 1. Negative for acute PE or thoracic aortic dissection. 2. Atherosclerosis, including coronary artery disease. Please note that although the presence of coronary artery calcium documents the presence of coronary artery disease, the severity of this disease  and any potential stenosis cannot be assessed on this non-gated CT examination. Assessment for potential risk factor modification, dietary therapy or pharmacologic therapy may be warranted, if clinically indicated.  Echocardiogram 03/02/2016 Mild LVH, EF 55-60, no RWMA, normal diastolic fn  Past Medical History:  Diagnosis Date  . Anemia   . Asthma   . Esophagitis   . GERD (gastroesophageal  reflux disease)   . HTN (hypertension)   . Hyperplastic colon polyp   . 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    Cor CTA 05/2019: Ca score 81.5 (96th percentile); mRCA < 25; dLM < 25; oLAD 25-49, pLAD < 25   Surgical Hx: The patient  has a past surgical history that includes Cesarean section; Tubal ligation; Tibia fracture surgery; Ankle surgery (Left); Finger surgery; Cystoscopy (N/A, 04/12/2015); and Abdominal hysterectomy (04/12/15).   Current Medications: Current Meds  Medication Sig  . acetaminophen (TYLENOL 8 HOUR) 650 MG CR tablet Take 1 tablet (650 mg total) by mouth every 8 (eight) hours as needed for pain.  Marland Kitchen albuterol (PROAIR HFA) 108 (90 Base) MCG/ACT inhaler inhale 2 puffs every 4 hours if needed wheezing or shortness of breath  . alum & mag hydroxide-simeth (MAALOX/MYLANTA) 200-200-20 MG/5ML suspension Take 30 mLs by mouth every 6 (six) hours as needed for indigestion or heartburn. Reported on 10/26/2015  . aspirin EC 81 MG tablet Take 1 tablet (81 mg total) by mouth daily.  Marland Kitchen atorvastatin (LIPITOR) 80 MG tablet Take 1 tablet (80 mg total) by mouth daily.  Marland Kitchen buPROPion (WELLBUTRIN) 75 MG tablet TAKE 1 TABLET(75 MG) BY MOUTH TWICE DAILY  . docusate sodium (COLACE) 100 MG capsule Take 1 capsule (100 mg total) by mouth 2 (two) times daily as needed for mild constipation.  . furosemide (LASIX) 20 MG tablet Take 1 tablet (20 mg total) by mouth daily.  Marland Kitchen gabapentin (NEURONTIN) 300 MG capsule TAKE 1 CAPSULE(300 MG) BY MOUTH AT BEDTIME  . ibuprofen (ADVIL) 800 MG tablet Take 1 tablet (800 mg total) by mouth every 8 (eight) hours as needed.  Marland Kitchen lisinopril (ZESTRIL) 10 MG tablet TAKE 1 TABLET(10 MG) BY MOUTH DAILY  . metoprolol tartrate (LOPRESSOR) 100 MG tablet One time only two hours prior to test.  . pantoprazole (PROTONIX) 40 MG tablet TAKE 1 TABLET BY MOUTH TWICE DAILY BEFORE A MEAL  . traMADol (ULTRAM) 50 MG tablet Take 1 tablet by mouth every 12 hours if needed.     Allergies:    Lisinopril   Social History   Tobacco Use  . Smoking status: Current Every Day Smoker    Packs/day: 1.50    Types: Cigarettes    Start date: 08/27/2002  . Smokeless tobacco: Never Used  . Tobacco comment: cut back to 4-5 cigs per day from 1ppd  Substance Use Topics  . Alcohol use: Yes    Alcohol/week: 0.0 standard drinks    Comment: 2 -40oz daily,  24oz daily.    . Drug use: No     Family Hx: The patient's family history includes Diabetes in Kendra [name] mother and sister; Heart disease in Kendra [name] mother; Lung cancer (age of onset: 13) in Kendra [name] father. There is no history of Breast cancer, Stomach cancer, Colon cancer, or Pancreatic cancer.  ROS:   Please see the history of present illness.    ROS All other systems reviewed and are negative.   EKGs/Labs/Other Test Reviewed:    EKG:  EKG is not ordered today.  The ekg ordered today demonstrates n/a  Recent  Labs: 03/24/2019: ALT 17 04/09/2019: Hemoglobin 15.9; Platelets 253 05/01/2019: BUN 17; Creatinine, Ser 0.88; NT-Pro BNP 43; Potassium 4.6; Sodium 140   Recent Lipid Panel Lab Results  Component Value Date/Time   CHOL 175 02/18/2017 02:18 PM   TRIG 239 (H) 02/18/2017 02:18 PM   HDL 49 02/18/2017 02:18 PM   CHOLHDL 3.6 02/18/2017 02:18 PM   CHOLHDL 3.0 02/22/2016 03:38 PM   LDLCALC 78 02/18/2017 02:18 PM    Physical Exam:    VS:  BP 112/74   Pulse 94   Ht 5\' 4"  (1.626 m)   Wt 272 lb (123.4 kg)   LMP 02/21/2015   SpO2 96%   BMI 46.69 kg/m     Wt Readings from Last 3 Encounters:  06/23/19 272 lb (123.4 kg)  05/01/19 268 lb 6.4 oz (121.7 kg)  04/09/19 268 lb (121.6 kg)     Physical Exam  Constitutional: Kendra [name] is oriented to person, place, and time. Kendra [name] appears well-developed and well-nourished. No distress.  HENT:  Head: Normocephalic and atraumatic.  Neck: Neck supple. No JVD present.  Cardiovascular: Normal rate, regular rhythm, S1 normal, S2 normal and normal heart sounds.  No murmur heard. Pulmonary/Chest: Effort  normal. Kendra [name] has no rales.  Abdominal: Soft. There is no hepatomegaly.  Musculoskeletal:        General: No edema.  Neurological: Kendra [name] is alert and oriented to person, place, and time.  Skin: Skin is warm and dry.    ASSESSMENT & PLAN:    1. Coronary artery disease involving native coronary artery of native heart without angina pectoris Mild non-obstructive CAD by recent coronary CTA.  Kendra [name] has chronic chest pain and shortness of breath. I suspect this is related to COPD from ongoing smoking.  I have recommended that Kendra [name] quit smoking.  Kendra [name] should follow up with primary care for possible referral to Pulmonology.  Kendra [name] BP is well controlled.  Kendra [name] does not have diabetes.  We discussed the importance of diet, exercise and weight loss.  Continue ASA, high dose statin.  FU with me in 6 mos.    2. Hyperlipidemia, unspecified hyperlipidemia type Continue high dose statin.  Kendra [name] has follow up labs pending in 08/2019.    3. Tobacco abuse We discussed the importance of quitting smoking.      Dispo:  Return in about 6 months (around 12/22/2019) for Routine Follow Up, w/ Richardson Dopp, PA-C, (virtual or in-person).   Medication Adjustments/Labs and Tests Ordered: Current medicines are reviewed at length with the patient today.  Concerns regarding medicines are outlined above.  Tests Ordered: No orders of the defined types were placed in this encounter.  Medication Changes: No orders of the defined types were placed in this encounter.   Signed, Richardson Dopp, PA-C  06/23/2019 4:48 PM    [facility] [address], [place], [place]  [address] Phone: [phone]; Fax: [phone]

## 2019-07-09 ENCOUNTER — Ambulatory Visit
Admission: RE | Admit: 2019-07-09 | Discharge: 2019-07-09 | Disposition: A | Discharge status: Home / Self Care | Payer: Medicare Other | Source: Ambulatory Visit | Attending: Family Medicine | Admitting: Family Medicine

## 2019-07-09 ENCOUNTER — Other Ambulatory Visit: Payer: Self-pay

## 2019-07-09 DIAGNOSIS — Z1231 Encounter for screening mammogram for malignant neoplasm of breast: Secondary | ICD-10-CM

## 2019-07-17 ENCOUNTER — Other Ambulatory Visit: Payer: Self-pay | Admitting: *Deleted

## 2019-07-17 DIAGNOSIS — F17200 Nicotine dependence, unspecified, uncomplicated: Secondary | ICD-10-CM

## 2019-07-18 MED ORDER — BUPROPION HCL 75 MG PO TABS: 75.0000 mg | ORAL_TABLET | Freq: Two times a day (BID) | ORAL | 0 refills | Status: DC

## 2019-08-04 ENCOUNTER — Other Ambulatory Visit: Payer: Self-pay | Admitting: *Deleted

## 2019-08-04 DIAGNOSIS — I10 Essential (primary) hypertension: Secondary | ICD-10-CM

## 2019-08-04 MED ORDER — LISINOPRIL 10 MG PO TABS: ORAL_TABLET | ORAL | 0 refills | Status: DC

## 2019-08-24 ENCOUNTER — Other Ambulatory Visit: Payer: Self-pay

## 2019-08-24 DIAGNOSIS — F17200 Nicotine dependence, unspecified, uncomplicated: Secondary | ICD-10-CM

## 2019-08-24 MED ORDER — BUPROPION HCL 75 MG PO TABS: 75.0000 mg | ORAL_TABLET | Freq: Two times a day (BID) | ORAL | 0 refills | Status: DC

## 2019-09-09 ENCOUNTER — Other Ambulatory Visit: Payer: Medicare Other | Admitting: *Deleted

## 2019-09-09 ENCOUNTER — Other Ambulatory Visit: Payer: Self-pay

## 2019-09-09 ENCOUNTER — Encounter (INDEPENDENT_AMBULATORY_CARE_PROVIDER_SITE_OTHER): Payer: Self-pay

## 2019-09-09 DIAGNOSIS — E785 Hyperlipidemia, unspecified: Secondary | ICD-10-CM

## 2019-09-09 DIAGNOSIS — I2510 Atherosclerotic heart disease of native coronary artery without angina pectoris: Secondary | ICD-10-CM

## 2019-09-10 ENCOUNTER — Telehealth: Payer: Self-pay

## 2019-09-10 DIAGNOSIS — E785 Hyperlipidemia, unspecified: Secondary | ICD-10-CM

## 2019-09-10 DIAGNOSIS — I2510 Atherosclerotic heart disease of native coronary artery without angina pectoris: Secondary | ICD-10-CM

## 2019-09-10 LAB — HEPATIC FUNCTION PANEL
ALT: 20 IU/L (ref 0–32)
AST: 19 IU/L (ref 0–40)
Albumin: 4.5 g/dL (ref 3.8–4.9)
Alkaline Phosphatase: 105 IU/L (ref 39–117)
Bilirubin Total: 0.4 mg/dL (ref 0.0–1.2)
Bilirubin, Direct: 0.11 mg/dL (ref 0.00–0.40)
Total Protein: 6.6 g/dL (ref 6.0–8.5)

## 2019-09-10 LAB — LIPID PANEL
Chol/HDL Ratio: 3.5 ratio (ref 0.0–4.4)
Cholesterol, Total: 187 mg/dL (ref 100–199)
HDL: 53 mg/dL (ref >39)
LDL Chol Calc (NIH): 95 mg/dL (ref 0–99)
Triglycerides: 234 mg/dL — ABNORMAL HIGH (ref 0–149)
VLDL Cholesterol Cal: 39 mg/dL (ref 5–40)

## 2019-09-10 MED ORDER — ROSUVASTATIN CALCIUM 40 MG PO TABS: 40.0000 mg | ORAL_TABLET | Freq: Every day | ORAL | 3 refills | Status: DC

## 2019-09-10 NOTE — Telephone Encounter (Signed)
The patient has been notified of the result and verbalized understanding.  All questions (if any) were answered. Patient will stop Atorvastatin and start Rosuvastatin once a day as well as Fish Oil 1000MG  twice a day. Patient will come back for repeat labs on 12/14/19. Mady Haagensen, [place] 09/10/2019 2:08 PM

## 2019-09-23 ENCOUNTER — Other Ambulatory Visit: Payer: Self-pay | Admitting: *Deleted

## 2019-09-23 DIAGNOSIS — F17200 Nicotine dependence, unspecified, uncomplicated: Secondary | ICD-10-CM

## 2019-09-24 MED ORDER — BUPROPION HCL 75 MG PO TABS: 75.0000 mg | ORAL_TABLET | Freq: Two times a day (BID) | ORAL | 0 refills | Status: DC

## 2019-10-23 ENCOUNTER — Telehealth: Payer: Self-pay

## 2019-10-23 NOTE — Telephone Encounter (Signed)
Kendra Hurter, FNP with [facility] calls nurse line regarding annual visit. NP reports that patien's A1c is slightly elevated at 6.2.   Please advise if patient needs to be seen for follow up on lab.   NP can be reached at [phone] with any questions.  To PCP  Talbot Grumbling, RN

## 2019-10-27 ENCOUNTER — Ambulatory Visit (INDEPENDENT_AMBULATORY_CARE_PROVIDER_SITE_OTHER): Payer: Medicare Other | Admitting: Sports Medicine

## 2019-10-27 ENCOUNTER — Other Ambulatory Visit: Payer: Self-pay

## 2019-10-27 VITALS — BP 110/74 | Ht 64.0 in | Wt 278.0 lb

## 2019-10-27 DIAGNOSIS — G8929 Other chronic pain: Secondary | ICD-10-CM

## 2019-10-27 DIAGNOSIS — M25562 Pain in left knee: Secondary | ICD-10-CM | POA: Diagnosis not present

## 2019-10-27 DIAGNOSIS — M25561 Pain in right knee: Secondary | ICD-10-CM

## 2019-10-27 DIAGNOSIS — M545 Low back pain: Secondary | ICD-10-CM

## 2019-10-27 MED ORDER — METHYLPREDNISOLONE ACETATE 40 MG/ML IJ SUSP
40.0000 mg | Freq: Once | INTRAMUSCULAR | Status: AC
  Administered 2019-10-27: 16:00:00 40 mg via INTRA_ARTICULAR

## 2019-10-27 MED ORDER — TRAMADOL HCL 50 MG PO TABS: ORAL_TABLET | ORAL | 0 refills | Status: DC

## 2019-10-27 NOTE — Patient Instructions (Addendum)
The two doctors you can look into to discuss getting a knee replacement are   Dr. Mayer Camel with [facility]  [phone]   Dr. Wynelle Link with [facility] [phone]

## 2019-10-28 ENCOUNTER — Encounter: Payer: Self-pay | Admitting: Sports Medicine

## 2019-10-28 NOTE — Progress Notes (Addendum)
   Subjective:    Patient ID: Kendra [name], female    DOB: [birth year], 54 y.o.   MRN: [id number]  HPI chief complaint: Bilateral knee pain  Kendra [name] comes in today complaining of bilateral knee pain, left greater than right. She has a documented history of osteoarthritis in both knees. X-rays done in 2018 showed approaching bone-on-bone medial compartmental DJD. She has had intermittent cortisone injections in the past with some success. She was also seen by Dr. Mardelle Matte to discuss merits of total knee arthroplasty but her young age and obesity precluded her from proceeding. She is describing diffuse pain in the left knee as well as locking, catching and painful popping. Popping is most noticeable when the knee is in deep flexion or extension. She is not noticed any swelling. She denies any recent trauma. Pain is a little different than what she is experienced in the past. Although she is experiencing similar pain in the right knee, it is not as severe.  Interim medical history reviewed Medications reviewed Allergies reviewed    Review of Systems    As above Objective:   Physical Exam  Well-developed, well-nourished. No acute distress. Sitting comfortably in the exam room.  Examination of both knees shows range of motion from 0 to 120 degrees. She has 1+ boggy synovitis but no appreciable effusion. She is tender to palpation along the medial joint lines bilaterally with pain but no popping with McMurray's. Knees are grossly stable ligamentous exam. She is neurovascularly intact distally. Walks with an antalgic gait.      Assessment & Plan:   Bilateral knee pain, left greater than right, secondary to advanced DJD  Patient's left knee was injected with cortisone today. An anterior lateral approach was utilized. She tolerates this without difficulty. I would like to get updated x-rays of her knees. Definitive treatment is total knee arthroplasty. I did discuss the possibility of a future  referral to either Dr. Mayer Camel or Dr. Wynelle Link but she understands that she will have to lose enough weight to get her BMI below 40 before surgery is considered. I will also refill her tramadol. She is taking it responsibly. Follow-up with me as needed.  Consent obtained and verified. Time-out conducted. Noted no overlying erythema, induration, or other signs of local infection. Skin prepped in a sterile fashion. Topical analgesic spray: Ethyl chloride. Joint: left knee Needle: 25g 1.5 inch Completed without difficulty. Meds: 3cc 1% xylocaine, 1cc (40mg ) depomedrol  Advised to call if fevers/chills, erythema, induration, drainage, or persistent bleeding.

## 2019-10-30 ENCOUNTER — Other Ambulatory Visit: Payer: Self-pay | Admitting: Family Medicine

## 2019-10-30 DIAGNOSIS — M7989 Other specified soft tissue disorders: Secondary | ICD-10-CM

## 2019-11-09 ENCOUNTER — Ambulatory Visit
Admission: RE | Admit: 2019-11-09 | Discharge: 2019-11-09 | Disposition: A | Discharge status: Home / Self Care | Payer: Medicare Other | Source: Ambulatory Visit | Attending: Sports Medicine | Admitting: Sports Medicine

## 2019-11-09 ENCOUNTER — Other Ambulatory Visit: Payer: Self-pay

## 2019-11-09 DIAGNOSIS — G8929 Other chronic pain: Secondary | ICD-10-CM

## 2019-11-09 DIAGNOSIS — M1711 Unilateral primary osteoarthritis, right knee: Secondary | ICD-10-CM | POA: Diagnosis not present

## 2019-11-09 DIAGNOSIS — M1712 Unilateral primary osteoarthritis, left knee: Secondary | ICD-10-CM | POA: Diagnosis not present

## 2019-11-16 ENCOUNTER — Telehealth: Payer: Self-pay | Admitting: Sports Medicine

## 2019-11-16 NOTE — Telephone Encounter (Signed)
  I left a message on the patient's voicemail today after reviewing x-rays of both knees.  Left knee x-ray shows advanced end-stage medial compartmental DJD.  Right knee shows mild tricompartmental degenerative changes.  Definitive treatment for the left knee is total knee arthroplasty but patient's BMI may preclude her from that at this time.  Hopefully the cortisone injection administered at her last visit is helping.  If not, then viscosupplementation could be considered although I am not overly optimistic that that will be helpful.

## 2019-12-07 ENCOUNTER — Other Ambulatory Visit: Payer: Self-pay

## 2019-12-07 ENCOUNTER — Ambulatory Visit
Admission: RE | Admit: 2019-12-07 | Discharge: 2019-12-07 | Disposition: A | Discharge status: Home / Self Care | Payer: Medicare Other | Source: Ambulatory Visit | Attending: Family Medicine | Admitting: Family Medicine

## 2019-12-07 ENCOUNTER — Other Ambulatory Visit: Payer: Self-pay | Admitting: *Deleted

## 2019-12-07 ENCOUNTER — Emergency Department (HOSPITAL_COMMUNITY)
Admission: EM | Admit: 2019-12-07 | Discharge: 2019-12-07 | Disposition: A | Discharge status: Home / Self Care | Payer: Medicare Other | Attending: Emergency Medicine | Admitting: Emergency Medicine

## 2019-12-07 ENCOUNTER — Other Ambulatory Visit: Payer: Self-pay | Admitting: Family Medicine

## 2019-12-07 ENCOUNTER — Encounter (HOSPITAL_COMMUNITY): Payer: Self-pay | Admitting: Emergency Medicine

## 2019-12-07 ENCOUNTER — Encounter: Payer: Self-pay | Admitting: Family Medicine

## 2019-12-07 ENCOUNTER — Ambulatory Visit (INDEPENDENT_AMBULATORY_CARE_PROVIDER_SITE_OTHER): Payer: Medicare Other | Admitting: Family Medicine

## 2019-12-07 VITALS — BP 126/70 | Ht 64.0 in | Wt 270.0 lb

## 2019-12-07 DIAGNOSIS — Z7982 Long term (current) use of aspirin: Secondary | ICD-10-CM | POA: Insufficient documentation

## 2019-12-07 DIAGNOSIS — I10 Essential (primary) hypertension: Secondary | ICD-10-CM | POA: Diagnosis not present

## 2019-12-07 DIAGNOSIS — I2510 Atherosclerotic heart disease of native coronary artery without angina pectoris: Secondary | ICD-10-CM | POA: Diagnosis not present

## 2019-12-07 DIAGNOSIS — M7989 Other specified soft tissue disorders: Secondary | ICD-10-CM | POA: Diagnosis not present

## 2019-12-07 DIAGNOSIS — F17210 Nicotine dependence, cigarettes, uncomplicated: Secondary | ICD-10-CM | POA: Diagnosis not present

## 2019-12-07 DIAGNOSIS — M79644 Pain in right finger(s): Secondary | ICD-10-CM | POA: Insufficient documentation

## 2019-12-07 DIAGNOSIS — L02511 Cutaneous abscess of right hand: Secondary | ICD-10-CM

## 2019-12-07 DIAGNOSIS — Z8709 Personal history of other diseases of the respiratory system: Secondary | ICD-10-CM | POA: Insufficient documentation

## 2019-12-07 DIAGNOSIS — Z79899 Other long term (current) drug therapy: Secondary | ICD-10-CM | POA: Insufficient documentation

## 2019-12-07 DIAGNOSIS — L539 Erythematous condition, unspecified: Secondary | ICD-10-CM | POA: Diagnosis not present

## 2019-12-07 MED ORDER — CEPHALEXIN 500 MG PO CAPS: 500.0000 mg | ORAL_CAPSULE | Freq: Four times a day (QID) | ORAL | 0 refills | Status: DC

## 2019-12-07 MED ORDER — IBUPROFEN 600 MG PO TABS: 600.0000 mg | ORAL_TABLET | Freq: Four times a day (QID) | ORAL | 0 refills | Status: DC | PRN

## 2019-12-07 MED ORDER — HYDROCODONE-ACETAMINOPHEN 5-325 MG PO TABS: 1.0000 | ORAL_TABLET | Freq: Four times a day (QID) | ORAL | 0 refills | Status: DC | PRN

## 2019-12-07 MED ORDER — HYDROCODONE-ACETAMINOPHEN 5-325 MG PO TABS
1.0000 | ORAL_TABLET | Freq: Once | ORAL | Status: AC
  Administered 2019-12-07: 1 via ORAL
  Filled 2019-12-07: qty 1

## 2019-12-07 NOTE — Discharge Instructions (Addendum)
It is extremely important that your fingers be managed by the hand surgeon.  Call the office in the morning to obtain an appointment. Please take all of your antibiotics until finished!   You may develop abdominal discomfort or diarrhea from the antibiotic.  You may help offset this with probiotics which you can buy or get in yogurt. Do not eat or take the probiotics until 2 hours after your antibiotic.   Antiinflammatory medications: Take 600 mg of ibuprofen every 6 hours or 440 mg (over the counter dose) to 500 mg (prescription dose) of naproxen every 12 hours for the next 3 days. After this time, these medications may be used as needed for pain. Take these medications with food to avoid upset stomach. Choose only one of these medications, do not take them together. Acetaminophen (generic for Tylenol): Should you continue to have additional pain while taking the ibuprofen or naproxen, you may add in acetaminophen as needed. Your daily total maximum amount of acetaminophen from all sources should be limited to 4000mg /day for persons without liver problems, or 2000mg /day for those with liver problems. Vicodin: May take Vicodin (hydrocodone-acetaminophen) as needed for severe pain.   Do not drive or perform other dangerous activities while taking this medication as it can cause drowsiness as well as changes in reaction time and judgement.   Please note that each pill of Vicodin contains 325 mg of acetaminophen (generic for Tylenol) and the above dosage limits apply.  Return to the emergency department if you are unable to follow-up with the hand surgeon within the next 24 to 48 hours.

## 2019-12-07 NOTE — Progress Notes (Signed)
PCP: Sherene Sires, DO  Subjective:   HPI: Patient is a 54 y.o. female here for evaluation of right index finger pain.  She has a 2 week hx of right index finger pain over the middle phalanx and DIP. She noticed some swelling and burning pain 2 weeks ago that has worsened and now has redness and a yellowish spot on the dorsum of the DIP. Denies fevers and chillls. She has a hx chronic right finger pain which she localizes to the DIP. She had a tendon laceration in 2003 requiring surgical repair and since that time has had intermittent pain.   Past Medical History:  Diagnosis Date  . Anemia   . Asthma   . Esophagitis   . GERD (gastroesophageal reflux disease)   . HTN (hypertension)   . Hyperplastic colon polyp   . 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    Cor CTA 05/2019: Ca score 81.5 (96th percentile); mRCA < 25; dLM < 25; oLAD 25-49, pLAD < 25   BP 126/70   Ht 5\' 4"  (1.626 m)   Wt 270 lb (122.5 kg)   LMP 02/21/2015   BMI 46.35 kg/m   Review of Systems: See HPI above.     Objective:  Physical Exam:  Gen: NAD, comfortable in exam room  Right index finger: Erythema and swelling present, fluctuant yellow subcutaneous mass present consistent with abscess overlying dorsal middle phalanx TTP over yellow area and DIP Limited finger extension and flexion due to tightness/pain Strength not appreciated due to pain   Assessment & Plan:  1. Right index finger abscess and cellulitis: Patient has 2 week hx of right dorsal index finger pain with erythema and yellow, fluctuant subcutaneous mass. She likely has an abscess and cellulitis and needs to be evaluated by a hand surgeon -- Referred patient to ED for evaluation of hand surgeon per on call physician

## 2019-12-07 NOTE — ED Provider Notes (Signed)
[facility] EMERGENCY DEPARTMENT Provider Note   CSN: [id number] Arrival date & time: 12/07/19  1651     History Chief Complaint  Patient presents with  . Finger Injury    Kendra [name] is a 54 y.o. female.  HPI      Kendra [name] is a 54 y.o. female, with a history of anemia, asthma, GERD, HTN, presenting to the ED with right index finger swelling and pain for the past 2 weeks. She states she was evaluated by sports medicine today, x-ray was obtained, and she was advised to follow-up in the ED.  Denies fever, numbness, weakness, trauma/injury, or any other complaints.    Past Medical History:  Diagnosis Date  . Anemia   . Asthma   . Esophagitis   . GERD (gastroesophageal reflux disease)   . HTN (hypertension)   . Hyperplastic colon polyp   . 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   . Subcutaneous mass of both lower extremities 01/30/2019  . Headache 09/25/2018  . Hyperlipidemia 06/18/2018  . High risk medication use 02/18/2017  . Neck pain on left side 02/18/2017  . HTN (hypertension) 04/03/2016  . Swelling of lower extremity 02/15/2016  . Hemangioma of liver 07/07/2015  . Uterine fibroid 01/14/2015  . Dysfunctional uterine bleeding 01/14/2015  . Tendinopathy of right rotator cuff 08/24/2014  . DOE (dyspnea on exertion) 06/01/2013  . Cervical neck pain with evidence of disc disease 05/14/2012  . Osteoarthrosis, unspecified whether generalized or localized, involving lower leg 05/14/2012  . Chronic low back pain 12/20/2010  . Morbid obesity ([place]) 04/07/2008  . ALCOHOL ABUSE 04/07/2008  . TOBACCO ABUSE 04/07/2008  . Iron deficiency anemia 10/24/2006  . GASTROESOPHAGEAL REFLUX, NO ESOPHAGITIS 10/24/2006    Past Surgical History:   Procedure Laterality Date  . ABDOMINAL HYSTERECTOMY  04/12/15  . ANKLE SURGERY Left   . CESAREAN SECTION    . CYSTOSCOPY N/A 04/12/2015   Procedure: CYSTOSCOPY;  Surgeon: Lavonia Drafts, MD;  Location: [facility];  Service: Gynecology;  Laterality: N/A;  . FINGER SURGERY    . TIBIA FRACTURE SURGERY    . TUBAL LIGATION       OB History    Gravida  3   Para  3   Term  3   Preterm  0   AB  0   Living  3     SAB  0   TAB  0   Ectopic  0   Multiple  0   Live Births              Family History  Problem Relation Age of Onset  . Diabetes Mother   . Heart disease Mother   . Lung cancer Father 39  . Diabetes Sister   . Breast cancer Neg Hx   . Stomach cancer Neg Hx   . Colon cancer Neg Hx   . Pancreatic cancer Neg Hx     Social History   Tobacco Use  . Smoking status: Current Every Day Smoker    Packs/day: 1.50    Types: Cigarettes    Start date: 08/27/2002  . Smokeless tobacco: Never Used  . Tobacco comment: cut back to 4-5 cigs per day from 1ppd  Substance  Use Topics  . Alcohol use: Yes    Alcohol/week: 0.0 standard drinks    Comment: 2 -40oz daily,  24oz daily.    . Drug use: No    Home Medications Prior to Admission medications   Medication Sig Start Date End Date Taking? Authorizing Provider  furosemide (LASIX) 20 MG tablet TAKE 1 TABLET(20 MG) BY MOUTH DAILY 11/02/19   Sherene Sires, DO  acetaminophen (TYLENOL 8 HOUR) 650 MG CR tablet Take 1 tablet (650 mg total) by mouth every 8 (eight) hours as needed for pain. 04/20/19   Sherene Sires, DO  albuterol (PROAIR HFA) 108 [phone] Base) MCG/ACT inhaler inhale 2 puffs every 4 hours if needed wheezing or shortness of breath 04/20/19   Sherene Sires, DO  alum & mag hydroxide-simeth (MAALOX/MYLANTA) 200-200-20 MG/5ML suspension Take 30 mLs by mouth every 6 (six) hours as needed for indigestion or heartburn. Reported on 10/26/2015 04/20/19   Sherene Sires, DO  aspirin EC 81 MG tablet Take 1 tablet (81 mg total) by  mouth daily. 06/08/19   Richardson Dopp T, PA-C  buPROPion (WELLBUTRIN) 75 MG tablet Take 1 tablet (75 mg total) by mouth 2 (two) times daily. 09/24/19   Sherene Sires, DO  cephALEXin (KEFLEX) 500 MG capsule Take 1 capsule (500 mg total) by mouth 4 (four) times daily. 12/07/19   Joy, Shawn C, PA-C  gabapentin (NEURONTIN) 300 MG capsule TAKE 1 CAPSULE(300 MG) BY MOUTH AT BEDTIME 04/20/19   Sherene Sires, DO  HYDROcodone-acetaminophen (NORCO/VICODIN) 5-325 MG tablet Take 1-2 tablets by mouth every 6 (six) hours as needed for severe pain. 12/07/19   Joy, Shawn C, PA-C  ibuprofen (ADVIL) 600 MG tablet Take 1 tablet (600 mg total) by mouth every 6 (six) hours as needed. 12/07/19   Joy, Shawn C, PA-C  lisinopril (ZESTRIL) 10 MG tablet TAKE 1 TABLET(10 MG) BY MOUTH DAILY 08/04/19   Sherene Sires, DO  metoprolol tartrate (LOPRESSOR) 100 MG tablet One time only two hours prior to test. 05/01/19   Richardson Dopp T, PA-C  pantoprazole (PROTONIX) 40 MG tablet TAKE 1 TABLET BY MOUTH TWICE DAILY BEFORE A MEAL 04/20/19   Sherene Sires, DO  rosuvastatin (CRESTOR) 40 MG tablet Take 1 tablet (40 mg total) by mouth daily. 09/10/19 12/09/19  Richardson Dopp T, PA-C  traMADol (ULTRAM) 50 MG tablet Take 1 tablet by mouth every 12 hours if needed. 10/27/19   Thurman Coyer, DO    Allergies    Lisinopril  Review of Systems   Review of Systems  Musculoskeletal: Positive for arthralgias.  Neurological: Negative for weakness and numbness.    Physical Exam Updated Vital Signs BP (!) 155/83 (BP Location: Right Wrist)   Pulse 86   Temp 98.4 F (36.9 C) (Oral)   Resp 18   Ht 5\' 4"  (1.626 m)   Wt 122.5 kg   LMP 02/21/2015   SpO2 100%   BMI 46.35 kg/m   Physical Exam Vitals and nursing note reviewed.  Constitutional:      General: She is not in acute distress.    Appearance: She is well-developed. She is not diaphoretic.  HENT:     Head: Normocephalic and atraumatic.  Eyes:     Conjunctiva/sclera: Conjunctivae normal.   Cardiovascular:     Rate and Rhythm: Normal rate and regular rhythm.     Pulses:          Radial pulses are 2+ on the right side.  Pulmonary:     Effort: Pulmonary effort  is normal.  Musculoskeletal:     Cervical back: Neck supple.     Comments: Tenderness, erythema, and some swelling to the right index finger, mostly between the DIP and PIP joints. All noted areas of swelling, erythema, and tenderness are located on the dorsal surface of the finger.  There is no tenderness, swelling, or other abnormality noted to the palmar surface of the finger. There is no swelling or tenderness in the area immediately surrounding the nail.  Skin:    General: Skin is warm and dry.     Capillary Refill: Capillary refill takes less than 2 seconds.     Coloration: Skin is not pale.  Neurological:     Mental Status: She is alert.     Comments: Sensation to light touch intact in the right index finger. Flexion and extension intact in the right index finger.  Psychiatric:        Behavior: Behavior normal.            ED Results / Procedures / Treatments   Labs (all labs ordered are listed, but only abnormal results are displayed) Labs Reviewed - No data to display  EKG None  Radiology DG Finger Index Right  Result Date: 12/07/2019 CLINICAL DATA:  Pain and swelling, no known injury, initial encounter EXAM: RIGHT INDEX FINGER 2+V COMPARISON:  10/14/2017 FINDINGS: No acute fracture or dislocation is noted. Soft tissue swelling is noted throughout the finger. A small bony density is noted adjacent to the base of the distal phalanx consistent with prior trauma and nonunion. No new focal abnormality is seen. IMPRESSION: Soft tissue swelling new from the prior exam. Chronic avulsion fracture at the base of the distal phalanx. Electronically Signed   By: Inez Catalina M.D.   On: 12/07/2019 13:40    Procedures Procedures (including critical care time)  Medications Ordered in ED Medications   HYDROcodone-acetaminophen (NORCO/VICODIN) 5-325 MG per tablet 1 tablet (1 tablet Oral Given 12/07/19 2014)    ED Course  I have reviewed the triage vital signs and the nursing notes.  Pertinent labs & imaging results that were available during my care of the patient were reviewed by me and considered in my medical decision making (see chart for details).  Clinical Course as of Dec 07 2302  Mon Dec 07, 2019  1925 Spoke with Dr. Lenon Curt, hand surgeon. We discussed patient's presentation and the appearance of her finger.  I also discussed concerns about the location of the swelling directly over the extensor tendon.  Dr. Lenon Curt suggests we evaluate whether we think an I&D can be performed safely in the ED.  If not, call back.   [SJ]  2110 Spoke with Dr. Lenon Curt again.  I advised him that we are hesitant to perform I&D on this due to its proximity to the extensor tendon.  He advises placing the patient on antibiotics and he will see her in the office.   [SJ]    Clinical Course User Index [SJ] Joy, Helane Gunther, PA-C   MDM Rules/Calculators/A&P                      Patient presents with right index finger swelling and pain. There may be an abscess to the dorsal side of the finger with surrounding cellulitis.  We had concerns about performing I&D in the ED due to the proximity to the extensor tendon. She will have close follow-up with the hand surgeon. The patient was given instructions for home care  as well as return precautions. Patient voices understanding of these instructions, accepts the plan, and is comfortable with discharge.  Findings and plan of care discussed with Kendra Rob, MD. Dr. Roslynn Amble personally evaluated and examined this patient.     Final Clinical Impression(s) / ED Diagnoses Final diagnoses:  Finger pain, right    Rx / DC Orders ED Discharge Orders         Ordered    cephALEXin (KEFLEX) 500 MG capsule  4 times daily     12/07/19 2142    HYDROcodone-acetaminophen  (NORCO/VICODIN) 5-325 MG tablet  Every 6 hours PRN     12/07/19 2142    ibuprofen (ADVIL) 600 MG tablet  Every 6 hours PRN     12/07/19 2142           Lorayne Bender, PA-C 12/07/19 2306    Kendra Starch, MD 12/09/19 1255

## 2019-12-07 NOTE — ED Triage Notes (Signed)
Pt. Stated, My finger has been infected for 2 weeks. Rt. Index finger 1st knuckle and above.

## 2019-12-08 ENCOUNTER — Telehealth: Payer: Self-pay | Admitting: Physician Assistant

## 2019-12-08 NOTE — Telephone Encounter (Signed)
*  STAT* If patient is at the pharmacy, call can be transferred to refill team.   1. Which medications need to be refilled? (please list name of each medication and dose if known)  lisinopril (ZESTRIL) 10 MG tablet  2. Which pharmacy/location (including street and city if local pharmacy) is medication to be sent to?  [facility] [phone] - [place], [address] AT [address]  3. Do they need a 30 day or 90 day supply? 90  Patient is out of medication

## 2019-12-08 NOTE — Telephone Encounter (Signed)
[place] to informed them that the pt's PCP refills medication Lisinopril and that they needed to contact Dr. Sherene Sires to request a refill. Pharmacist verbalized understanding.

## 2019-12-09 DIAGNOSIS — L03011 Cellulitis of right finger: Secondary | ICD-10-CM | POA: Diagnosis not present

## 2019-12-14 ENCOUNTER — Other Ambulatory Visit: Payer: Medicare Other | Admitting: *Deleted

## 2019-12-14 ENCOUNTER — Other Ambulatory Visit: Payer: Self-pay

## 2019-12-14 DIAGNOSIS — E785 Hyperlipidemia, unspecified: Secondary | ICD-10-CM

## 2019-12-14 DIAGNOSIS — I2510 Atherosclerotic heart disease of native coronary artery without angina pectoris: Secondary | ICD-10-CM | POA: Diagnosis not present

## 2019-12-14 LAB — LIPID PANEL
Chol/HDL Ratio: 3.8 ratio (ref 0.0–4.4)
Cholesterol, Total: 222 mg/dL — ABNORMAL HIGH (ref 100–199)
HDL: 59 mg/dL (ref >39)
LDL Chol Calc (NIH): 132 mg/dL — ABNORMAL HIGH (ref 0–99)
Triglycerides: 173 mg/dL — ABNORMAL HIGH (ref 0–149)
VLDL Cholesterol Cal: 31 mg/dL (ref 5–40)

## 2019-12-14 LAB — HEPATIC FUNCTION PANEL
ALT: 25 IU/L (ref 0–32)
AST: 21 IU/L (ref 0–40)
Albumin: 4.2 g/dL (ref 3.8–4.9)
Alkaline Phosphatase: 85 IU/L (ref 39–117)
Bilirubin Total: 0.4 mg/dL (ref 0.0–1.2)
Bilirubin, Direct: 0.13 mg/dL (ref 0.00–0.40)
Total Protein: 6.7 g/dL (ref 6.0–8.5)

## 2019-12-15 ENCOUNTER — Telehealth: Payer: Self-pay

## 2019-12-15 NOTE — Telephone Encounter (Signed)
Patient states she was fasting at lab appointment.

## 2019-12-15 NOTE — Telephone Encounter (Signed)
-----   Message from Liliane Shi, [place] sent at 12/15/2019 12:16 PM EDT ----- LFTs ok.  Triglycerides marginally improved. LDL is now higher.  Her numbers should have improved with med changes made in Jan 2021.   - Ask patient if she is taking Rosuvastatin 40 mg once daily and Fish oil 1000 mg twice daily  Richardson Dopp, PA-C    12/15/2019 12:13 PM

## 2019-12-15 NOTE — Telephone Encounter (Signed)
Patient returning call. She states she did not eat anything before her labs.

## 2019-12-15 NOTE — Telephone Encounter (Signed)
I called and spoke with patient, she states that she is taking Fish Oil twice a day and Rosuvastatin once a day as well.

## 2019-12-15 NOTE — Telephone Encounter (Signed)
Was she definitely fasting? Richardson Dopp, PA-C    12/15/2019 1:54 PM

## 2019-12-15 NOTE — Telephone Encounter (Signed)
I called and left patient a message. Was she definitely fasting yesterday for lab work?

## 2019-12-16 NOTE — Telephone Encounter (Signed)
Continue Rosuvastatin and Fish Oil. Start Ezetimibe (Zetia) 10 mg once daily. Obtain fasting Lipids and LFTs in 3 mos. Richardson Dopp, PA-C    12/16/2019 4:51 PM

## 2019-12-18 NOTE — Telephone Encounter (Signed)
I called and left patient a message to call back. 

## 2019-12-21 NOTE — Progress Notes (Signed)
Cardiology Office Note:    Date:  12/22/2019   ID:  MARSHAWNA [name], DOB 05/19/1966, MRN [id number]  PCP:  Sherene Sires, DO  Cardiologist:  Sherren Mocha, MD / Richardson Dopp, PA-C  Electrophysiologist:  None   Referring MD: Sherene Sires, DO   Chief Complaint:  Follow-up (CAD, Hyperlipidemia )    Patient Profile:    Kendra [name] is a 54 y.o. female with:   Chest pain   Coronary artery disease [non-obstructive] ? Myoview 6/19: Low Risk, no ischemia ? Coronary CTA 05/2019: Ca score 81.5 (96th percentile); minimal plaque (<25%) in mRCA, dLM and pLAD; mild plaque (25-49%) in oLAD  Tobacco use  Hyperlipidemia   GERD  Prior CV studies: Coronary CTA 06/05/2019 IMPRESSION: 1. Coronary calcium score of 81.5. This was 51 percentile for age and sex matched control.  2. Normal coronary origin with right dominance.  3. Mild mixed attenuation plaque in the ostium of LAD. Otherwise minimal (<25%) disease in the distal left main and RCA.  4. Recommend aggressive risk factor modification and high-potency Statin.  Echocardiogram 04/16/2019 EF [id number], normal diastolic function, normal RVSF  Myoview 01/29/18 EF 60, attenuation artifact, no ischemia; low risk  Echocardiogram 03/02/2016 Mild LVH, EF 55-60, no RWMA, normal diastolic fn  History of Present Illness:    Kendra [name] was last seen in clinic in October 2020.  She returns for follow-up.  Of note, she was recently placed on ezetimibe due to uncontrolled lipids on rosuvastatin and fish oil.  She returns for follow-up.  She is here alone.  She continues to have occasional episodes of chest discomfort.  This is chronic and unchanged.  She notes shortness of breath with activities.  She does note a lot of wheezing.  She continues to smoke.  She has not had syncope or significant lower extremity swelling.  Past Medical History:  Diagnosis Date  . Anemia   . Asthma   . Esophagitis   . GERD (gastroesophageal reflux disease)     . HTN (hypertension)   . Hyperplastic colon polyp   . 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 acetaminophen (TYLENOL 8 HOUR) 650 MG CR tablet Take 1 tablet (650 mg total) by mouth every 8 (eight) hours as needed for pain.  Marland Kitchen albuterol (PROAIR HFA) 108 (90 Base) MCG/ACT inhaler inhale 2 puffs every 4 hours if needed wheezing or shortness of breath  . alum & mag hydroxide-simeth (MAALOX/MYLANTA) 200-200-20 MG/5ML suspension Take 30 mLs by mouth every 6 (six) hours as needed for indigestion or heartburn. Reported on 10/26/2015  . aspirin EC 81 MG tablet Take 1 tablet (81 mg total) by mouth daily.  Marland Kitchen buPROPion (WELLBUTRIN) 75 MG tablet Take 1 tablet (75 mg total) by mouth 2 (two) times daily.  . cephALEXin (KEFLEX) 500 MG capsule Take 1 capsule (500 mg total) by mouth 4 (four) times daily.  . furosemide (LASIX) 20 MG tablet TAKE 1 TABLET(20 MG) BY MOUTH DAILY  . gabapentin (NEURONTIN) 300 MG capsule TAKE 1 CAPSULE(300 MG) BY MOUTH AT BEDTIME  . HYDROcodone-acetaminophen (NORCO/VICODIN) 5-325 MG tablet Take 1-2 tablets by mouth every 6 (six) hours as needed for severe pain.  Marland Kitchen ibuprofen (ADVIL) 600 MG tablet Take 1 tablet (600 mg total) by mouth every 6 (six) hours as needed.  . metoprolol  tartrate (LOPRESSOR) 100 MG tablet One time only two hours prior to test.  . pantoprazole (PROTONIX) 40 MG tablet TAKE 1 TABLET BY MOUTH TWICE DAILY BEFORE A MEAL  . rosuvastatin (CRESTOR) 40 MG tablet Take 1 tablet (40 mg total) by mouth daily.  . traMADol (ULTRAM) 50 MG tablet Take 1 tablet by mouth every 12 hours if needed.     Allergies:   Lisinopril   Social History   Tobacco Use  . Smoking status: Current Every Day Smoker    Packs/day: 1.50    Types: Cigarettes    Start date: 08/27/2002  . Smokeless tobacco: Never Used  . Tobacco comment: cut  back to 4-5 cigs per day from 1ppd  Substance Use Topics  . Alcohol use: Yes    Alcohol/week: 0.0 standard drinks    Comment: 2 -40oz daily,  24oz daily.    . Drug use: No     Family Hx: The patient's family history includes Diabetes in her mother and sister; Heart disease in her mother; Lung cancer (age of onset: 5) in her father. There is no history of Breast cancer, Stomach cancer, Colon cancer, or Pancreatic cancer.  ROS   EKGs/Labs/Other Test Reviewed:    EKG:  EKG is   ordered today.  The ekg ordered today demonstrates normal sinus rhythm, heart rate 86, normal axis, no ST-T wave changes, QTC 452, no change since prior tracing  Recent Labs: 04/09/2019: Hemoglobin 15.9; Platelets 253 05/01/2019: BUN 17; Creatinine, Ser 0.88; NT-Pro BNP 43; Potassium 4.6; Sodium 140 12/14/2019: ALT 25   Recent Lipid Panel Lab Results  Component Value Date/Time   CHOL 222 (H) 12/14/2019 11:02 AM   TRIG 173 (H) 12/14/2019 11:02 AM   HDL 59 12/14/2019 11:02 AM   CHOLHDL 3.8 12/14/2019 11:02 AM   CHOLHDL 3.0 02/22/2016 03:38 PM   LDLCALC 132 (H) 12/14/2019 11:02 AM    Physical Exam:    VS:  BP 122/70   Pulse 86   Ht 5\' 4"  (1.626 m)   Wt 276 lb 12.8 oz (125.6 kg)   LMP 02/21/2015   SpO2 96%   BMI 47.51 kg/m     Wt Readings from Last 3 Encounters:  12/22/19 276 lb 12.8 oz (125.6 kg)  12/07/19 270 lb (122.5 kg)  12/07/19 270 lb (122.5 kg)     Constitutional:      Appearance: Healthy appearance. Not in distress.  Neck:     Thyroid: No thyromegaly.     Vascular: JVD normal.  Pulmonary:     Effort: Pulmonary effort is normal.     Breath sounds: No wheezing. No rales.  Cardiovascular:     Normal rate. Regular rhythm. Normal S1. Normal S2.     Murmurs: There is no murmur.  Edema:    Peripheral edema absent.  Abdominal:     Palpations: Abdomen is soft.  Skin:    General: Skin is warm and dry.  Neurological:     Mental Status: Alert and oriented to person, place and time.      Cranial Nerves: Cranial nerves are intact.       ASSESSMENT & PLAN:    1. Coronary artery disease involving native coronary artery of native heart without angina pectoris Mild plaque without obstruction on Coronary CTA in 05/2019.  She continues to have occasional chest pain that is unchanged.  This does not seem to be anginal in nature.  Continue ASA, Statin Rx.  FU in 1 year.   2.  Mixed hyperlipidemia Recent LDL 132.  Continue Rosuvastatin 40 mg.  Add ezetimibe 10 mg daily.  Arrange fasting lipids and LFTs in 3 months.  3. Tobacco abuse She continues to smoke.  She knows that she needs to quit.  We briefly discussed a plan for going forward.  She could be referred to pulmonology for further evaluation and management as I suspect some of her chest pain is related to underlying lung disease related to smoking.  However, I explained to her that there is not much that can be done as long as she continues to smoke.  She should follow-up with primary care to determine if referral to pulmonology is right for her.  She will continue to work on quitting smoking.  4. Morbid obesity ([place]) She has a referral pending to a nutritionist.  I have recommended that she increase activity and work on weight loss.   Dispo:  Return for Routine Follow Up, w/ Richardson Dopp, PA-C, in person.   Medication Adjustments/Labs and Tests Ordered: Current medicines are reviewed at length with the patient today.  Concerns regarding medicines are outlined above.  Tests Ordered: Orders Placed This Encounter  Procedures  . Hepatic function panel  . Lipid panel  . EKG 12-Lead   Medication Changes: Meds ordered this encounter  Medications  . ezetimibe (ZETIA) 10 MG tablet    Sig: Take 1 tablet (10 mg total) by mouth daily.    Dispense:  90 tablet    Refill:  3    Signed, Richardson Dopp, PA-C  12/22/2019 10:54 AM    [facility] [place], [place], [place]  [address] Phone: [phone];  Fax: [phone]

## 2019-12-22 ENCOUNTER — Ambulatory Visit (INDEPENDENT_AMBULATORY_CARE_PROVIDER_SITE_OTHER): Payer: Medicare Other | Admitting: Physician Assistant

## 2019-12-22 ENCOUNTER — Other Ambulatory Visit: Payer: Self-pay

## 2019-12-22 ENCOUNTER — Encounter: Payer: Self-pay | Admitting: Physician Assistant

## 2019-12-22 VITALS — BP 122/70 | HR 86 | Ht 64.0 in | Wt 276.8 lb

## 2019-12-22 DIAGNOSIS — E782 Mixed hyperlipidemia: Secondary | ICD-10-CM | POA: Diagnosis not present

## 2019-12-22 DIAGNOSIS — Z720 Tobacco use: Secondary | ICD-10-CM

## 2019-12-22 DIAGNOSIS — I2510 Atherosclerotic heart disease of native coronary artery without angina pectoris: Secondary | ICD-10-CM

## 2019-12-22 MED ORDER — EZETIMIBE 10 MG PO TABS: 10.0000 mg | ORAL_TABLET | Freq: Every day | ORAL | 3 refills | Status: DC

## 2019-12-22 NOTE — Patient Instructions (Addendum)
Medication Instructions:  Your physician has recommended you make the following change in your medication:   1. START ZETIA 10 MG DAILY  *If you need a refill on your cardiac medications before your next appointment, please call your pharmacy*   Lab Work: TO BE DONE IN 3 MONTHS: LFTS, LIPIDS. APPOINTMENT 03/22/20    If you have labs (blood work) drawn today and your tests are completely normal, you will receive your results only by: Marland Kitchen MyChart Message (if you have MyChart) OR . A paper copy in the mail If you have any lab test that is abnormal or we need to change your treatment, we will call you to review the results.   Testing/Procedures: NONE   Follow-Up: At [facility], you and your health needs are our priority.  As part of our continuing mission to provide you with exceptional heart care, we have created designated Provider Care Teams.  These Care Teams include your primary Cardiologist (physician) and Advanced Practice Providers (APPs -  Physician Assistants and Nurse Practitioners) who all work together to provide you with the care you need, when you need it.  We recommend signing up for the patient portal called "MyChart".  Sign up information is provided on this After Visit Summary.  MyChart is used to connect with patients for Virtual Visits (Telemedicine).  Patients are able to view lab/test results, encounter notes, upcoming appointments, etc.  Non-urgent messages can be sent to your provider as well.   To learn more about what you can do with MyChart, go to [email or web address].    Your next appointment:   1 year(s)  The format for your next appointment:   In Person  Provider:   Richardson Dopp, PA-C

## 2019-12-23 NOTE — Telephone Encounter (Signed)
I called and left patient a message to call back. 

## 2019-12-25 NOTE — Telephone Encounter (Signed)
I called and left patient a detailed message with Scott's recommendations about continuing Fish Oil and Rosuvastatin and to add Zetia. Advised patient to call me back to discuss.

## 2019-12-30 MED ORDER — EZETIMIBE 10 MG PO TABS: 10.0000 mg | ORAL_TABLET | Freq: Every day | ORAL | 3 refills | Status: DC

## 2019-12-30 NOTE — Addendum Note (Signed)
Addended by: Mady Haagensen on: 12/30/2019 09:07 AM   Modules accepted: Orders

## 2019-12-30 NOTE — Telephone Encounter (Signed)
I called and spoke with patient, she is aware to start Zetia and continue Rosuvastatin and Fish Oil. Patient will come back on 03/31/20 for repeat labs.

## 2020-01-04 ENCOUNTER — Other Ambulatory Visit: Payer: Self-pay

## 2020-01-04 ENCOUNTER — Encounter: Payer: Self-pay | Admitting: Family Medicine

## 2020-01-04 ENCOUNTER — Ambulatory Visit (INDEPENDENT_AMBULATORY_CARE_PROVIDER_SITE_OTHER): Payer: Medicare Other | Admitting: Family Medicine

## 2020-01-04 DIAGNOSIS — K219 Gastro-esophageal reflux disease without esophagitis: Secondary | ICD-10-CM | POA: Diagnosis not present

## 2020-01-04 DIAGNOSIS — G8929 Other chronic pain: Secondary | ICD-10-CM

## 2020-01-04 DIAGNOSIS — R0600 Dyspnea, unspecified: Secondary | ICD-10-CM | POA: Diagnosis not present

## 2020-01-04 DIAGNOSIS — M545 Low back pain: Secondary | ICD-10-CM

## 2020-01-04 DIAGNOSIS — F17200 Nicotine dependence, unspecified, uncomplicated: Secondary | ICD-10-CM

## 2020-01-04 DIAGNOSIS — R0609 Other forms of dyspnea: Secondary | ICD-10-CM

## 2020-01-04 MED ORDER — PANTOPRAZOLE SODIUM 40 MG PO TBEC: DELAYED_RELEASE_TABLET | ORAL | 0 refills | Status: DC

## 2020-01-04 MED ORDER — ALUM & MAG HYDROXIDE-SIMETH 200-200-20 MG/5ML PO SUSP: 30.0000 mL | Freq: Four times a day (QID) | ORAL | 1 refills | Status: AC | PRN

## 2020-01-04 MED ORDER — ACETAMINOPHEN ER 650 MG PO TBCR: 650.0000 mg | EXTENDED_RELEASE_TABLET | Freq: Three times a day (TID) | ORAL | 1 refills | Status: AC | PRN

## 2020-01-04 MED ORDER — BUPROPION HCL 75 MG PO TABS: 75.0000 mg | ORAL_TABLET | Freq: Two times a day (BID) | ORAL | 0 refills | Status: DC

## 2020-01-04 MED ORDER — GABAPENTIN 300 MG PO CAPS: ORAL_CAPSULE | ORAL | 5 refills | Status: DC

## 2020-01-04 MED ORDER — ALBUTEROL SULFATE HFA 108 (90 BASE) MCG/ACT IN AERS: INHALATION_SPRAY | RESPIRATORY_TRACT | 3 refills | Status: DC

## 2020-01-04 NOTE — Progress Notes (Signed)
    SUBJECTIVE:   CHIEF COMPLAINT / HPI: regular checkup  Patient comes in for regular checkup.  She also wants to know if I think she should get an A1c drawn because she said that her home health care nurse asked her if she thought it should happen.  I discussed with her that I was willing to do this but after chart review does not appear that it is warranted so she decided she does not want that.  She said that she would like to get refills of all her medications because she is about to run out.  We had a further conversation about her desire to lose weight, she said that she was not given a specific goal but that her knee surgeon told her she could not get a new knee until she lost some weight.  I asked her what her current plan was for this and what she has been doing and she said "not much at all ".  We discussed the option of talking to nutrition and potentially bariatric surgery, both of which she was interested in so those referrals are being placed.  In the meantime I told her to start avoiding sugary drinks and sugary foods  PERTINENT  PMH / PSH:   OBJECTIVE:   BP (!) 145/80   Pulse 95   Ht 5\' 4"  (1.626 m)   Wt 280 lb 3.2 oz (127.1 kg)   LMP 02/21/2015   SpO2 98%   BMI 48.10 kg/m   General: Alert, pleasant, no distress Cardiac : regular rate and rhythm Pulmonary: No increased work of breathing, no cough, regular rate  ASSESSMENT/PLAN:   DOE (dyspnea on exertion) No recent events or exacerbations, just wants refill on chronic albuterol  Chronic low back pain Long-term chronic back pain, I told her that physical activity and her weight loss goals will likely help this.  Refill chronic meds  TOBACCO ABUSE Still working on quitting smoking, we discussed how important this was and she wants to continue working on decreasing slowly  Morbid obesity Weight is now keeping her from getting knee surgery, she accepts bariatric and nutrition referrals and will start working on  avoiding sugary drinks and foods  Gastroesophageal reflux disease Well-controlled on PPI, will refill     Sherene Sires, [place]

## 2020-01-04 NOTE — Patient Instructions (Signed)
Today we talked about your attempts to lose weight in connection with wanted to get your knee surgery.  I am putting in a referral to the nutritionist and to the bariatric surgeon so that you can start discussions with both of them.  Please call the number on the card that I gave you to schedule your appointment on the phone with a nutritionist.  You should probably hear from the bariatric surgeons office in the next week or so, if you do not please call and ask for our coordinator to get their number so that you can check up with them.  Dr. Criss Rosales

## 2020-01-05 NOTE — Assessment & Plan Note (Signed)
Weight is now keeping her from getting knee surgery, she accepts bariatric and nutrition referrals and will start working on avoiding sugary drinks and foods

## 2020-01-05 NOTE — Assessment & Plan Note (Signed)
Long-term chronic back pain, I told her that physical activity and her weight loss goals will likely help this.  Refill chronic meds

## 2020-01-05 NOTE — Assessment & Plan Note (Signed)
No recent events or exacerbations, just wants refill on chronic albuterol

## 2020-01-05 NOTE — Assessment & Plan Note (Signed)
Still working on quitting smoking, we discussed how important this was and she wants to continue working on decreasing slowly

## 2020-01-05 NOTE — Assessment & Plan Note (Signed)
Well-controlled on PPI, will refill

## 2020-01-20 DIAGNOSIS — L03011 Cellulitis of right finger: Secondary | ICD-10-CM | POA: Diagnosis not present

## 2020-02-01 ENCOUNTER — Ambulatory Visit: Payer: Medicare Other | Admitting: Family Medicine

## 2020-03-18 ENCOUNTER — Other Ambulatory Visit: Payer: Self-pay | Admitting: Family Medicine

## 2020-03-18 DIAGNOSIS — F17200 Nicotine dependence, unspecified, uncomplicated: Secondary | ICD-10-CM

## 2020-03-22 ENCOUNTER — Other Ambulatory Visit: Payer: Medicare Other

## 2020-03-31 ENCOUNTER — Other Ambulatory Visit: Payer: Medicare Other

## 2020-04-04 ENCOUNTER — Other Ambulatory Visit: Payer: Medicare Other | Admitting: *Deleted

## 2020-04-04 ENCOUNTER — Other Ambulatory Visit: Payer: Self-pay | Admitting: *Deleted

## 2020-04-04 ENCOUNTER — Other Ambulatory Visit: Payer: Self-pay

## 2020-04-04 DIAGNOSIS — I2510 Atherosclerotic heart disease of native coronary artery without angina pectoris: Secondary | ICD-10-CM | POA: Diagnosis not present

## 2020-04-04 DIAGNOSIS — E782 Mixed hyperlipidemia: Secondary | ICD-10-CM

## 2020-04-04 DIAGNOSIS — M7989 Other specified soft tissue disorders: Secondary | ICD-10-CM

## 2020-04-04 MED ORDER — FUROSEMIDE 20 MG PO TABS: ORAL_TABLET | ORAL | 1 refills | Status: DC

## 2020-04-05 ENCOUNTER — Other Ambulatory Visit: Payer: Self-pay

## 2020-04-05 DIAGNOSIS — E782 Mixed hyperlipidemia: Secondary | ICD-10-CM

## 2020-04-05 DIAGNOSIS — I2510 Atherosclerotic heart disease of native coronary artery without angina pectoris: Secondary | ICD-10-CM

## 2020-04-05 LAB — LIPID PANEL
Chol/HDL Ratio: 3.3 ratio (ref 0.0–4.4)
Cholesterol, Total: 194 mg/dL (ref 100–199)
HDL: 58 mg/dL (ref >39)
LDL Chol Calc (NIH): 115 mg/dL — ABNORMAL HIGH (ref 0–99)
Triglycerides: 120 mg/dL (ref 0–149)
VLDL Cholesterol Cal: 21 mg/dL (ref 5–40)

## 2020-04-05 LAB — HEPATIC FUNCTION PANEL
ALT: 19 IU/L (ref 0–32)
AST: 19 IU/L (ref 0–40)
Albumin: 4.2 g/dL (ref 3.8–4.9)
Alkaline Phosphatase: 80 IU/L (ref 48–121)
Bilirubin Total: 0.4 mg/dL (ref 0.0–1.2)
Bilirubin, Direct: 0.1 mg/dL (ref 0.00–0.40)
Total Protein: 6.5 g/dL (ref 6.0–8.5)

## 2020-04-20 NOTE — Progress Notes (Deleted)
Patient ID: [name]                 DOB: February 19, 1966                    MRN: [id number]     HPI: Kendra [name] is a 54 y.o. female patient referred to lipid clinic by Kendra Dopp, PA. PMH is significant for CAD, HLD, HTN, tobacco abuse, obesity, and GERD. Pt underwent coronary calcium score on 06/05/19 which revealed calcium score of 81.5 (96th percentile for age and sex). Mild mixed attenuation plaque 25-49% was noted in the ostium of LAD, otherwise < 25% diease noted in the distal left main and RCA.  Adherent to rosuva 40, zetia and fish oil when labs were drawn this month? Add [id number] if so repatha PA sent on 8/25 and approved through 10/21/20, haven't sent rx to pharmacy yet Vascepa is on formulary, change over Otherwise, nexlizet Taking fish oil is this otc? Never added to med list but advised to start Jan labs and TG did improve notably  Clarify tobacco and alcohol use Mom with heart disease what age?  Current Medications: rosuvastatin 40mg  daily, ezetimibe 10mg  daily Risk Factors: elevated calcium score with CAD noted, tobacco abuse, family history of CAD, obesity LDL goal: 70mg /dL  Diet:   Exercise:   Family History: The patient's family history includes Diabetes in her mother and sister; Heart disease in her mother; Lung cancer (age of onset: 48) in her father. There is no history of Breast cancer, Stomach cancer, Colon cancer, or Pancreatic cancer.  Social History: Smokes 1 PPD, denies illicit drug use.  Labs: 04/04/20: TC 194, TG 120, HDL 58, LDL 115 (rosuvastatin 40mg  daily, ezetimibe 10mg  daily) 12/14/19: TC 222, TG 173, HDL 59, LDL 132 (rosuvastatin 40mg  daily, fish oil 1g BID) 09/09/19: TC 187, TG 234, HDL 53, LDL 95 (atorvastatin 80mg  daily)  Past Medical History:  Diagnosis Date  . Anemia   . Asthma   . Esophagitis   . GERD (gastroesophageal reflux disease)   . HTN (hypertension)   . Hyperplastic colon polyp   . 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     Cor CTA 05/2019: Ca score 81.5 (96th percentile); mRCA < 25; dLM < 25; oLAD 25-49, pLAD < 25    Current Outpatient Medications on File Prior to Visit  Medication Sig Dispense Refill  . acetaminophen (TYLENOL 8 HOUR) 650 MG CR tablet Take 1 tablet (650 mg total) by mouth every 8 (eight) hours as needed for pain. 30 tablet 1  . albuterol (PROAIR HFA) 108 (90 Base) MCG/ACT inhaler inhale 2 puffs every 4 hours if needed wheezing or shortness of breath 8.5 g 3  . alum & mag hydroxide-simeth (MAALOX/MYLANTA) 200-200-20 MG/5ML suspension Take 30 mLs by mouth every 6 (six) hours as needed for indigestion or heartburn. Reported on 10/26/2015 355 mL 1  . aspirin EC 81 MG tablet Take 1 tablet (81 mg total) by mouth daily. 30 tablet 1  . buPROPion (WELLBUTRIN) 75 MG tablet TAKE 1 TABLET(75 MG) BY MOUTH TWICE DAILY 60 tablet 1  . ezetimibe (ZETIA) 10 MG tablet Take 1 tablet (10 mg total) by mouth daily. 90 tablet 3  . furosemide (LASIX) 20 MG tablet TAKE 1 TABLET(20 MG) BY MOUTH DAILY 90 tablet 1  . gabapentin (NEURONTIN) 300 MG capsule TAKE 1 CAPSULE(300 MG) BY MOUTH AT BEDTIME 30 capsule 5  . ibuprofen (ADVIL) 600 MG tablet Take 1 tablet (600 mg  total) by mouth every 6 (six) hours as needed. 30 tablet 0  . metoprolol tartrate (LOPRESSOR) 100 MG tablet One time only two hours prior to test. 1 tablet 0  . pantoprazole (PROTONIX) 40 MG tablet TAKE 1 TABLET BY MOUTH TWICE DAILY BEFORE A MEAL 180 tablet 0  . rosuvastatin (CRESTOR) 40 MG tablet Take 1 tablet (40 mg total) by mouth daily. 90 tablet 3  . traMADol (ULTRAM) 50 MG tablet Take 1 tablet by mouth every 12 hours if needed. 60 tablet 0   No current facility-administered medications on file prior to visit.    Allergies  Allergen Reactions  . Lisinopril Swelling    Assessment/Plan:  1. Hyperlipidemia -

## 2020-04-21 ENCOUNTER — Ambulatory Visit: Payer: Medicare Other

## 2020-04-24 NOTE — Progress Notes (Signed)
Patient ID: DENIQUA PERRY                 DOB: 03-18-1966                    MRN: [id number]     HPI: Kendra [name] is a 54 y.o. female patient referred to lipid clinic by Richardson Dopp, PA. PMH is significant for non-obstructive CAD with coronary calcium score 81.5 (96th percentile) in October of 2020, CP, tobacco use, HLD, and GERD.  Patient arrives today for initial visit. She does not complain of any side effects with her rosuvastatin, ezetimibe, and over-the-counter fish oil. She mostly cooks at home and eats chicken and rice stew, fried chicken, baked/fried fish, and baked [place] wings. Her exercise is minimal after her two knee replacements. She continues to smoke 1 PPD but is interested in weaning down her cigarette usage. She has been unable to use the nicotine patches due to high cost.  Current Medications: rosuvastatin 40 mg daily, ezetimibe 10 mg daily, fish oil 1g BID Intolerances: atorvastatin 40-80mg  daily Risk Factors: CAD, tobacco use, obesity LDL goal: < 70 mg/dL  Diet: Mainly cooks at home. Does not eat breakfast or lunch - only dinner. Enjoys chicken and rice stew, fried chicken, baked/fried fish, baked [place] wings   Exercise: minimal given two knee replacements  Family History: none that pt is aware of  Social History: currently smoking 1 PPD - interested in smoking cessation, has not tried NRT in the past  Labs: 09/09/19: TC 187, TG 234, HDL 53, LDL 95, LFTs wnl (atorvastatin 80mg  daily) 12/14/19: TC 222, TG 173, HDL 59, LDL 132, LFTs wnl (rosuvastatin 40 mg daily, fish oil 1g BID) 04/04/20: TC 194, TG 120, HDL 58, LDL 115, LFTs wnl (rosuvastatin 40 mg, fish oil 1g BID, ezetimibe 10 mg daily)  Past Medical History:  Diagnosis Date  . Anemia   . Asthma   . Esophagitis   . GERD (gastroesophageal reflux disease)   . HTN (hypertension)   . Hyperplastic colon polyp   . 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    Cor CTA 05/2019: Ca score 81.5 (96th percentile);  mRCA < 25; dLM < 25; oLAD 25-49, pLAD < 25    Current Outpatient Medications on File Prior to Visit  Medication Sig Dispense Refill  . acetaminophen (TYLENOL 8 HOUR) 650 MG CR tablet Take 1 tablet (650 mg total) by mouth every 8 (eight) hours as needed for pain. 30 tablet 1  . albuterol (PROAIR HFA) 108 (90 Base) MCG/ACT inhaler inhale 2 puffs every 4 hours if needed wheezing or shortness of breath 8.5 g 3  . alum & mag hydroxide-simeth (MAALOX/MYLANTA) 200-200-20 MG/5ML suspension Take 30 mLs by mouth every 6 (six) hours as needed for indigestion or heartburn. Reported on 10/26/2015 355 mL 1  . aspirin EC 81 MG tablet Take 1 tablet (81 mg total) by mouth daily. 30 tablet 1  . buPROPion (WELLBUTRIN) 75 MG tablet TAKE 1 TABLET(75 MG) BY MOUTH TWICE DAILY 60 tablet 1  . ezetimibe (ZETIA) 10 MG tablet Take 1 tablet (10 mg total) by mouth daily. 90 tablet 3  . furosemide (LASIX) 20 MG tablet TAKE 1 TABLET(20 MG) BY MOUTH DAILY 90 tablet 1  . gabapentin (NEURONTIN) 300 MG capsule TAKE 1 CAPSULE(300 MG) BY MOUTH AT BEDTIME 30 capsule 5  . ibuprofen (ADVIL) 600 MG tablet Take 1 tablet (600 mg total) by mouth every 6 (six) hours as needed.  30 tablet 0  . metoprolol tartrate (LOPRESSOR) 100 MG tablet One time only two hours prior to test. 1 tablet 0  . pantoprazole (PROTONIX) 40 MG tablet TAKE 1 TABLET BY MOUTH TWICE DAILY BEFORE A MEAL 180 tablet 0  . rosuvastatin (CRESTOR) 40 MG tablet Take 1 tablet (40 mg total) by mouth daily. 90 tablet 3  . traMADol (ULTRAM) 50 MG tablet Take 1 tablet by mouth every 12 hours if needed. 60 tablet 0   No current facility-administered medications on file prior to visit.    Allergies  Allergen Reactions  . Lisinopril Swelling    Assessment/Plan:  1. Hyperlipidemia - LDL is elevated and above goal of < 70 mg/dL. Will submit prior authorization for Repatha 140 mg injections every 14 days. Will also switch over-the-counter fish oil to icosapent ethyl 2 gm BID for  lower cost and better CV benefit. Continue rosuvastatin 40 mg daily and ezetimibe 10 mg daily - sent in refills for both. Discussed the importance of a heart-healthy diet - patient will decrease the amount of fried food in her diet. Encouraged her to stay active as her knee pain allows. Will call patient once prior authorization is approved and schedule follow-up fasting labs at that time.  2. Smoking Cessation - Educated patient on the importance of smoking cessation and patient will try NRT gum to help decrease cigarette usage. Patient knows that she can call 1-800-QUIT-NOW to get free nicotine gum.  Richardine Service, PharmD PGY2 Cardiology Pharmacy Resident

## 2020-04-25 ENCOUNTER — Other Ambulatory Visit: Payer: Self-pay

## 2020-04-25 ENCOUNTER — Ambulatory Visit (INDEPENDENT_AMBULATORY_CARE_PROVIDER_SITE_OTHER): Payer: Medicare Other | Admitting: Pharmacist

## 2020-04-25 ENCOUNTER — Telehealth: Payer: Self-pay

## 2020-04-25 DIAGNOSIS — E782 Mixed hyperlipidemia: Secondary | ICD-10-CM

## 2020-04-25 DIAGNOSIS — I2510 Atherosclerotic heart disease of native coronary artery without angina pectoris: Secondary | ICD-10-CM | POA: Diagnosis not present

## 2020-04-25 MED ORDER — ROSUVASTATIN CALCIUM 40 MG PO TABS: 40.0000 mg | ORAL_TABLET | Freq: Every day | ORAL | 3 refills | Status: DC

## 2020-04-25 MED ORDER — REPATHA SURECLICK 140 MG/ML ~~LOC~~ SOAJ: 140.0000 mg | SUBCUTANEOUS | 11 refills | Status: DC

## 2020-04-25 MED ORDER — ICOSAPENT ETHYL 1 G PO CAPS: 2.0000 g | ORAL_CAPSULE | Freq: Two times a day (BID) | ORAL | 11 refills | Status: DC

## 2020-04-25 MED ORDER — EZETIMIBE 10 MG PO TABS: 10.0000 mg | ORAL_TABLET | Freq: Every day | ORAL | 3 refills | Status: DC

## 2020-04-25 NOTE — Telephone Encounter (Signed)
Called and lmomed the pt to start the repatha rx sent and instructed the pt to callback if the med is costly

## 2020-04-25 NOTE — Patient Instructions (Addendum)
It was nice to meet you today   Your LDL is 115 and your goal is < 70 mg/dL   I will submit information to your insurance to see if they will cover Repatha injections. This medication is stored in the fridge, is given every 2 weeks into the fatty tissue of your stomach, and lowers your LDL cholesterol by 60%  Start taking icosapent ethyl (prescription fish oil) 2 gm twice daily. Stop taking your over-the-counter fish oil.  Continue taking rosuvastatin 40 mg daily and ezetimibe 10 mg daily   We will plan to recheck your cholesterol 6-8 weeks after starting the injections. I will give you a call later this week to schedule follow-up labs once insurance approves Repatha.

## 2020-04-27 ENCOUNTER — Telehealth: Payer: Self-pay | Admitting: Pharmacist

## 2020-04-27 DIAGNOSIS — I2510 Atherosclerotic heart disease of native coronary artery without angina pectoris: Secondary | ICD-10-CM

## 2020-04-27 NOTE — Telephone Encounter (Signed)
Called patient to let her know prior authorization for Repatha injections approved and Rx sent to preferred pharmacy. Pt understands and will pick Rx up tomorrow morning. Scheduled follow-up fasting labs for 06/09/20.

## 2020-05-04 ENCOUNTER — Telehealth: Payer: Self-pay | Admitting: Cardiovascular Disease

## 2020-05-04 NOTE — Telephone Encounter (Signed)
Returned a call to pt and stated that they will administer the repatha once every 14 days and they voiced understanding. They were confused about the dose frequency but the issue was rectified

## 2020-05-04 NOTE — Telephone Encounter (Signed)
Patient is requesting to discuss instructions on how to properly inject Evolocumab (REPATHA SURECLICK) 868 MG/ML SOAJ medication.

## 2020-05-09 ENCOUNTER — Ambulatory Visit: Payer: Medicare Other | Admitting: Podiatry

## 2020-05-13 ENCOUNTER — Ambulatory Visit: Payer: Medicare Other | Admitting: Family Medicine

## 2020-05-18 ENCOUNTER — Ambulatory Visit: Payer: Medicare Other | Admitting: Podiatry

## 2020-05-18 ENCOUNTER — Ambulatory Visit: Payer: Medicare Other | Admitting: Family Medicine

## 2020-05-18 NOTE — Progress Notes (Deleted)
    SUBJECTIVE:   CHIEF COMPLAINT / HPI:   ***  PERTINENT  PMH / PSH: ***  OBJECTIVE:   LMP 02/21/2015   ***  ASSESSMENT/PLAN:   No problem-specific Assessment & Plan notes found for this encounter.     Patriciaann Clan, [place]

## 2020-06-09 ENCOUNTER — Other Ambulatory Visit: Payer: Self-pay

## 2020-06-09 ENCOUNTER — Other Ambulatory Visit: Payer: Medicare Other | Admitting: *Deleted

## 2020-06-09 DIAGNOSIS — I2510 Atherosclerotic heart disease of native coronary artery without angina pectoris: Secondary | ICD-10-CM

## 2020-06-10 ENCOUNTER — Telehealth: Payer: Self-pay | Admitting: Pharmacist

## 2020-06-10 LAB — LIPID PANEL
Chol/HDL Ratio: 1.8 ratio (ref 0.0–4.4)
Cholesterol, Total: 106 mg/dL (ref 100–199)
HDL: 58 mg/dL (ref >39)
LDL Chol Calc (NIH): 21 mg/dL (ref 0–99)
Triglycerides: 166 mg/dL — ABNORMAL HIGH (ref 0–149)
VLDL Cholesterol Cal: 27 mg/dL (ref 5–40)

## 2020-06-10 LAB — HEPATIC FUNCTION PANEL
ALT: 25 IU/L (ref 0–32)
AST: 21 IU/L (ref 0–40)
Albumin: 4.5 g/dL (ref 3.8–4.9)
Alkaline Phosphatase: 82 IU/L (ref 44–121)
Bilirubin Total: 0.5 mg/dL (ref 0.0–1.2)
Bilirubin, Direct: 0.19 mg/dL (ref 0.00–0.40)
Total Protein: 6.4 g/dL (ref 6.0–8.5)

## 2020-06-10 NOTE — Telephone Encounter (Signed)
Called pt to review lipid labs with patient. LFT WNL. LDL down to 21! Should continue Repatha, rosuvastatin 40mg , zetia 10mg  daily and Vascepa 2g BID. TG slightly elevated - will need to make sure pt is taking vascepa Left message for patient to call back.

## 2020-06-13 ENCOUNTER — Other Ambulatory Visit: Payer: Self-pay | Admitting: *Deleted

## 2020-06-13 DIAGNOSIS — F17200 Nicotine dependence, unspecified, uncomplicated: Secondary | ICD-10-CM

## 2020-06-13 DIAGNOSIS — K219 Gastro-esophageal reflux disease without esophagitis: Secondary | ICD-10-CM

## 2020-06-14 MED ORDER — BUPROPION HCL 75 MG PO TABS: ORAL_TABLET | ORAL | 1 refills | Status: DC

## 2020-06-14 MED ORDER — PANTOPRAZOLE SODIUM 40 MG PO TBEC: DELAYED_RELEASE_TABLET | ORAL | 1 refills | Status: DC

## 2020-06-14 NOTE — Telephone Encounter (Signed)
Patient called back.  Relayed lab results and that patient should continue current medications.  Reports she is currently taking her Vascepa

## 2020-06-29 ENCOUNTER — Other Ambulatory Visit: Payer: Self-pay | Admitting: Family Medicine

## 2020-06-29 DIAGNOSIS — Z1231 Encounter for screening mammogram for malignant neoplasm of breast: Secondary | ICD-10-CM

## 2020-07-01 ENCOUNTER — Ambulatory Visit (INDEPENDENT_AMBULATORY_CARE_PROVIDER_SITE_OTHER): Payer: Medicare Other

## 2020-07-01 ENCOUNTER — Other Ambulatory Visit: Payer: Self-pay

## 2020-07-01 ENCOUNTER — Ambulatory Visit (INDEPENDENT_AMBULATORY_CARE_PROVIDER_SITE_OTHER): Payer: Medicare Other | Admitting: Podiatry

## 2020-07-01 DIAGNOSIS — M19079 Primary osteoarthritis, unspecified ankle and foot: Secondary | ICD-10-CM

## 2020-07-01 DIAGNOSIS — S99922A Unspecified injury of left foot, initial encounter: Secondary | ICD-10-CM

## 2020-07-01 DIAGNOSIS — M19072 Primary osteoarthritis, left ankle and foot: Secondary | ICD-10-CM | POA: Diagnosis not present

## 2020-07-05 ENCOUNTER — Encounter: Payer: Self-pay | Admitting: Podiatry

## 2020-07-05 NOTE — Progress Notes (Signed)
Subjective:  Patient ID: Kendra [name], female    DOB: 1966-04-21,  MRN: [id number]  No chief complaint on file.   54 y.o. female presents with the above complaint.  Patient presents with pain to the left dorsal midfoot.  Patient states started 2 to 3 months ago there is some burning pain with extreme sensation.  Patient states she has had surgery back in 1987 where she gained weight and causing more pressure on the entire ankle.  She dropped a can on top of the left foot as well which may have aggravated it few months ago.  But this has been going on for quite some time.  She denies any other acute complaints.  She has not been seen prior to [place] me for this particular reason.  She was last seen last year by Dr. Amalia Hailey.  She would like to discuss treatment options.   Review of Systems: Negative except as noted in the HPI. Denies N/V/F/Ch.  Past Medical History:  Diagnosis Date  . Anemia   . Asthma   . Esophagitis   . GERD (gastroesophageal reflux disease)   . HTN (hypertension)   . Hyperplastic colon polyp   . 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    Cor CTA 05/2019: Ca score 81.5 (96th percentile); mRCA < 25; dLM < 25; oLAD 25-49, pLAD < 25    Current Outpatient Medications:  .  acetaminophen (TYLENOL 8 HOUR) 650 MG CR tablet, Take 1 tablet (650 mg total) by mouth every 8 (eight) hours as needed for pain., Disp: 30 tablet, Rfl: 1 .  albuterol (PROAIR HFA) 108 (90 Base) MCG/ACT inhaler, inhale 2 puffs every 4 hours if needed wheezing or shortness of breath, Disp: 8.5 g, Rfl: 3 .  alum & mag hydroxide-simeth (MAALOX/MYLANTA) 200-200-20 MG/5ML suspension, Take 30 mLs by mouth every 6 (six) hours as needed for indigestion or heartburn. Reported on 10/26/2015, Disp: 355 mL, Rfl: 1 .  aspirin EC 81 MG tablet, Take 1 tablet (81 mg total) by mouth daily., Disp: 30 tablet, Rfl: 1 .  buPROPion (WELLBUTRIN) 75 MG tablet, TAKE 1 TABLET(75 MG) BY MOUTH TWICE DAILY, Disp: 60 tablet, Rfl: 1 .   Evolocumab (REPATHA SURECLICK) 409 MG/ML SOAJ, Inject 140 mg into the skin every 14 (fourteen) days., Disp: 2.1 mL, Rfl: 11 .  ezetimibe (ZETIA) 10 MG tablet, Take 1 tablet (10 mg total) by mouth daily., Disp: 90 tablet, Rfl: 3 .  furosemide (LASIX) 20 MG tablet, TAKE 1 TABLET(20 MG) BY MOUTH DAILY, Disp: 90 tablet, Rfl: 1 .  gabapentin (NEURONTIN) 300 MG capsule, TAKE 1 CAPSULE(300 MG) BY MOUTH AT BEDTIME, Disp: 30 capsule, Rfl: 5 .  ibuprofen (ADVIL) 600 MG tablet, Take 1 tablet (600 mg total) by mouth every 6 (six) hours as needed., Disp: 30 tablet, Rfl: 0 .  icosapent Ethyl (VASCEPA) 1 g capsule, Take 2 capsules (2 g total) by mouth 2 (two) times daily., Disp: 120 capsule, Rfl: 11 .  metoprolol tartrate (LOPRESSOR) 100 MG tablet, One time only two hours prior to test., Disp: 1 tablet, Rfl: 0 .  pantoprazole (PROTONIX) 40 MG tablet, TAKE 1 TABLET BY MOUTH TWICE DAILY BEFORE A MEAL, Disp: 120 tablet, Rfl: 1 .  rosuvastatin (CRESTOR) 40 MG tablet, Take 1 tablet (40 mg total) by mouth daily., Disp: 90 tablet, Rfl: 3 .  traMADol (ULTRAM) 50 MG tablet, Take 1 tablet by mouth every 12 hours if needed., Disp: 60 tablet, Rfl: 0  Social History   Tobacco  Use  Smoking Status Current Every Day Smoker  . Packs/day: 1.50  . Types: Cigarettes  . Start date: 08/27/2002  Smokeless Tobacco Never Used  Tobacco Comment   cut back to 4-5 cigs per day from 1ppd    Allergies  Allergen Reactions  . Lisinopril Swelling   Objective:  There were no vitals filed for this visit. There is no height or weight on file to calculate BMI. Constitutional Well developed. Well nourished.  Vascular Dorsalis pedis pulses palpable bilaterally. Posterior tibial pulses palpable bilaterally. Capillary refill normal to all digits.  No cyanosis or clubbing noted. Pedal hair growth normal.  Neurologic Normal speech. Oriented to person, place, and time. Epicritic sensation to light touch grossly present bilaterally.    Dermatologic Nails well groomed and normal in appearance. No open wounds. No skin lesions.  Orthopedic:  Pain on palpation to the left dorsal midfoot.  No pain with range of motion of the digits including resisted dorsiflexion plantarflexion.  No pain at the tarsometatarsal joints.   Radiographs: 3 views of skeletally mature adult foot: Midfoot arthritis noted mild to moderate in nature.  No other bony abnormalities noted.  There is decreasing calcaneal clinician angle increase in talar declination angle anterior break in the cyma line consistent with pes planovalgus foot structure. Assessment:   1. Injury of left foot, initial encounter   2. Arthritis of midfoot    Plan:  Patient was evaluated and treated and all questions answered.  Left midfoot arthritis with underlying injury to the left foot -I explained to the patient the etiology of arthritis and various treatment options were discussed.  I believe that dropping the can on the left dorsal foot may have aggravated the underlying arthritis leading to continuous inflammation.  Given the amount of pain that she is having I believe patient will benefit from a steroid injection to help decrease acute inflammatory component associate with pain.  Patient agrees with the plan would like to proceed with a steroid injection -A steroid injection was performed at left midfoot using 1% plain Lidocaine and 10 mg of Kenalog. This was well tolerated.  No follow-ups on file.

## 2020-07-29 ENCOUNTER — Ambulatory Visit: Payer: Medicare Other | Admitting: Podiatry

## 2020-08-09 ENCOUNTER — Ambulatory Visit: Payer: Medicare Other

## 2020-08-25 ENCOUNTER — Ambulatory Visit: Payer: Medicare Other

## 2020-10-14 ENCOUNTER — Inpatient Hospital Stay: Admission: RE | Admit: 2020-10-14 | Payer: Medicare Other | Source: Ambulatory Visit

## 2020-10-21 ENCOUNTER — Ambulatory Visit: Payer: Medicare Other

## 2020-10-21 NOTE — Progress Notes (Deleted)
     SUBJECTIVE:   CHIEF COMPLAINT / HPI:   Kendra [name] is a 55 y.o. female presents with ***   Headache  Onset: *** Location: *** Quality: *** Frequency: *** Precipitating factors: *** rior treatment: ***  Associated Symptoms Nausea/vomiting: {YES/NO/WILD CARDS:18581}  Photophobia/phonophobia: {YES/NO/WILD CARDS:18581}  Tearing of eyes: {YES/NO/WILD CARDS:18581}  Sinus pain/pressure: {YES/NO/WILD CARDS:18581}  Family hx migraine: {YES/NO/WILD NKNLZ:76734}  Personal stressors: {YES/NO/WILD CARDS:18581}  Relation to menstrual cycle: {YES/NO/WILD CARDS:18581}   Red Flags Fever: {YES/NO/WILD CARDS:18581}  Neck pain/stiffness: {YES/NO/WILD CARDS:18581}  Vision/speech/swallow/hearing difficulty: {YES/NO/WILD CARDS:18581}  Focal weakness/numbness: {YES/NO/WILD CARDS:18581}  Altered mental status: {YES/NO/WILD CARDS:18581}  Trauma: {YES/NO/WILD CARDS:18581}  New type of headache: {YES/NO/WILD CARDS:18581}  Anticoagulant use: {YES/NO/WILD LPFXT:02409}  H/o cancer/HIV/Pregnancy: {YES/NO/WILD BDZHG:99242}   PERTINENT  PMH / PSH:   OBJECTIVE:   LMP 02/21/2015    General: Alert, no acute distress Cardio: Normal S1 and S2, RRR, no r/m/g Pulm: CTAB, normal work of breathing Abdomen: Bowel sounds normal. Abdomen soft and non-tender.  Extremities: No peripheral edema.  Neuro: Cranial nerves grossly intact   ASSESSMENT/PLAN:   No problem-specific Assessment & Plan notes found for this encounter.     Lattie Haw, MD PGY-2 [place]

## 2020-10-24 ENCOUNTER — Emergency Department (HOSPITAL_COMMUNITY): Payer: Medicare Other

## 2020-10-24 ENCOUNTER — Telehealth: Payer: Self-pay

## 2020-10-24 ENCOUNTER — Emergency Department (HOSPITAL_COMMUNITY)
Admission: EM | Admit: 2020-10-24 | Discharge: 2020-10-24 | Disposition: A | Discharge status: Home / Self Care | Payer: Medicare Other | Attending: Emergency Medicine | Admitting: Emergency Medicine

## 2020-10-24 ENCOUNTER — Encounter (HOSPITAL_COMMUNITY): Payer: Self-pay | Admitting: Emergency Medicine

## 2020-10-24 ENCOUNTER — Other Ambulatory Visit: Payer: Self-pay

## 2020-10-24 DIAGNOSIS — I10 Essential (primary) hypertension: Secondary | ICD-10-CM | POA: Insufficient documentation

## 2020-10-24 DIAGNOSIS — M25562 Pain in left knee: Secondary | ICD-10-CM | POA: Insufficient documentation

## 2020-10-24 DIAGNOSIS — M6281 Muscle weakness (generalized): Secondary | ICD-10-CM | POA: Diagnosis not present

## 2020-10-24 DIAGNOSIS — Z79899 Other long term (current) drug therapy: Secondary | ICD-10-CM | POA: Diagnosis not present

## 2020-10-24 DIAGNOSIS — Z7982 Long term (current) use of aspirin: Secondary | ICD-10-CM | POA: Diagnosis not present

## 2020-10-24 DIAGNOSIS — F17210 Nicotine dependence, cigarettes, uncomplicated: Secondary | ICD-10-CM | POA: Insufficient documentation

## 2020-10-24 DIAGNOSIS — R0789 Other chest pain: Secondary | ICD-10-CM | POA: Insufficient documentation

## 2020-10-24 DIAGNOSIS — R519 Headache, unspecified: Secondary | ICD-10-CM | POA: Insufficient documentation

## 2020-10-24 DIAGNOSIS — J45909 Unspecified asthma, uncomplicated: Secondary | ICD-10-CM | POA: Diagnosis not present

## 2020-10-24 DIAGNOSIS — R079 Chest pain, unspecified: Secondary | ICD-10-CM | POA: Diagnosis not present

## 2020-10-24 LAB — BASIC METABOLIC PANEL
Anion gap: 10 (ref 5–15)
BUN: 9 mg/dL (ref 6–20)
CO2: 22 mmol/L (ref 22–32)
Calcium: 10.1 mg/dL (ref 8.9–10.3)
Chloride: 106 mmol/L (ref 98–111)
Creatinine, Ser: 0.77 mg/dL (ref 0.44–1.00)
GFR, Estimated: >60 mL/min (ref >60)
Glucose, Bld: 108 mg/dL — ABNORMAL HIGH (ref 70–99)
Potassium: 4.2 mmol/L (ref 3.5–5.1)
Sodium: 138 mmol/L (ref 135–145)

## 2020-10-24 LAB — CBC
HCT: 44.8 % (ref 36.0–46.0)
Hemoglobin: 15.2 g/dL — ABNORMAL HIGH (ref 12.0–15.0)
MCH: 34.4 pg — ABNORMAL HIGH (ref 26.0–34.0)
MCHC: 33.9 g/dL (ref 30.0–36.0)
MCV: 101.4 fL — ABNORMAL HIGH (ref 80.0–100.0)
Platelets: 241 10*3/uL (ref 150–400)
RBC: 4.42 MIL/uL (ref 3.87–5.11)
RDW: 12.6 % (ref 11.5–15.5)
WBC: 6.3 10*3/uL (ref 4.0–10.5)
nRBC: 0 % (ref 0.0–0.2)

## 2020-10-24 LAB — TROPONIN I (HIGH SENSITIVITY)
Troponin I (High Sensitivity): <2 ng/L (ref <18)
Troponin I (High Sensitivity): <2 ng/L (ref <18)

## 2020-10-24 MED ORDER — DEXAMETHASONE 4 MG PO TABS
10.0000 mg | ORAL_TABLET | Freq: Once | ORAL | Status: AC
  Administered 2020-10-24: 10 mg via ORAL
  Filled 2020-10-24: qty 3

## 2020-10-24 MED ORDER — DEXAMETHASONE 4 MG PO TABS: 10.0000 mg | ORAL_TABLET | Freq: Once | ORAL | Status: DC

## 2020-10-24 MED ORDER — DIPHENHYDRAMINE HCL 50 MG/ML IJ SOLN
25.0000 mg | Freq: Once | INTRAMUSCULAR | Status: AC
  Administered 2020-10-24: 25 mg via INTRAVENOUS
  Filled 2020-10-24: qty 1

## 2020-10-24 MED ORDER — PROCHLORPERAZINE EDISYLATE 10 MG/2ML IJ SOLN
10.0000 mg | Freq: Once | INTRAMUSCULAR | Status: AC
  Administered 2020-10-24: 10 mg via INTRAVENOUS
  Filled 2020-10-24: qty 2

## 2020-10-24 NOTE — Telephone Encounter (Signed)
Patient calls nurse line reporting "severe" headache, chest pains, and left arm numbness. Patient reports she has a ride coming at 1pm to take her to ED. I advised patient she should not wait and to call 911. Patient agreed with plan.

## 2020-10-24 NOTE — ED Provider Notes (Signed)
[facility] EMERGENCY DEPARTMENT Provider Note   CSN: [id number] Arrival date & time: 10/24/20  1250     History Chief Complaint  Patient presents with   Chest Pain   Arm Pain   Headache    Kendra [name] is a 55 y.o. female.  55 yo F with a chief complaints of headache.  Is been going on for a couple weeks.  She unfortunately has had chronic headaches since she had a head injury when she was 55.  Has never seen a neurologist for this.  She also has been having some left-sided chest pain that she describes as sharp radiates to her shoulder and is worse with shoulder movement.  She denies trauma.  Denies cough congestion or fever.  Denies neck pain.  She went to see her family doctor on Friday but she felt too bad to get out of the car.  Has never been to the emergency department for her headaches previously.  She denies sudden onset.  Feels like her prior headaches.  Has been getting progressively worse over the past couple weeks or so.  Worse with bright lights and loud noises.  Diffusely around the frontal area.  The history is provided by the patient.  Chest Pain Associated symptoms: headache   Associated symptoms: no dizziness, no fever, no nausea, no palpitations, no shortness of breath and no vomiting   Arm Pain Associated symptoms include chest pain and headaches. Pertinent negatives include no shortness of breath.  Headache Pain location:  Frontal Quality:  Dull and sharp Radiates to:  Does not radiate Severity currently:  10/10 Severity at highest:  10/10 Onset quality:  Gradual Duration:  2 weeks Timing:  Intermittent Progression:  Waxing and waning Chronicity:  Chronic Similar to prior headaches: yes   Context: bright light   Relieved by:  Nothing Worsened by:  Nothing Ineffective treatments:  None tried Associated symptoms: myalgias   Associated symptoms: no congestion, no dizziness, no fever, no nausea and no vomiting        Past  Medical History:  Diagnosis Date   Anemia    Asthma    Esophagitis    GERD (gastroesophageal reflux disease)    HTN (hypertension)    Hyperplastic colon polyp    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    Cor CTA 05/2019: Ca score 81.5 (96th percentile); mRCA < 25; dLM < 25; oLAD 25-49, pLAD < 25    Patient Active Problem List   Diagnosis Date Noted   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    Subcutaneous mass of both lower extremities 01/30/2019   Headache 09/25/2018   Hyperlipidemia 06/18/2018   High risk medication use 02/18/2017   Neck pain on left side 02/18/2017   HTN (hypertension) 04/03/2016   Swelling of lower extremity 02/15/2016   Hemangioma of liver 07/07/2015   Uterine fibroid 01/14/2015   Dysfunctional uterine bleeding 01/14/2015   Tendinopathy of right rotator cuff 08/24/2014   DOE (dyspnea on exertion) 06/01/2013   Cervical neck pain with evidence of disc disease 05/14/2012   Osteoarthrosis, unspecified whether generalized or localized, involving lower leg 05/14/2012   Chronic low back pain 12/20/2010   Morbid obesity ([place]) 04/07/2008   ALCOHOL ABUSE 04/07/2008   TOBACCO ABUSE 04/07/2008   Iron deficiency anemia 10/24/2006   Gastroesophageal reflux disease 10/24/2006    Past Surgical History:  Procedure Laterality Date   ABDOMINAL HYSTERECTOMY  04/12/15   ANKLE SURGERY Left    CESAREAN SECTION  CYSTOSCOPY N/A 04/12/2015   Procedure: CYSTOSCOPY;  Surgeon: Lavonia Drafts, MD;  Location: [facility];  Service: Gynecology;  Laterality: N/A;   FINGER SURGERY     TIBIA FRACTURE SURGERY     TUBAL LIGATION       OB History    Gravida  3   Para  3   Term  3   Preterm  0   AB  0   Living  3     SAB  0   IAB  0   Ectopic  0   Multiple  0   Live Births              Family History  Problem Relation Age of Onset   Diabetes Mother    Heart disease Mother    Lung cancer Father 66    Diabetes Sister    Breast cancer Neg Hx    Stomach cancer Neg Hx    Colon cancer Neg Hx    Pancreatic cancer Neg Hx     Social History   Tobacco Use   Smoking status: Current Every Day Smoker    Packs/day: 1.50    Types: Cigarettes    Start date: 08/27/2002   Smokeless tobacco: Never Used   Tobacco comment: cut back to 4-5 cigs per day from 1ppd  Vaping Use   Vaping Use: Never used  Substance Use Topics   Alcohol use: Yes    Alcohol/week: 0.0 standard drinks    Comment: 2 -40oz daily,  24oz daily.     Drug use: No    Home Medications Prior to Admission medications   Medication Sig Start Date End Date Taking? Authorizing Provider  acetaminophen (TYLENOL 8 HOUR) 650 MG CR tablet Take 1 tablet (650 mg total) by mouth every 8 (eight) hours as needed for pain. 01/04/20   Sherene Sires, DO  albuterol (PROAIR HFA) [phone] Base) MCG/ACT inhaler inhale 2 puffs every 4 hours if needed wheezing or shortness of breath 01/04/20   Sherene Sires, DO  alum & mag hydroxide-simeth (MAALOX/MYLANTA) 200-200-20 MG/5ML suspension Take 30 mLs by mouth every 6 (six) hours as needed for indigestion or heartburn. Reported on 10/26/2015 01/04/20   Sherene Sires, DO  aspirin EC 81 MG tablet Take 1 tablet (81 mg total) by mouth daily. 06/08/19   Richardson Dopp T, PA-C  buPROPion (WELLBUTRIN) 75 MG tablet TAKE 1 TABLET(75 MG) BY MOUTH TWICE DAILY 06/14/20   Patriciaann Clan, DO  Evolocumab (REPATHA SURECLICK) 606 MG/ML SOAJ Inject 140 mg into the skin every 14 (fourteen) days. 04/25/20   Sherren Mocha, MD  ezetimibe (ZETIA) 10 MG tablet Take 1 tablet (10 mg total) by mouth daily. 04/25/20   Sherren Mocha, MD  furosemide (LASIX) 20 MG tablet TAKE 1 TABLET(20 MG) BY MOUTH DAILY 04/04/20   Patriciaann Clan, DO  gabapentin (NEURONTIN) 300 MG capsule TAKE 1 CAPSULE(300 MG) BY MOUTH AT BEDTIME 01/04/20   Bland, Scott, DO  ibuprofen (ADVIL) 600 MG tablet Take 1 tablet (600 mg total) by mouth every 6 (six) hours as  needed. 12/07/19   Joy, Shawn C, PA-C  icosapent Ethyl (VASCEPA) 1 g capsule Take 2 capsules (2 g total) by mouth 2 (two) times daily. 04/25/20   Sherren Mocha, MD  metoprolol tartrate (LOPRESSOR) 100 MG tablet One time only two hours prior to test. 05/01/19   Richardson Dopp T, PA-C  pantoprazole (PROTONIX) 40 MG tablet TAKE 1 TABLET BY MOUTH TWICE DAILY BEFORE A MEAL  06/14/20   Patriciaann Clan, DO  rosuvastatin (CRESTOR) 40 MG tablet Take 1 tablet (40 mg total) by mouth daily. 04/25/20 07/24/20  Sherren Mocha, MD  traMADol (ULTRAM) 50 MG tablet Take 1 tablet by mouth every 12 hours if needed. 10/27/19   Thurman Coyer, DO    Allergies    Lisinopril  Review of Systems   Review of Systems  Constitutional: Negative for chills and fever.  HENT: Negative for congestion and rhinorrhea.   Eyes: Negative for redness and visual disturbance.  Respiratory: Negative for shortness of breath and wheezing.   Cardiovascular: Positive for chest pain. Negative for palpitations.  Gastrointestinal: Negative for nausea and vomiting.  Genitourinary: Negative for dysuria and urgency.  Musculoskeletal: Positive for arthralgias and myalgias.  Skin: Negative for pallor and wound.  Neurological: Positive for headaches. Negative for dizziness.    Physical Exam Updated Vital Signs BP (!) 107/57    Pulse 64    Temp (!) 97.5 F (36.4 C) (Oral)    Resp 17    Ht 5\' 4"  (1.626 m)    Wt 125.2 kg    LMP 02/21/2015    SpO2 100%    BMI 47.38 kg/m   Physical Exam Vitals and nursing note reviewed.  Constitutional:      General: She is not in acute distress.    Appearance: She is well-developed and well-nourished. She is not diaphoretic.  HENT:     Head: Normocephalic and atraumatic.  Eyes:     Extraocular Movements: EOM normal.     Pupils: Pupils are equal, round, and reactive to light.  Cardiovascular:     Rate and Rhythm: Normal rate and regular rhythm.     Heart sounds: No murmur heard. No friction rub. No  gallop.   Pulmonary:     Effort: Pulmonary effort is normal.     Breath sounds: No wheezing or rales.  Abdominal:     General: There is no distension.     Palpations: Abdomen is soft.     Tenderness: There is no abdominal tenderness.  Musculoskeletal:        General: No tenderness or edema.     Cervical back: Normal range of motion and neck supple.  Skin:    General: Skin is warm and dry.  Neurological:     Mental Status: She is alert and oriented to person, place, and time.     Cranial Nerves: Cranial nerves are intact.     Sensory: Sensation is intact.     Motor: Motor function is intact.     Coordination: Coordination is intact.     Comments: Left leg weakness secondary to left knee pain.  Otherwise benign neurologic exam.  Psychiatric:        Mood and Affect: Mood and affect normal.        Behavior: Behavior normal.     ED Results / Procedures / Treatments   Labs (all labs ordered are listed, but only abnormal results are displayed) Labs Reviewed  BASIC METABOLIC PANEL - Abnormal; Notable for the following components:      Result Value   Glucose, Bld 108 (*)    All other components within normal limits  CBC - Abnormal; Notable for the following components:   Hemoglobin 15.2 (*)    MCV 101.4 (*)    MCH 34.4 (*)    All other components within normal limits  TROPONIN I (HIGH SENSITIVITY)  TROPONIN I (HIGH SENSITIVITY)    EKG EKG  Interpretation  Date/Time:  Monday October 24 2020 13:07:51 EST Ventricular Rate:  95 PR Interval:  168 QRS Duration: 70 QT Interval:  350 QTC Calculation: 439 R Axis:   57 Text Interpretation: Normal sinus rhythm Right atrial enlargement Borderline ECG No significant change since last tracing Confirmed by Deno Etienne [phone]) on 10/24/2020 3:04:31 PM   Radiology DG Chest 2 View  Result Date: 10/24/2020 CLINICAL DATA:  Chest pain for 2 weeks EXAM: CHEST - 2 VIEW COMPARISON:  03/27/2019 FINDINGS: The heart size and mediastinal contours  are within normal limits. Both lungs are clear. The visualized skeletal structures are unremarkable. IMPRESSION: No active cardiopulmonary disease. Electronically Signed   By: Davina Poke D.O.   On: 10/24/2020 13:54    Procedures Procedures   Medications Ordered in ED Medications  dexamethasone (DECADRON) tablet 10 mg (has no administration in time range)  prochlorperazine (COMPAZINE) injection 10 mg (10 mg Intravenous Given 10/24/20 1711)  diphenhydrAMINE (BENADRYL) injection 25 mg (25 mg Intravenous Given 10/24/20 1711)  dexamethasone (DECADRON) tablet 10 mg (10 mg Oral Given 10/24/20 1710)    ED Course  I have reviewed the triage vital signs and the nursing notes.  Pertinent labs & imaging results that were available during my care of the patient were reviewed by me and considered in my medical decision making (see chart for details).    MDM Rules/Calculators/A&P                          55 yo F with a chief complaints of headache.  Going on for a couple weeks.  Also has had some chest pain off and on that is left-sided.  Chest pain is atypical in nature and reproduced on exam.  Likely musculoskeletal by history.  EKG is unremarkable.  Troponins negative.  Chest x-ray viewed by me without focal infiltrate or pneumothorax.  The patient has some chronic left knee pain that causes her to limit her effort with the left lower extremity otherwise the patient has a benign neurologic exam.  Plan for headache cocktail and reassess.  Patient feeling somewhat better after headache cocktail.  Will discharge the patient home.  Have her follow-up with neurology as an outpatient.  5:53 PM:  I have discussed the diagnosis/risks/treatment options with the patient and believe the pt to be eligible for discharge home to follow-up with PCP, neuro. We also discussed returning to the ED immediately if new or worsening sx occur. We discussed the sx which are most concerning (e.g., sudden worsening pain,  fever, inability to tolerate by mouth) that necessitate immediate return. Medications administered to the patient during their visit and any new prescriptions provided to the patient are listed below.  Medications given during this visit Medications  dexamethasone (DECADRON) tablet 10 mg (has no administration in time range)  prochlorperazine (COMPAZINE) injection 10 mg (10 mg Intravenous Given 10/24/20 1711)  diphenhydrAMINE (BENADRYL) injection 25 mg (25 mg Intravenous Given 10/24/20 1711)  dexamethasone (DECADRON) tablet 10 mg (10 mg Oral Given 10/24/20 1710)     The patient appears reasonably screen and/or stabilized for discharge and I doubt any other medical condition or other [facility] requiring further screening, evaluation, or treatment in the ED at this time prior to discharge.   Final Clinical Impression(s) / ED Diagnoses Final diagnoses:  Frontal headache  Atypical chest pain    Rx / DC Orders ED Discharge Orders         Ordered  Ambulatory referral to Neurology       Comments: Headache syndrome x40 years with worsening   10/24/20 [place], [place], DO 10/24/20 1753

## 2020-10-24 NOTE — ED Triage Notes (Signed)
Patient coming from home. Patient complaint of chest pain, arm pain, headache x2 weeks. Patient endorses history of migraines. VSS. NAD.

## 2020-10-31 ENCOUNTER — Encounter: Payer: Self-pay | Admitting: Neurology

## 2020-11-08 ENCOUNTER — Other Ambulatory Visit: Payer: Self-pay

## 2020-11-08 DIAGNOSIS — G8929 Other chronic pain: Secondary | ICD-10-CM

## 2020-11-08 DIAGNOSIS — M545 Low back pain, unspecified: Secondary | ICD-10-CM

## 2020-11-08 MED ORDER — GABAPENTIN 300 MG PO CAPS: ORAL_CAPSULE | ORAL | 5 refills | Status: DC

## 2020-11-15 ENCOUNTER — Telehealth: Payer: Self-pay

## 2020-11-15 NOTE — Telephone Encounter (Signed)
Patient LVM on nurse line regarding pain in legs. Patient reports that pain has affected her walking. Attempted to call patient back, no answer. Left HIPAA compliant VM for patient to return call to office regarding concern.   Talbot Grumbling, RN

## 2020-12-21 ENCOUNTER — Encounter: Payer: Self-pay | Admitting: Physician Assistant

## 2020-12-21 ENCOUNTER — Other Ambulatory Visit: Payer: Self-pay

## 2020-12-21 ENCOUNTER — Ambulatory Visit (INDEPENDENT_AMBULATORY_CARE_PROVIDER_SITE_OTHER): Payer: Medicare Other | Admitting: Physician Assistant

## 2020-12-21 VITALS — BP 104/70 | HR 92 | Ht 64.0 in | Wt 292.6 lb

## 2020-12-21 DIAGNOSIS — E782 Mixed hyperlipidemia: Secondary | ICD-10-CM | POA: Diagnosis not present

## 2020-12-21 DIAGNOSIS — I2510 Atherosclerotic heart disease of native coronary artery without angina pectoris: Secondary | ICD-10-CM

## 2020-12-21 DIAGNOSIS — Z720 Tobacco use: Secondary | ICD-10-CM | POA: Diagnosis not present

## 2020-12-21 DIAGNOSIS — M2550 Pain in unspecified joint: Secondary | ICD-10-CM

## 2020-12-21 MED ORDER — ROSUVASTATIN CALCIUM 20 MG PO TABS: 20.0000 mg | ORAL_TABLET | Freq: Every day | ORAL | 3 refills | Status: DC

## 2020-12-21 MED ORDER — ASPIRIN EC 81 MG PO TBEC: 81.0000 mg | DELAYED_RELEASE_TABLET | Freq: Every day | ORAL | 3 refills | Status: AC

## 2020-12-21 NOTE — Progress Notes (Addendum)
Cardiology Office Note:    Date:  12/21/2020   ID:  Kendra [name], DOB 04/10/1966, MRN [id number]  PCP:  Clemetine Marker   [place]  Cardiologist:  Sherren Mocha, MD   Advanced Practice Provider:  Liliane Shi, PA-C Electrophysiologist:  None       Referring MD: Patriciaann Clan, DO   Chief Complaint:  Follow-up (CAD)    Patient Profile:    Kendra [name] is a 55 y.o. female with:   Chest pain   Coronary artery disease [non-obstructive] ? Myoview 6/19: Low Risk, no ischemia ? Coronary CTA 05/2019: Ca score 81.5 (96th percentile); minimal plaque (<25%) in mRCA, dLM and pLAD; mild plaque (25-49%) in oLAD  Tobacco use  Hyperlipidemia   GERD  Prior CV studies: Coronary CTA 06/05/2019 IMPRESSION: 1. Coronary calcium score of 81.5. This was 41 percentile for age and sex matched control.  2. Normal coronary origin with right dominance.  3. Mild mixed attenuation plaque in the ostium of LAD. Otherwise minimal (<25%) disease in the distal left main and RCA.  4. Recommend aggressive risk factor modification and high-potency Statin.  Echocardiogram 04/16/2019 EF 40-98, normal diastolic function, normal RVSF  Myoview 01/29/18 EF 60, attenuation artifact, no ischemia; low risk  Echocardiogram 03/02/2016 Mild LVH, EF 55-60, no RWMA, normal diastolic fn   History of Present Illness: Kendra [name] was last seen in 4/21.  She returns for follow-up.  She has a history of chronic chest discomfort.  Of note, she was in the emergency room in 09/2020 with a headache.  She had high-sensitivity troponins obtained and these were negative.  She is here today with her mother.  She notes diffuse arthralgias and stiffness.  She also has weakness in her legs at times.  She feels the pain in her shoulders in her chest.  She has not had exertional chest pain.  She has not had significant shortness of breath.  She has not had syncope, orthopnea.  She  has some leg edema and takes furosemide prn.  She still smokes.       Past Medical History:  Diagnosis Date  . Anemia   . Asthma   . Esophagitis   . GERD (gastroesophageal reflux disease)   . HTN (hypertension)   . Hyperplastic colon polyp   . 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 acetaminophen (TYLENOL 8 HOUR) 650 MG CR tablet Take 1 tablet (650 mg total) by mouth every 8 (eight) hours as needed for pain.  Marland Kitchen albuterol (PROAIR HFA) 108 (90 Base) MCG/ACT inhaler inhale 2 puffs every 4 hours if needed wheezing or shortness of breath  . alum & mag hydroxide-simeth (MAALOX/MYLANTA) 200-200-20 MG/5ML suspension Take 30 mLs by mouth every 6 (six) hours as needed for indigestion or heartburn. Reported on 10/26/2015  . aspirin EC 81 MG tablet Take 1 tablet (81 mg total) by mouth daily. Swallow whole.  Marland Kitchen buPROPion (WELLBUTRIN) 75 MG tablet TAKE 1 TABLET(75 MG) BY MOUTH TWICE DAILY  . Evolocumab (REPATHA SURECLICK) 119 MG/ML SOAJ Inject 140 mg into the skin every 14 (fourteen) days.  Marland Kitchen ezetimibe (ZETIA) 10 MG tablet Take 1 tablet (10 mg total) by mouth daily.  . furosemide (LASIX) 20 MG tablet TAKE 1 TABLET(20 MG) BY MOUTH DAILY  .  gabapentin (NEURONTIN) 300 MG capsule TAKE 1 CAPSULE(300 MG) BY MOUTH AT BEDTIME  . ibuprofen (ADVIL) 600 MG tablet Take 1 tablet (600 mg total) by mouth every 6 (six) hours as needed.  Marland Kitchen icosapent Ethyl (VASCEPA) 1 g capsule Take 2 capsules (2 g total) by mouth 2 (two) times daily.  . pantoprazole (PROTONIX) 40 MG tablet TAKE 1 TABLET BY MOUTH TWICE DAILY BEFORE A MEAL  . traMADol (ULTRAM) 50 MG tablet Take 1 tablet by mouth every 12 hours if needed.  . [DISCONTINUED] rosuvastatin (CRESTOR) 40 MG tablet Take 40 mg by mouth daily.     Allergies:   Lisinopril   Social History   Tobacco Use  . Smoking status:  Current Every Day Smoker    Packs/day: 1.50    Types: Cigarettes    Start date: 08/27/2002  . Smokeless tobacco: Never Used  . Tobacco comment: cut back to 4-5 cigs per day from 1ppd  Vaping Use  . Vaping Use: Never used  Substance Use Topics  . Alcohol use: Yes    Alcohol/week: 0.0 standard drinks    Comment: 2 -40oz daily,  24oz daily.    . Drug use: No     Family Hx: The patient's family history includes Diabetes in her mother and sister; Heart disease in her mother; Lung cancer (age of onset: 29) in her father. There is no history of Breast cancer, Stomach cancer, Colon cancer, or Pancreatic cancer.  Review of Systems  Constitutional: Negative for fever.  Gastrointestinal: Negative for hematochezia.  Genitourinary: Negative for hematuria.     EKGs/Labs/Other Test Reviewed:    EKG:  EKG is ot ordered today.  The ekg ordered today demonstrates n/a ECG obtained 10/24/20 was personally reviewed and demonstrated normal sinus rhythm, HR 95, normal axis, no ST-TW changes, QTc 439  Recent Labs: 06/09/2020: ALT 25 10/24/2020: BUN 9; Creatinine, Ser 0.77; Hemoglobin 15.2; Platelets 241; Potassium 4.2; Sodium 138   Recent Lipid Panel Lab Results  Component Value Date/Time   CHOL 106 06/09/2020 03:04 PM   TRIG 166 (H) 06/09/2020 03:04 PM   HDL 58 06/09/2020 03:04 PM   CHOLHDL 1.8 06/09/2020 03:04 PM   CHOLHDL 3.0 02/22/2016 03:38 PM   LDLCALC 21 06/09/2020 03:04 PM     Risk Assessment/Calculations:      Physical Exam:    VS:  BP 104/70   Pulse 92   Ht 5\' 4"  (1.626 m)   Wt 292 lb 9.6 oz (132.7 kg)   LMP 02/21/2015   SpO2 96%   BMI 50.22 kg/m     Wt Readings from Last 3 Encounters:  12/21/20 292 lb 9.6 oz (132.7 kg)  10/24/20 276 lb (125.2 kg)  01/04/20 280 lb 3.2 oz (127.1 kg)     Constitutional:      Appearance: Healthy appearance. Not in distress.  Neck:     Vascular: JVD normal.  Pulmonary:     Effort: Pulmonary effort is normal.     Breath sounds: No  wheezing. No rales.  Cardiovascular:     Normal rate. Regular rhythm. Normal S1. Normal S2.     Murmurs: There is no murmur.  Edema:    Peripheral edema absent.  Abdominal:     Palpations: Abdomen is soft.  Skin:    General: Skin is warm and dry.  Neurological:     Mental Status: Alert and oriented to person, place and time.     Cranial Nerves: Cranial nerves are intact.  ASSESSMENT & PLAN:    1. Coronary artery disease involving native coronary artery of native heart without angina pectoris Mild plaque on CTA in 2020.  She is not having anginal symptoms.  Continue statin.  Resume ASA 81 mg once daily. F/u 1 year.   2. Mixed hyperlipidemia LDL now 21. She has a lot of joint pains.  Decrease Rosuvastatin to 20 mg once daily to see if this helps.   3. Tobacco abuse I have again recommended cessation.  She is trying to quit.   4. Morbid obesity ([place]) She has not gotten in with the nutritionist yet.  She will d/w her PCP.    5. Multiple joint pain She has diffuse arthralgias and stiffness as well as leg weakness.  She has a f/u with her PCP soon.  I have encouraged her to keep this appt for further evaluation.  I explained that she may need imaging of her back or serology to r/o inflammatory arthritis.  As noted, I will reduce her statin dose.  If she feels better with this but has persistent symptoms, we could consider stopping her statin altogether.      Dispo:  Return in about 1 year (around 12/21/2021) for Routine follow up in one year with Margaret Pyle.Marland Kitchen   Medication Adjustments/Labs and Tests Ordered: Current medicines are reviewed at length with the patient today.  Concerns regarding medicines are outlined above.  Tests Ordered: No orders of the defined types were placed in this encounter.  Medication Changes: Meds ordered this encounter  Medications  . aspirin EC 81 MG tablet    Sig: Take 1 tablet (81 mg total) by mouth daily. Swallow whole.    Dispense:  90  tablet    Refill:  3  . rosuvastatin (CRESTOR) 20 MG tablet    Sig: Take 1 tablet (20 mg total) by mouth daily.    Dispense:  90 tablet    Refill:  3    Signed, Richardson Dopp, PA-C  12/21/2020 5:13 PM     [facility] [address], [place], [place]  [address] Phone: [phone]; Fax: [phone]

## 2020-12-21 NOTE — Patient Instructions (Signed)
Medication Instructions:   Your physician has recommended you make the following change in your medication:   1.  Start back on Aspirin one tablet by mouth ( 81 mg) daily.  2.  Decrease Crestor one tablet ( 20 mg) daily.   *If you need a refill on your cardiac medications before your next appointment, please call your pharmacy*   Lab Work: NONE  If you have labs (blood work) drawn today and your tests are completely normal, you will receive your results only by: Marland Kitchen MyChart Message (if you have MyChart) OR . A paper copy in the mail If you have any lab test that is abnormal or we need to change your treatment, we will call you to review the results.   Testing/Procedures: NONE   Follow-Up: At [facility], you and your health needs are our priority.  As part of our continuing mission to provide you with exceptional heart care, we have created designated Provider Care Teams.  These Care Teams include your primary Cardiologist (physician) and Advanced Practice Providers (APPs -  Physician Assistants and Nurse Practitioners) who all work together to provide you with the care you need, when you need it.  We recommend signing up for the patient portal called "MyChart".  Sign up information is provided on this After Visit Summary.  MyChart is used to connect with patients for Virtual Visits (Telemedicine).  Patients are able to view lab/test results, encounter notes, upcoming appointments, etc.  Non-urgent messages can be sent to your provider as well.   To learn more about what you can do with MyChart, go to [email or web address].    Your next appointment:   1 year(s)  The format for your next appointment:   In Person  Provider:   Richardson Dopp, PA-C   Other Instructions Your physician wants you to follow-up in: 1 year with Richardson Dopp, PA.  You will receive a reminder letter in the mail two months in advance. If you don't receive a letter, please call our office to schedule the  follow-up appointment.

## 2020-12-26 ENCOUNTER — Other Ambulatory Visit: Payer: Self-pay

## 2020-12-26 ENCOUNTER — Encounter: Payer: Self-pay | Admitting: Family Medicine

## 2020-12-26 ENCOUNTER — Ambulatory Visit (INDEPENDENT_AMBULATORY_CARE_PROVIDER_SITE_OTHER): Payer: Medicare Other | Admitting: Family Medicine

## 2020-12-26 DIAGNOSIS — L608 Other nail disorders: Secondary | ICD-10-CM | POA: Diagnosis not present

## 2020-12-26 DIAGNOSIS — M79604 Pain in right leg: Secondary | ICD-10-CM | POA: Diagnosis not present

## 2020-12-26 DIAGNOSIS — M79605 Pain in left leg: Secondary | ICD-10-CM | POA: Diagnosis not present

## 2020-12-26 DIAGNOSIS — R2243 Localized swelling, mass and lump, lower limb, bilateral: Secondary | ICD-10-CM | POA: Diagnosis not present

## 2020-12-26 LAB — POCT GLYCOSYLATED HEMOGLOBIN (HGB A1C): HbA1c, POC (prediabetic range): 5.7 % (ref 5.7–6.4)

## 2020-12-26 NOTE — Assessment & Plan Note (Signed)
Chronic, most prominent on right thumbnail, however present on several other nails as well.  Given bilateral distribution and appearance, less concern for underlying melanoma.  Could consider in the setting of fungal infection with thickening of her nails as well, however overall less consistent with this.  May be multifactorial in the setting of tobacco use, possible trauma, genetics/racial variation.  Provided reassurance and will observe.  Follow-up if worsening, could consider nail biopsy, B12 level evaluation in the future.

## 2020-12-26 NOTE — Assessment & Plan Note (Signed)
She previously was evaluated for a possible localized swelling in her left lower leg in 01/2019.  Had a normal U/S at that time.  Additionally today did not note any localized swelling or fluctuance, appears to be increased adipose tissue that is equal to her right.  Provided reassurance and went over U/S results.  No further evaluation at this time.

## 2020-12-26 NOTE — Progress Notes (Signed)
SUBJECTIVE:   CHIEF COMPLAINT / HPI: Leg pain and weakness   [name] is a 55 year old female presenting to discuss the following:  Bilateral leg pain/weakness: Started around June/July 2021, worse since onset. No proceeding injury or trauma. Posterior thigh into lower legs bilaterally. Sharp in sensation, will last 10-15 minutes. Comes on when she walks or turns over in bed. Present on both sides, but R>L. She has tried not anything to make it better with exception of ice that doesn't help. She feels weakness associated with the legs but no falls or legs giving out sensation. Occasionally tingling/numbness in her posterior right thigh in one localized area. No associated back pain. She feels limited in doing all activities due to this pain.   Weight loss: She also reports that she does want to lose weight as to help with her joint pains/overall health.  She previously was told that she can have her knee replaced due to her BMI.  She was previously referred to a dietitian and bariatric surgery, however she never heard from anyone.  Knot behind left leg: She notes swelling of her posterior left calf that is been present for the past several years.  She is very worried about it and would like an ultrasound to make her feel better.  She states she was previously evaluated for this in 2020 she believes, however cannot remember if she ended up getting imaging at that time or not.  Sometimes it is painful.  Denies any acute ankle swelling, difficulty breathing, skin redness, or puslike drainage from the area.  Fingernail abnormality: Reports noticing a darker line down her fingernail that started about 1 year ago.  Most prominent on her right thumb however has been on other nails as well.  Not painful.  PERTINENT  PMH / PSH: Osteoarthritis, cervical disc disease, nonobstructive CAD via coronary CTA, hypertension, tobacco use  OBJECTIVE:   BP 105/70   Pulse (!) 101   Ht 5\' 4"  (1.626 m)   Wt  293 lb 9.6 oz (133.2 kg)   LMP 02/21/2015   SpO2 99%   BMI 50.40 kg/m   General: Alert, NAD HEENT: NCAT, MMM Lungs: No increased WOB  Msk: normal ROM through lumbar/thoracic spine.  Nontender to paralumbar musculature or lumbar spinous processes. Nails: see pictures below.  Ext: Warm, dry, 2+ distal pulses, no edema, calves are equal in size bilaterally without any palpation of fluctuance or masslike area in the left posterior calf. Bilateral Knee: - Inspection: no gross deformity. No swelling/effusion (difficult to palpate due to habitus), erythema or bruising. Skin intact.  - Palpation: TTP to medial joint line bilaterally.  - ROM: full active ROM with flexion and extension in knee and hip - Neuro/vasc: NV intact - Special Tests: - LIGAMENTS: negative anterior and posterior drawer, no MCL or LCL laxity  -- PF JOINT: nml patellar mobility bilaterally. Positive patellar grind on the left.  Strength: 5/5 bilateral lower extremity strength through hip/knee/ankle joints.  Sensation to light touch intact throughout. --Gait: No assistive device use.  Does have a small limp and carries forward the right leg.        ASSESSMENT/PLAN:   Melanonychia Chronic, most prominent on right thumbnail, however present on several other nails as well.  Given bilateral distribution and appearance, less concern for underlying melanoma.  Could consider in the setting of fungal infection with thickening of her nails as well, however overall less consistent with this.  May be multifactorial in the setting of  tobacco use, possible trauma, genetics/racial variation.  Provided reassurance and will observe.  Follow-up if worsening, could consider nail biopsy, B12 level evaluation in the future.   Bilateral leg pain Chronic, worsening.  Fortunately with overall unremarkable exam and preserved strength today.  Do question if her leg pains are referred from her severe knee osteoarthritis +/- underlying OA within  her hips/lumbar spine. Her BMI is likely contributing as well. Given bilateral nature, less suspicious for radicular etiology.  Additionally not consistent with intermittent claudication given distribution and immediate onset with first steps.  Recommended ice/heat, elevation, Voltaren gel, and ROM exercises.  She additionally endorsed wanting to follow-up with Dr. Micheline Chapman, encouraged following up at sports medicine clinic for further evaluation.  Subcutaneous mass of both lower extremities She previously was evaluated for a possible localized swelling in her left lower leg in 01/2019.  Had a normal U/S at that time.  Additionally today did not note any localized swelling or fluctuance, appears to be increased adipose tissue that is equal to her right.  Provided reassurance and went over U/S results.  No further evaluation at this time.  Morbid obesity BMI 50.  Check A1c today.  Additionally this is precluding her from having a left knee replacement which has been debilitating to her as well.  Provided information for [facility], instructed to assess out-of-pocket cost or insurance approval.    Discussed with Dr. Andria Frames. Follow-up of the above concerns are not improving/worsening, otherwise follow-up in the next 3 months.  Kendra [name], [place]

## 2020-12-26 NOTE — Patient Instructions (Addendum)
[facility]:   [address], [place], [place] [address] Phone: [phone]  Tylenol up to 4000mg  daily, 1000mg  at a time at most. Keep icing and heat in the areas.   Call Dr. Micheline Chapman to schedule a follow up appointment.   Keep an eye on your fingernails, if getting worse let me know.

## 2020-12-26 NOTE — Assessment & Plan Note (Signed)
BMI 50.  Check A1c today.  Additionally this is precluding her from having a left knee replacement which has been debilitating to her as well.  Provided information for [facility], instructed to assess out-of-pocket cost or insurance approval.

## 2020-12-26 NOTE — Assessment & Plan Note (Signed)
Chronic, worsening.  Fortunately with overall unremarkable exam and preserved strength today.  Do question if her leg pains are referred from her severe knee osteoarthritis +/- underlying OA within her hips/lumbar spine. Her BMI is likely contributing as well. Given bilateral nature, less suspicious for radicular etiology.  Additionally not consistent with intermittent claudication given distribution and immediate onset with first steps.  Recommended ice/heat, elevation, Voltaren gel, and ROM exercises.  She additionally endorsed wanting to follow-up with Dr. Micheline Chapman, encouraged following up at sports medicine clinic for further evaluation.

## 2020-12-28 ENCOUNTER — Encounter: Payer: Self-pay | Admitting: Family Medicine

## 2021-01-05 ENCOUNTER — Other Ambulatory Visit: Payer: Self-pay

## 2021-01-05 ENCOUNTER — Ambulatory Visit (INDEPENDENT_AMBULATORY_CARE_PROVIDER_SITE_OTHER): Payer: Medicare Other | Admitting: Sports Medicine

## 2021-01-05 VITALS — BP 141/99 | Ht 64.0 in | Wt 290.0 lb

## 2021-01-05 DIAGNOSIS — M1712 Unilateral primary osteoarthritis, left knee: Secondary | ICD-10-CM

## 2021-01-05 MED ORDER — METHYLPREDNISOLONE ACETATE 80 MG/ML IJ SUSP
80.0000 mg | Freq: Once | INTRAMUSCULAR | Status: AC
  Administered 2021-01-05: 80 mg via INTRAMUSCULAR

## 2021-01-05 NOTE — Progress Notes (Signed)
Patient ID: Kendra [name], female   DOB: [birth year], 55 y.o.   MRN: [id number]  Patient presents today requesting referral for orthopedic surgery evaluation for possible left total knee arthroplasty.  She has a history of advanced left knee DJD.  Cortisone injections have been minimally helpful.  Physical exam of the left knee shows range of motion from 0 to 100 degrees.  No obvious effusion.  Knee is stable to valgus and varus stressing.  She is tender to palpation diffusely along the medial aspect of the knee with pain but no popping with McMurray's.  Neurovascularly intact distally.   Impression: Left knee pain secondary to advanced DJD  She is at the point now that she would like to discuss her surgical options.  I have placed a referral to Dr. Mayer Camel at [facility].  [name] understands that her weight may preclude her to a total knee arthroplasty but I think it is reasonable to get a surgical consultation at this time.

## 2021-01-05 NOTE — Patient Instructions (Signed)
[facility] Dr Mayer Camel Tuesday May 17th @ 130p Arrival time is 1p [address] [place]  [phone]

## 2021-01-19 DIAGNOSIS — M1712 Unilateral primary osteoarthritis, left knee: Secondary | ICD-10-CM | POA: Diagnosis not present

## 2021-01-19 DIAGNOSIS — M1711 Unilateral primary osteoarthritis, right knee: Secondary | ICD-10-CM | POA: Diagnosis not present

## 2021-02-03 ENCOUNTER — Encounter: Payer: Self-pay | Admitting: Neurology

## 2021-02-03 ENCOUNTER — Other Ambulatory Visit: Payer: Self-pay

## 2021-02-03 ENCOUNTER — Ambulatory Visit (INDEPENDENT_AMBULATORY_CARE_PROVIDER_SITE_OTHER): Payer: Medicare Other | Admitting: Neurology

## 2021-02-03 VITALS — BP 147/90 | HR 92 | Ht 64.0 in | Wt 291.0 lb

## 2021-02-03 DIAGNOSIS — G8929 Other chronic pain: Secondary | ICD-10-CM | POA: Diagnosis not present

## 2021-02-03 DIAGNOSIS — F17210 Nicotine dependence, cigarettes, uncomplicated: Secondary | ICD-10-CM

## 2021-02-03 DIAGNOSIS — Z720 Tobacco use: Secondary | ICD-10-CM | POA: Diagnosis not present

## 2021-02-03 DIAGNOSIS — R519 Headache, unspecified: Secondary | ICD-10-CM

## 2021-02-03 DIAGNOSIS — H02401 Unspecified ptosis of right eyelid: Secondary | ICD-10-CM

## 2021-02-03 DIAGNOSIS — F17200 Nicotine dependence, unspecified, uncomplicated: Secondary | ICD-10-CM

## 2021-02-03 MED ORDER — NORTRIPTYLINE HCL 10 MG PO CAPS: ORAL_CAPSULE | ORAL | 5 refills | Status: DC

## 2021-02-03 NOTE — Patient Instructions (Signed)
MRI brain without contrast

## 2021-02-03 NOTE — Progress Notes (Signed)
[facility] Clinic Note - Initial Visit   Date: 02/03/21  BEXLEIGH THERIAULT MRN: [id number] DOB: May 18, 1966   Dear Dr. Tyrone Nine:  Thank you for your kind referral of MAICEE ULLMAN for consultation of headaches. Although her history is well known to you, please allow [place] to reiterate it for the purpose of our medical record. The patient was accompanied to the clinic by mother and grandson who also provides collateral information.     History of Present Illness: Kendra [name] is a 55 y.o. right-handed female with asthma, GERD, hypertension, CAD, and tobacco use presenting for evaluation of headaches.  She has headaches for the past 13 years, but they have recently intensified. She has achy/throbbing pain over the temples and forehead. Headaches occurs daily for two months, then go away for two months.  She has not been on preventative therapy.  She denies nausea/vomiting or photosensitivity.  She takes tylenol twice daily which helps.  No specific triggers or warning signs.   She is on disability for arthritis.   Out-side paper records, electronic medical record, and images have been reviewed where available and summarized as:  Lab Results  Component Value Date   HGBA1C 5.7 12/26/2020   Lab Results  Component Value Date   VITAMINB12 289 06/02/2018   Lab Results  Component Value Date   TSH 1.060 06/02/2018   No results found for: SWFUXNATFT, POCTSEDRATE  Past Medical History:  Diagnosis Date   Anemia    Asthma    Esophagitis    GERD (gastroesophageal reflux disease)    HTN (hypertension)    Hyperplastic colon polyp    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    Cor CTA 05/2019: Ca score 81.5 (96th percentile); mRCA < 25; dLM < 25; oLAD 25-49, pLAD < 25    Past Surgical History:  Procedure Laterality Date   ABDOMINAL HYSTERECTOMY  04/12/15   ANKLE SURGERY Left    CESAREAN SECTION     CYSTOSCOPY N/A 04/12/2015   Procedure: CYSTOSCOPY;   Surgeon: Lavonia Drafts, MD;  Location: [facility];  Service: Gynecology;  Laterality: N/A;   FINGER SURGERY     TIBIA FRACTURE SURGERY     TUBAL LIGATION       Medications:  Outpatient Encounter Medications as of 02/03/2021  Medication Sig   acetaminophen (TYLENOL 8 HOUR) 650 MG CR tablet Take 1 tablet (650 mg total) by mouth every 8 (eight) hours as needed for pain.   albuterol (PROAIR HFA) 108 (90 Base) MCG/ACT inhaler inhale 2 puffs every 4 hours if needed wheezing or shortness of breath   alum & mag hydroxide-simeth (MAALOX/MYLANTA) 200-200-20 MG/5ML suspension Take 30 mLs by mouth every 6 (six) hours as needed for indigestion or heartburn. Reported on 10/26/2015   aspirin EC 81 MG tablet Take 1 tablet (81 mg total) by mouth daily. Swallow whole.   buPROPion (WELLBUTRIN) 75 MG tablet TAKE 1 TABLET(75 MG) BY MOUTH TWICE DAILY   Evolocumab (REPATHA SURECLICK) 732 MG/ML SOAJ Inject 140 mg into the skin every 14 (fourteen) days.   ezetimibe (ZETIA) 10 MG tablet Take 1 tablet (10 mg total) by mouth daily.   furosemide (LASIX) 20 MG tablet TAKE 1 TABLET(20 MG) BY MOUTH DAILY   gabapentin (NEURONTIN) 300 MG capsule TAKE 1 CAPSULE(300 MG) BY MOUTH AT BEDTIME   ibuprofen (ADVIL) 600 MG tablet Take 1 tablet (600 mg total) by mouth every 6 (six) hours as needed.   icosapent Ethyl (VASCEPA) 1 g capsule  Take 2 capsules (2 g total) by mouth 2 (two) times daily.   pantoprazole (PROTONIX) 40 MG tablet TAKE 1 TABLET BY MOUTH TWICE DAILY BEFORE A MEAL   rosuvastatin (CRESTOR) 20 MG tablet Take 1 tablet (20 mg total) by mouth daily.   traMADol (ULTRAM) 50 MG tablet Take 1 tablet by mouth every 12 hours if needed.   No facility-administered encounter medications on file as of 02/03/2021.    Allergies:  Allergies  Allergen Reactions   Lisinopril Swelling    Family History: Family History  Problem Relation Age of Onset   Diabetes Mother    Heart disease Mother    Lung cancer Father 46    Diabetes Sister    Breast cancer Neg Hx    Stomach cancer Neg Hx    Colon cancer Neg Hx    Pancreatic cancer Neg Hx     Social History: Social History   Tobacco Use   Smoking status: Every Day    Packs/day: 1.00    Pack years: 0.00    Types: Cigarettes    Start date: 08/27/2002   Smokeless tobacco: Never   Tobacco comments:    cut back to 4-5 cigs per day from 1ppd  Vaping Use   Vaping Use: Never used  Substance Use Topics   Alcohol use: Yes    Alcohol/week: 0.0 standard drinks    Comment: 2 -40oz daily,  24oz daily.     Drug use: No   Social History   Social History Narrative   Health Care POA:    Emergency Contact: mother, Melany [place] (h) [phone]   End of Life Plan:    Who lives with you: self   Any pets: none   Diet: Pt reports not eating very much.  Drinks juice and alcohol most days.    Exercise: Pt has not regular exercise routine.   Seatbelts: Pt reports wearing seatbelt when in vehicles.    Sun Exposure/Protection:    Hobbies: watching TV   Lives with son in a one story home.  Has 3 children.  On disability for arthritis.  Education: high school.    She last worked in 2013 at [employer].     Right hand    Vital Signs:  BP (!) 147/90   Pulse 92   Ht 5\' 4"  (1.626 m)   Wt 291 lb (132 kg)   LMP 02/21/2015   SpO2 99%   BMI 49.95 kg/m     Neurological Exam: MENTAL STATUS including orientation to time, place, person, recent and remote memory, attention span and concentration, language, and fund of knowledge is normal.  Speech is not dysarthric.  CRANIAL NERVES: II:  No visual field defects.   III-IV-VI: Pupils equal round and reactive to light.  Normal conjugate, extra-ocular eye movements in all directions of gaze.  No nystagmus.  Moderate right ptosis(new).   V:  Normal facial sensation.    VII:  Normal facial symmetry and movements.   VIII:  Normal hearing and vestibular function.   IX-X:  Normal palatal movement.   XI:  Normal shoulder  shrug and head rotation.   XII:  Normal tongue strength and range of motion, no deviation or fasciculation.  MOTOR:  No atrophy, fasciculations or abnormal movements.  No pronator drift.   Upper Extremity:  Right  Left  Deltoid  5/5   5/5   Biceps  5/5   5/5   Triceps  5/5   5/5   Infraspinatus 5/5  5/5  Medial pectoralis 5/5  5/5  Wrist extensors  5/5   5/5   Wrist flexors  5/5   5/5   Finger extensors  5/5   5/5   Finger flexors  5/5   5/5   Dorsal interossei  5/5   5/5   Abductor pollicis  5/5   5/5   Tone (Ashworth scale)  0  0   Lower Extremity:  Right  Left  Hip flexors  5/5   5/5   Hip extensors  5/5   5/5   Adductor 5/5  5/5  Abductor 5/5  5/5  Knee flexors  5/5   5/5   Knee extensors  5/5   5/5   Dorsiflexors  5/5   5/5   Plantarflexors  5/5   5/5   Toe extensors  5/5   5/5   Toe flexors  5/5   5/5   Tone (Ashworth scale)  0  0   MSRs:  Right        Left                  brachioradialis 2+  2+  biceps 2+  2+  triceps 2+  2+  patellar 2+  2+  ankle jerk 2+  2+  Hoffman no  no  plantar response down  down   SENSORY:  Normal and symmetric perception of light touch, pinprick, vibration, and proprioception.     COORDINATION/GAIT: Normal finger-to- nose-finger.  Intact rapid alternating movements bilaterally.  Gait is wide-based, unassisted, due to body habitus.   IMPRESSION: Chronic daily headaches, worsening.  Exam shows right ptosis, which was not present on her prior exam in 2019.    - MRI brain wo contrast   - Start nortriptyline 10mg  at bedtime for 2 week, then increase to 2 tablet at bedtime  2.    Medication overuse headaches  - Limit OTC medications such as NSAIDs and tylenol to twice per week   3.  Tobacco cessation counseling.  - Currently smoking 1 packs/day   - Patient was informed of the dangers of tobacco abuse including stroke, cancer, and MI, as well as benefits of tobacco cessation. - Patient is willing to quit at this time. -  Approximately 4 mins were spent counseling patient cessation techniques. We discussed various methods to help quit smoking, including deciding on a date to quit, joining a support group, pharmacological agents- nicotine gum/patch/lozenges, chantix.  - I will reassess her progress at the next follow-up visit  Return to clinic in 4 months.    Thank you for allowing me to participate in patient's care.  If I can answer any additional questions, I would be pleased to do so.    Sincerely,    Sofiya Ezelle K. Posey Pronto, DO

## 2021-02-15 ENCOUNTER — Other Ambulatory Visit: Payer: Self-pay

## 2021-02-15 ENCOUNTER — Ambulatory Visit
Admission: RE | Admit: 2021-02-15 | Discharge: 2021-02-15 | Disposition: A | Discharge status: Home / Self Care | Payer: Medicare Other | Source: Ambulatory Visit | Attending: Neurology | Admitting: Neurology

## 2021-02-15 DIAGNOSIS — H02401 Unspecified ptosis of right eyelid: Secondary | ICD-10-CM

## 2021-02-15 DIAGNOSIS — R519 Headache, unspecified: Secondary | ICD-10-CM

## 2021-02-15 DIAGNOSIS — G43709 Chronic migraine without aura, not intractable, without status migrainosus: Secondary | ICD-10-CM | POA: Diagnosis not present

## 2021-02-16 NOTE — Progress Notes (Signed)
LMOVM for pt to call the office back in regards to her MRI results.

## 2021-02-20 ENCOUNTER — Telehealth: Payer: Self-pay | Admitting: Neurology

## 2021-02-20 NOTE — Telephone Encounter (Signed)
Pt would like a call back regarding results. Can call [phone] or [phone]

## 2021-02-21 NOTE — Telephone Encounter (Signed)
Returned call to patient. MRI brain results discussed which shows age-related changes. Nothing worrisome to explain her headaches. She is taking nortriptlyine 20mg  at bedtime.  She will call with update in 1 month, if headaches are unchanged, nortriptyline can be increased to 30mg  at bedtime.

## 2021-03-07 ENCOUNTER — Other Ambulatory Visit: Payer: Self-pay | Admitting: Family Medicine

## 2021-03-07 DIAGNOSIS — M7989 Other specified soft tissue disorders: Secondary | ICD-10-CM

## 2021-03-10 ENCOUNTER — Other Ambulatory Visit: Payer: Self-pay | Admitting: Family Medicine

## 2021-03-10 DIAGNOSIS — K219 Gastro-esophageal reflux disease without esophagitis: Secondary | ICD-10-CM

## 2021-05-08 ENCOUNTER — Other Ambulatory Visit: Payer: Self-pay | Admitting: Cardiovascular Disease

## 2021-06-01 ENCOUNTER — Ambulatory Visit (INDEPENDENT_AMBULATORY_CARE_PROVIDER_SITE_OTHER): Payer: Medicare Other | Admitting: Family Medicine

## 2021-06-01 ENCOUNTER — Other Ambulatory Visit: Payer: Self-pay

## 2021-06-01 ENCOUNTER — Encounter: Payer: Self-pay | Admitting: Family Medicine

## 2021-06-01 VITALS — BP 115/78 | HR 99 | Ht 64.0 in | Wt 298.2 lb

## 2021-06-01 DIAGNOSIS — G8929 Other chronic pain: Secondary | ICD-10-CM

## 2021-06-01 DIAGNOSIS — Z23 Encounter for immunization: Secondary | ICD-10-CM | POA: Diagnosis not present

## 2021-06-01 DIAGNOSIS — M545 Low back pain, unspecified: Secondary | ICD-10-CM

## 2021-06-01 MED ORDER — GABAPENTIN 300 MG PO CAPS: ORAL_CAPSULE | ORAL | 1 refills | Status: DC

## 2021-06-01 MED ORDER — MELOXICAM 15 MG PO TABS: 15.0000 mg | ORAL_TABLET | Freq: Every day | ORAL | 0 refills | Status: DC

## 2021-06-01 NOTE — Progress Notes (Signed)
SUBJECTIVE:   CHIEF COMPLAINT / HPI:   Kendra [name] presents today with pain on her right side. She has a past history of low back pain. She came into clinic for similar pain a few months ago and was given gabapentin which she says helped.   Low back pain: It started 3 to 4 days ago when she got out of bed. The pain is so bad she often has to hunch over. She finds it worsens when standing up and walking and finds relief when hunched over or in bed. She endorses 8/10 pain that is stabbing in quality. The stabbing pain is localized but endorses additional pain/discomfort that courses down her leg laterally to right below her knee. She is able to urinate and defecate without trouble, but the motion of going to wipe herself reproduces the pain.   Weakness in back of legs: Endorses persistent weak feeling in her legs. She has mentioned it to various clinics including this one but no one has been able to address it.   Medications: She needs refills on all her medications today. Reviewed with her that it looks like there are refills ordered on medications. She is aware and says the pharmacy keeps telling her she needs to talk to them   PERTINENT  PMH / PSH:  Osteoarthritis of knee  OBJECTIVE:   BP 115/78   Pulse 99   Ht 5\' 4"  (1.626 m)   Wt 298 lb 4 oz (135.3 kg)   LMP 02/21/2015   SpO2 100%   BMI 51.19 kg/m   GEN: Well appearing, in mild distress, pleasant and able to participate in exam PULM: CTAB, NLWB, no wheezing or ronchi. CARD: Regular rate and rhythm, Normal S1/S2, No murmurs, rubs or gallops HEENT: NCAT. White sclera without injection. Pupils equally round and reactive bilaterally. MSK: Lumbar spine evaluation  - Straight leg test negative. Full ROM bilaterally. 5/5 strength bilaterally   - No gross deformity on inspection.  - Tender to palpation over spinal process. No paraspinal pain.  - Positive Trendelenburg bilaterally  - Sensation intact bilaterally across L4-S1. Reflexes  intact.  - Midline back pain on flexion and extension of back.  ASSESSMENT/PLAN:   Chronic low back pain Suspect multiple causes of back pain including facet articulopathy, deconditioned gluteus medius muscles and muscle spasms. Pain on flexion and extension of back consistent with facet articulopathy and confirmed with MR imaging from 02/10/2014 showing annular bulge. Prescribing meloxicam 15 mg daily and gabapentin 300 mg to take at night given normal kidney function. Will consider repeat imaging to assess disease progression if symptoms do not improve. For muscle spasms, advised heating pads, OTC Voltaren gel, lidocaine patch and continued normal activity for relief.  Sent referral to PT support conditioning of gluteus medius muscles and overall strengthening given extent of back pain and deconditioning. Feel strongly patient will benefit from physical therapy especially with worsening OA.  Morbid obesity ([place]) Related to complaint today, discussed impact of weight on her joints and legs and related deconditioning. Patient is not able to engage in physical activity due to her OA and is unsure of how to proceed. She is on a waitlist for the weight clinic on [address] but has not heard from them. She was told by a clinic (unclear which) that in a few months she will likely be bedridden due to the state of her knees. This troubles her greatly. Provided education on nutritional choices she can try to implement to help reduce weight. Discussed  possibility of engaging with PT to build strength.     [place]

## 2021-06-01 NOTE — Assessment & Plan Note (Addendum)
Related to complaint today, discussed impact of weight on her joints and legs and related deconditioning. Patient is not able to engage in physical activity due to her OA and is unsure of how to proceed. She is on a waitlist for the weight clinic on [address] but has not heard from them. She was told by a clinic (unclear which) that in a few months she will likely be bedridden due to the state of her knees. This troubles her greatly. Provided education on nutritional choices she can try to implement to help reduce weight. Discussed possibility of engaging with PT to build strength.

## 2021-06-01 NOTE — Patient Instructions (Addendum)
It was a pleasure to see you today!  For your back pain: try heat, try topical creams or gels like icy hot, voltaren gel, salonpas, or lidocaine patches (not at the same time), keep trying to be as active as normal because it can get worse with inactivity. I have placed a referral for physical therapy. You should receive a phone call in 1-2 weeks to schedule this appointment. If for any reason you do not receive a phone call or need more help scheduling this appointment, please call our office at [phone]. Try meloxicam 15 mg once a day for pain. If you have stomach pain or blood in stool, stop taking and call our office at [phone] Try gabapentin 300 mg at night for pain Follow up in 1 month. Follow up sooner if symptoms are worsening, you have loss of sensation, trouble walking, etc.  Be Well,  Dr. Chauncey Reading

## 2021-06-01 NOTE — Progress Notes (Deleted)
    SUBJECTIVE:   CHIEF COMPLAINT / HPI: low back pain  Low back pain: Patient has history of low back pain.  HC maintenance: counseled on Covid, flu, and shingles vaccines.  PERTINENT  PMH / PSH: low back pain  OBJECTIVE:   LMP 02/21/2015   Nursing note and vitals reviewed GEN: *** resting comfortably in chair, NAD, WNWD HEENT: NCAT. PERRLA. Sclera without injection or icterus. MMM.  Neck: Supple.  Cardiac: Regular rate and rhythm. Normal S1/S2. No murmurs, rubs, or gallops appreciated. 2+ radial pulses. Lungs: Clear bilaterally to ascultation. No increased WOB, no accessory muscle usage. No w/r/r. Lumbar spine: - Inspection: no gross deformity or asymmetry, swelling or ecchymosis. No skin changes - Palpation: No TTP over the spinous processes, paraspinal muscles, or SI joints b/l - ROM: full active ROM of the lumbar spine in flexion and extension without pain - Strength: 5/5 strength of lower extremity in L4-S1 nerve root distributions b/l - Neuro: sensation intact in the L4-S1 nerve root distribution b/l, 2+ L4 and S1 reflexes - Straight Leg Raise test: NEG*** Abdomen: Normoactive bowel sounds. No tenderness to deep or light palpation. No rebound or guarding.  ***  Neuro: AOx3 *** Ext: no edema Psych: Pleasant and appropriate    ASSESSMENT/PLAN:   No problem-specific Assessment & Plan notes found for this encounter.     Gladys Damme, MD [place]

## 2021-06-01 NOTE — Assessment & Plan Note (Addendum)
Suspect multiple causes of back pain including facet articulopathy, deconditioned gluteus medius muscles and muscle spasms. Pain on flexion and extension of back consistent with facet articulopathy and confirmed with MR imaging from 02/10/2014 showing annular bulge. Prescribing meloxicam 15 mg daily and gabapentin 300 mg to take at night given normal kidney function. Will consider repeat imaging to assess disease progression if symptoms do not improve. For muscle spasms, advised heating pads, OTC Voltaren gel, lidocaine patch and continued normal activity for relief.  Sent referral to PT support conditioning of gluteus medius muscles and overall strengthening given extent of back pain and deconditioning. Feel strongly patient will benefit from physical therapy especially with worsening OA.

## 2021-06-08 ENCOUNTER — Other Ambulatory Visit: Payer: Self-pay | Admitting: Cardiovascular Disease

## 2021-06-08 ENCOUNTER — Telehealth: Payer: Self-pay

## 2021-06-08 DIAGNOSIS — Z9189 Other specified personal risk factors, not elsewhere classified: Secondary | ICD-10-CM

## 2021-06-08 DIAGNOSIS — I2510 Atherosclerotic heart disease of native coronary artery without angina pectoris: Secondary | ICD-10-CM

## 2021-06-08 MED ORDER — ROSUVASTATIN CALCIUM 20 MG PO TABS: 20.0000 mg | ORAL_TABLET | Freq: Every day | ORAL | 1 refills | Status: DC

## 2021-06-08 NOTE — Progress Notes (Signed)
[name])                                            [place] Team                                        Statin Quality Measure Assessment    06/08/2021  Kendra [name] Dec 13, 1965 [id number]  Per review of chart and payor information, this patient has been flagged for non-adherence to the following CMS Quality Measure:   [x]  Statin Use in Persons with Diabetes  []  Statin Use in Persons with Cardiovascular Disease  The ASCVD Risk score (Arnett DK, et al., 2019) failed to calculate for the following reasons:   The valid total cholesterol range is 130 to 320 mg/dL   Currently prescribed statin:  [x]  Yes []  No     Comments: Rosuvastatin 20 mg daily - Per pharmacy claims, patient has not picked up rosuvastatin prescription in 2022. I called and spoke w/ the patient who reported she has not filled rosuvastatin "in a hot minute" because she does not know the name of medication and lost the prescription number- even though said she was on auto-fill. She reports not knowing the indication of use for most of her medications. - she does know what gabapentin and tramadol are used for. Additionally, she has not filled several other medications for the reasons mentioned above.   I called [place], with the patient on the phone, and confirmed she is enrolled on auto-fill. I checked the EMR and confirmed the following medications, with the patient, needed refills:  Vascepa- unable to refill as the prescription expired Rosuvastatin - unable to refill as the prescription expired ([facility] does not have a prescription for 20 mg from the one prescribed in April 2022) Zetia- unable to refill as the prescription expired Nortriptyline  Pantoprazole  Lasix Aspirin - patient called me back to request that I get this medication refilled because "my chest hurts and I need that to help with my heart". As I tried to call [facility] back  with the patient on the phone, the call disconnected and I was not able to reach the patient when I called back twice.  She said that she asked about a special type of aspirin for chest pain at her last O/v or in July.  I do not see a prescription of Nitroglycerin in the outpatient setting.  She reports endorsing CP almost daily that could be related to smoking. She stated that the cardiologist is aware of the CP and advised her to go to the hospital/ER if it gets worse. She denied sxs of CP, tight chest, nausea, shoulder pain,etc while on the phone with me. Additionally, she did not sound SOB or in any distress.  Bupropion - pt declined filling medication for smoking cessation as she reports its   Please consider ONE of the following recommendations highlighted in blue:  If clinically appropriate, please consider medication review for compliance concerns, prescription renewal, and assess need for CP medication.    Thank you for your time,  Kristeen Miss, [place] Cell: [phone]

## 2021-06-09 ENCOUNTER — Other Ambulatory Visit: Payer: Self-pay | Admitting: Physician Assistant

## 2021-06-09 DIAGNOSIS — I2510 Atherosclerotic heart disease of native coronary artery without angina pectoris: Secondary | ICD-10-CM

## 2021-06-30 ENCOUNTER — Ambulatory Visit: Payer: Medicare Other | Admitting: Student

## 2021-07-17 ENCOUNTER — Ambulatory Visit (INDEPENDENT_AMBULATORY_CARE_PROVIDER_SITE_OTHER): Payer: Medicare Other

## 2021-07-17 ENCOUNTER — Other Ambulatory Visit: Payer: Self-pay

## 2021-07-17 ENCOUNTER — Encounter: Payer: Self-pay | Admitting: Student

## 2021-07-17 ENCOUNTER — Ambulatory Visit (INDEPENDENT_AMBULATORY_CARE_PROVIDER_SITE_OTHER): Payer: Medicare Other | Admitting: Student

## 2021-07-17 VITALS — BP 149/64 | HR 89 | Ht 64.0 in | Wt 303.0 lb

## 2021-07-17 DIAGNOSIS — E782 Mixed hyperlipidemia: Secondary | ICD-10-CM

## 2021-07-17 DIAGNOSIS — F17200 Nicotine dependence, unspecified, uncomplicated: Secondary | ICD-10-CM | POA: Diagnosis not present

## 2021-07-17 DIAGNOSIS — M5440 Lumbago with sciatica, unspecified side: Secondary | ICD-10-CM | POA: Diagnosis not present

## 2021-07-17 DIAGNOSIS — Z23 Encounter for immunization: Secondary | ICD-10-CM | POA: Diagnosis not present

## 2021-07-17 DIAGNOSIS — M545 Low back pain, unspecified: Secondary | ICD-10-CM | POA: Diagnosis not present

## 2021-07-17 DIAGNOSIS — F5101 Primary insomnia: Secondary | ICD-10-CM | POA: Diagnosis not present

## 2021-07-17 DIAGNOSIS — G8929 Other chronic pain: Secondary | ICD-10-CM

## 2021-07-17 MED ORDER — NORTRIPTYLINE HCL 10 MG PO CAPS: ORAL_CAPSULE | ORAL | 5 refills | Status: DC

## 2021-07-17 MED ORDER — GABAPENTIN 300 MG PO CAPS
ORAL_CAPSULE | ORAL | 1 refills | Status: DC
Start: 2021-07-17 — End: 2023-01-04

## 2021-07-17 NOTE — Progress Notes (Signed)
    SUBJECTIVE:   CHIEF COMPLAINT / HPI:   Lumbar Back Pain with Sciatica: Was seen by Dr. Chauncey Reading six weeks ago. Found to have limited flexion/extension of lumbar spine with associated sciatica, and and was given PT referral, meloxicam and gabapentin 300mg  nightly. Patient has not yet heard back from physical therapy. Pain has been somewhat improved with gabapentin but is still bothersome to her and interferes with her ability to walk and play with her grandchildren as she would like. 2015 Lumbar MRI showed severe facet arthropathy and annular bulging disc without compression. Denies fever, urinary/fecal incontinence, or saddle anesthesia. Patient acknowledges that weight loss will be a primary factor in addressing her back pain. Weight is up 5lbs today from last visit 2 months ago. Patient is establishing with [facility] next Monday and is excited about this.   Tobacco Abuse: 20 pack year hx. Currently smoking 1 ppd. Patient expresses desire to quit. Willing to follow-up with Dr. Valentina Lucks for dedicated visit to discuss tobacco cessation.   PERTINENT  PMH / PSH: Morbid obesity, osteoarthritis of knee  OBJECTIVE:   BP (!) 149/64   Pulse 89   Ht 5\' 4"  (1.626 m)   Wt (!) 303 lb (137.4 kg)   LMP 02/21/2015   SpO2 100%   BMI 52.01 kg/m , s/p hysterectomy    Physical Exam Vitals reviewed.  Constitutional:      General: She is not in acute distress.    Appearance: She is obese.  Cardiovascular:     Rate and Rhythm: Normal rate and regular rhythm.     Heart sounds: Normal heart sounds.  Pulmonary:     Effort: Pulmonary effort is normal.     Breath sounds: Normal breath sounds.  Psychiatric:        Mood and Affect: Mood normal.        Behavior: Behavior normal.     ASSESSMENT/PLAN:   Morbid obesity (HCC) Weight up 5lbs in past two months. Establishing with [facility]. First visit one week from today.   Chronic low back pain Had a moderate  response to gabapentin and Mobic. Has not had a chance to start PT yet and is not interested in pursuing surgical options at this time. No indication for further imaging at this time.  -Will continue mobic at 15mg /day  -Increase Gabapentin to 300mg   BID, decrease nortriptyline from 20mg  nightly to 10mg  nightly to avoid over-sedation -Re-placed referral to physical therapy, specifically neuro-PT  TOBACCO ABUSE Patient interested in quitting.  -Scheduled with Dr. Valentina Lucks one week from tomorrow for tobacco cessation     Pearla Dubonnet, MD [place]

## 2021-07-17 NOTE — Assessment & Plan Note (Signed)
Weight up 5lbs in past two months. Establishing with [facility]. First visit one week from today.

## 2021-07-17 NOTE — Patient Instructions (Signed)
Kendra [name], It is such a joy to take care you! Thank you for coming in today.   As a reminder, here is a recap of what we talked about today:  -I am so glad that you are getting in with [facility]--I think this will be most helpful in addressing many of your health challenges, especially your back pain! -I am re-sending the referral to physical therapy. This will be the cornerstone of your treatment. If you have not heard back from them in 2 weeks, please call our office to follow-up on this. -You can start taking your gabapentin once during the day and again at night. Please reduce your evening nortriptyline to 1 tablet instead of 2. -Continue to avoid taking ibuprofen as long as you are also on meloxicam (Mobic) -Dr. Valentina Lucks will see you on Tuesday, Nov 29th at 9:45 for an appointment to talk about your tobacco use. He is a good, good man and you should find this very helpful.   We are checking some labs today. I will call you if they are abnormal. I will send you a MyChart message or a letter if they are normal.  If you do not hear about your labs in the next 2 weeks please let us know.  I recommend that you always bring your medications to each appointment as this makes it easy to ensure we are on the correct medications and helps [place] not miss when refills are needed.  Take care and seek immediate care sooner if you develop any concerns.   Kendra Guarneri, MD [place]

## 2021-07-17 NOTE — Assessment & Plan Note (Signed)
Had a moderate response to gabapentin and Mobic. Has not had a chance to start PT yet and is not interested in pursuing surgical options at this time. No indication for further imaging at this time.  -Will continue mobic at 15mg /day  -Increase Gabapentin to 300mg   BID, decrease nortriptyline from 20mg  nightly to 10mg  nightly to avoid over-sedation -Re-placed referral to physical therapy, specifically neuro-PT

## 2021-07-17 NOTE — Assessment & Plan Note (Signed)
Patient interested in quitting.  -Scheduled with Dr. Valentina Lucks one week from tomorrow for tobacco cessation

## 2021-07-18 LAB — LIPID PANEL
Chol/HDL Ratio: 1.8 ratio (ref 0.0–4.4)
Cholesterol, Total: 104 mg/dL (ref 100–199)
HDL: 58 mg/dL (ref >39)
LDL Chol Calc (NIH): 29 mg/dL (ref 0–99)
Triglycerides: 90 mg/dL (ref 0–149)
VLDL Cholesterol Cal: 17 mg/dL (ref 5–40)

## 2021-07-25 ENCOUNTER — Ambulatory Visit: Payer: Medicare Other | Admitting: Pharmacist

## 2021-07-26 ENCOUNTER — Ambulatory Visit (INDEPENDENT_AMBULATORY_CARE_PROVIDER_SITE_OTHER): Payer: Medicare Other | Admitting: Family Medicine

## 2021-07-26 DIAGNOSIS — R5383 Other fatigue: Secondary | ICD-10-CM

## 2021-07-26 DIAGNOSIS — R0602 Shortness of breath: Secondary | ICD-10-CM

## 2021-07-26 DIAGNOSIS — Z1331 Encounter for screening for depression: Secondary | ICD-10-CM

## 2021-07-27 ENCOUNTER — Ambulatory Visit: Payer: Medicare Other | Admitting: Pharmacist

## 2021-08-01 ENCOUNTER — Telehealth: Payer: Self-pay | Admitting: Pharmacist

## 2021-08-01 NOTE — Telephone Encounter (Signed)
Patient contacted for follow/up of missed appoint for tobacco cessation attempt - cancelled last week.   Patient willing to reschedule while on the phone.  Rescheduled for 08/03/2021 at 10:30 AM  Total time with patient call and documentation of interaction: 9 minutes.

## 2021-08-03 ENCOUNTER — Other Ambulatory Visit: Payer: Self-pay

## 2021-08-03 ENCOUNTER — Encounter: Payer: Self-pay | Admitting: Pharmacist

## 2021-08-03 ENCOUNTER — Ambulatory Visit (INDEPENDENT_AMBULATORY_CARE_PROVIDER_SITE_OTHER): Payer: Medicare Other | Admitting: Pharmacist

## 2021-08-03 DIAGNOSIS — F17200 Nicotine dependence, unspecified, uncomplicated: Secondary | ICD-10-CM

## 2021-08-03 MED ORDER — BUPROPION HCL ER (XL) 150 MG PO TB24: 150.0000 mg | ORAL_TABLET | Freq: Every day | ORAL | 6 refills | Status: DC

## 2021-08-03 MED ORDER — NORTRIPTYLINE HCL 25 MG PO CAPS: 25.0000 mg | ORAL_CAPSULE | Freq: Every day | ORAL | 5 refills | Status: DC

## 2021-08-03 MED ORDER — VARENICLINE TARTRATE 1 MG PO TABS: 1.0000 mg | ORAL_TABLET | Freq: Two times a day (BID) | ORAL | 3 refills | Status: DC

## 2021-08-03 MED ORDER — VARENICLINE TARTRATE 0.5 MG PO TABS: 0.5000 mg | ORAL_TABLET | Freq: Two times a day (BID) | ORAL | 1 refills | Status: DC

## 2021-08-03 NOTE — Patient Instructions (Addendum)
Nice to meet you today.   Plan is to decrease smoking to 6 cigarettes per day or less by January 5th 2023.   Take Nortriptyline 25mg  at bedtime.  Take Bupropion 150mg  XL once daily in the morning.   Start Varenicline 0.5mg  once daily with food for 1 week, then increase to 0.5mg  twice daily. In two weeks start varenicline 1mg  twice daily - with food.   Start taking Acetaminophen (tylenol) 650mg  tablets 2 or 3 times daily.  Take these about 8 hours apart.  Morning and bedtime is also an option.  Add the mid-day dose if it helps.   See you in January!

## 2021-08-03 NOTE — Progress Notes (Signed)
Reviewed: I agree with the documentation and management of Dr. Koval. 

## 2021-08-03 NOTE — Progress Notes (Signed)
   S:  Patient arrives approximately 20 minutes after scheduled appointment time. She was checked-in and was seen.  She ambulates without assistance. Patient arrives for evaluation/assistance with tobacco dependence.   Patient was referred and last seen by Primary Care Provider on 07/17/2021.   Age when started using tobacco on a daily basis 55 years of age. Brand smoked [place]. Number of cigarettes/day 20. Estimated nicotine content per cigarette (mg) > 1 mg.  Estimated nicotine intake per day > 20mg mg.   Smokes intermittently at night.  Smokes only 1 time per night when she smokes.  She admits that she does not sleep well.     Fagerstrom Score Question Scoring Patient Score  How soon after waking do you smoke your first cigarette? <5 mins (3) 5-30 mins (2) 31-60 mins (1) >60 mins (0) 3  Which cigarette would you most hate to give up? First one in AM (1)  Any other one (0) 1     How many cigarettes do you smoke/day? 21-30 (2) >30 (3) 20 cigs per day (2)   Total Score > 6  Score interpretation: low 1-2, low-to-moderate 3-4, moderate 5-7, high >7  Most recent quit attempt - never Longest time ever been tobacco free never for a single day.  Medications used in past cessation efforts include: NONE  However, patient has taken bupropion 75mg  BID for several years She also takes nortriptyline 10mg  QHS  Rates IMPORTANCE of quitting tobacco on 1-10 scale of 10. Rates CONFIDENCE of quitting tobacco on 1-10 scale of 6.  Most common triggers to use tobacco include; anger/stress, habit   Motivation to quit: Health "I need to quit"  Clinical ASCVD: Yes   A/P: Tobacco use disorder with severe nicotine dependence of approximately 20 years duration in a patient who is good candidate for success because of current level of commitment/motivation.     -Adjusted dose of bupropion from 75mg  BID with second dose in the evening to 150mg  once daily in the AM using the XL formulation. Patient  counseled on purpose, proper use, and potential adverse effects, including insomnia. -Increased dose of nortriptyline from 10 to 25mg  QHS.  -Initiated varenicline 0.5 mg by mouth once daily with food x7 days, then 0.5 mg by mouth twice daily with food x7 days, then 1 mg by mouth twice daily with food thereafter. Patient counseled on purpose, proper use, and potential adverse effects, including GI upset..  Reassess progress at face-to-face visit January 5th.   Chronic pain - uncontrolled on current regimen. - instructed to increase acetaminophen from once daily to 2-3 times  daily using the acetaminophen 650mg  SR formulation.   Written information provided. F/U Rx Clinic Visit  08/31/2021.  Total time in face-to-face counseling 38 minutes.

## 2021-08-03 NOTE — Assessment & Plan Note (Signed)
Tobacco use disorder with severe nicotine dependence of approximately 20 years duration in a patient who is good candidate for success because of current level of commitment/motivation.     -Adjusted dose of bupropion from 75mg  BID with second dose in the evening to 150mg  once daily in the AM using the XL formulation. Patient counseled on purpose, proper use, and potential adverse effects, including insomnia. -Increased dose of nortriptyline from 10 to 25mg  QHS.  -Initiated varenicline 0.5 mg by mouth once daily with food x7 days, then 0.5 mg by mouth twice daily with food x7 days, then 1 mg by mouth twice daily with food thereafter. Patient counseled on purpose, proper use, and potential adverse effects, including GI upset.Marland Kitchen

## 2021-08-09 ENCOUNTER — Ambulatory Visit (INDEPENDENT_AMBULATORY_CARE_PROVIDER_SITE_OTHER): Payer: Medicare Other | Admitting: Family Medicine

## 2021-08-12 NOTE — Therapy (Addendum)
OUTPATIENT PHYSICAL THERAPY THORACOLUMBAR EVALUATION   Patient Name: Kendra [name] MRN: [id number] DOB:Apr 25, 1966, 55 y.o., female Today's Date: 08/16/2021   PT End of Session - 08/16/21 1205     Visit Number 1    Number of Visits 16    Date for PT Re-Evaluation 10/11/21    Authorization Type U[employer] MCR - FOTO    Progress Note Due on Visit 10    PT Start Time 1130    PT Stop Time 1200    PT Time Calculation (min) 30 min             Past Medical History:  Diagnosis Date   Anemia    Asthma    Esophagitis    GERD (gastroesophageal reflux disease)    HTN (hypertension)    Hyperplastic colon polyp    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    Cor CTA 05/2019: Ca score 81.5 (96th percentile); mRCA < 25; dLM < 25; oLAD 25-49, pLAD < 25   Past Surgical History:  Procedure Laterality Date   ABDOMINAL HYSTERECTOMY  04/12/15   ANKLE SURGERY Left    CESAREAN SECTION     CYSTOSCOPY N/A 04/12/2015   Procedure: CYSTOSCOPY;  Surgeon: Lavonia Drafts, MD;  Location: [facility];  Service: Gynecology;  Laterality: N/A;   FINGER SURGERY     TIBIA FRACTURE SURGERY     TUBAL LIGATION     Patient Active Problem List   Diagnosis Date Noted   Bilateral leg pain 12/26/2020   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    Headache 09/25/2018   Hyperlipidemia 06/18/2018   Swelling of lower extremity 02/15/2016   Hemangioma of liver 07/07/2015   Tendinopathy of right rotator cuff 08/24/2014   DOE (dyspnea on exertion) 06/01/2013   Cervical neck pain with evidence of disc disease 05/14/2012   Osteoarthrosis, unspecified whether generalized or localized, involving lower leg 05/14/2012   Chronic low back pain 12/20/2010   Morbid obesity ([place]) 04/07/2008   TOBACCO ABUSE 04/07/2008   Gastroesophageal reflux disease 10/24/2006    PCP: Eppie Gibson, MD  REFERRING PROVIDER: Lind Covert, *  REFERRING DIAG: Referral diagnosis: Chronic midline low back pain with  sciatica, sciatica laterality unspecified [M54.40, G89.29]  THERAPY DIAG:  Low back pain, unspecified back pain laterality, unspecified chronicity, unspecified whether sciatica present - Plan: PT plan of care cert/re-cert  Muscle weakness - Plan: PT plan of care cert/re-cert  Other abnormalities of gait and mobility - Plan: PT plan of care cert/re-cert  ONSET DATE: >19 years  SUBJECTIVE:  Kendra [name] is a 55 y.o. female who presents to clinic with chief complaint of R sided back pain.  MOI/History of condition:  Pt reports acute on chronic LBP which started in the early 90's after an epidural.  The pain has gotten worse over the last few weeks.  She reports she can no longer get any relief from the pain.  Denies falls.  She had a routine appt with her MD and the recommended PT.  She has not had any previous PT  From referring provider:  "Sanaia presents today with pain on her right side. She has a past history of low back pain. She came into clinic for similar pain a few months ago and was given gabapentin which she says helped.    Low back pain: It started 3 to 4 days ago when she got out of bed. The pain is so bad she often has to hunch over. She finds it worsens when standing up and walking and finds relief when hunched over or in bed. She endorses 8/10 pain that is stabbing in quality. The stabbing pain is localized but endorses additional pain/discomfort that courses down her leg laterally to right below her knee. She is able to urinate and defecate without trouble, but the motion of going to wipe herself reproduces the pain.    Weakness in back of legs: Endorses persistent weak feeling in her legs. She has mentioned it to various clinics including this one but no one has been able to address it."     Red flags:  denies bil numbness and tingling, BB changes, saddle anesthesia, and ataxia  Pertinent past history:  Bil knee pain  Pain:  Are you having pain? Yes Pain location: R sided back pain with radiation to bil knees NPRS scale:  highest 10/10 current 10/10  best 8/10 Aggravating factors: Walking, laying down Relieving factors: "nothing" tylenol Pain description: constant, sharp, burning, and dull Severity: high Irritability: moderate/high Stage: Chronic Stability: getting worse 24 hour pattern: no clear pattern   Occupation: NA  Hobbies/Recreation: NA  Assistive Device: NA  Hand Dominance: R  Patient Goals Reduce pain, walk (5 min), stand to able to cook (5 min), sit for longer   PRECAUTIONS: None  WEIGHT BEARING RESTRICTIONS No  FALLS:  Has patient fallen in last 6 months? No, Number of falls: 0  LIVING ENVIRONMENT: Lives with: lives with their family Stairs: No;   PLOF: Independent  DIAGNOSTIC FINDINGS:  None   OBJECTIVE:   GENERAL OBSERVATION:  L W/S in ambulation antalgic gait, large body habitus; L lateral shift; trunk and hip flexion in gait, anterior pelvic tilt  SENSATION:  Light touch: Appears intact  MUSCLE LENGTH: Hamstrings: Right >70 deg; Left >70 deg Thomas test: Right (+); Left (+) for hip flexor tightness   LUMBAR AROM  R iliac crest is elevated  AROM AROM  08/16/2021  Flexion WNL, w/ concordant pain  Extension WNL, w/ concordant pain  Right lateral flexion limited by 25%, w/ concordant pain  Left lateral flexion limited by 25%, w/ concordant pain  Right rotation limited by 25%, w/ concordant pain  Left rotation limited by 25%, w/ concordant pain   (Blank rows = not tested)   LE MMT:  MMT Right 08/16/2021 Left 08/16/2021  Hip flexion (L2, L3)    Knee extension (L3)    Knee flexion    Hip abduction    Hip extension    Hip external rotation    Hip internal rotation  Hip adduction    Ankle dorsiflexion  (L4)    Ankle plantarflexion (S1)    Ankle inversion    Ankle eversion    Great Toe ext (L5)      (Blank rows = not tested, score listed is out of 5 possible points.  N = WNL, D = diminished, * = concordant pain with testing)    LUMBAR SPECIAL TESTS:  Straight leg raise: L (+), R (+)  Palpation:   TTP throughout lumbar region including paraspinals, QL  SPINAL SEGMENTAL MOBILITY ASSESSMENT:  Unable to test d/t pain * pain with sacral and lumbar segmental testing  FUNCTIONAL TESTS:  30'' STS: 7  GAIT: 10 m max gait speed: 14'', .71 m/s, AD: N  PATIENT SURVEYS:  FOTO 50 ->68    TODAY'S TREATMENT  Lateral Shift Correction at [place]:  POC, diagnosis, prognosis, HEP, and outcome measures.  Pt educated via explanation, demonstration, and handout (HEP).  Pt confirms understanding verbally.    HOME EXERCISE PROGRAM: Access Code: [email or web address] URL: [email or web address] Date: 08/16/2021 Prepared by: Shearon Balo  Exercises Lateral Shift Correction at [place] - 8 x daily - 7 x weekly - 2 sets - 10 reps   ASSESSMENT:  CLINICAL IMPRESSION: Amiria is a 55 y.o. female who presents to clinic with signs and sxs consistent with LBP with dural tension.  She has a clear L lateral shift.  We will start with trying to correct this.  She also has general core and LE weakness.  Patient presents with pain and impairments/deficits in: LE strength, gait, lumbar ROM.  Activity limitations include: standing, bending, lifting, walking, standing.  Participation limitations include: cooking, walking for exercise, working.  Patient will benefit from skilled therapy to address pain and the listed deficits in order to achieve functional goals, enable safety and independence in completion of daily tasks, and return to PLOF.   REHAB POTENTIAL: Fair chronic condition with high pain  CLINICAL DECISION MAKING: Stable/uncomplicated  EVALUATION COMPLEXITY:  Low   GOALS:   SHORT TERM GOALS:  STG Name Target Date Goal status  1 Felipa will be >75% HEP compliant to improve carryover between sessions and facilitate independent management of condition  Baseline: No HEP 09/06/2021 INITIAL   LONG TERM GOALS:   LTG Name Target Date Goal status  1 Shalondra will improve FOTO score from 50 (baseline) to 68 as a proxy for functional improvement 10/11/2021 INITIAL  2 Ailine will improve 30'' STS (MCID 2) to >/= 10x (w/ UE?: N) to show improved LE strength and improved transfers   Baseline: 7x  w/ UE? Y 10/11/2021 INITIAL  3 Rosena will improve 10 meter max gait speed to .9 m/s (.1 m/s MCID) to show functional improvement in ambulation   Baseline: .71 m/s 10/11/2021 INITIAL  4 Harley will be able to stand for 30 min while making a meal, not limited by pain   Baseline: 5 min 10/11/2021 INITIAL  5 [place] will be able to walk for 30 min, not limited by pain   Baseline: 5 min 10/11/2021 INITIAL   PLAN: PT FREQUENCY: 1-2x/week  PT DURATION: 8 weeks (Ending 10/11/2021)  PLANNED INTERVENTIONS: Therapeutic exercises, Therapeutic activity, Neuro Muscular re-education, Gait training, Patient/Family education, Joint mobilization, Dry Needling, Electrical stimulation, Spinal mobilization and/or manipulation, Moist heat, Taping, Vasopneumatic device, Ionotophoresis 4mg /ml Dexamethasone, and Manual therapy  PLAN FOR NEXT SESSION: assess lateral shift directional preference effect, progressive core and hip strengthening, hip flexor stretching, PPT, dural glides   Shearon Balo  PT, DPT 08/16/2021, 12:16 PM

## 2021-08-16 ENCOUNTER — Ambulatory Visit: Payer: Medicare Other | Attending: Family Medicine | Admitting: Physical Therapy

## 2021-08-16 ENCOUNTER — Encounter: Payer: Self-pay | Admitting: Physical Therapy

## 2021-08-16 ENCOUNTER — Other Ambulatory Visit: Payer: Self-pay

## 2021-08-16 DIAGNOSIS — G8929 Other chronic pain: Secondary | ICD-10-CM | POA: Insufficient documentation

## 2021-08-16 DIAGNOSIS — M6281 Muscle weakness (generalized): Secondary | ICD-10-CM

## 2021-08-16 DIAGNOSIS — R2689 Other abnormalities of gait and mobility: Secondary | ICD-10-CM | POA: Diagnosis not present

## 2021-08-16 DIAGNOSIS — M5440 Lumbago with sciatica, unspecified side: Secondary | ICD-10-CM | POA: Insufficient documentation

## 2021-08-16 DIAGNOSIS — M545 Low back pain, unspecified: Secondary | ICD-10-CM | POA: Diagnosis not present

## 2021-08-29 NOTE — Therapy (Incomplete)
OUTPATIENT PHYSICAL THERAPY TREATMENT NOTE   Patient Name: Kendra [name] MRN: [id number] DOB:06-28-1966, 56 y.o., female Today's Date: 08/29/2021  PCP: Eppie Gibson, MD REFERRING PROVIDER: Lind Covert, *    Past Medical History:  Diagnosis Date   Anemia    Asthma    Esophagitis    GERD (gastroesophageal reflux disease)    HTN (hypertension)    Hyperplastic colon polyp    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    Cor CTA 05/2019: Ca score 81.5 (96th percentile); mRCA < 25; dLM < 25; oLAD 25-49, pLAD < 25   Past Surgical History:  Procedure Laterality Date   ABDOMINAL HYSTERECTOMY  04/12/15   ANKLE SURGERY Left    CESAREAN SECTION     CYSTOSCOPY N/A 04/12/2015   Procedure: CYSTOSCOPY;  Surgeon: Lavonia Drafts, MD;  Location: [facility];  Service: Gynecology;  Laterality: N/A;   FINGER SURGERY     TIBIA FRACTURE SURGERY     TUBAL LIGATION     Patient Active Problem List   Diagnosis Date Noted   Bilateral leg pain 12/26/2020   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    Headache 09/25/2018   Hyperlipidemia 06/18/2018   Swelling of lower extremity 02/15/2016   Hemangioma of liver 07/07/2015   Tendinopathy of right rotator cuff 08/24/2014   DOE (dyspnea on exertion) 06/01/2013   Cervical neck pain with evidence of disc disease 05/14/2012   Osteoarthrosis, unspecified whether generalized or localized, involving lower leg 05/14/2012   Chronic low back pain 12/20/2010   Morbid obesity ([place]) 04/07/2008   TOBACCO ABUSE 04/07/2008   Gastroesophageal reflux disease 10/24/2006    REFERRING DIAG: Referral diagnosis: Chronic midline low back pain with sciatica, sciatica laterality unspecified [M54.40, G89.29]  THERAPY DIAG:  No diagnosis found.  PERTINENT HISTORY: Bil knee pain  PRECAUTIONS/RESTRICTIONS:   PRECAUTIONS: None   WEIGHT BEARING RESTRICTIONS No  SUBJECTIVE:   ***  Pain:  Are you having pain? Yes Pain location: R sided back  pain with radiation to bil knees NPRS scale:  highest 10/10 current 10/10  best 8/10 Aggravating factors: Walking, laying down Relieving factors: "nothing" tylenol Pain description: constant, sharp, burning, and dull Severity: high Irritability: moderate/high Stage: Chronic Stability: getting worse 24 hour pattern: no clear pattern   OBJECTIVE:  LUMBAR AROM   R iliac crest is elevated   AROM AROM  08/16/2021  Flexion WNL, w/ concordant pain  Extension WNL, w/ concordant pain  Right lateral flexion limited by 25%, w/ concordant pain  Left lateral flexion limited by 25%, w/ concordant pain  Right rotation limited by 25%, w/ concordant pain  Left rotation limited by 25%, w/ concordant pain   (Blank rows = not tested)     LE MMT:   MMT Right 08/16/2021 Left 08/16/2021  Hip flexion (L2, L3)      Knee extension (L3)      Knee flexion      Hip abduction      Hip extension      Hip external rotation      Hip internal rotation      Hip adduction      Ankle dorsiflexion (L4)      Ankle plantarflexion (S1)      Ankle inversion      Ankle eversion      Great Toe ext (L5)         (Blank rows = not tested, score listed is out of 5 possible points.  N =  WNL, D = diminished, * = concordant pain with testing)   TREATMENT 08/29/2021:  Therapeutic Exercise: - ***  Manual Therapy: - ***  Neuromuscular re-ed: - ***  Therapeutic Activity: - ***  Self-care/Home Management: - ***  (Copy Objective to Plan section here)   [name] 08/29/2021, 1:12 PM

## 2021-08-30 ENCOUNTER — Ambulatory Visit: Payer: Medicare Other | Attending: Family Medicine | Admitting: Physical Therapy

## 2021-08-30 DIAGNOSIS — R2689 Other abnormalities of gait and mobility: Secondary | ICD-10-CM | POA: Insufficient documentation

## 2021-08-30 DIAGNOSIS — M545 Low back pain, unspecified: Secondary | ICD-10-CM | POA: Insufficient documentation

## 2021-08-30 DIAGNOSIS — M6281 Muscle weakness (generalized): Secondary | ICD-10-CM | POA: Insufficient documentation

## 2021-08-31 ENCOUNTER — Other Ambulatory Visit: Payer: Self-pay

## 2021-08-31 ENCOUNTER — Encounter: Payer: Self-pay | Admitting: Pharmacist

## 2021-08-31 ENCOUNTER — Ambulatory Visit (INDEPENDENT_AMBULATORY_CARE_PROVIDER_SITE_OTHER): Payer: Medicare Other | Admitting: Pharmacist

## 2021-08-31 DIAGNOSIS — F17200 Nicotine dependence, unspecified, uncomplicated: Secondary | ICD-10-CM | POA: Diagnosis not present

## 2021-08-31 MED ORDER — VARENICLINE TARTRATE 1 MG PO TABS: 1.0000 mg | ORAL_TABLET | Freq: Two times a day (BID) | ORAL | 3 refills | Status: DC

## 2021-08-31 NOTE — Assessment & Plan Note (Signed)
Tobacco use disorder with severe nicotine dependence of approximately 20 years duration in a patient who is good candidate for success because of current level of commitment/motivation. Diminished insomnia with recent dose changes of bupropion to XL and dose increase in nortriptyline from 10mg  to 25mg  QHS.    -Increase varenicline to 1 mg BID with food. Patient counseled on purpose, proper use, and potential adverse effects, including GI upset. New prescription provided.  -Continue bupropion XL 150 mg daily in the morning  -Continue nortriptyline 25 mg QHS -Counseled patient to try to avoid smoking cigarettes back to back but to try to space them out to help reduce the # of cigarettes per day. Also counseled her to find a hobby or something she enjoys doing to try to focus on that instead of on smoking.  -F/u phone call in 2 weeks to assess progress with reaching goal of reducing cigarettes by 4. Will continue to work down from there. She states that any time of day is good to call.

## 2021-08-31 NOTE — Progress Notes (Signed)
Reviewed: I agree with Dr. Koval's documentation and management. 

## 2021-08-31 NOTE — Patient Instructions (Addendum)
Nice meeting you today!  Goal for the next 2 weeks: Decrease # of cigarettes by 4  Increase varenicline to 1 mg twice daily with food. You'll need to pick up this higher dose from the pharmacy.   Focus on not smoking cigarettes back to back. Try to space them out.   I will call you in 2 weeks to see how you're doing on your goal.

## 2021-08-31 NOTE — Progress Notes (Signed)
° °  S:  Patient arrives ambulating without assistance and in good. Patient arrives for evaluation/assistance with tobacco dependence. Patient was referred and last seen by Primary Care Provider on 07/17/2021. Seen for initial tobacco cessation visit on 08/03/21 at which time bupropion was consolidated to daily dosing with the XL formulation, nortriptyline was increased, and varenicline was started.   Today, patient reports she is still smoking a pack per day (20 cigarettes) but has been trying to cut down. She reports that with the medication changes at last visit she is sleeping much better with no trouble falling asleep and sleeping through the night. She denies nocturnal awakening to smoke now. She has not noticed a change in her cravings or feelings of withdrawal. She takes care of her 38 year old grandson who does not like the smoke from her cigarettes. She set a goal to reduce her smoking by 4 cigarettes over the next 2 weeks. She feels 8/10 confidence that she will meet this goal.   Current tobacco cessation medications:  -Bupropion XL 150 mg daily after breakfast -Nortriptyline 25 mg daily at bedtime -Varenicline 0.5 mg BID with meals  Tobacco use history at baseline: -Age when started using tobacco on a daily basis since 56 years of age. Mostly 1 ppd, did smoke 2 ppd in 2014-2015. Decreased back to 1 ppd ever since her grandson stays with her.  -Brand smoked [place]. Number of cigarettes/day 20. Estimated nicotine content per cigarette (mg) > 1 mg.  -Estimated nicotine intake per day at baseline > 20mg .   -Smokes intermittently at night. Smokes only 1 time per night when she smokes. She admits that she does not sleep well.  -Most recent quit attempt - none -Longest time ever been tobacco free never for a single day. -Medications used in past cessation efforts include: NONE.   Most common triggers to use tobacco include; anger/stress, habit   Motivation to quit: Health "I need to  quit"  Clinical ASCVD: Yes   A/P: Tobacco use disorder with severe nicotine dependence of approximately 20 years duration in a patient who is good candidate for success because of current level of commitment/motivation. Diminished insomnia with recent dose changes of bupropion to XL and dose increase in nortriptyline from 10mg  to 25mg  QHS.    -Increase varenicline to 1 mg BID with food. Patient counseled on purpose, proper use, and potential adverse effects, including GI upset. New prescription provided.  -Continue bupropion XL 150 mg daily in the morning  -Continue nortriptyline 25 mg QHS -Counseled patient to try to avoid smoking cigarettes back to back but to try to space them out to help reduce the # of cigarettes per day. Also counseled her to find a hobby or something she enjoys doing to try to focus on that instead of on smoking.  -F/u phone call in 2 weeks to assess progress with reaching goal of reducing cigarettes by 4. Will continue to work down from there. She states that any time of day is good to call.   Written information provided. Total time in face-to-face counseling 24 minutes.   Patient seen with Gala Murdoch, PharmD Candidate, and Rebbeca Paul, PharmD - PGY2 Pharmacy Resident.

## 2021-09-02 ENCOUNTER — Ambulatory Visit: Payer: Medicare Other | Admitting: Physical Therapy

## 2021-09-02 ENCOUNTER — Encounter: Payer: Self-pay | Admitting: Physical Therapy

## 2021-09-02 ENCOUNTER — Other Ambulatory Visit: Payer: Self-pay

## 2021-09-02 DIAGNOSIS — M6281 Muscle weakness (generalized): Secondary | ICD-10-CM

## 2021-09-02 DIAGNOSIS — R2689 Other abnormalities of gait and mobility: Secondary | ICD-10-CM

## 2021-09-02 DIAGNOSIS — M545 Low back pain, unspecified: Secondary | ICD-10-CM

## 2021-09-02 NOTE — Therapy (Signed)
OUTPATIENT PHYSICAL THERAPY TREATMENT NOTE   Patient Name: Kendra [name] MRN: [id number] DOB:04-30-1966, 56 y.o., female Today's Date: 09/02/2021  PCP: Eppie Gibson, MD REFERRING PROVIDER: Lind Covert, *   PT End of Session - 09/02/21 1029     Visit Number 2    Number of Visits 16    Date for PT Re-Evaluation 10/11/21    Authorization Type UHC MCR - FOTO    Progress Note Due on Visit 10    PT Start Time 1030    PT Stop Time 1111    PT Time Calculation (min) 41 min             Past Medical History:  Diagnosis Date   Anemia    Asthma    Esophagitis    GERD (gastroesophageal reflux disease)    HTN (hypertension)    Hyperplastic colon polyp    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    Cor CTA 05/2019: Ca score 81.5 (96th percentile); mRCA < 25; dLM < 25; oLAD 25-49, pLAD < 25   Past Surgical History:  Procedure Laterality Date   ABDOMINAL HYSTERECTOMY  04/12/15   ANKLE SURGERY Left    CESAREAN SECTION     CYSTOSCOPY N/A 04/12/2015   Procedure: CYSTOSCOPY;  Surgeon: Lavonia Drafts, MD;  Location: [facility];  Service: Gynecology;  Laterality: N/A;   FINGER SURGERY     TIBIA FRACTURE SURGERY     TUBAL LIGATION     Patient Active Problem List   Diagnosis Date Noted   Bilateral leg pain 12/26/2020   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    Headache 09/25/2018   Hyperlipidemia 06/18/2018   Swelling of lower extremity 02/15/2016   Hemangioma of liver 07/07/2015   Tendinopathy of right rotator cuff 08/24/2014   DOE (dyspnea on exertion) 06/01/2013   Cervical neck pain with evidence of disc disease 05/14/2012   Osteoarthrosis, unspecified whether generalized or localized, involving lower leg 05/14/2012   Chronic low back pain 12/20/2010   Morbid obesity ([place]) 04/07/2008   TOBACCO ABUSE 04/07/2008   Gastroesophageal reflux disease 10/24/2006    REFERRING DIAG: Referral diagnosis: Chronic midline low back pain with sciatica, sciatica  laterality unspecified [M54.40, G89.29]  THERAPY DIAG:  Low back pain, unspecified back pain laterality, unspecified chronicity, unspecified whether sciatica present  Muscle weakness  Other abnormalities of gait and mobility  PERTINENT HISTORY: Bil knee pain  PRECAUTIONS/RESTRICTIONS:   PRECAUTIONS: None   WEIGHT BEARING RESTRICTIONS No  SUBJECTIVE:   Pt reports that she has some improvement with L lateral shift correction, but continue to have low back pain.  Pain:  Are you having pain? Yes Pain location: R sided back pain with radiation to bil knees NPRS scale: 6/10 Aggravating factors: Walking, laying down Relieving factors: "nothing" tylenol Pain description: constant, sharp, burning, and dull Severity: high Irritability: moderate/high Stage: Chronic Stability: getting worse 24 hour pattern: no clear pattern   OBJECTIVE:  LUMBAR AROM   R iliac crest is elevated   AROM AROM  08/16/2021  Flexion WNL, w/ concordant pain  Extension WNL, w/ concordant pain  Right lateral flexion limited by 25%, w/ concordant pain  Left lateral flexion limited by 25%, w/ concordant pain  Right rotation limited by 25%, w/ concordant pain  Left rotation limited by 25%, w/ concordant pain   (Blank rows = not tested)     LE MMT:   MMT Right 08/16/2021 Left 08/16/2021  Hip flexion (L2, L3)  Knee extension (L3)      Knee flexion      Hip abduction      Hip extension      Hip external rotation      Hip internal rotation      Hip adduction      Ankle dorsiflexion (L4)      Ankle plantarflexion (S1)      Ankle inversion      Ankle eversion      Great Toe ext (L5)         (Blank rows = not tested, score listed is out of 5 possible points.  N = WNL, D = diminished, * = concordant pain with testing)   TREATMENT 09/02/2021:  Therapeutic Exercise: - nu-step L5 36m while taking subjective and planning session with patient - L lateral shift correction: 20x - LTR - 20x - L  sided sciatic nerve glide - 2x15 - hip adduction squeeze 3'' 2x10 - alternating clam shell - RTB - 2x10 - PPT in HL - 3x10 - alternating clam - 3x10 ea  Manual Therapy: - gentle manual lumbar traction with P-ball  Patient Education: - HEP was updated and reissued to patient; pt educated on HEP, was provided handout, and verbally confirmed understanding of exercises.  HOME EXERCISE PROGRAM: Access Code: [email or web address] URL: [email or web address] Date: 09/02/2021 Prepared by: Shearon Balo  Exercises Lateral Shift Correction at [place] - 8 x daily - 7 x weekly - 2 sets - 10 reps Supine Posterior Pelvic Tilt - 2 x daily - 7 x weekly - 2 sets - 10 reps - 5'' hold Supine Hip Adduction Isometric with Ball - 1 x daily - 7 x weekly - 2 sets - 10 reps - 10'' hold      ASSESSMENT:   CLINICAL IMPRESSION: Crystel is progressing well with therapy.  Pt reports mild pain reduction following therapy.  Today we concentrated on core strengthening and hip strengthening.  Pt needs significant cuing for PPT, but is able to achieve after some time with good form.  Responds well to manual.  Pt will continue to benefit from skilled physical therapy to address remaining deficits and achieve listed goals.  Continue per POC.       GOALS:     SHORT TERM GOALS:   STG Name Target Date Goal status  1 Keziyah will be >75% HEP compliant to improve carryover between sessions and facilitate independent management of condition   Baseline: No HEP 09/06/2021 INITIAL    LONG TERM GOALS:    LTG Name Target Date Goal status  1 Klea will improve FOTO score from 50 (baseline) to 68 as a proxy for functional improvement 10/11/2021 INITIAL  2 Amelda will improve 30'' STS (MCID 2) to >/= 10x (w/ UE?: N) to show improved LE strength and improved transfers    Baseline: 7x  w/ UE? Y 10/11/2021 INITIAL  3 Devinne will improve 10 meter max gait speed to .9 m/s (.1 m/s MCID) to show functional improvement in ambulation     Baseline: .71 m/s 10/11/2021 INITIAL  4 Tempie will be able to stand for 30 min while making a meal, not limited by pain    Baseline: 5 min 10/11/2021 INITIAL  5 Mayia will be able to walk for 30 min, not limited by pain    Baseline: 5 min 10/11/2021 INITIAL    PLAN: PT FREQUENCY: 1-2x/week   PT DURATION: 8 weeks (Ending 10/11/2021)   PLANNED INTERVENTIONS: Therapeutic exercises, Therapeutic activity, Neuro Muscular  re-education, Gait training, Patient/Family education, Joint mobilization, Dry Needling, Electrical stimulation, Spinal mobilization and/or manipulation, Moist heat, Taping, Vasopneumatic device, Ionotophoresis 4mg /ml Dexamethasone, and Manual therapy   PLAN FOR NEXT SESSION: assess lateral shift directional preference effect, progressive core and hip strengthening, hip flexor stretching, PPT, dural glides    Mathis Dad 09/02/2021, 10:56 AM

## 2021-09-05 ENCOUNTER — Other Ambulatory Visit: Payer: Self-pay

## 2021-09-05 ENCOUNTER — Ambulatory Visit: Payer: Medicare Other | Admitting: Physical Therapy

## 2021-09-05 DIAGNOSIS — M6281 Muscle weakness (generalized): Secondary | ICD-10-CM | POA: Diagnosis not present

## 2021-09-05 DIAGNOSIS — R2689 Other abnormalities of gait and mobility: Secondary | ICD-10-CM

## 2021-09-05 DIAGNOSIS — M545 Low back pain, unspecified: Secondary | ICD-10-CM

## 2021-09-05 DIAGNOSIS — R0609 Other forms of dyspnea: Secondary | ICD-10-CM

## 2021-09-05 NOTE — Therapy (Signed)
OUTPATIENT PHYSICAL THERAPY TREATMENT NOTE   Patient Name: Kendra [name] MRN: [id number] DOB:Apr 20, 1966, 56 y.o., female Today's Date: 09/05/2021  PCP: Kendra Gibson, MD REFERRING PROVIDER: Lind [name], *   PT End of Session - 09/05/21 1044     Visit Number 3    Number of Visits 16    Date for PT Re-Evaluation 10/11/21    Authorization Type UHC MCR - FOTO    Progress Note Due on Visit 10    PT Start Time 0488    PT Stop Time 1128    PT Time Calculation (min) 43 min             Past Medical History:  Diagnosis Date   Anemia    Asthma    Esophagitis    GERD (gastroesophageal reflux disease)    HTN (hypertension)    Hyperplastic colon polyp    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    Cor CTA 05/2019: Ca score 81.5 (96th percentile); mRCA < 25; dLM < 25; oLAD 25-49, pLAD < 25   Past Surgical History:  Procedure Laterality Date   ABDOMINAL HYSTERECTOMY  04/12/15   ANKLE SURGERY Left    CESAREAN SECTION     CYSTOSCOPY N/A 04/12/2015   Procedure: CYSTOSCOPY;  Surgeon: Kendra Drafts, MD;  Location: [facility];  Service: Gynecology;  Laterality: N/A;   FINGER SURGERY     TIBIA FRACTURE SURGERY     TUBAL LIGATION     Patient Active Problem List   Diagnosis Date Noted   Bilateral leg pain 12/26/2020   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    Headache 09/25/2018   Hyperlipidemia 06/18/2018   Swelling of lower extremity 02/15/2016   Hemangioma of liver 07/07/2015   Tendinopathy of right rotator cuff 08/24/2014   DOE (dyspnea on exertion) 06/01/2013   Cervical neck pain with evidence of disc disease 05/14/2012   Osteoarthrosis, unspecified whether generalized or localized, involving lower leg 05/14/2012   Chronic low back pain 12/20/2010   Morbid obesity ([place]) 04/07/2008   TOBACCO ABUSE 04/07/2008   Gastroesophageal reflux disease 10/24/2006    REFERRING DIAG: Referral diagnosis: Chronic midline low back pain with sciatica, sciatica  laterality unspecified [M54.40, G89.29]  THERAPY DIAG:  Low back pain, unspecified back pain laterality, unspecified chronicity, unspecified whether sciatica present  Muscle weakness  Other abnormalities of gait and mobility  PERTINENT HISTORY: Bil knee pain  PRECAUTIONS/RESTRICTIONS:   PRECAUTIONS: None   WEIGHT BEARING RESTRICTIONS No  SUBJECTIVE:   Pt reports that she was feeling much better after last session.  Pain:  Are you having pain? Yes Pain location: R sided back pain with radiation to bil knees NPRS scale: 6/10 Aggravating factors: Walking, laying down Relieving factors: "nothing" tylenol Pain description: constant, sharp, burning, and dull Severity: high Irritability: moderate/high Stage: Chronic Stability: getting worse 24 hour pattern: no clear pattern   OBJECTIVE:  LUMBAR AROM   R iliac crest is elevated   AROM AROM  08/16/2021  Flexion WNL, w/ concordant pain  Extension WNL, w/ concordant pain  Right lateral flexion limited by 25%, w/ concordant pain  Left lateral flexion limited by 25%, w/ concordant pain  Right rotation limited by 25%, w/ concordant pain  Left rotation limited by 25%, w/ concordant pain   (Blank rows = not tested)     LE MMT:   MMT Right 08/16/2021 Left 08/16/2021  Hip flexion (L2, L3)      Knee extension (L3)  Knee flexion      Hip abduction      Hip extension      Hip external rotation      Hip internal rotation      Hip adduction      Ankle dorsiflexion (L4)      Ankle plantarflexion (S1)      Ankle inversion      Ankle eversion      Great Toe ext (L5)         (Blank rows = not tested, score listed is out of 5 possible points.  N = WNL, D = diminished, * = concordant pain with testing)   TREATMENT:  Therapeutic Exercise: - nu-step L6 56mwhile taking subjective and planning session with patient - L lateral shift correction: 20x - LTR - 20x - L sided sciatic nerve glide - 2x15 - Hip flexor stretch  - contralateral LE on bolster - x3 - hip adduction squeeze 2'' 2x10 - alternating clam shell - GTB - 2x10 - PPT in HL - 3x10 - supine bridge - 3x10 partial ROM  Manual Therapy: - gentle manual lumbar traction with P-ball - manual  L lateral shift correction  Patient Education: - HEP was updated and reissued to patient; .  HOME EXERCISE PROGRAM (pt educated on HEP, was provided handout, and verbally confirmed understanding of exercises as appropriate):  Access Code: [email or web address]: [email or web address] Date: 09/05/2021 Prepared by: KShearon Balo Exercises Lateral Shift Correction at [place]- 8 x daily - 7 x weekly - 2 sets - 10 reps Supine Posterior Pelvic Tilt - 2 x daily - 7 x weekly - 2 sets - 10 reps - 5'' hold Supine Hip Adduction Isometric with Ball - 1 x daily - 7 x weekly - 2 sets - 10 reps - 10'' hold Hooklying Clamshell with Resistance - 1 x daily - 7 x weekly - 3 sets - 10 reps       ASSESSMENT:   CLINICAL IMPRESSION: [name] progressing well with therapy.  Pt reports mild pain reduction following therapy.  Today we concentrated on core strengthening and hip strengthening.  Pt with improved PPT requiring min cuing.  She shows hip flexor tightness R>L.  Pt will continue to benefit from skilled physical therapy to address remaining deficits and achieve listed goals.  Continue per POC.       GOALS:     SHORT TERM GOALS:   STG Name Target Date Goal status  1 Kendra [name] will be >75% HEP compliant to improve carryover between sessions and facilitate independent management of condition   Baseline: No HEP 09/06/2021 MET 1/10    LONG TERM GOALS:    LTG Name Target Date Goal status  1 Kendra [name] will improve FOTO score from 50 (baseline) to 68 as a proxy for functional improvement 10/11/2021 INITIAL  2 Kendra [name] will improve 30'' STS (MCID 2) to >/= 10x (w/ UE?: N) to show improved LE strength and improved transfers    Baseline: 7x  w/ UE? Y 10/11/2021 INITIAL  3  Kendra [name] will improve 10 meter max gait speed to .9 m/s (.1 m/s MCID) to show functional improvement in ambulation    Baseline: .71 m/s 10/11/2021 INITIAL  4 Kendra [name] will be able to stand for 30 min while making a meal, not limited by pain    Baseline: 5 min 10/11/2021 INITIAL  5 Kendra [name] will be able to walk for 30 min, not limited by pain    Baseline: 5 min 10/11/2021  INITIAL    PLAN: PT FREQUENCY: 1-2x/week   PT DURATION: 8 weeks (Ending 10/11/2021)   PLANNED INTERVENTIONS: Therapeutic exercises, Therapeutic activity, Neuro Muscular re-education, Gait training, Patient/Family education, Joint mobilization, Dry Needling, Electrical stimulation, Spinal mobilization and/or manipulation, Moist heat, Taping, Vasopneumatic device, Ionotophoresis 57m/ml Dexamethasone, and Manual therapy   PLAN FOR NEXT SESSION: assess lateral shift directional preference effect, progressive core and hip strengthening, hip flexor stretching, PPT, dural glides    KKevan NyReinhartsen 09/05/2021, 11:30 AM

## 2021-09-06 MED ORDER — ALBUTEROL SULFATE HFA 108 (90 BASE) MCG/ACT IN AERS: INHALATION_SPRAY | RESPIRATORY_TRACT | 3 refills | Status: DC

## 2021-09-08 ENCOUNTER — Other Ambulatory Visit: Payer: Self-pay

## 2021-09-08 ENCOUNTER — Ambulatory Visit: Payer: Medicare Other | Admitting: Physical Therapy

## 2021-09-08 ENCOUNTER — Encounter: Payer: Self-pay | Admitting: Physical Therapy

## 2021-09-08 DIAGNOSIS — M545 Low back pain, unspecified: Secondary | ICD-10-CM | POA: Diagnosis not present

## 2021-09-08 DIAGNOSIS — M6281 Muscle weakness (generalized): Secondary | ICD-10-CM | POA: Diagnosis not present

## 2021-09-08 DIAGNOSIS — R2689 Other abnormalities of gait and mobility: Secondary | ICD-10-CM | POA: Diagnosis not present

## 2021-09-08 NOTE — Therapy (Signed)
OUTPATIENT PHYSICAL THERAPY TREATMENT NOTE   Patient Name: Kendra [name] MRN: [id number] DOB:10-Jun-1966, 56 y.o., female Today's Date: 09/08/2021  PCP: Eppie Gibson, MD REFERRING PROVIDER: Lind Covert, *   PT End of Session - 09/08/21 1126     Visit Number 4    Number of Visits 16    Date for PT Re-Evaluation 10/11/21    Authorization Type UHC MCR - FOTO    Progress Note Due on Visit 10    PT Start Time 1130    PT Stop Time 1211    PT Time Calculation (min) 41 min             Past Medical History:  Diagnosis Date   Anemia    Asthma    Esophagitis    GERD (gastroesophageal reflux disease)    HTN (hypertension)    Hyperplastic colon polyp    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    Cor CTA 05/2019: Ca score 81.5 (96th percentile); mRCA < 25; dLM < 25; oLAD 25-49, pLAD < 25   Past Surgical History:  Procedure Laterality Date   ABDOMINAL HYSTERECTOMY  04/12/15   ANKLE SURGERY Left    CESAREAN SECTION     CYSTOSCOPY N/A 04/12/2015   Procedure: CYSTOSCOPY;  Surgeon: Lavonia Drafts, MD;  Location: [facility];  Service: Gynecology;  Laterality: N/A;   FINGER SURGERY     TIBIA FRACTURE SURGERY     TUBAL LIGATION     Patient Active Problem List   Diagnosis Date Noted   Bilateral leg pain 12/26/2020   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    Headache 09/25/2018   Hyperlipidemia 06/18/2018   Swelling of lower extremity 02/15/2016   Hemangioma of liver 07/07/2015   Tendinopathy of right rotator cuff 08/24/2014   DOE (dyspnea on exertion) 06/01/2013   Cervical neck pain with evidence of disc disease 05/14/2012   Osteoarthrosis, unspecified whether generalized or localized, involving lower leg 05/14/2012   Chronic low back pain 12/20/2010   Morbid obesity ([place]) 04/07/2008   TOBACCO ABUSE 04/07/2008   Gastroesophageal reflux disease 10/24/2006    REFERRING DIAG: Referral diagnosis: Chronic midline low back pain with sciatica, sciatica  laterality unspecified [M54.40, G89.29]  THERAPY DIAG:  Low back pain, unspecified back pain laterality, unspecified chronicity, unspecified whether sciatica present  PERTINENT HISTORY: Bil knee pain  PRECAUTIONS/RESTRICTIONS:   PRECAUTIONS: None   WEIGHT BEARING RESTRICTIONS No  SUBJECTIVE:   I'm feeling a lot better.  Pain:  Are you having pain? Yes Pain location: R sided back pain with radiation to bil knees NPRS scale: 4/10 Aggravating factors: Walking, laying down Relieving factors: "nothing" tylenol Pain description: constant, sharp, burning, and dull Severity: high Irritability: moderate/high Stage: Chronic Stability: getting worse 24 hour pattern: no clear pattern   OBJECTIVE:  LUMBAR AROM   R iliac crest is elevated   AROM AROM  08/16/2021  Flexion WNL, w/ concordant pain  Extension WNL, w/ concordant pain  Right lateral flexion limited by 25%, w/ concordant pain  Left lateral flexion limited by 25%, w/ concordant pain  Right rotation limited by 25%, w/ concordant pain  Left rotation limited by 25%, w/ concordant pain   (Blank rows = not tested)     LE MMT:   MMT Right 08/16/2021 Left 08/16/2021  Hip flexion (L2, L3)      Knee extension (L3)      Knee flexion      Hip abduction  Hip extension      Hip external rotation      Hip internal rotation      Hip adduction      Ankle dorsiflexion (L4)      Ankle plantarflexion (S1)      Ankle inversion      Ankle eversion      Great Toe ext (L5)         (Blank rows = not tested, score listed is out of 5 possible points.  N = WNL, D = diminished, * = concordant pain with testing)   TREATMENT:  Therapeutic Exercise: - nu-step L6 7mwhile taking subjective and planning session with patient - L lateral shift correction: 20x - LTR - 20x - L sided sciatic nerve glide - 2x20 - Hip flexor stretch - contralateral LE on bolster - x3 - hip adduction squeeze 5'' 2x10 - alternating clam shell -  Blue TB - 3x10 ea - PPT in HL - 3x10 - supine bridge - 4x10 - alternating SLR from foam roller - 2x10 ea  Manual Therapy: - gentle manual lumbar traction with P-ball  HOME EXERCISE PROGRAM (pt educated on HEP, was provided handout, and verbally confirmed understanding of exercises as appropriate):  Access Code: [email or web address]: [email or web address] Date: 09/05/2021 Prepared by: KShearon Balo Exercises Lateral Shift Correction at [place]- 8 x daily - 7 x weekly - 2 sets - 10 reps Supine Posterior Pelvic Tilt - 2 x daily - 7 x weekly - 2 sets - 10 reps - 5'' hold Supine Hip Adduction Isometric with Ball - 1 x daily - 7 x weekly - 2 sets - 10 reps - 10'' hold Hooklying Clamshell with Resistance - 1 x daily - 7 x weekly - 3 sets - 10 reps       ASSESSMENT:   CLINICAL IMPRESSION: Kendra [name] progressing well with therapy.  Pt reports no increase in baseline pain following therapy.  Today we concentrated on core strengthening and hip strengthening.  Pt continues to progress mat core and hip exercises as expected with corresponding reduction in pain during ADLs.  Pt will continue to benefit from skilled physical therapy to address remaining deficits and achieve listed goals.  Continue per POC.       GOALS:     SHORT TERM GOALS:   STG Name Target Date Goal status  1 Kendra [name] will be >75% HEP compliant to improve carryover between sessions and facilitate independent management of condition   Baseline: No HEP 09/06/2021 MET 1/10    LONG TERM GOALS:    LTG Name Target Date Goal status  1 Kendra [name] will improve FOTO score from 50 (baseline) to 68 as a proxy for functional improvement 10/11/2021 INITIAL  2 Kendra [name] will improve 30'' STS (MCID 2) to >/= 10x (w/ UE?: N) to show improved LE strength and improved transfers    Baseline: 7x  w/ UE? Y 10/11/2021 INITIAL  3 Kendra [name] will improve 10 meter max gait speed to .9 m/s (.1 m/s MCID) to show functional improvement in ambulation     Baseline: .71 m/s 10/11/2021 INITIAL  4 Kendra [name] will be able to stand for 30 min while making a meal, not limited by pain    Baseline: 5 min 10/11/2021 INITIAL  5 Kendra [name] will be able to walk for 30 min, not limited by pain    Baseline: 5 min 10/11/2021 INITIAL    PLAN: PT FREQUENCY: 1-2x/week   PT DURATION: 8 weeks (Ending 10/11/2021)  PLANNED INTERVENTIONS: Therapeutic exercises, Therapeutic activity, Neuro Muscular re-education, Gait training, Patient/Family education, Joint mobilization, Dry Needling, Electrical stimulation, Spinal mobilization and/or manipulation, Moist heat, Taping, Vasopneumatic device, Ionotophoresis 46m/ml Dexamethasone, and Manual therapy   PLAN FOR NEXT SESSION: assess lateral shift directional preference effect, progressive core and hip strengthening, hip flexor stretching, PPT, dural glides    KMathis Dad1/13/2023, 12:11 PM

## 2021-09-09 ENCOUNTER — Other Ambulatory Visit: Payer: Self-pay | Admitting: Student

## 2021-09-09 DIAGNOSIS — M7989 Other specified soft tissue disorders: Secondary | ICD-10-CM

## 2021-09-12 ENCOUNTER — Ambulatory Visit: Payer: Medicare Other | Admitting: Physical Therapy

## 2021-09-14 ENCOUNTER — Ambulatory Visit: Payer: Medicare Other | Admitting: Physical Therapy

## 2021-09-14 ENCOUNTER — Telehealth: Payer: Self-pay | Admitting: Student-PharmD

## 2021-09-14 NOTE — Telephone Encounter (Signed)
Called patient from [facility] after appt 2 weeks ago with Dr. Valentina Lucks to follow up on progress with smoking cessation.   Unable to reach patient, LVM requesting call back.

## 2021-09-18 ENCOUNTER — Ambulatory Visit: Payer: Medicare Other | Admitting: Podiatry

## 2021-09-20 ENCOUNTER — Ambulatory Visit: Payer: Medicare Other | Admitting: Physical Therapy

## 2021-09-21 NOTE — Telephone Encounter (Signed)
LVM requesting call back.

## 2021-09-22 ENCOUNTER — Ambulatory Visit: Payer: Medicare Other | Admitting: Physical Therapy

## 2021-09-27 ENCOUNTER — Ambulatory Visit: Payer: Medicare Other | Attending: Family Medicine | Admitting: Physical Therapy

## 2021-09-27 DIAGNOSIS — M6281 Muscle weakness (generalized): Secondary | ICD-10-CM | POA: Insufficient documentation

## 2021-09-27 DIAGNOSIS — M545 Low back pain, unspecified: Secondary | ICD-10-CM | POA: Insufficient documentation

## 2021-09-27 DIAGNOSIS — R2689 Other abnormalities of gait and mobility: Secondary | ICD-10-CM | POA: Insufficient documentation

## 2021-09-28 NOTE — Telephone Encounter (Signed)
Third attempt to reach patient. LVM requesting call back.

## 2021-09-29 ENCOUNTER — Ambulatory Visit: Payer: Medicare Other | Admitting: Physical Therapy

## 2021-10-04 ENCOUNTER — Ambulatory Visit: Payer: Medicare Other | Admitting: Physical Therapy

## 2021-10-06 ENCOUNTER — Ambulatory Visit: Payer: Medicare Other | Admitting: Physical Therapy

## 2021-10-11 ENCOUNTER — Ambulatory Visit: Payer: Medicare Other | Admitting: Physical Therapy

## 2021-10-11 ENCOUNTER — Other Ambulatory Visit: Payer: Self-pay | Admitting: Student

## 2021-10-11 DIAGNOSIS — K219 Gastro-esophageal reflux disease without esophagitis: Secondary | ICD-10-CM

## 2021-10-19 ENCOUNTER — Ambulatory Visit: Payer: Medicare Other | Admitting: Physical Therapy

## 2021-10-19 ENCOUNTER — Encounter: Payer: Self-pay | Admitting: Physical Therapy

## 2021-10-19 ENCOUNTER — Other Ambulatory Visit: Payer: Self-pay

## 2021-10-19 DIAGNOSIS — M6281 Muscle weakness (generalized): Secondary | ICD-10-CM | POA: Diagnosis not present

## 2021-10-19 DIAGNOSIS — R2689 Other abnormalities of gait and mobility: Secondary | ICD-10-CM | POA: Diagnosis not present

## 2021-10-19 DIAGNOSIS — M545 Low back pain, unspecified: Secondary | ICD-10-CM | POA: Diagnosis not present

## 2021-10-19 NOTE — Therapy (Signed)
OUTPATIENT PHYSICAL THERAPY TREATMENT NOTE   Patient Name: Kendra [name] MRN: [id number] DOB:1965-12-05, 56 y.o., female Today's Date: 10/19/2021  PCP: Kendra Gibson, MD REFERRING PROVIDER: Lind [name], *   PT End of Session - 10/19/21 1044     Visit Number 5    Date for PT Re-Evaluation 12/14/21    Authorization Type UHC MCR - FOTO    Progress Note Due on Visit 10    PT Start Time 1045    PT Stop Time 1128    PT Time Calculation (min) 43 min             Past Medical History:  Diagnosis Date   Anemia    Asthma    Esophagitis    GERD (gastroesophageal reflux disease)    HTN (hypertension)    Hyperplastic colon polyp    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    Cor CTA 05/2019: Ca score 81.5 (96th percentile); mRCA < 25; dLM < 25; oLAD 25-49, pLAD < 25   Past Surgical History:  Procedure Laterality Date   ABDOMINAL HYSTERECTOMY  04/12/15   ANKLE SURGERY Left    CESAREAN SECTION     CYSTOSCOPY N/A 04/12/2015   Procedure: CYSTOSCOPY;  Surgeon: Kendra Drafts, MD;  Location: [facility];  Service: Gynecology;  Laterality: N/A;   FINGER SURGERY     TIBIA FRACTURE SURGERY     TUBAL LIGATION     Patient Active Problem List   Diagnosis Date Noted   Bilateral leg pain 12/26/2020   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    Headache 09/25/2018   Hyperlipidemia 06/18/2018   Swelling of lower extremity 02/15/2016   Hemangioma of liver 07/07/2015   Tendinopathy of right rotator cuff 08/24/2014   DOE (dyspnea on exertion) 06/01/2013   Cervical neck pain with evidence of disc disease 05/14/2012   Osteoarthrosis, unspecified whether generalized or localized, involving lower leg 05/14/2012   Chronic low back pain 12/20/2010   Morbid obesity ([name]) 04/07/2008   TOBACCO ABUSE 04/07/2008   Gastroesophageal reflux disease 10/24/2006    REFERRING DIAG: Referral diagnosis: Chronic midline low back pain with sciatica, sciatica laterality unspecified  [M54.40, G89.29]  THERAPY DIAG:  Low back pain, unspecified back pain laterality, unspecified chronicity, unspecified whether sciatica present  Muscle weakness  Other abnormalities of gait and mobility  PERTINENT HISTORY: Bil knee pain  PRECAUTIONS/RESTRICTIONS:   PRECAUTIONS: None   WEIGHT BEARING RESTRICTIONS No  SUBJECTIVE:  Pt reports that her life has been "hectic" and that she has had a lot of ankle pain.  She feels that PT was really helping her back and wants to resume.  Pain:  Are you having pain? Yes Pain location: R sided back pain with radiation to bil knees NPRS scale: 4/10 Aggravating factors: Walking, laying down Relieving factors: "nothing" tylenol Pain description: constant, sharp, burning, and dull Severity: high Irritability: moderate/high Stage: Chronic Stability: getting worse 24 hour pattern: no clear pattern   OBJECTIVE:  LUMBAR AROM   R iliac crest is elevated   AROM AROM  08/16/2021  Flexion WNL, w/ concordant pain  Extension WNL, w/ concordant pain  Right lateral flexion limited by 25%, w/ concordant pain  Left lateral flexion limited by 25%, w/ concordant pain  Right rotation limited by 25%, w/ concordant pain  Left rotation limited by 25%, w/ concordant pain   (Blank rows = not tested)     LE MMT:   MMT Right 08/16/2021 Left 08/16/2021  Hip flexion (  L2, L3)      Knee extension (L3)      Knee flexion      Hip abduction      Hip extension      Hip external rotation      Hip internal rotation      Hip adduction      Ankle dorsiflexion (L4)      Ankle plantarflexion (S1)      Ankle inversion      Ankle eversion      Great Toe ext (L5)         (Blank rows = not tested, score listed is out of 5 possible points.  N = WNL, D = diminished, * = concordant pain with testing)  .[email or web address]  TREATMENT:  Therapeutic Exercise: - nu-step L6 40mwhile taking subjective and planning session with patient - L lateral shift correction:  20x - LTR - 20x - L sided sciatic nerve glide - 2x20 - Hip flexor stretch - contralateral LE on bolster - x3 (NT) - hip adduction squeeze 5'' 2x10 - alternating clam shell - black TB - 3x10 ea - Isometric abdominal squeeze - 3x10 - PPT in HL - 2x10 5'' hold - supine bridge - 4x10 - alternating SLR from foam roller - 2x10 ea - STS from table - 3x10  Manual Therapy: - gentle manual lumbar traction with P-ball  HOME EXERCISE PROGRAM (pt educated on HEP, was provided handout, and verbally confirmed understanding of exercises as appropriate):  Access Code: [email or web address]: [email or web address] Date: 09/05/2021 Prepared by: Kendra [name] Exercises Lateral [place]- 8 x daily - 7 x weekly - 2 sets - 10 reps Supine Posterior Pelvic Tilt - 2 x daily - 7 x weekly - 2 sets - 10 reps - 5'' hold Supine Hip Adduction Isometric with Ball - 1 x daily - 7 x weekly - 2 sets - 10 reps - 10'' hold Hooklying Clamshell with Resistance - 1 x daily - 7 x weekly - 3 sets - 10 reps       ASSESSMENT:   CLINICAL IMPRESSION: Kendra [name] to PT today after a significant gap.  She has been dealing with some other medical issues, but wants to continue with PT for her back pain as she feels it is quite effective.  She demonstrates significant deficits in core and hip strength and endurance today during mat exercises.  She will benefit from skilled PT to address these deficits and improve her ability to complete ADLs involving lifting, bending, and squatting.       GOALS:     SHORT TERM GOALS:   STG Name Target Date Goal status  1 Kendra [name] will be >75% HEP compliant to improve carryover between sessions and facilitate independent management of condition   Baseline: No HEP 09/06/2021 MET 1/10    LONG TERM GOALS:    LTG Name Target Date Goal status  1 Kendra [name] will improve FOTO score from 50 (baseline) to 68 as a proxy for functional improvement 12/14/21 INITIAL  2 Kendra [name]  will improve 30'' STS (MCID 2) to >/= 10x (w/ UE?: N) to show improved LE strength and improved transfers    Baseline: 7x  w/ UE? Y 12/14/21 INITIAL  3 Kendra [name] will improve 10 meter max gait speed to .9 m/s (.1 m/s MCID) to show functional improvement in ambulation    Baseline: .71 m/s 12/14/21 INITIAL  4 Kendra [name] will be able to stand for 30 min while making a  meal, not limited by pain    Baseline: 5 min 12/14/21 INITIAL  5 Helene will be able to walk for 30 min, not limited by pain    Baseline: 5 min 12/14/21 INITIAL    PLAN: PT FREQUENCY: 1-2x/week   PT DURATION: 8 weeks (Ending 12/14/21)   PLANNED INTERVENTIONS: Therapeutic exercises, Therapeutic activity, Neuro Muscular re-education, Gait training, Patient/Family education, Joint mobilization, Dry Needling, Electrical stimulation, Spinal mobilization and/or manipulation, Moist heat, Taping, Vasopneumatic device, Ionotophoresis 23m/ml Dexamethasone, and Manual therapy   PLAN FOR NEXT SESSION: assess lateral shift directional preference effect, progressive core and hip strengthening, hip flexor stretching, PPT, dural glides    KMathis Dad2/23/2023, 11:37 AM

## 2021-10-25 ENCOUNTER — Ambulatory Visit (INDEPENDENT_AMBULATORY_CARE_PROVIDER_SITE_OTHER): Payer: Medicare Other | Admitting: Family Medicine

## 2021-10-26 ENCOUNTER — Other Ambulatory Visit: Payer: Self-pay

## 2021-10-26 ENCOUNTER — Ambulatory Visit: Payer: Medicare Other | Attending: Family Medicine

## 2021-10-26 ENCOUNTER — Other Ambulatory Visit: Payer: Self-pay | Admitting: Student

## 2021-10-26 DIAGNOSIS — M545 Low back pain, unspecified: Secondary | ICD-10-CM | POA: Insufficient documentation

## 2021-10-26 DIAGNOSIS — M6281 Muscle weakness (generalized): Secondary | ICD-10-CM | POA: Diagnosis not present

## 2021-10-26 DIAGNOSIS — Z1231 Encounter for screening mammogram for malignant neoplasm of breast: Secondary | ICD-10-CM

## 2021-10-26 DIAGNOSIS — R2689 Other abnormalities of gait and mobility: Secondary | ICD-10-CM | POA: Insufficient documentation

## 2021-10-26 NOTE — Therapy (Signed)
?OUTPATIENT PHYSICAL THERAPY TREATMENT NOTE ? ? ?Patient Name: Kendra [name] ?MRN: [id number] ?DOB:02/05/1966, 56 y.o., female ?Today's Date: 10/26/2021 ? ?PCP: Eppie Gibson, MD ?REFERRING PROVIDER: Lind Covert, * ? ? PT End of Session - 10/26/21 1209   ? ? Visit Number 6   ? Number of Visits 16   ? Date for PT Re-Evaluation 12/14/21   ? Authorization Type UHC MCR - FOTO   ? Progress Note Due on Visit 10   ? PT Start Time 1208   ? PT Stop Time 1248   ? PT Time Calculation (min) 40 min   ? Activity Tolerance Patient tolerated treatment well   ? Behavior During Therapy --   ? ?  ?  ? ?  ? ? ? ?Past Medical History:  ?Diagnosis Date  ? Anemia   ? Asthma   ? Esophagitis   ? GERD (gastroesophageal reflux disease)   ? HTN (hypertension)   ? Hyperplastic colon polyp   ? 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 Cor CTA 05/2019: Ca score 81.5 (96th percentile); mRCA < 25; dLM < 25; oLAD 25-49, pLAD < 25  ? ?Past Surgical History:  ?Procedure Laterality Date  ? ABDOMINAL HYSTERECTOMY  04/12/15  ? ANKLE SURGERY Left   ? CESAREAN SECTION    ? CYSTOSCOPY N/A 04/12/2015  ? Procedure: CYSTOSCOPY;  Surgeon: Lavonia Drafts, MD;  Location: [facility];  Service: Gynecology;  Laterality: N/A;  ? FINGER SURGERY    ? TIBIA FRACTURE SURGERY    ? TUBAL LIGATION    ? ?Patient Active Problem List  ? Diagnosis Date Noted  ? Bilateral leg pain 12/26/2020  ? 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 Headache 09/25/2018  ? Hyperlipidemia 06/18/2018  ? Swelling of lower extremity 02/15/2016  ? Hemangioma of liver 07/07/2015  ? Tendinopathy of right rotator cuff 08/24/2014  ? DOE (dyspnea on exertion) 06/01/2013  ? Cervical neck pain with evidence of disc disease 05/14/2012  ? Osteoarthrosis, unspecified whether generalized or localized, involving lower leg 05/14/2012  ? Chronic low back pain 12/20/2010  ? Morbid obesity ([place]) 04/07/2008  ? TOBACCO ABUSE 04/07/2008  ? Gastroesophageal reflux disease 10/24/2006   ? ? ?REFERRING DIAG: Referral diagnosis: Chronic midline low back pain with sciatica, sciatica laterality unspecified [M54.40, G89.29] ? ?THERAPY DIAG:  ?Low back pain, unspecified back pain laterality, unspecified chronicity, unspecified whether sciatica present ? ?Muscle weakness ? ?Other abnormalities of gait and mobility ? ?PERTINENT HISTORY: Bil knee pain ? ?PRECAUTIONS/RESTRICTIONS:  ? ?PRECAUTIONS: None ?  ?WEIGHT BEARING RESTRICTIONS No ? ?SUBJECTIVE: I'm hurting a little bit today, tylenol sort of helps. I do my exercises when I can. ? ?Pain:  ?Are you having pain? Yes ?Pain location: R sided back pain with radiation to bil knees ?NPRS scale: 6/10 ?Aggravating factors: Walking, laying down ?Relieving factors: "nothing" tylenol ?Pain description: constant, sharp, burning, and dull ?Severity: high ?Irritability: moderate/high ?Stage: Chronic ?Stability: getting worse ?24 hour pattern: no clear pattern  ? ?OBJECTIVE: ? ?LUMBAR AROM ?  ?R iliac crest is elevated ?  ?AROM AROM  ?08/16/2021  ?Flexion WNL, w/ concordant pain  ?Extension WNL, w/ concordant pain  ?Right lateral flexion limited by 25%, w/ concordant pain  ?Left lateral flexion limited by 25%, w/ concordant pain  ?Right rotation limited by 25%, w/ concordant pain  ?Left rotation limited by 25%, w/ concordant pain  ? (Blank rows = not tested) ?  ?  ?LE MMT: ?  ?  MMT Right ?08/16/2021 Left ?08/16/2021  ?Hip flexion (L2, L3)      ?Knee extension (L3)      ?Knee flexion      ?Hip abduction      ?Hip extension      ?Hip external rotation      ?Hip internal rotation      ?Hip adduction      ?Ankle dorsiflexion (L4)      ?Ankle plantarflexion (S1)      ?Ankle inversion      ?Ankle eversion      ?Great Toe ext (L5)      ?  ? (Blank rows = not tested, score listed is out of 5 possible points.  N = WNL, D = diminished, * = concordant pain with testing) ? ?FOTO: ?08/16/21: 50% ?10/26/2021: 40% ? ? ?TREATMENT: ?[facility]:                                                 DATE: 10/26/2021 ?Therapeutic Exercise: ?Nustep level 5 x5 min while gathering subjective ?PPT 5" hold 2 x 10 ?PPT with hip adduction ball squeeze 5" hold 2 x 10 ?PPT with march 2 x 10 ?Bridge 3" hold at top 3 x 10 ?LTR 2 x 10 ?Hooklying clamshell BlackTB 3 x 10 ?STS 2 x 10 ?SLR 2 x 10 BIL ?Cybex hip abduction 12.5# 2 x 10 BIL ?Cybex hip extension 12.5# 2 x 10 BIL ? ? ?Therapeutic Exercise: ?- nu-step L6 91mwhile taking subjective and planning session with patient ?- L lateral shift correction: 20x ?- LTR - 20x ?- L sided sciatic nerve glide - 2x20 ?- Hip flexor stretch - contralateral LE on bolster - x3 (NT) ?- hip adduction squeeze 5'' 2x10 ?- alternating clam shell - black TB - 3x10 ea ?- Isometric abdominal squeeze - 3x10 ?- PPT in HL - 2x10 5'' hold ?- supine bridge - 4x10 ?- alternating SLR from foam roller - 2x10 ea ?- STS from table - 3x10 ? ?Manual Therapy: ?- gentle manual lumbar traction with P-ball ? ?HOME EXERCISE PROGRAM (pt educated on HEP, was provided handout, and verbally confirmed understanding of exercises as appropriate): ? ?Access Code: [email or web address]?URL: [email or web address] ?Date: 09/05/2021 ?Prepared by: KShearon Balo? ?Exercises ?Lateral Shift Correction at Wall - 8 x daily - 7 x weekly - 2 sets - 10 reps ?Supine Posterior Pelvic Tilt - 2 x daily - 7 x weekly - 2 sets - 10 reps - 5'' hold ?Supine Hip Adduction Isometric with Ball - 1 x daily - 7 x weekly - 2 sets - 10 reps - 10'' hold ?Hooklying Clamshell with Resistance - 1 x daily - 7 x weekly - 3 sets - 10 reps ? ? ?  ?  ?ASSESSMENT: ?  ?CLINICAL IMPRESSION: ?Patient presents to PT with moderate levels of low back pain and reports occasional HEP compliance. Today's session focused on improving proximal hip and core strength and increasing overall endurance for exercise tolerance. Patient's FOTO score has decreased since the first session in December, likely due to large gap in care. Plan to incorporate more  standing exercises next session. Patient continues to benefit from skilled PT services and should be progressed as able to improve functional independence. ? ?   ?GOALS: ?  ?  ?SHORT TERM GOALS: ?  ?STG Name Target  Date Goal status  ?1 Zykia will be >75% HEP compliant to improve carryover between sessions and facilitate independent management of condition ?  ?Baseline: No HEP 09/06/2021 MET 1/10  ?  ?LONG TERM GOALS:  ?  ?LTG Name Target Date Goal status  ?1 Zorina will improve FOTO score from 50 (baseline) to 68 as a proxy for functional improvement ?10/26/2021: 40% 12/14/21 INITIAL  ?2 Camela will improve 30'' STS (MCID 2) to >/= 10x (w/ UE?: N) to show improved LE strength and improved transfers  ?  ?Baseline: 7x  w/ UE? Y 12/14/21 INITIAL  ?3 Marley will improve 10 meter max gait speed to .9 m/s (.1 m/s MCID) to show functional improvement in ambulation  ?  ?Baseline: .71 m/s 12/14/21 INITIAL  ?4 Mayme will be able to stand for 30 min while making a meal, not limited by pain  ?  ?Baseline: 5 min 12/14/21 INITIAL  ?5 Skyelar will be able to walk for 30 min, not limited by pain  ?  ?Baseline: 5 min 12/14/21 INITIAL  ?  ?PLAN: ?PT FREQUENCY: 1-2x/week ?  ?PT DURATION: 8 weeks (Ending 12/14/21) ?  ?PLANNED INTERVENTIONS: Therapeutic exercises, Therapeutic activity, Neuro Muscular re-education, Gait training, Patient/Family education, Joint mobilization, Dry Needling, Electrical stimulation, Spinal mobilization and/or manipulation, Moist heat, Taping, Vasopneumatic device, Ionotophoresis 63m/ml Dexamethasone, and Manual therapy ?  ?PLAN FOR NEXT SESSION: assess lateral shift directional preference effect, progressive core and hip strengthening, hip flexor stretching, PPT, dural glides, palloff press ? ? ? ?SEvelene Croon?10/26/2021, 12:48 PM ? ?  ? ?

## 2021-10-31 ENCOUNTER — Telehealth: Payer: Self-pay | Admitting: Physical Therapy

## 2021-10-31 ENCOUNTER — Ambulatory Visit: Payer: Medicare Other | Admitting: Physical Therapy

## 2021-10-31 NOTE — Telephone Encounter (Signed)
Called and informed patient of missed visit and provided reminder of next appt and attendance policy.  Will need to cancel all remaining appts if 1 more n/s or late cancel d/t attendance policy. ?

## 2021-11-01 ENCOUNTER — Ambulatory Visit (INDEPENDENT_AMBULATORY_CARE_PROVIDER_SITE_OTHER): Payer: Medicare Other | Admitting: Podiatry

## 2021-11-01 ENCOUNTER — Other Ambulatory Visit: Payer: Self-pay

## 2021-11-01 DIAGNOSIS — B351 Tinea unguium: Secondary | ICD-10-CM | POA: Diagnosis not present

## 2021-11-01 DIAGNOSIS — M7752 Other enthesopathy of left foot: Secondary | ICD-10-CM | POA: Diagnosis not present

## 2021-11-01 DIAGNOSIS — M79675 Pain in left toe(s): Secondary | ICD-10-CM | POA: Diagnosis not present

## 2021-11-01 DIAGNOSIS — M79674 Pain in right toe(s): Secondary | ICD-10-CM | POA: Diagnosis not present

## 2021-11-02 ENCOUNTER — Encounter: Payer: Self-pay | Admitting: Podiatry

## 2021-11-02 ENCOUNTER — Ambulatory Visit: Payer: Medicare Other | Admitting: Physical Therapy

## 2021-11-02 ENCOUNTER — Encounter: Payer: Self-pay | Admitting: Physical Therapy

## 2021-11-02 DIAGNOSIS — R2689 Other abnormalities of gait and mobility: Secondary | ICD-10-CM | POA: Diagnosis not present

## 2021-11-02 DIAGNOSIS — M545 Low back pain, unspecified: Secondary | ICD-10-CM

## 2021-11-02 DIAGNOSIS — M6281 Muscle weakness (generalized): Secondary | ICD-10-CM | POA: Diagnosis not present

## 2021-11-02 NOTE — Therapy (Signed)
OUTPATIENT PHYSICAL THERAPY TREATMENT NOTE   Patient Name: Kendra [name] MRN: [id number] DOB:[birth year], 56 y.o., female Today's Date: 11/02/2021  PCP: Kendra Gibson, MD REFERRING PROVIDER: Lind [name], *   PT End of Session - 11/02/21 1130     Visit Number 7    Number of Visits 16    Date for PT Re-Evaluation 12/14/21    Authorization Type UHC MCR - FOTO    Progress Note Due on Visit 10    PT Start Time 1130    PT Stop Time 1210    PT Time Calculation (min) 40 min    Activity Tolerance Patient tolerated treatment well              Past Medical History:  Diagnosis Date   Anemia    Asthma    Esophagitis    GERD (gastroesophageal reflux disease)    HTN (hypertension)    Hyperplastic colon polyp    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    Cor CTA 05/2019: Ca score 81.5 (96th percentile); mRCA < 25; dLM < 25; oLAD 25-49, pLAD < 25   Past Surgical History:  Procedure Laterality Date   ABDOMINAL HYSTERECTOMY  04/12/15   ANKLE SURGERY Left    CESAREAN SECTION     CYSTOSCOPY N/A 04/12/2015   Procedure: CYSTOSCOPY;  Surgeon: Kendra Drafts, MD;  Location: [facility];  Service: Gynecology;  Laterality: N/A;   FINGER SURGERY     TIBIA FRACTURE SURGERY     TUBAL LIGATION     Patient Active Problem List   Diagnosis Date Noted   Bilateral leg pain 12/26/2020   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    Headache 09/25/2018   Hyperlipidemia 06/18/2018   Swelling of lower extremity 02/15/2016   Hemangioma of liver 07/07/2015   Tendinopathy of right rotator cuff 08/24/2014   DOE (dyspnea on exertion) 06/01/2013   Cervical neck pain with evidence of disc disease 05/14/2012   Osteoarthrosis, unspecified whether generalized or localized, involving lower leg 05/14/2012   Chronic low back pain 12/20/2010   Morbid obesity ([place]) 04/07/2008   TOBACCO ABUSE 04/07/2008   Gastroesophageal reflux disease 10/24/2006    REFERRING DIAG: Referral diagnosis:  Chronic midline low back pain with sciatica, sciatica laterality unspecified [M54.40, G89.29]  THERAPY DIAG:  Low back pain, unspecified back pain laterality, unspecified chronicity, unspecified whether sciatica present  Muscle weakness  Other abnormalities of gait and mobility  PERTINENT HISTORY: Bil knee pain  PRECAUTIONS/RESTRICTIONS:   PRECAUTIONS: None   WEIGHT BEARING RESTRICTIONS No  SUBJECTIVE:  Pt reports that her back pain is improving.  She has quite a bit of pain in her knees through.  Pain:  Are you having pain? Yes Pain location: R sided back pain with radiation to bil knees NPRS scale: 3/10 Aggravating factors: Walking, laying down Relieving factors: "nothing" tylenol Pain description: constant, sharp, burning, and dull Severity: high Irritability: moderate/high Stage: Chronic 24 hour pattern: no clear pattern   OBJECTIVE:  LUMBAR AROM   R iliac crest is elevated   AROM AROM  08/16/2021  Flexion WNL, w/ concordant pain  Extension WNL, w/ concordant pain  Right lateral flexion limited by 25%, w/ concordant pain  Left lateral flexion limited by 25%, w/ concordant pain  Right rotation limited by 25%, w/ concordant pain  Left rotation limited by 25%, w/ concordant pain   (Blank rows = not tested)     LE MMT:   MMT Right 08/16/2021 Left 08/16/2021  Hip flexion (L2, L3)      Knee extension (L3)      Knee flexion      Hip abduction      Hip extension      Hip external rotation      Hip internal rotation      Hip adduction      Ankle dorsiflexion (L4)      Ankle plantarflexion (S1)      Ankle inversion      Ankle eversion      Great Toe ext (L5)         (Blank rows = not tested, score listed is out of 5 possible points.  N = WNL, D = diminished, * = concordant pain with testing)  FOTO: 08/16/21: 50% 10/26/2021: 40%   TREATMENT:  Treatment: 11/02/2021 Therapeutic Exercise: - nu-step L6 20mwhile taking subjective and planning session  with patient - L lateral shift correction: 20x - LTR - 20x - L sided sciatic nerve glide - 2x20 - hip adduction with pilates ring 2x10 - bridge + black TB ER - 3x10 - Isometric abdominal squeeze - 2x10 3'' hold - Pball - alternating SLR from foam roller - 2x10 ea - 2# - quad set - 2x10 - STS from table - 3x10  [facility] Adult PT Treatment:                                                DATE: 10/26/2021 Therapeutic Exercise: Nustep level 5 x5 min while gathering subjective PPT 5" hold 2 x 10 PPT with hip adduction ball squeeze 5" hold 2 x 10 PPT with march 2 x 10 Bridge 3" hold at top 3 x 10 LTR 2 x 10 Hooklying clamshell BlackTB 3 x 10 STS 2 x 10 SLR 2 x 10 BIL Cybex hip abduction 12.5# 2 x 10 BIL Cybex hip extension 12.5# 2 x 10 BIL   Therapeutic Exercise: - nu-step L6 532mhile taking subjective and planning session with patient - L lateral shift correction: 20x - LTR - 20x - L sided sciatic nerve glide - 2x20 - Hip flexor stretch - contralateral LE on bolster - x3 (NT) - hip adduction squeeze 5'' 2x10 - alternating clam shell - black TB - 3x10 ea - Isometric abdominal squeeze - 3x10 - PPT in HL - 2x10 5'' hold - supine bridge - 4x10 - alternating SLR from foam roller - 2x10 ea - STS from table - 3x10  Manual Therapy: - gentle manual lumbar traction with P-ball  HOME EXERCISE PROGRAM (pt educated on HEP, was provided handout, and verbally confirmed understanding of exercises as appropriate):  Access Code: [email or web address]: [name] Date: 11/02/2021 Prepared by: Kendra BaloExercises Lateral Shift Correction at [place] 8 x daily - 7 x weekly - 2 sets - 10 reps Supine Posterior Pelvic Tilt - 2 x daily - 7 x weekly - 2 sets - 10 reps - 5'' hold Supine Hip Adduction Isometric with Ball - 1 x daily - 7 x weekly - 2 sets - 10 reps - 10'' hold Hooklying Clamshell with Resistance - 1 x daily - 7 x weekly - 3 sets - 10 reps Supine Quad Set on Towel  Roll - 3 x daily - 7 x weekly - 3 sets - 10 reps - 10 hold  ASSESSMENT:   CLINICAL IMPRESSION: Kendra [name] is progressing well with therapy.  Pt reports no increase in baseline pain following therapy.  Today we concentrated on core strengthening and hip strengthening.  Pt continues to progress core and hip mat exercises with decreased baseline back pain.  Unfortunately, she is limited in standing exercises d/t her significant bilateral knee pain.  Pt will continue to benefit from skilled physical therapy to address remaining deficits and achieve listed goals.  Continue per POC.     GOALS:     SHORT TERM GOALS:   STG Name Target Date Goal status  1 Serayah will be >75% HEP compliant to improve carryover between sessions and facilitate independent management of condition   Baseline: No HEP 09/06/2021 MET 1/10    LONG TERM GOALS:    LTG Name Target Date Goal status  1 Dallie will improve FOTO score from 50 (baseline) to 68 as a proxy for functional improvement 10/26/2021: 40% 12/14/21 INITIAL  2 Raelene will improve 30'' STS (MCID 2) to >/= 10x (w/ UE?: N) to show improved LE strength and improved transfers    Baseline: 7x  w/ UE? Y 12/14/21 INITIAL  3 Serria will improve 10 meter max gait speed to .9 m/s (.1 m/s MCID) to show functional improvement in ambulation    Baseline: .71 m/s 12/14/21 INITIAL  4 Marielys will be able to stand for 30 min while making a meal, not limited by pain    Baseline: 5 min 12/14/21 INITIAL  5 Venissa will be able to walk for 30 min, not limited by pain    Baseline: 5 min 12/14/21 INITIAL    PLAN: PT FREQUENCY: 1-2x/week   PT DURATION: 8 weeks (Ending 12/14/21)   PLANNED INTERVENTIONS: Therapeutic exercises, Therapeutic activity, Neuro Muscular re-education, Gait training, Patient/Family education, Joint mobilization, Dry Needling, Electrical stimulation, Spinal mobilization and/or manipulation, Moist heat, Taping, Vasopneumatic device, Ionotophoresis 37m/ml  Dexamethasone, and Manual therapy   PLAN FOR NEXT SESSION: assess lateral shift directional preference effect, progressive core and hip strengthening, hip flexor stretching, PPT, dural glides, palloff press    KKevan NyReinhartsen 11/02/2021, 12:11 PM

## 2021-11-02 NOTE — Progress Notes (Signed)
?  Subjective:  ?Patient ID: Kendra [name], female    DOB: [birth year],  MRN: [id number] ? ?Chief Complaint  ?Patient presents with  ? Nail Problem  ?  Nail trim   ? ?56 y.o. female returns for the above complaint.  Patient presents with thickened elongated dystrophic toenails x10.  Pain on palpation.  Patient would like to have the debrided out she is not able to do it herself.  She has secondary complaint left ankle pain hurts with ambulation she has got injection in the past by Dr. Amalia Hailey which seems to help.  She would like to know if she can do another injection.  It has given her months of relief.  She denies any other acute complaints ? ?Objective:  ?There were no vitals filed for this visit. ?Podiatric Exam: ?Vascular: dorsalis pedis and posterior tibial pulses are palpable bilateral. Capillary return is immediate. Temperature gradient is WNL. Skin turgor WNL  ?Sensorium: Normal Semmes Weinstein monofilament test. Normal tactile sensation bilaterally. ?Nail Exam: Pt has thick disfigured discolored nails with subungual debris noted bilateral entire nail hallux through fifth toenails.  Pain on palpation to the nails. ?Ulcer Exam: There is no evidence of ulcer or pre-ulcerative changes or infection. ?Orthopedic Exam: Muscle tone and strength are WNL. No limitations in general ROM. No crepitus or effusions noted.  Pain on palpation to the left ankle joint pain with dorsiflexion of the ankle joint.  Deep intra-articular pain noted ?Skin: No Porokeratosis. No infection or ulcers ? ? ? ?Assessment & Plan:  ? ?1. Capsulitis of ankle, left   ?2. Pain due to onychomycosis of toenails of both feet   ? ? ?Patient was evaluated and treated and all questions answered. ? ?Left ankle joint capsulitis ?-I explained to the patient the etiology of capsulitis and various treatment options were extensively discussed.  Given the amount of pain that she is having she will benefit from steroid injection.  Patient states understand  like to proceed with steroid injection ?-A steroid injection was performed at left ankle joint using 1% plain Lidocaine and 10 mg of Kenalog. This was well tolerated. ? ? ?Onychomycosis with pain  ?-Nails palliatively debrided as below. ?-Educated on self-care ? ?Procedure: Nail Debridement ?Rationale: pain  ?Type of Debridement: manual, sharp debridement. ?Instrumentation: Nail nipper, rotary burr. ?Number of Nails: 10 ? ?Procedures and Treatment: Consent by patient was obtained for treatment procedures. The patient understood the discussion of treatment and procedures well. All questions were answered thoroughly reviewed. Debridement of mycotic and hypertrophic toenails, 1 through 5 bilateral and clearing of subungual debris. No ulceration, no infection noted.  ?Return Visit-Office Procedure: Patient instructed to return to the office for a follow up visit 3 months for continued evaluation and treatment. ? ?Boneta Lucks, DPM ?  ? ?No follow-ups on file. ? ?

## 2021-11-07 ENCOUNTER — Ambulatory Visit: Payer: Medicare Other | Admitting: Physical Therapy

## 2021-11-08 ENCOUNTER — Ambulatory Visit (INDEPENDENT_AMBULATORY_CARE_PROVIDER_SITE_OTHER): Payer: Medicare Other | Admitting: Family Medicine

## 2021-11-09 ENCOUNTER — Encounter: Payer: Self-pay | Admitting: Physical Therapy

## 2021-11-09 ENCOUNTER — Ambulatory Visit: Payer: Medicare Other | Admitting: Physical Therapy

## 2021-11-09 ENCOUNTER — Other Ambulatory Visit: Payer: Self-pay

## 2021-11-09 DIAGNOSIS — M545 Low back pain, unspecified: Secondary | ICD-10-CM | POA: Diagnosis not present

## 2021-11-09 DIAGNOSIS — R2689 Other abnormalities of gait and mobility: Secondary | ICD-10-CM | POA: Diagnosis not present

## 2021-11-09 DIAGNOSIS — M6281 Muscle weakness (generalized): Secondary | ICD-10-CM | POA: Diagnosis not present

## 2021-11-09 NOTE — Therapy (Signed)
?OUTPATIENT PHYSICAL THERAPY TREATMENT NOTE ? ? ?Patient Name: JENAN ELLEGOOD ?MRN: [id number] ?DOB:May 25, 1966, 56 y.o., female ?Today's Date: 11/09/2021 ? ?PCP: Eppie Gibson, MD ?REFERRING PROVIDER: Lind Covert, * ? ? PT End of Session - 11/09/21 1215   ? ? Visit Number 8   ? Number of Visits 16   ? Date for PT Re-Evaluation 12/14/21   ? Authorization Type UHC MCR - FOTO   ? Progress Note Due on Visit 10   ? PT Start Time 1215   ? PT Stop Time 1258   ? PT Time Calculation (min) 43 min   ? Activity Tolerance Patient tolerated treatment well   ? ?  ?  ? ?  ? ? ? ?Past Medical History:  ?Diagnosis Date  ? Anemia   ? Asthma   ? Esophagitis   ? GERD (gastroesophageal reflux disease)   ? HTN (hypertension)   ? Hyperplastic colon polyp   ? 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 Cor CTA 05/2019: Ca score 81.5 (96th percentile); mRCA < 25; dLM < 25; oLAD 25-49, pLAD < 25  ? ?Past Surgical History:  ?Procedure Laterality Date  ? ABDOMINAL HYSTERECTOMY  04/12/15  ? ANKLE SURGERY Left   ? CESAREAN SECTION    ? CYSTOSCOPY N/A 04/12/2015  ? Procedure: CYSTOSCOPY;  Surgeon: Lavonia Drafts, MD;  Location: [facility];  Service: Gynecology;  Laterality: N/A;  ? FINGER SURGERY    ? TIBIA FRACTURE SURGERY    ? TUBAL LIGATION    ? ?Patient Active Problem List  ? Diagnosis Date Noted  ? Bilateral leg pain 12/26/2020  ? 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 Headache 09/25/2018  ? Hyperlipidemia 06/18/2018  ? Swelling of lower extremity 02/15/2016  ? Hemangioma of liver 07/07/2015  ? Tendinopathy of right rotator cuff 08/24/2014  ? DOE (dyspnea on exertion) 06/01/2013  ? Cervical neck pain with evidence of disc disease 05/14/2012  ? Osteoarthrosis, unspecified whether generalized or localized, involving lower leg 05/14/2012  ? Chronic low back pain 12/20/2010  ? Morbid obesity ([place]) 04/07/2008  ? TOBACCO ABUSE 04/07/2008  ? Gastroesophageal reflux disease 10/24/2006  ? ? ?REFERRING DIAG: Referral  diagnosis: Chronic midline low back pain with sciatica, sciatica laterality unspecified [M54.40, G89.29] ? ?THERAPY DIAG:  ?Low back pain, unspecified back pain laterality, unspecified chronicity, unspecified whether sciatica present ? ?Muscle weakness ? ?Other abnormalities of gait and mobility ? ?PERTINENT HISTORY: Bil knee pain ? ?PRECAUTIONS/RESTRICTIONS:  ? ?PRECAUTIONS: None ?  ?WEIGHT BEARING RESTRICTIONS No ? ?SUBJECTIVE:  ?Pt reports that her back is doing well.  Her knees continue to hurt quite a bit. ? ?Pain:  ?Are you having pain? Yes ?Pain location: R sided back pain with radiation to bil knees ?NPRS scale: 2/10 ?Aggravating factors: Walking, laying down ?Relieving factors: "nothing" tylenol ?Pain description: constant, sharp, burning, and dull ?Severity: high ?Irritability: moderate/high ?Stage: Chronic ?24 hour pattern: no clear pattern  ? ?OBJECTIVE: ? ?LUMBAR AROM ?  ?R iliac crest is elevated ?  ?AROM AROM  ?08/16/2021  ?Flexion WNL, w/ concordant pain  ?Extension WNL, w/ concordant pain  ?Right lateral flexion limited by 25%, w/ concordant pain  ?Left lateral flexion limited by 25%, w/ concordant pain  ?Right rotation limited by 25%, w/ concordant pain  ?Left rotation limited by 25%, w/ concordant pain  ? (Blank rows = not tested) ?  ?  ?LE MMT: ?  ?MMT Right ?08/16/2021 Left ?08/16/2021  ?Hip flexion (L2,  L3)      ?Knee extension (L3)      ?Knee flexion      ?Hip abduction      ?Hip extension      ?Hip external rotation      ?Hip internal rotation      ?Hip adduction      ?Ankle dorsiflexion (L4)      ?Ankle plantarflexion (S1)      ?Ankle inversion      ?Ankle eversion      ?Great Toe ext (L5)      ?  ? (Blank rows = not tested, score listed is out of 5 possible points.  N = WNL, D = diminished, * = concordant pain with testing) ? ?FOTO: ?08/16/21: 50% ?10/26/2021: 40% ? ? ?TREATMENT: ? ?Treatment: 11/09/2021 ?Therapeutic Exercise: ?- nu-step L6 83m (seat 10) while taking subjective and planning  session with patient ?- L lateral shift correction: 20x ?- LTR - 20x ?- L sided sciatic nerve glide - 2x20 ?- hip adduction with pilates ring + bridge 3x10 ?- bridge + black TB ER - 3x10 partial ROM ?- dead bug dead but - 3x10 ?- alternating SLR from foam roller - 3x10 ea - 2# ?- seated lumbar ext at cable machine - 3x10 @ 23# ?- SAQ - 4# - 3x10 ea ?- STS from table - 3x10 (NT) ? ?Treatment: 11/02/2021 ?Therapeutic Exercise: ?- nu-step L6 20m while taking subjective and planning session with patient ?- L lateral shift correction: 20x ?- LTR - 20x ?- L sided sciatic nerve glide - 2x20 ?- hip adduction with pilates ring 2x10 ?- bridge + black TB ER - 3x10 ?- Isometric abdominal squeeze - 2x10 3'' hold - Pball ?- alternating SLR from foam roller - 2x10 ea - 2# ?- quad set - 2x10 ?- STS from table - 3x10 ? ?[facility] Adult PT Treatment:                                                DATE: 10/26/2021 ?Therapeutic Exercise: ?Nustep level 5 x5 min while gathering subjective ?PPT 5" hold 2 x 10 ?PPT with hip adduction ball squeeze 5" hold 2 x 10 ?PPT with march 2 x 10 ?Bridge 3" hold at top 3 x 10 ?LTR 2 x 10 ?Hooklying clamshell BlackTB 3 x 10 ?STS 2 x 10 ?SLR 2 x 10 BIL ?Cybex hip abduction 12.5# 2 x 10 BIL ?Cybex hip extension 12.5# 2 x 10 BIL ? ? ?Therapeutic Exercise: ?- nu-step L6 58m while taking subjective and planning session with patient ?- L lateral shift correction: 20x ?- LTR - 20x ?- L sided sciatic nerve glide - 2x20 ?- Hip flexor stretch - contralateral LE on bolster - x3 (NT) ?- hip adduction squeeze 5'' 2x10 ?- alternating clam shell - black TB - 3x10 ea ?- Isometric abdominal squeeze - 3x10 ?- PPT in HL - 2x10 5'' hold ?- supine bridge - 4x10 ?- alternating SLR from foam roller - 2x10 ea ?- STS from table - 3x10 ? ?Manual Therapy: ?- gentle manual lumbar traction with P-ball ? ?HOME EXERCISE PROGRAM (pt educated on HEP, was provided handout, and verbally confirmed understanding of exercises as  appropriate): ? ?Access Code: [email or web address] ?URL: [email or web address] ?Date: 11/09/2021 ?Prepared by: Shearon Balo ? ?Exercises ?Lateral Shift Correction at Wall - 8 x daily - 7 x weekly -  2 sets - 10 reps ?Supine Posterior Pelvic Tilt - 2 x daily - 7 x weekly - 2 sets - 10 reps - 5'' hold ?Supine Hip Adduction Isometric with Ball - 1 x daily - 7 x weekly - 2 sets - 10 reps - 10'' hold ?Hooklying Clamshell with Resistance - 1 x daily - 7 x weekly - 3 sets - 10 reps ?Supine Quad Set on Towel Roll - 3 x daily - 7 x weekly - 3 sets - 10 reps - 10 hold ?Supine Knee Extension Strengthening - 1 x daily - 7 x weekly - 3 sets - 10 reps ? ? ? ? ?  ?  ?ASSESSMENT: ?  ?CLINICAL IMPRESSION: ?Eiley is progressing well with therapy.  Pt reports no increase in baseline pain following therapy.  Today we concentrated on core strengthening and hip strengthening.  Pt progressing with mat exercises as expected with lower baseline pain.  Added in resisted lumbar ext with cable machine to good effect today.  Pt will continue to benefit from skilled physical therapy to address remaining deficits and achieve listed goals.  Continue per POC. ? ?   ?GOALS: ?  ?  ?SHORT TERM GOALS: ?  ?STG Name Target Date Goal status  ?1 Aria will be >75% HEP compliant to improve carryover between sessions and facilitate independent management of condition ?  ?Baseline: No HEP 09/06/2021 MET 1/10  ?  ?LONG TERM GOALS:  ?  ?LTG Name Target Date Goal status  ?1 Kitara will improve FOTO score from 50 (baseline) to 68 as a proxy for functional improvement ?10/26/2021: 40% 12/14/21 INITIAL  ?2 Kayelee will improve 30'' STS (MCID 2) to >/= 10x (w/ UE?: N) to show improved LE strength and improved transfers  ?  ?Baseline: 7x  w/ UE? Y 12/14/21 INITIAL  ?3 Matina will improve 10 meter max gait speed to .9 m/s (.1 m/s MCID) to show functional improvement in ambulation  ?  ?Baseline: .71 m/s 12/14/21 INITIAL  ?4 Maalle will be able to stand for 30 min while  making a meal, not limited by pain  ?  ?Baseline: 5 min 12/14/21 INITIAL  ?5 Kaiana will be able to walk for 30 min, not limited by pain  ?  ?Baseline: 5 min 12/14/21 INITIAL  ?  ?PLAN: ?PT FREQUENCY: 1-2x/week ?  ?PT DURATION: 8 weeks ([place]

## 2021-11-10 ENCOUNTER — Telehealth: Payer: Self-pay

## 2021-11-10 NOTE — Telephone Encounter (Signed)
Patient calls nurse line regarding weight management. Patient states that she was previously established at Weight management clinic on [address], however, she is unable to schedule there anymore due to missing two appointment.  ? ?Patient is requesting another referral for weight management to a different office.  ? ?Will forward to PCP. Please advise.  ? ?Talbot Grumbling, RN ? ?

## 2021-11-14 ENCOUNTER — Encounter: Payer: Self-pay | Admitting: Physical Therapy

## 2021-11-14 ENCOUNTER — Ambulatory Visit: Payer: Medicare Other | Admitting: Physical Therapy

## 2021-11-14 ENCOUNTER — Other Ambulatory Visit: Payer: Self-pay

## 2021-11-14 DIAGNOSIS — M6281 Muscle weakness (generalized): Secondary | ICD-10-CM | POA: Diagnosis not present

## 2021-11-14 DIAGNOSIS — R2689 Other abnormalities of gait and mobility: Secondary | ICD-10-CM | POA: Diagnosis not present

## 2021-11-14 DIAGNOSIS — M545 Low back pain, unspecified: Secondary | ICD-10-CM

## 2021-11-14 NOTE — Therapy (Signed)
?OUTPATIENT PHYSICAL THERAPY TREATMENT NOTE ? ? ?Patient Name: Kendra [name] ?MRN: [id number] ?DOB:[birth year], 56 y.o., female ?Today's Date: 11/14/2021 ? ?PCP: Kendra Gibson, MD ?REFERRING PROVIDER: Eppie Gibson, MD ? ? PT End of Session - 11/14/21 1208   ? ? Visit Number 9   ? Number of Visits 16   ? Date for PT Re-Evaluation 12/14/21   ? Authorization Type UHC MCR - FOTO   ? Progress Note Due on Visit 10   ? PT Start Time 1210   ? PT Stop Time 1253   ? PT Time Calculation (min) 43 min   ? Activity Tolerance Patient tolerated treatment well   ? ?  ?  ? ?  ? ? ? ?Past Medical History:  ?Diagnosis Date  ? Anemia   ? Asthma   ? Esophagitis   ? GERD (gastroesophageal reflux disease)   ? HTN (hypertension)   ? Hyperplastic colon polyp   ? 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 Cor CTA 05/2019: Ca score 81.5 (96th percentile); mRCA < 25; dLM < 25; oLAD 25-49, pLAD < 25  ? ?Past Surgical History:  ?Procedure Laterality Date  ? ABDOMINAL HYSTERECTOMY  04/12/15  ? ANKLE SURGERY Left   ? CESAREAN SECTION    ? CYSTOSCOPY N/A 04/12/2015  ? Procedure: CYSTOSCOPY;  Surgeon: Lavonia Drafts, MD;  Location: [facility];  Service: Gynecology;  Laterality: N/A;  ? FINGER SURGERY    ? TIBIA FRACTURE SURGERY    ? TUBAL LIGATION    ? ?Patient Active Problem List  ? Diagnosis Date Noted  ? Bilateral leg pain 12/26/2020  ? 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 Headache 09/25/2018  ? Hyperlipidemia 06/18/2018  ? Swelling of lower extremity 02/15/2016  ? Hemangioma of liver 07/07/2015  ? Tendinopathy of right rotator cuff 08/24/2014  ? DOE (dyspnea on exertion) 06/01/2013  ? Cervical neck pain with evidence of disc disease 05/14/2012  ? Osteoarthrosis, unspecified whether generalized or localized, involving lower leg 05/14/2012  ? Chronic low back pain 12/20/2010  ? Morbid obesity ([place]) 04/07/2008  ? TOBACCO ABUSE 04/07/2008  ? Gastroesophageal reflux disease 10/24/2006  ? ? ?REFERRING DIAG: Referral diagnosis:  Chronic midline low back pain with sciatica, sciatica laterality unspecified [M54.40, G89.29] ? ?THERAPY DIAG:  ?Low back pain, unspecified back pain laterality, unspecified chronicity, unspecified whether sciatica present ? ?Muscle weakness ? ?Other abnormalities of gait and mobility ? ?PERTINENT HISTORY: Bil knee pain ? ?PRECAUTIONS/RESTRICTIONS:  ? ?PRECAUTIONS: None ?  ?WEIGHT BEARING RESTRICTIONS No ? ?SUBJECTIVE:  ?Pt reports that her back is improving.  She has a high level of difficulty walking d/t her knee pain ? ?Pain:  ?Are you having pain? Yes ?Pain location: R sided back pain with radiation to bil knees ?NPRS scale: 2/10 ?Aggravating factors: Walking, laying down ?Relieving factors: "nothing" tylenol ?Pain description: constant, sharp, burning, and dull ?Severity: high ?Irritability: moderate/high ?Stage: Chronic ?24 hour pattern: no clear pattern  ? ?OBJECTIVE: ? ?LUMBAR AROM ?  ?R iliac crest is elevated ?  ?AROM AROM  ?08/16/2021  ?Flexion WNL, w/ concordant pain  ?Extension WNL, w/ concordant pain  ?Right lateral flexion limited by 25%, w/ concordant pain  ?Left lateral flexion limited by 25%, w/ concordant pain  ?Right rotation limited by 25%, w/ concordant pain  ?Left rotation limited by 25%, w/ concordant pain  ? (Blank rows = not tested) ?  ?  ?LE MMT: ?  ?MMT Right ?08/16/2021 Left ?08/16/2021  ?  Hip flexion (L2, L3)      ?Knee extension (L3)      ?Knee flexion      ?Hip abduction      ?Hip extension      ?Hip external rotation      ?Hip internal rotation      ?Hip adduction      ?Ankle dorsiflexion (L4)      ?Ankle plantarflexion (S1)      ?Ankle inversion      ?Ankle eversion      ?Great Toe ext (L5)      ?  ? (Blank rows = not tested, score listed is out of 5 possible points.  N = WNL, D = diminished, * = concordant pain with testing) ? ?FOTO: ?08/16/21: 50% ?10/26/2021: 40% ? ?TREATMENT: ? ?Treatment: 11/14/2021 ?Therapeutic Exercise: ?- nu-step L6 39m (seat 10) while taking subjective and  planning session with patient ?- LTR - 20x ?- L sided sciatic nerve glide - 2x20 ?- hip adduction with pilates ring + bridge 3x10 ?- dead bug - 3x10 - with ball ?- seated lumbar ext at cable machine - 3x10 @ 33# ?- SAQ - 10# - 3x10 ea ?- reclined leg press - 2x10 - 60# ? ? ?HOME EXERCISE PROGRAM (pt educated on HEP, was provided handout, and verbally confirmed understanding of exercises as appropriate): ? ?Access Code: [email or web address] ?URL: [email or web address] ?Date: 11/09/2021 ?Prepared by: Shearon Balo ? ?Exercises ?Lateral Shift Correction at Wall - 8 x daily - 7 x weekly - 2 sets - 10 reps ?Supine Posterior Pelvic Tilt - 2 x daily - 7 x weekly - 2 sets - 10 reps - 5'' hold ?Supine Hip Adduction Isometric with Ball - 1 x daily - 7 x weekly - 2 sets - 10 reps - 10'' hold ?Hooklying Clamshell with Resistance - 1 x daily - 7 x weekly - 3 sets - 10 reps ?Supine Quad Set on Towel Roll - 3 x daily - 7 x weekly - 3 sets - 10 reps - 10 hold ?Supine Knee Extension Strengthening - 1 x daily - 7 x weekly - 3 sets - 10 reps ? ? ? ? ?  ?  ?ASSESSMENT: ?  ?CLINICAL IMPRESSION: ?Overall back is progressing nicely.  Progressed intensity of lumbar ext in seating today to good effect.  She remains highly limited by her knee pain with standing. ? ?   ?GOALS: ?  ?  ?SHORT TERM GOALS: ?  ?STG Name Target Date Goal status  ?1 Kendra [name] will be >75% HEP compliant to improve carryover between sessions and facilitate independent management of condition ?  ?Baseline: No HEP 09/06/2021 MET 1/10  ?  ?LONG TERM GOALS:  ?  ?LTG Name Target Date Goal status  ?1 Kendra [name] will improve FOTO score from 50 (baseline) to 68 as a proxy for functional improvement ?10/26/2021: 40% 12/14/21 ongoing  ?2 Kendra [name] will improve 30'' STS (MCID 2) to >/= 10x (w/ UE?: N) to show improved LE strength and improved transfers  ?  ?Baseline: 7x  w/ UE? Y 12/14/21 INITIAL  ?3 Kendra [name] will improve 10 meter max gait speed to .9 m/s (.1 m/s MCID) to show functional  improvement in ambulation  ?  ?Baseline: .71 m/s 12/14/21 INITIAL  ?4 Kendra [name] will be able to stand for 30 min while making a meal, not limited by pain  ?  ?Baseline: 5 min 12/14/21 INITIAL  ?5 Jamy will be able to walk for 30 min, not limited by pain  ?  ?  Baseline: 5 min 12/14/21 INITIAL  ?  ?PLAN: ?PT FREQUENCY: 1-2x/week ?  ?PT DURATION: 8 weeks (Ending 12/14/21) ?  ?PLANNED INTERVENTIONS: Therapeutic exercises, Therapeutic activity, Neuro Muscular re-education, Gait training, Patient/Family education, Joint mobilization, Dry Needling, Electrical stimulation, Spinal mobilization and/or manipulation, Moist heat, Taping, Vasopneumatic device, Ionotophoresis 52m/ml Dexamethasone, and Manual therapy ?  ?PLAN FOR NEXT SESSION: assess lateral shift directional preference effect, progressive core and hip strengthening, hip flexor stretching, PPT, dural glides, palloff press ? ? ? ?KKevan NyReinhartsen ?11/14/2021, 12:54 PM ? ?  ? ?

## 2021-11-16 ENCOUNTER — Ambulatory Visit: Payer: Medicare Other | Admitting: Physical Therapy

## 2021-11-16 ENCOUNTER — Other Ambulatory Visit: Payer: Self-pay

## 2021-11-16 ENCOUNTER — Encounter: Payer: Self-pay | Admitting: Physical Therapy

## 2021-11-16 DIAGNOSIS — R2689 Other abnormalities of gait and mobility: Secondary | ICD-10-CM

## 2021-11-16 DIAGNOSIS — M545 Low back pain, unspecified: Secondary | ICD-10-CM | POA: Diagnosis not present

## 2021-11-16 DIAGNOSIS — M6281 Muscle weakness (generalized): Secondary | ICD-10-CM | POA: Diagnosis not present

## 2021-11-16 NOTE — Therapy (Addendum)
?Progress Note ?Reporting Period 12/21 to 3/23 ? ?See note below for Objective Data and Assessment of Progress/Goals.  ? ? ? ? ? ?Patient Name: Kendra [name] ?MRN: [id number] ?DOB:Apr 20, 1966, 56 y.o., female ?Today's Date: 11/16/2021 ? ?PCP: Kendra Gibson, MD ?REFERRING PROVIDER: Lind [name], * ? ? PT End of Session - 11/16/21 1213   ? ? Visit Number 10   ? Number of Visits 16   ? Date for PT Re-Evaluation 12/14/21   ? Authorization Type UHC MCR - FOTO   ? Progress Note Due on Visit 10   ? PT Start Time 1215   ? PT Stop Time 1258   ? PT Time Calculation (min) 43 min   ? Activity Tolerance Patient tolerated treatment well   ? ?  ?  ? ?  ? ? ? ?Past Medical History:  ?Diagnosis Date  ? Anemia   ? Asthma   ? Esophagitis   ? GERD (gastroesophageal reflux disease)   ? HTN (hypertension)   ? Hyperplastic colon polyp   ? 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 Cor CTA 05/2019: Ca score 81.5 (96th percentile); mRCA < 25; dLM < 25; oLAD 25-49, pLAD < 25  ? ?Past Surgical History:  ?Procedure Laterality Date  ? ABDOMINAL HYSTERECTOMY  04/12/15  ? ANKLE SURGERY Left   ? CESAREAN SECTION    ? CYSTOSCOPY N/A 04/12/2015  ? Procedure: CYSTOSCOPY;  Surgeon: Kendra Drafts, MD;  Location: [facility];  Service: Gynecology;  Laterality: N/A;  ? FINGER SURGERY    ? TIBIA FRACTURE SURGERY    ? TUBAL LIGATION    ? ?Patient Active Problem List  ? Diagnosis Date Noted  ? Bilateral leg pain 12/26/2020  ? 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 Headache 09/25/2018  ? Hyperlipidemia 06/18/2018  ? Swelling of lower extremity 02/15/2016  ? Hemangioma of liver 07/07/2015  ? Tendinopathy of right rotator cuff 08/24/2014  ? DOE (dyspnea on exertion) 06/01/2013  ? Cervical neck pain with evidence of disc disease 05/14/2012  ? Osteoarthrosis, unspecified whether generalized or localized, involving lower leg 05/14/2012  ? Chronic low back pain 12/20/2010  ? Morbid obesity ([place]) 04/07/2008  ? TOBACCO ABUSE 04/07/2008  ?  Gastroesophageal reflux disease 10/24/2006  ? ? ?REFERRING DIAG: Referral diagnosis: Chronic midline low back pain with sciatica, sciatica laterality unspecified [M54.40, G89.29] ? ?THERAPY DIAG:  ?Low back pain, unspecified back pain laterality, unspecified chronicity, unspecified whether sciatica present ? ?Muscle weakness ? ?Other abnormalities of gait and mobility ? ?PERTINENT HISTORY: Bil knee pain ? ?PRECAUTIONS/RESTRICTIONS:  ? ?PRECAUTIONS: None ?  ?WEIGHT BEARING RESTRICTIONS No ? ?SUBJECTIVE:  ?Pt reports that her back is feeling very good today. ? ?Pain:  ?Are you having pain? Yes ?Pain location: R sided back pain with radiation to bil knees ?NPRS scale: 0/10 ?Aggravating factors: Walking, laying down ?Relieving factors: "nothing" tylenol ?Pain description: constant, sharp, burning, and dull ?Severity: high ?Irritability: moderate/high ?Stage: Chronic ?24 hour pattern: no clear pattern  ? ?OBJECTIVE: ? ?LUMBAR AROM ?  ?R iliac crest is elevated ?  ?AROM AROM  ?08/16/2021  ?Flexion WNL, w/ concordant pain  ?Extension WNL, w/ concordant pain  ?Right lateral flexion limited by 25%, w/ concordant pain  ?Left lateral flexion limited by 25%, w/ concordant pain  ?Right rotation limited by 25%, w/ concordant pain  ?Left rotation limited by 25%, w/ concordant pain  ? (Blank rows = not tested) ?  ?  ?LE MMT: ?  ?  MMT Right ?08/16/2021 Left ?08/16/2021  ?Hip flexion (L2, L3)      ?Knee extension (L3)      ?Knee flexion      ?Hip abduction      ?Hip extension      ?Hip external rotation      ?Hip internal rotation      ?Hip adduction      ?Ankle dorsiflexion (L4)      ?Ankle plantarflexion (S1)      ?Ankle inversion      ?Ankle eversion      ?Great Toe ext (L5)      ?  ? (Blank rows = not tested, score listed is out of 5 possible points.  N = WNL, D = diminished, * = concordant pain with testing) ? ?FOTO: ?08/16/21: 50% ?10/26/2021: 40% ?3/23: 41% ? ?TREATMENT: ? ?Treatment: 11/16/2021 ?Therapeutic Exercise: ?- nu-step  L6 44m (seat 10) while taking subjective and planning session with patient ?- LTR - 20x ?- hip adduction with pilates ring + bridge 3x10 ?- dead bug - 3x10 - with ball ?- seated lumbar ext at cable machine - 3x10 @ 40# ?- LAQ - 10# - 3x10 ea ?- reclined leg press (S7) - 2x10 ea (high and low foot plate)- 80# ? ?Therapeutic Activity ?- collecting information for FOTO, checking progress, and reviewing with patient ? ? ?[employer] (pt educated on HEP, was provided handout, and verbally confirmed understanding of exercises as appropriate): ? ?Access Code: [email or web address] ?URL: [email or web address] ?Date: 11/09/2021 ?Prepared by: Kendra [name] ? ?Exercises ?Lateral Shift Correction at Wall - 8 x daily - 7 x weekly - 2 sets - 10 reps ?Supine Posterior Pelvic Tilt - 2 x daily - 7 x weekly - 2 sets - 10 reps - 5'' hold ?Supine Hip Adduction Isometric with Ball - 1 x daily - 7 x weekly - 2 sets - 10 reps - 10'' hold ?Hooklying Clamshell with Resistance - 1 x daily - 7 x weekly - 3 sets - 10 reps ?Supine Quad Set on Towel Roll - 3 x daily - 7 x weekly - 3 sets - 10 reps - 10 hold ?Supine Knee Extension Strengthening - 1 x daily - 7 x weekly - 3 sets - 10 reps ? ? ?  ?  ?ASSESSMENT: ?  ?CLINICAL IMPRESSION: ?Kendra [name] has progressed well with therapy.  Improved impairments include: low back pain; core and hip strength and endurance.  Functional improvements include: ability to stand and walk, not limited by back pain.  Progressions needed include: continued core/hip strength progression.  Barriers to progress include: 10/10 knee pain with standing and walking.  Pt is mostly limited by her knee pain at this point we will plan on D/C next week.  Please see baseline and/or status section in "Goals" for specific progress on short term and long term goals established at evaluation.  I recommend continuation of PT to allow completion of remaining goals and continued functional progression. ? ?   ?GOALS: ?  ?  ?SHORT  TERM GOALS: ?  ?STG Name Target Date Goal status  ?1 Kendra [name] will be >75% HEP compliant to improve carryover between sessions and facilitate independent management of condition ?  ?Baseline: No HEP 09/06/2021 MET 1/10  ?  ?LONG TERM GOALS:  ?  ?LTG Name Target Date Goal status  ?1 [place] will improve FOTO score from 50 (baseline) to 68 as a proxy for functional improvement ?10/26/2021: 40% ?3/23: 41% 12/14/21 ongoing  ?2 [place] will  improve 30'' STS (MCID 2) to >/= 10x (w/ UE?: N) to show improved LE strength and improved transfers  ?  ?Baseline: 7x  w/ UE? Y ? ?3/23: 9x w/ UE? N 12/14/21 ongoing  ?3 Sibel will improve 10 meter max gait speed to .9 m/s (.1 m/s MCID) to show functional improvement in ambulation  ?  ?Baseline: .71 m/s 12/14/21 INITIAL  ?4 Dorthia will be able to stand for 30 min while making a meal, not limited by pain  ?  ?Baseline: 5 min (d/t knee pain, not limited by back pain (was limited by both at eval)) 12/14/21 progressing  ?[place] will be able to walk for 30 min, not limited by pain  ?  ?Baseline: 5 min ? ?3/23: 5 min (d/t knee pain, not limited by back pain (was limited by both at eval)) 12/14/21 progressing  ?  ?PLAN: ?PT FREQUENCY: 1-2x/week ?  ?PT DURATION: 8 weeks (Ending 12/14/21) ?  ?PLANNED INTERVENTIONS: Therapeutic exercises, Therapeutic activity, Neuro Muscular re-education, Gait training, Patient/Family education, Joint mobilization, Dry Needling, Electrical stimulation, Spinal mobilization and/or manipulation, Moist heat, Taping, Vasopneumatic device, Ionotophoresis 36m/ml Dexamethasone, and Manual therapy ?  ?PLAN FOR NEXT SESSION: assess lateral shift directional preference effect, progressive core and hip strengthening, hip flexor stretching, PPT, dural glides, palloff press ? ? ? ?KKevan NyReinhartsen ?11/16/2021, 1:02 PM ? ?  ? ?

## 2021-11-21 ENCOUNTER — Ambulatory Visit: Payer: Medicare Other | Admitting: Physical Therapy

## 2021-11-21 ENCOUNTER — Encounter: Payer: Self-pay | Admitting: Physical Therapy

## 2021-11-21 ENCOUNTER — Other Ambulatory Visit: Payer: Self-pay

## 2021-11-21 DIAGNOSIS — M545 Low back pain, unspecified: Secondary | ICD-10-CM

## 2021-11-21 DIAGNOSIS — M6281 Muscle weakness (generalized): Secondary | ICD-10-CM | POA: Diagnosis not present

## 2021-11-21 DIAGNOSIS — R2689 Other abnormalities of gait and mobility: Secondary | ICD-10-CM

## 2021-11-21 NOTE — Therapy (Signed)
?PT Treatment ? ? ?Patient Name: Kendra [name] ?MRN: [id number] ?DOB:[birth year], 56 y.o., female ?Today's Date: 11/21/2021 ? ?PCP: Eppie Gibson, MD ?REFERRING PROVIDER: Eppie Gibson, MD ? ? PT End of Session - 11/21/21 1214   ? ? Visit Number 11   ? Number of Visits 16   ? Date for PT Re-Evaluation 12/14/21   ? Authorization Type UHC MCR - FOTO   ? Progress Note Due on Visit 10   ? PT Start Time 1215   ? PT Stop Time 1258   ? PT Time Calculation (min) 43 min   ? Activity Tolerance Patient tolerated treatment well   ? ?  ?  ? ?  ? ? ? ?Past Medical History:  ?Diagnosis Date  ? Anemia   ? Asthma   ? Esophagitis   ? GERD (gastroesophageal reflux disease)   ? HTN (hypertension)   ? Hyperplastic colon polyp   ? 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 Cor CTA 05/2019: Ca score 81.5 (96th percentile); mRCA < 25; dLM < 25; oLAD 25-49, pLAD < 25  ? ?Past Surgical History:  ?Procedure Laterality Date  ? ABDOMINAL HYSTERECTOMY  04/12/15  ? ANKLE SURGERY Left   ? CESAREAN SECTION    ? CYSTOSCOPY N/A 04/12/2015  ? Procedure: CYSTOSCOPY;  Surgeon: Lavonia Drafts, MD;  Location: [facility];  Service: Gynecology;  Laterality: N/A;  ? FINGER SURGERY    ? TIBIA FRACTURE SURGERY    ? TUBAL LIGATION    ? ?Patient Active Problem List  ? Diagnosis Date Noted  ? Bilateral leg pain 12/26/2020  ? 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 Headache 09/25/2018  ? Hyperlipidemia 06/18/2018  ? Swelling of lower extremity 02/15/2016  ? Hemangioma of liver 07/07/2015  ? Tendinopathy of right rotator cuff 08/24/2014  ? DOE (dyspnea on exertion) 06/01/2013  ? Cervical neck pain with evidence of disc disease 05/14/2012  ? Osteoarthrosis, unspecified whether generalized or localized, involving lower leg 05/14/2012  ? Chronic low back pain 12/20/2010  ? Morbid obesity ([place]) 04/07/2008  ? TOBACCO ABUSE 04/07/2008  ? Gastroesophageal reflux disease 10/24/2006  ? ? ?REFERRING DIAG: Referral diagnosis: Chronic midline low back pain  with sciatica, sciatica laterality unspecified [M54.40, G89.29] ? ?THERAPY DIAG:  ?Low back pain, unspecified back pain laterality, unspecified chronicity, unspecified whether sciatica present ? ?Muscle weakness ? ?Other abnormalities of gait and mobility ? ?PERTINENT HISTORY: Bil knee pain ? ?PRECAUTIONS/RESTRICTIONS:  ? ?PRECAUTIONS: None ?  ?WEIGHT BEARING RESTRICTIONS No ? ?SUBJECTIVE:  ?Pt reports that her knees are stiff this morning.  Her back is doing well.  ? ?Pain:  ?Are you having pain? Yes ?Pain location: R sided back pain with radiation to bil knees ?NPRS scale: 2/10 ?Aggravating factors: Walking, laying down ?Relieving factors: "nothing" tylenol ?Pain description: constant, sharp, burning, and dull ?Severity: high ?Irritability: moderate/high ?Stage: Chronic ?24 hour pattern: no clear pattern  ? ?OBJECTIVE: ? ?LUMBAR AROM ?  ?R iliac crest is elevated ?  ?AROM AROM  ?08/16/2021  ?Flexion WNL, w/ concordant pain  ?Extension WNL, w/ concordant pain  ?Right lateral flexion limited by 25%, w/ concordant pain  ?Left lateral flexion limited by 25%, w/ concordant pain  ?Right rotation limited by 25%, w/ concordant pain  ?Left rotation limited by 25%, w/ concordant pain  ? (Blank rows = not tested) ?  ?  ?LE MMT: ?  ?MMT Right ?08/16/2021 Left ?08/16/2021  ?Hip flexion (L2, L3)      ?  Knee extension (L3)      ?Knee flexion      ?Hip abduction      ?Hip extension      ?Hip external rotation      ?Hip internal rotation      ?Hip adduction      ?Ankle dorsiflexion (L4)      ?Ankle plantarflexion (S1)      ?Ankle inversion      ?Ankle eversion      ?Great Toe ext (L5)      ?  ? (Blank rows = not tested, score listed is out of 5 possible points.  N = WNL, D = diminished, * = concordant pain with testing) ? ?FOTO: ?08/16/21: 50% ?10/26/2021: 40% ?3/23: 41% ? ?TREATMENT: ? ?Treatment: 11/21/2021 ?Therapeutic Exercise: ?- nu-step L6 74m(seat 10) while taking subjective and planning session with patient ?- LTR - 20x ?- hip  adduction with pilates ring + bridge 3x10 ?- supine clam - Black TB - 3x10 ?- dead bug - 4x14 - with ball ?- seated lumbar ext at cable machine - 3x10 @ 67# ?- knee ext machine  - 20# - 3x10 ea ?- HS curl machine - 25# - 3x10 ea ?- reclined leg press (S7) - 4x10 - 80# ?- hip abduction machine - 17.5# - 2x10 ea ? ? ?HOME EXERCISE PROGRAM (pt educated on HEP, was provided handout, and verbally confirmed understanding of exercises as appropriate): ? ?Access Code: [email or web address]?URL: [email or web address] ?Date: 11/09/2021 ?Prepared by: KShearon Balo? ?Exercises ?Lateral Shift Correction at Wall - 8 x daily - 7 x weekly - 2 sets - 10 reps ?Supine Posterior Pelvic Tilt - 2 x daily - 7 x weekly - 2 sets - 10 reps - 5'' hold ?Supine Hip Adduction Isometric with Ball - 1 x daily - 7 x weekly - 2 sets - 10 reps - 10'' hold ?Hooklying Clamshell with Resistance - 1 x daily - 7 x weekly - 3 sets - 10 reps ?Supine Quad Set on Towel Roll - 3 x daily - 7 x weekly - 3 sets - 10 reps - 10 hold ?Supine Knee Extension Strengthening - 1 x daily - 7 x weekly - 3 sets - 10 reps ? ? ?  ?  ?ASSESSMENT: ?  ?CLINICAL IMPRESSION: ?Kendra [name] doing well concerning her low back pain with pain levels consistently under 2/10.  Her knees are a bigger limiting factor.  She will be making an apt with an ortho doc for consultation.  We will plan on D/C next visit barring any significant change in status. ? ?   ?GOALS: ?  ?  ?SHORT TERM GOALS: ?  ?STG Name Target Date Goal status  ?1 Kendra [name] will be >75% HEP compliant to improve carryover between sessions and facilitate independent management of condition ?  ?Baseline: No HEP 09/06/2021 MET 1/10  ?  ?LONG TERM GOALS:  ?  ?LTG Name Target Date Goal status  ?1 Kendra [name] will improve FOTO score from 50 (baseline) to 68 as a proxy for functional improvement ?10/26/2021: 40% ?3/23: 41% 12/14/21 ongoing  ?2 Kendra [name] will improve 30'' STS (MCID 2) to >/= 10x (w/ UE?: N) to show improved LE strength and  improved transfers  ?  ?Baseline: 7x  w/ UE? Y ? ?3/23: 9x w/ UE? N 12/14/21 ongoing  ?3 Kendra [name] will improve 10 meter max gait speed to .9 m/s (.1 m/s MCID) to show functional improvement in ambulation  ?  ?Baseline: .71 m/s 12/14/21  INITIAL  ?4 Kendra [name] will be able to stand for 30 min while making a meal, not limited by pain  ?  ?Baseline: 5 min (d/t knee pain, not limited by back pain (was limited by both at eval)) 12/14/21 progressing  ?Kendra [name] will be able to walk for 30 min, not limited by pain  ?  ?Baseline: 5 min ? ?3/23: 5 min (d/t knee pain, not limited by back pain (was limited by both at eval)) 12/14/21 progressing  ?  ?PLAN: ?PT FREQUENCY: 1-2x/week ?  ?PT DURATION: 8 weeks (Ending 12/14/21) ?  ?PLANNED INTERVENTIONS: Therapeutic exercises, Therapeutic activity, Neuro Muscular re-education, Gait training, Patient/Family education, Joint mobilization, Dry Needling, Electrical stimulation, Spinal mobilization and/or manipulation, Moist heat, Taping, Vasopneumatic device, Ionotophoresis 16m/ml Dexamethasone, and Manual therapy ?  ?PLAN FOR NEXT SESSION: assess lateral shift directional preference effect, progressive core and hip strengthening, hip flexor stretching, PPT, dural glides, palloff press ? ? ? ?KKevan NyReinhartsen ?11/21/2021, 12:59 PM ? ?  ? ?

## 2021-11-22 ENCOUNTER — Ambulatory Visit: Payer: Medicare Other

## 2021-11-23 ENCOUNTER — Ambulatory Visit: Payer: Medicare Other | Admitting: Physical Therapy

## 2021-11-23 ENCOUNTER — Encounter: Payer: Self-pay | Admitting: Physical Therapy

## 2021-11-23 DIAGNOSIS — M6281 Muscle weakness (generalized): Secondary | ICD-10-CM

## 2021-11-23 DIAGNOSIS — R2689 Other abnormalities of gait and mobility: Secondary | ICD-10-CM | POA: Diagnosis not present

## 2021-11-23 DIAGNOSIS — M545 Low back pain, unspecified: Secondary | ICD-10-CM | POA: Diagnosis not present

## 2021-11-23 NOTE — Therapy (Signed)
?PHYSICAL THERAPY DISCHARGE SUMMARY ? ?Visits from Start of Care: 12 ? ?Current functional level related to goals / functional outcomes: ?See assessment/goals ?  ?Remaining deficits: ?See assessment/goals ?  ?Education / Equipment: ?HEP and D/C plans ? ?Patient agrees to discharge. Patient goals were partially met. Patient is being discharged due to being pleased with the current functional level. ? ? ?Patient Name: Kendra [name] ?MRN: [id number] ?DOB:[birth year], 56 y.o., female ?Today's Date: 11/23/2021 ? ?PCP: Kendra Gibson, MD ?REFERRING PROVIDER: Lind [name], * ? ? PT End of Session - 11/23/21 1231   ? ? Visit Number 12   ? Number of Visits 16   ? Date for PT Re-Evaluation 12/14/21   ? Authorization Type UHC MCR - FOTO   ? Progress Note Due on Visit 10   ? PT Start Time 1230   ? PT Stop Time 1308   ? PT Time Calculation (min) 38 min   ? Activity Tolerance Patient tolerated treatment well   ? ?  ?  ? ?  ? ? ? ?Past Medical History:  ?Diagnosis Date  ? Anemia   ? Asthma   ? Esophagitis   ? GERD (gastroesophageal reflux disease)   ? HTN (hypertension)   ? Hyperplastic colon polyp   ? 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 Cor CTA 05/2019: Ca score 81.5 (96th percentile); mRCA < 25; dLM < 25; oLAD 25-49, pLAD < 25  ? ?Past Surgical History:  ?Procedure Laterality Date  ? ABDOMINAL HYSTERECTOMY  04/12/15  ? ANKLE SURGERY Left   ? CESAREAN SECTION    ? CYSTOSCOPY N/A 04/12/2015  ? Procedure: CYSTOSCOPY;  Surgeon: Lavonia Drafts, MD;  Location: [facility];  Service: Gynecology;  Laterality: N/A;  ? FINGER SURGERY    ? TIBIA FRACTURE SURGERY    ? TUBAL LIGATION    ? ?Patient Active Problem List  ? Diagnosis Date Noted  ? Bilateral leg pain 12/26/2020  ? 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 Headache 09/25/2018  ? Hyperlipidemia 06/18/2018  ? Swelling of lower extremity 02/15/2016  ? Hemangioma of liver 07/07/2015  ? Tendinopathy of right rotator cuff 08/24/2014  ? DOE (dyspnea on  exertion) 06/01/2013  ? Cervical neck pain with evidence of disc disease 05/14/2012  ? Osteoarthrosis, unspecified whether generalized or localized, involving lower leg 05/14/2012  ? Chronic low back pain 12/20/2010  ? Morbid obesity ([place]) 04/07/2008  ? TOBACCO ABUSE 04/07/2008  ? Gastroesophageal reflux disease 10/24/2006  ? ? ?REFERRING DIAG: Referral diagnosis: Chronic midline low back pain with sciatica, sciatica laterality unspecified [M54.40, G89.29] ? ?THERAPY DIAG:  ?Low back pain, unspecified back pain laterality, unspecified chronicity, unspecified whether sciatica present ? ?Muscle weakness ? ?Other abnormalities of gait and mobility ? ?PERTINENT HISTORY: Bil knee pain ? ?PRECAUTIONS/RESTRICTIONS:  ? ?PRECAUTIONS: None ?  ?WEIGHT BEARING RESTRICTIONS No ? ?SUBJECTIVE:  ?Pt reports that her back is doing well.  Her knees are the most limiting thing at this point.  She is ready for D/C at this time. ? ?Pain:  ?Are you having pain? Yes ?Pain location: R sided back pain with radiation to bil knees ?NPRS scale: 2/10 ?Aggravating factors: Walking, laying down ?Relieving factors: "nothing" tylenol ?Pain description: constant, sharp, burning, and dull ?Severity: high ?Irritability: moderate/high ?Stage: Chronic ?24 hour pattern: no clear pattern  ? ?OBJECTIVE: ? ?LUMBAR AROM ?  ?R iliac crest is elevated ?  ?AROM AROM  ?08/16/2021  ?Flexion WNL, w/ concordant pain  ?  Extension WNL, w/ concordant pain  ?Right lateral flexion limited by 25%, w/ concordant pain  ?Left lateral flexion limited by 25%, w/ concordant pain  ?Right rotation limited by 25%, w/ concordant pain  ?Left rotation limited by 25%, w/ concordant pain  ? (Blank rows = not tested) ?  ?  ?LE MMT: ?  ?MMT Right ?08/16/2021 Left ?08/16/2021  ?Hip flexion (L2, L3)      ?Knee extension (L3)      ?Knee flexion      ?Hip abduction      ?Hip extension      ?Hip external rotation      ?Hip internal rotation      ?Hip adduction      ?Ankle dorsiflexion (L4)       ?Ankle plantarflexion (S1)      ?Ankle inversion      ?Ankle eversion      ?Great Toe ext (L5)      ?  ? (Blank rows = not tested, score listed is out of 5 possible points.  N = WNL, D = diminished, * = concordant pain with testing) ? ?FOTO: ?08/16/21: 50% ?10/26/2021: 40% ?3/23: 41% ?3/30: 45% ? ?TREATMENT: ? ?Treatment: 11/23/2021 ?Therapeutic Exercise: ?- nu-step L6 51m(seat 10) while taking subjective and planning session with patient ?- LTR - 20x ?- hip adduction with pilates ring + bridge 3x10 ?- supine clam - Black TB - 3x10 ?- dead bug - 4x14 - with ball (NT) ?- seated lumbar ext at cable machine - 3x10 @ 70# ?- knee ext machine  - 25# - 3x10 ea ?- HS curl machine - 30# - 3x10 ea ?- reclined leg press (S7) - 4x10 - 80# ? ?Therapeutic Activity ?- collecting information for goals, checking progress, and reviewing with patient ? ? ?HBuffalo(pt educated on HEP, was provided handout, and verbally confirmed understanding of exercises as appropriate): ? ?Access Code: [email or web address]?URL: [email or web address] ?Date: 11/09/2021 ?Prepared by: KShearon Balo? ?Exercises ?Lateral Shift Correction at Wall - 8 x daily - 7 x weekly - 2 sets - 10 reps ?Supine Posterior Pelvic Tilt - 2 x daily - 7 x weekly - 2 sets - 10 reps - 5'' hold ?Supine Hip Adduction Isometric with Ball - 1 x daily - 7 x weekly - 2 sets - 10 reps - 10'' hold ?Hooklying Clamshell with Resistance - 1 x daily - 7 x weekly - 3 sets - 10 reps ?Supine Quad Set on Towel Roll - 3 x daily - 7 x weekly - 3 sets - 10 reps - 10 hold ?Supine Knee Extension Strengthening - 1 x daily - 7 x weekly - 3 sets - 10 reps ? ? ?  ?  ?ASSESSMENT: ?  ?CLINICAL IMPRESSION: ?Kendra GANCIhas progressed fair with therapy.  Improved impairments include: core and LE strength (minimal progress since last progress note).  Functional improvements include: walking, standing, and lifting ability (also limited by knee pain)(minimal progress since last  progress note).  Progressions needed include: continued work at home with HEP.  Barriers to progress include: very high levels of bil knee pain.  [name] states that the exercises have been very helpful for her back but she is still limited in longer duration standing and walking activities.  He knee pain contributes to this limitation as well.  Please see GOALS section for progress on short term and long term goals established at evaluation.  I recommend D/C home with HEP; pt agrees  with plan. ? ?   ?GOALS: ?  ?  ?SHORT TERM GOALS: ?  ?STG Name Target Date Goal status  ?1 Kendra [name] will be >75% HEP compliant to improve carryover between sessions and facilitate independent management of condition ?  ?Baseline: No HEP 09/06/2021 MET 1/10  ?  ?LONG TERM GOALS:  ?  ?LTG Name Target Date Goal status  ?1 Kendra [name] will improve FOTO score from 50 (baseline) to 68 as a proxy for functional improvement ?10/26/2021: 40% ?3/23: 41% ?3/30: 44.7% 12/14/21 Not met  ?2 Kendra [name] will improve 30'' STS (MCID 2) to >/= 10x (w/ UE?: N) to show improved LE strength and improved transfers  ?  ?Baseline: 7x  w/ UE? Y ? ?3/23: 9x w/ UE? N ?3/30: 9x w/ UE? N 12/14/21 Partially met  ?3 Kendra [name] will improve 10 meter max gait speed to .9 m/s (.1 m/s MCID) to show functional improvement in ambulation  ?  ?Baseline: .71 m/s ? ?3/30: .71 m/s limited by knees, not back 12/14/21 not met  ?Kendra [name] will be able to stand for 30 min while making a meal, not limited by pain  ?  ?Baseline: 5 min (d/t knee pain, not limited by back pain (was limited by both at eval)) ? ?3/30: 10 min 12/14/21 Partially met  ?Kendra [name] will be able to walk for 30 min, not limited by pain  ?  ?Baseline: 5 min ? ?3/23: 5 min (d/t knee pain, not limited by back pain (was limited by both at eval)) ? ?3/30: 10 min 12/14/21 Partially met  ?  ?PLAN: ?PT FREQUENCY: 1-2x/week ?  ?PT DURATION: 8 weeks (Ending 12/14/21) ?  ?PLANNED INTERVENTIONS: Therapeutic exercises, Therapeutic activity, Neuro Muscular  re-education, Gait training, Patient/Family education, Joint mobilization, Dry Needling, Electrical stimulation, Spinal mobilization and/or manipulation, Moist heat, Taping, Vasopneumatic device, Ionotophoresis 68m/m

## 2021-11-24 ENCOUNTER — Ambulatory Visit: Payer: Medicare Other

## 2021-12-28 ENCOUNTER — Encounter: Payer: Self-pay | Admitting: Student

## 2021-12-28 ENCOUNTER — Ambulatory Visit (INDEPENDENT_AMBULATORY_CARE_PROVIDER_SITE_OTHER): Payer: Medicare Other | Admitting: Student

## 2021-12-28 VITALS — BP 114/66 | HR 88 | Wt 311.0 lb

## 2021-12-28 DIAGNOSIS — F17200 Nicotine dependence, unspecified, uncomplicated: Secondary | ICD-10-CM

## 2021-12-28 DIAGNOSIS — M170 Bilateral primary osteoarthritis of knee: Secondary | ICD-10-CM | POA: Diagnosis not present

## 2021-12-28 DIAGNOSIS — Z131 Encounter for screening for diabetes mellitus: Secondary | ICD-10-CM | POA: Diagnosis not present

## 2021-12-28 DIAGNOSIS — R7303 Prediabetes: Secondary | ICD-10-CM | POA: Diagnosis not present

## 2021-12-28 LAB — POCT GLYCOSYLATED HEMOGLOBIN (HGB A1C): Hemoglobin A1C: 5.8 % — AB (ref 4.0–5.6)

## 2021-12-28 MED ORDER — SEMAGLUTIDE(0.25 OR 0.5MG/DOS) 2 MG/1.5ML ~~LOC~~ SOPN: 0.2500 mg | PEN_INJECTOR | SUBCUTANEOUS | 0 refills | Status: DC

## 2021-12-28 MED ORDER — VARENICLINE TARTRATE 1 MG PO TABS: 1.0000 mg | ORAL_TABLET | Freq: Two times a day (BID) | ORAL | 3 refills | Status: DC

## 2021-12-28 NOTE — Assessment & Plan Note (Signed)
Weight is up 7 pounds today to 311 pounds from 304 at her last visit.  Patient is unable to fully engage in lifestyle interventions secondary to her knee pain.  Given that she is now also prediabetic, would greatly benefit from the addition of a GLP-1 agonist.  Has experience with autoinjectors with her Repatha and is comfortable with moving forward. ?-We will initiate semaglutide 0.25 mg once weekly ?-Follow-up in 4 weeks ?

## 2021-12-28 NOTE — Assessment & Plan Note (Signed)
Longstanding and advanced issue.  Likely has reached the point now that she will require bilateral total knee arthroplasty.  Has tried and failed more conservative measures including physical therapy and intra-articular corticosteroid injections. ?-Referral to Dr. Ninfa Linden, [facility], per patient request for potential arthroplasty ?

## 2021-12-28 NOTE — Assessment & Plan Note (Signed)
Currently using cigarettes as a coping mechanism to deal with her arthritic pain.  Motivated to quit, however, and has a good therapeutic relationship with Dr. Valentina Lucks. ?-Refilled Chantix ?-Scheduled with Dr. Valentina Lucks for follow-up ?

## 2021-12-28 NOTE — Progress Notes (Signed)
? ? ?SUBJECTIVE:  ? ?CHIEF COMPLAINT / HPI:  ? ?Advanced Arthritis of the Bilateral Knees ?Patient has previously been seen by our office, sports medicine, and orthopedic surgery for this longstanding issue.  She was previously getting regular corticosteroid injections but found that these were decreasingly beneficial for her.  Her knee pain has advanced to the point now that she is unable to engage in activities that she previously enjoyed such as cooking.  She recently completed a course of physical therapy for low back pain and while she found it helpful for her back, her knee pain had advanced to the point that she was unable to fully participate in PT. ?She has been advised on the benefits of weight loss for both treating her OA and for general health but has struggled to lose weight given her relative inactivity in the setting of advanced knee pain bilaterally.  She previously was referred to healthy weight and wellness but ultimately did not follow there due to [facility] issues. ?Though she was seen at [facility] initially, her physical therapist recommended Dr. Ninfa Linden at [facility] and she is requesting a referral to Dr. Ninfa Linden to potentially do her knee replacement in the future. ? ?Tobacco Abuse ?Patient currently smoking 1 pack/day.  Had previously seen Dr. Valentina Lucks in December and was started on Chantix which she reports was quite helpful and she was able to cut back for a little while.  She reports that she has fallen back into smoking 1 pack/day secondary to the stress of her right knee pain and that she is using smoking as a coping mechanism but remains motivated ultimately to quit.  She is requesting that she restart Chantix and be plugged back in with Dr. Valentina Lucks. ? ?Prediabetes  BMI > 50 ?A1c 5.8% today.  Patient aware of diet and lifestyle intervention needs but expresses concern for her ability to pursue especially the physical activity recommendations given severe  limitations with her knee pain.  Also having issues even cooking fresh foods as she is unable to stand for long periods of time. ? ? ?OBJECTIVE:  ? ?BP 114/66   Pulse 88   Wt (!) 311 lb (141.1 kg)   LMP 02/21/2015   BMI 53.38 kg/m?   ?Physical Exam ?Vitals reviewed.  ?Constitutional:   ?   General: She is not in acute distress. ?   Appearance: She is obese.  ?Cardiovascular:  ?   Rate and Rhythm: Normal rate and regular rhythm.  ?Pulmonary:  ?   Effort: Pulmonary effort is normal.  ?Musculoskeletal:  ?   Comments: Bilateral knees without obvious deformity or effusion, tenderness along medial joint line bilaterally. ROM 0-100 degrees bilaterally, though with pain throughout arc of motion.   ? ? ? ?ASSESSMENT/PLAN:  ? ?Primary osteoarthritis of both knees ?Longstanding and advanced issue.  Likely has reached the point now that she will require bilateral total knee arthroplasty.  Has tried and failed more conservative measures including physical therapy and intra-articular corticosteroid injections. ?-Referral to Dr. Ninfa Linden, [facility], per patient request for potential arthroplasty ? ?Morbid obesity ([place]) ?Weight is up 7 pounds today to 311 pounds from 304 at her last visit.  Patient is unable to fully engage in lifestyle interventions secondary to her knee pain.  Given that she is now also prediabetic, would greatly benefit from the addition of a GLP-1 agonist.  Has experience with autoinjectors with her Repatha and is comfortable with moving forward. ?-We will initiate semaglutide 0.25 mg once weekly ?-  Follow-up in 4 weeks ? ?TOBACCO ABUSE ?Currently using cigarettes as a coping mechanism to deal with her arthritic pain.  Motivated to quit, however, and has a good therapeutic relationship with Dr. Valentina Lucks. ?-Refilled Chantix ?-Scheduled with Dr. Valentina Lucks for follow-up ?  ?Pearla Dubonnet, MD ?[place]  ?

## 2021-12-28 NOTE — Patient Instructions (Addendum)
Kendra [name], ? ?It is so good to see you, it is a pleasure being your PCP!  ?I am starting you on a medicine called semaglutide which is a once weekly injection for your blood sugar and for your weight. This is not a magic bullet, but is meant to support you in your lifestyle changes in helping to treat your prediabetes and obesity.  ?I am sending a referral to Dr. Ninfa Linden for knee replacement. If you have not heard from him in 3 weeks, call our office to follow up. ?I will see you in four weeks. ?In the meantime, I have scheduled you with Dr. Valentina Lucks to follow up on tobacco cessation, and have refilled your Chantix.  ? ?Kendra Dubonnet, MD ? ?

## 2022-01-11 ENCOUNTER — Ambulatory Visit: Payer: Medicare Other | Admitting: Pharmacist

## 2022-01-12 ENCOUNTER — Ambulatory Visit
Admission: RE | Admit: 2022-01-12 | Discharge: 2022-01-12 | Disposition: A | Discharge status: Home / Self Care | Payer: Medicare Other | Source: Ambulatory Visit | Attending: Family Medicine | Admitting: Family Medicine

## 2022-01-12 DIAGNOSIS — Z1231 Encounter for screening mammogram for malignant neoplasm of breast: Secondary | ICD-10-CM

## 2022-01-18 ENCOUNTER — Other Ambulatory Visit: Payer: Self-pay | Admitting: Student

## 2022-01-29 ENCOUNTER — Ambulatory Visit: Payer: Self-pay

## 2022-01-29 ENCOUNTER — Ambulatory Visit (INDEPENDENT_AMBULATORY_CARE_PROVIDER_SITE_OTHER): Payer: Medicare Other

## 2022-01-29 ENCOUNTER — Ambulatory Visit (INDEPENDENT_AMBULATORY_CARE_PROVIDER_SITE_OTHER): Payer: Medicare Other | Admitting: Orthopaedic Surgery

## 2022-01-29 VITALS — Ht 64.0 in | Wt 299.0 lb

## 2022-01-29 DIAGNOSIS — M25561 Pain in right knee: Secondary | ICD-10-CM

## 2022-01-29 DIAGNOSIS — M25562 Pain in left knee: Secondary | ICD-10-CM | POA: Diagnosis not present

## 2022-01-29 DIAGNOSIS — G8929 Other chronic pain: Secondary | ICD-10-CM

## 2022-01-29 DIAGNOSIS — M1711 Unilateral primary osteoarthritis, right knee: Secondary | ICD-10-CM

## 2022-01-29 DIAGNOSIS — M1712 Unilateral primary osteoarthritis, left knee: Secondary | ICD-10-CM

## 2022-01-29 NOTE — Progress Notes (Signed)
The patient is a 56 year old female with debilitating left knee pain and well-documented osteoarthritis of the left knee.  She is a diabetic better he last hemoglobin A1c was below 6.  This was recent.  She is morbidly obese with a BMI of 51.32.  She has had steroid injections in the past in the left knee and she said none of those have helped her at all.  She did see orthopedic surgeons at [facility] who recommended weight loss appropriately and could not proceed with any type of surgery until she is lost weight.  She comes here for another opinion.  She has recently started some type of weight loss medication.  Examination of her right knee shows good alignment overall.  Examination of her left knee shows significant varus malalignment.  There is global pain around both knees but is much more severe over the left knee and the right knee.  She has good range of motion of both knees and does not really have his large soft tissue envelope around both knees.  She understands her hands are definitely tied until she loses more weight given that her BMI is already 51.32.  I applaud her good blood glucose control and she should still maintain that.  I would like to see her back in 3 months with a repeat weight and BMI calculation to see if she has lost any that would allow [place] to at some point consider proceeding with a left total knee arthroplasty.

## 2022-01-30 ENCOUNTER — Ambulatory Visit: Payer: Medicare Other | Admitting: Student

## 2022-01-30 NOTE — Progress Notes (Deleted)
    SUBJECTIVE:   CHIEF COMPLAINT / HPI:   BMI >50  Prediabetes Patient seen at the beginning of May and started on semaglutide 0.25 mg weekly.  Here today for follow-up.  Note that she was seen by orthopedic surgery yesterday with her weight down 12 pounds over the past month.  Patient reports***issues with her semaglutide.  No abdominal pain or injection site issues.    PERTINENT  PMH / PSH: ***  OBJECTIVE:   LMP 02/21/2015   ***  ASSESSMENT/PLAN:   No problem-specific Assessment & Plan notes found for this encounter.     Pearla Dubonnet, MD [place]

## 2022-02-02 ENCOUNTER — Other Ambulatory Visit: Payer: Self-pay

## 2022-02-03 MED ORDER — SEMAGLUTIDE(0.25 OR 0.5MG/DOS) 2 MG/1.5ML ~~LOC~~ SOPN
0.5000 mg | PEN_INJECTOR | SUBCUTANEOUS | 0 refills | Status: DC
Start: 2022-02-03 — End: 2022-03-02

## 2022-02-07 ENCOUNTER — Ambulatory Visit (INDEPENDENT_AMBULATORY_CARE_PROVIDER_SITE_OTHER): Payer: Medicare Other | Admitting: Podiatry

## 2022-02-07 DIAGNOSIS — B351 Tinea unguium: Secondary | ICD-10-CM | POA: Diagnosis not present

## 2022-02-07 DIAGNOSIS — M79675 Pain in left toe(s): Secondary | ICD-10-CM

## 2022-02-07 DIAGNOSIS — M79674 Pain in right toe(s): Secondary | ICD-10-CM | POA: Diagnosis not present

## 2022-02-07 NOTE — Progress Notes (Signed)
  Subjective:  Patient ID: Kendra [name], female    DOB: May 18, 1966,  MRN: [id number]  Chief Complaint  Patient presents with   Callouses    Bilateral painful corns   56 y.o. female returns for the above complaint.  Patient presents with thickened elongated dystrophic toenails x10.  Pain on palpation.  Patient would like to have the debrided out she is not able to do it herself.  She does not have any secondary complaints today.  Objective:  There were no vitals filed for this visit. Podiatric Exam: Vascular: dorsalis pedis and posterior tibial pulses are palpable bilateral. Capillary return is immediate. Temperature gradient is WNL. Skin turgor WNL  Sensorium: Normal Semmes Weinstein monofilament test. Normal tactile sensation bilaterally. Nail Exam: Pt has thick disfigured discolored nails with subungual debris noted bilateral entire nail hallux through fifth toenails.  Pain on palpation to the nails. Ulcer Exam: There is no evidence of ulcer or pre-ulcerative changes or infection. Orthopedic Exam: Muscle tone and strength are WNL. No limitations in general ROM. No crepitus or effusions noted.  No further pain on palpation to the left ankle joint pain with dorsiflexion of the ankle joint.  No further deep intra-articular pain noted Skin: No Porokeratosis. No infection or ulcers    Assessment & Plan:   No diagnosis found.   Patient was evaluated and treated and all questions answered.  Left ankle joint capsulitis -Clinically healed and is doing well.  No further issues   Onychomycosis with pain  -Nails palliatively debrided as below. -Educated on self-care  Procedure: Nail Debridement Rationale: pain  Type of Debridement: manual, sharp debridement. Instrumentation: Nail nipper, rotary burr. Number of Nails: 10  Procedures and Treatment: Consent by patient was obtained for treatment procedures. The patient understood the discussion of treatment and procedures well. All  questions were answered thoroughly reviewed. Debridement of mycotic and hypertrophic toenails, 1 through 5 bilateral and clearing of subungual debris. No ulceration, no infection noted.  Return Visit-Office Procedure: Patient instructed to return to the office for a follow up visit 3 months for continued evaluation and treatment.  Boneta Lucks, DPM    No follow-ups on file.

## 2022-03-02 ENCOUNTER — Ambulatory Visit (INDEPENDENT_AMBULATORY_CARE_PROVIDER_SITE_OTHER): Payer: Medicare Other | Admitting: Student

## 2022-03-02 ENCOUNTER — Telehealth: Payer: Self-pay

## 2022-03-02 ENCOUNTER — Encounter: Payer: Self-pay | Admitting: Student

## 2022-03-02 DIAGNOSIS — Z23 Encounter for immunization: Secondary | ICD-10-CM

## 2022-03-02 DIAGNOSIS — K219 Gastro-esophageal reflux disease without esophagitis: Secondary | ICD-10-CM | POA: Diagnosis not present

## 2022-03-02 DIAGNOSIS — F17200 Nicotine dependence, unspecified, uncomplicated: Secondary | ICD-10-CM | POA: Diagnosis not present

## 2022-03-02 DIAGNOSIS — Z59819 Housing instability, housed unspecified: Secondary | ICD-10-CM

## 2022-03-02 DIAGNOSIS — M170 Bilateral primary osteoarthritis of knee: Secondary | ICD-10-CM | POA: Diagnosis not present

## 2022-03-02 MED ORDER — PANTOPRAZOLE SODIUM 40 MG PO TBEC
40.0000 mg | DELAYED_RELEASE_TABLET | Freq: Two times a day (BID) | ORAL | 1 refills | Status: DC
  Filled 2022-04-09: qty 60, 30d supply, fill #0

## 2022-03-02 MED ORDER — SEMAGLUTIDE (1 MG/DOSE) 4 MG/3ML ~~LOC~~ SOPN: 1.0000 mg | PEN_INJECTOR | SUBCUTANEOUS | 1 refills | Status: DC

## 2022-03-02 MED ORDER — VARENICLINE TARTRATE 0.5 MG PO TABS: 0.5000 mg | ORAL_TABLET | Freq: Two times a day (BID) | ORAL | 2 refills | Status: DC

## 2022-03-02 NOTE — Assessment & Plan Note (Addendum)
Patient reports frequent reflux episodes. She is not currently on medication for reflux. - Will restart pt on Protonix 40 mg BID - Encouraged her to avoid trigger foods

## 2022-03-02 NOTE — Patient Instructions (Addendum)
Ms. Karrington,  It is always so good to see you!  Congratulations on your weight loss!  This is truly one of the very best things that she can do for your health.  I am so inspired by the progress that you have made.  Keep up the good work! We are going to increase the dose of your Ozempic, you should continue taking this once per week.  I will see you back in about 4 weeks. I am inspired that you are motivated to quit smoking.  Since you were not able to complete the full course of Chantix last time, we will start from the beginning.  You will start by taking 1 tablet once per day for 1 week and then increase to 1 tablet twice per day.  Be sure that you always take this medicine with meals as it can cause some stomach upset.  I have made you an appointment with Dr. Everitt Amber in 2 weeks to discuss smoking cessation further and he will likely increase your dose of Chantix from there.  Pearla Dubonnet, MD

## 2022-03-02 NOTE — Progress Notes (Deleted)
    SUBJECTIVE:   CHIEF COMPLAINT / HPI:   Obesity Here for follow-up of obesity. Has been on Ozempic 0.'25mg'$  for two months now, missed last follow-up appt. Has been tolerating without issue. Weight is ***.   OA of Bilateral Knees Saw Dr. Ninfa Linden who recommended 80month of weight loss efforts. Following up in September. If has been successful in weight loss efforts, can consider moving forward with surgery at that time.   PERTINENT  PMH / PSH: ***  OBJECTIVE:   LMP 02/21/2015   ***  ASSESSMENT/PLAN:   No problem-specific Assessment & Plan notes found for this encounter.     BPearla Dubonnet MD [place]

## 2022-03-02 NOTE — Assessment & Plan Note (Addendum)
Patient is currently smoking 2 ppd. She is not on Chantix, had been prescribed previously but patient never started in earnest.  Very much interested in quitting smoking today, would like to restart Chantix. - Prescribed Chantix 0.5 mg daily, will increase to twice daily in 1 week and then can further increase at visit with Dr. Everitt Amber in 2 weeks

## 2022-03-02 NOTE — Assessment & Plan Note (Addendum)
Patient is working on weight loss via lifestyle modifications and Ozempic. She is down 14lbs since starting this medication. - Congratulated pt on weight loss and encouraged her to continue working on lifestyle changes as possible - Increased Ozempic dose to 1 mg injection weekly.

## 2022-03-02 NOTE — Telephone Encounter (Signed)
   Telephone encounter was:  Successful.  03/02/2022 Name: Kendra [name] MRN: [id number] DOB: 06/17/1966  Kendra [name] is a 56 y.o. year old female who is a primary care patient of Joelyn Oms, Dorene Grebe, MD . The community resource team was consulted for assistance with  housing  Care guide performed the following interventions: Patient provided with information about care guide support team and interviewed to confirm resource needs.Owner sold the home Eviction Pending for Patient and Mother on the 28th. Needs help with housing  Follow Up Plan:  Care guide will follow up with patient by phone over the next week    [place], Care Management  [phone] [address], [place], [place] [address] Phone: [phone] Email: [email or web address]

## 2022-03-02 NOTE — Progress Notes (Addendum)
SUBJECTIVE:   CHIEF COMPLAINT / HPI:   Kendra [name] is a 56 y.o. female who presents today for follow-up of obesity and OA.  Morbid Obesity Here for follow-up of obesity. Has been on Ozempic since 12/2021, currently up to 0.'5mg'$ /week; missed last follow-up appt. Has been tolerating without issue. Weight is 297 lb, down 14 lbs since starting Ozempic. Would like to exercise more but limited by knee pain; was doing PT previously and did help, but told not to come back until after her knee surgery given limit on number of medicaid visits. Doing home exercises.   OA of Bilateral Knees Longstanding history of same. Saw Dr. Ninfa [name] in 01/2022 who recommended 3 months of weight loss efforts. Following up in 04/2022. If has been successful in weight loss efforts, can consider moving forward with surgery at that time.  She continues to report L knee pain which significantly limits her activity level.   GERD Patient reports frequent episodes of reflux recently, especially aftering drinking sodas or OJ. She was previously on Protonix and Mylanta, but has not had these medications recently.  Tobacco Use Currently smoking 2 ppd; she relates increased amount to stress. Still wants to quit. Needs repeat appt with Dr. Valentina Lucks. Not on Chantix currently.  Housing Instability Housing concerns over pending eviction on 03/23/2022.  Healthcare Maintenance Patient would like to get her TDAP today. She is planning to get her first Shingrix shot soon with her mother. Patient would prefer to do her pap smear at next appt.  PERTINENT  PMH / PSH: Bilat knee OE, obesity, tobacco use, HLD, GERD  OBJECTIVE:   BP 115/65   Pulse (!) 101   Wt 297 lb 12.8 oz (135.1 kg)   LMP 02/21/2015   SpO2 98%   BMI 51.12 kg/m   General: Well-appearing patient seated comfortably in chair. No acute distress. Cardiovascular: RRR, no murmurs, rubs, gallops. Pulmonary: Normal work of breathing. Lungs clear to auscultation  bilaterally. No wheeze Neuro/Psych: A&Ox3. Normal affect.  ASSESSMENT/PLAN:   TOBACCO ABUSE Patient is currently smoking 2 ppd. She is not on Chantix, had been prescribed previously but patient never started in earnest.  Very much interested in quitting smoking today, would like to restart Chantix. - Prescribed Chantix 0.5 mg daily, will increase to twice daily in 1 week and then can further increase at visit with Dr. Everitt Amber in 2 weeks  Gastroesophageal reflux disease Patient reports frequent reflux episodes. She is not currently on medication for reflux. - Will restart pt on Protonix 40 mg BID - Encouraged her to avoid trigger foods  Primary osteoarthritis of both knees Patient continues to report L knee pain which is limiting her ADLs. She has an appointment scheduled with Dr. Ninfa [name] to consider knee surgery in 04/2022. - Encouraged pt to continue performing home exercises and to stay active as much as possible.  Morbid obesity ([place]) Patient is working on weight loss via lifestyle modifications and Ozempic. She is down 14lbs since starting this medication. - Congratulated pt on weight loss and encouraged her to continue working on lifestyle changes as possible - Increased Ozempic dose to 1 mg injection weekly.   Housing Instability Patient is very concerned about upcoming eviction at the end of this month. - Provided referral to [facility] for help in identifying housing resources - Resources provided in clinic today  Healthcare Maintenance - TDAP booster given in clinic today - Encouraged patient to get Shingrix vaccine. - Will perform Pap at  next appt.  [name], Medical Student [place]   I was personally present and performed or re-performed the history, physical exam and medical decision making activities of this service and have verified that the service and findings are accurately documented in the student's  note.  Pearla Dubonnet, MD                  03/02/2022, 4:32 PM

## 2022-03-02 NOTE — Assessment & Plan Note (Signed)
Patient continues to report L knee pain which is limiting her ADLs. She has an appointment scheduled with Dr. Ninfa Linden to consider knee surgery in 04/2022. - Encouraged pt to continue performing home exercises and to stay active as much as possible.

## 2022-03-07 ENCOUNTER — Telehealth: Payer: Self-pay

## 2022-03-07 NOTE — Telephone Encounter (Signed)
   Telephone encounter was:  Unsuccessful.  03/07/2022 Name: Kendra [name] MRN: [id number] DOB: 10-Jul-1966  Unsuccessful outbound call made today to assist with:   housing  Patient called in asking if I could call her back. There was no answer  A HIPAA compliant voice message was left requesting a return call.  Instructed patient to call back at  earliest convenience.   [facility], Care Management  [phone] [address], [place], [place] [address] Phone: [phone] Email: [email or web address]

## 2022-03-08 ENCOUNTER — Other Ambulatory Visit: Payer: Self-pay | Admitting: Student

## 2022-03-08 DIAGNOSIS — M7989 Other specified soft tissue disorders: Secondary | ICD-10-CM

## 2022-03-16 ENCOUNTER — Ambulatory Visit: Payer: Medicare Other | Admitting: Pharmacist

## 2022-03-16 ENCOUNTER — Telehealth: Payer: Self-pay

## 2022-03-16 NOTE — Telephone Encounter (Signed)
   Telephone encounter was:  Successful.  03/16/2022 Name: Kendra [name] MRN: [id number] DOB: 02-Oct-1965  Kendra [name] is a 56 y.o. year old female who is a primary care patient of Eppie Gibson, MD . The community resource team was consulted for assistance with  [place] guide performed the following interventions: Patient provided with information about care guide support team and interviewed to confirm resource needs.Follow up for mailed resources, the Patient did receive the resources   Follow Up Plan:  No further follow up planned at this time. The patient has been provided with needed resources.    [facility], Care Management  [phone] [address], [name], [place] [address] Phone: [phone] Email: [email or web address]

## 2022-03-27 ENCOUNTER — Ambulatory Visit: Payer: Medicare Other | Admitting: Student

## 2022-04-09 ENCOUNTER — Other Ambulatory Visit (HOSPITAL_BASED_OUTPATIENT_CLINIC_OR_DEPARTMENT_OTHER): Payer: Self-pay

## 2022-04-11 ENCOUNTER — Other Ambulatory Visit (HOSPITAL_BASED_OUTPATIENT_CLINIC_OR_DEPARTMENT_OTHER): Payer: Self-pay

## 2022-04-11 ENCOUNTER — Other Ambulatory Visit (HOSPITAL_COMMUNITY): Payer: Self-pay

## 2022-04-20 ENCOUNTER — Other Ambulatory Visit (HOSPITAL_COMMUNITY): Payer: Self-pay

## 2022-05-02 ENCOUNTER — Ambulatory Visit: Payer: Medicare Other | Admitting: Orthopaedic Surgery

## 2022-05-07 ENCOUNTER — Other Ambulatory Visit: Payer: Self-pay | Admitting: Student

## 2022-05-08 ENCOUNTER — Encounter: Payer: Self-pay | Admitting: Physician Assistant

## 2022-05-08 ENCOUNTER — Ambulatory Visit: Payer: Medicare Other | Attending: Physician Assistant | Admitting: Physician Assistant

## 2022-05-08 VITALS — BP 118/64 | HR 108 | Ht 64.0 in | Wt 288.6 lb

## 2022-05-08 DIAGNOSIS — R0602 Shortness of breath: Secondary | ICD-10-CM | POA: Diagnosis not present

## 2022-05-08 DIAGNOSIS — F17200 Nicotine dependence, unspecified, uncomplicated: Secondary | ICD-10-CM | POA: Diagnosis not present

## 2022-05-08 DIAGNOSIS — E782 Mixed hyperlipidemia: Secondary | ICD-10-CM | POA: Diagnosis not present

## 2022-05-08 DIAGNOSIS — I2510 Atherosclerotic heart disease of native coronary artery without angina pectoris: Secondary | ICD-10-CM | POA: Diagnosis not present

## 2022-05-08 DIAGNOSIS — R000 Tachycardia, unspecified: Secondary | ICD-10-CM | POA: Insufficient documentation

## 2022-05-08 NOTE — Assessment & Plan Note (Signed)
She is now on Chantix and trying to quit.

## 2022-05-08 NOTE — Progress Notes (Signed)
Cardiology Office Note:    Date:  05/08/2022   ID:  [name], DOB 03/15/1966, MRN [id number]  PCP:  Eppie Gibson, MD  [facility] Providers Cardiologist:  Sherren Mocha, MD Cardiology APP:  Sharmon Revere     Referring MD: Eppie Gibson, MD   Chief Complaint:  F/u for CAD    Patient Profile: Chest pain  Coronary artery disease [non-obstructive] Myoview 6/19: Low Risk, no ischemia Coronary CTA 05/2019: Ca score 81.5 (96th percentile); minimal plaque (<25%) in mRCA, dLM and pLAD; mild plaque (25-49%) in oLAD Tobacco use Hyperlipidemia  GERD  Prior CV Studies:  Coronary CTA 06/05/2019 IMPRESSION: 1. Coronary calcium score of 81.5. This was 45 percentile for age and sex matched control. 2. Normal coronary origin with right dominance. 3. Mild mixed attenuation plaque in the ostium of LAD. Otherwise minimal (<25%) disease in the distal left main and RCA. 4. Recommend aggressive risk factor modification and high-potency statin.   Echocardiogram 04/16/2019 EF 16-38, normal diastolic function, normal RVSF   Myoview 01/29/18 EF 60, attenuation artifact, no ischemia; low risk   Echocardiogram 03/02/2016 Mild LVH, EF 55-60, no RWMA, normal diastolic fn  History of Present Illness:   Kendra [name] is a 56 y.o. female with the above problem list.  She was last seen in April 2022. She returns for f/u.  She is here today with her granddaughter.  She continues to have right-sided chest discomfort.  She has had this for years without change.  It is worse with positional changes as well as palpation.  She has shortness of breath with certain activities.  She also has significant arthritis in her knees which limits her mobility.  She has not had syncope or orthopnea.  She has chronic leg edema.  She does feel her heart rate increasing at times.        Past Medical History:  Diagnosis Date   Anemia    Asthma    Esophagitis    GERD (gastroesophageal reflux  disease)    HTN (hypertension)    Hyperplastic colon polyp    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    Cor CTA 05/2019: Ca score 81.5 (96th percentile); mRCA < 25; dLM < 25; oLAD 25-49, pLAD < 25   Current Medications: Current Meds  Medication Sig   acetaminophen (TYLENOL 8 HOUR) 650 MG CR tablet Take 1 tablet (650 mg total) by mouth every 8 (eight) hours as needed for pain.   albuterol (PROAIR HFA) 108 (90 Base) MCG/ACT inhaler inhale 2 puffs every 4 hours if needed wheezing or shortness of breath   alum & mag hydroxide-simeth (MAALOX/MYLANTA) 200-200-20 MG/5ML suspension Take 30 mLs by mouth every 6 (six) hours as needed for indigestion or heartburn. Reported on 10/26/2015   aspirin EC 81 MG tablet Take 1 tablet (81 mg total) by mouth daily. Swallow whole.   buPROPion (WELLBUTRIN XL) 150 MG 24 hr tablet Take 1 tablet (150 mg total) by mouth daily after breakfast.   Evolocumab (REPATHA SURECLICK) 466 MG/ML SOAJ ADMINISTER 1 ML UNDER THE SKIN EVERY 14 DAYS   ezetimibe (ZETIA) 10 MG tablet TAKE 1 TABLET(10 MG) BY MOUTH DAILY   furosemide (LASIX) 20 MG tablet TAKE 1 TABLET(20 MG) BY MOUTH DAILY   gabapentin (NEURONTIN) 300 MG capsule Take 1 tablet ('300mg'$ ) once during the daytime and another at bedtime   icosapent Ethyl (VASCEPA) 1 g capsule TAKE 2 CAPSULES(2 GRAMS) BY MOUTH TWICE DAILY   meloxicam (  MOBIC) 15 MG tablet Take 1 tablet (15 mg total) by mouth daily.   nortriptyline (PAMELOR) 25 MG capsule Take 1 capsule (25 mg total) by mouth at bedtime.   pantoprazole (PROTONIX) 40 MG tablet Take 1 tablet (40 mg total) by mouth 2 (two) times daily before a meal.   rosuvastatin (CRESTOR) 20 MG tablet Take 1 tablet (20 mg total) by mouth daily.   Semaglutide, 1 MG/DOSE, 4 MG/3ML SOPN Inject 1 mg into the skin once a week.   varenicline (CHANTIX) 0.5 MG tablet Take 1 tablet (0.5 mg total) by mouth 2 (two) times daily. Start by taking 1 tablet by mouth once per day WITH A MEAL. Do this for one  week. Then increase to 1 tablet twice per day WITH MEALS.    Allergies:   Lisinopril   Social History   Tobacco Use   Smoking status: Every Day    Packs/day: 1.00    Types: Cigarettes    Start date: 08/27/2002   Smokeless tobacco: Never   Tobacco comments:    cut back to 4-5 cigs per day from 1ppd  Vaping Use   Vaping Use: Never used  Substance Use Topics   Alcohol use: Yes    Alcohol/week: 0.0 standard drinks of alcohol    Comment: 2 -40oz daily,  24oz daily.     Drug use: No    Family Hx: The patient's family history includes Diabetes in her mother and sister; Heart disease in her mother; Lung cancer (age of onset: 59) in her father. There is no history of Breast cancer, Stomach cancer, Colon cancer, or Pancreatic cancer.  Review of Systems  Constitutional: Negative for chills and fever.  Respiratory:  Negative for cough.   Gastrointestinal:  Negative for diarrhea, hematochezia and vomiting.  Genitourinary:  Negative for hematuria.     EKGs/Labs/Other Test Reviewed:    EKG:  EKG is  ordered today.  The ekg ordered today demonstrates sinus tachycardia, HR 108, normal axis, nonspecific ST-T wave changes, QTc 426  Recent Labs: No results found for requested labs within last 365 days.   Recent Lipid Panel Recent Labs    07/17/21 1200  CHOL 104  TRIG 90  HDL 58  LDLCALC 29     Risk Assessment/Calculations/Metrics:              Physical Exam:    VS:  BP 118/64   Pulse (!) 108   Ht '5\' 4"'$  (1.626 m)   Wt 288 lb 9.6 oz (130.9 kg)   LMP 02/21/2015   SpO2 95%   BMI 49.54 kg/m     Wt Readings from Last 3 Encounters:  05/08/22 288 lb 9.6 oz (130.9 kg)  03/02/22 297 lb 12.8 oz (135.1 kg)  01/29/22 299 lb (135.6 kg)    Constitutional:      Appearance: Healthy appearance. Not in distress.  Neck:     Vascular: JVD normal.  Pulmonary:     Effort: Pulmonary effort is normal.     Breath sounds: No wheezing. No rales.  Cardiovascular:     Tachycardia present.  Regular rhythm. Normal S1. Normal S2.      Murmurs: There is no murmur.  Edema:    Peripheral edema present.    Pretibial: bilateral trace edema of the pretibial area. Abdominal:     Palpations: Abdomen is soft.  Skin:    General: Skin is warm and dry.  Neurological:     Mental Status: Alert and oriented to person,  place and time.         ASSESSMENT & PLAN:   Shortness of breath She has shortness of breath with several activities.  Her lungs are clear on exam and neck veins are flat.  EKG demonstrates sinus tachycardia today.  Etiology of this is not clear.  I will obtain a CBC, BMET, TSH and BNP.  Her BNP is significantly elevated, I will adjust her furosemide and obtain a follow-up echocardiogram.  Otherwise follow-up in 1 year.  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 CT in 2020 with minimal nonobstructive CAD and calcium score 81.5 (96 percentile).  She has had chronic chest pain without significant change.  I have asked her to follow-up with primary care for musculoskeletal chest pain.  Continue aspirin 81 mg daily, Crestor 20 mg daily, Repatha 140 mg every 14 days, Vascepa 2 g twice daily.  Hyperlipidemia Lipids optimal in November 2022.  Continue current dose of Repatha, Vascepa, Crestor.  TOBACCO ABUSE She is now on Chantix and trying to quit.  Sinus tachycardia Etiology not clear. She has not had any infectious symptoms or recent stimulants. Increased HR may be due to chronic smoking in the setting of morbid obesity. Obtain CBC, TSH, BMET today.   Morbid obesity ([place]) She is now on semaglutide. She did gain wt to 311 lbs in May 2023. She has lost 23 lbs since.             Dispo:  Return in about 1 year (around 05/09/2023) for Routine Follow Up, w/ Richardson Dopp, PA-C.   Medication Adjustments/Labs and Tests Ordered: Current medicines are reviewed at length with the patient today.  Concerns regarding medicines are outlined above.  Tests Ordered: Orders Placed This  Encounter  Procedures   Basic metabolic panel   Pro b natriuretic peptide (BNP)   CBC   TSH   EKG 12-Lead   Medication Changes: No orders of the defined types were placed in this encounter.  Signed, Richardson Dopp, PA-C  05/08/2022 3:21 PM    [address], [place],   [address] Phone: [phone]; Fax: [phone]

## 2022-05-08 NOTE — Assessment & Plan Note (Signed)
CT in 2020 with minimal nonobstructive CAD and calcium score 81.5 (96 percentile).  She has had chronic chest pain without significant change.  I have asked her to follow-up with primary care for musculoskeletal chest pain.  Continue aspirin 81 mg daily, Crestor 20 mg daily, Repatha 140 mg every 14 days, Vascepa 2 g twice daily.

## 2022-05-08 NOTE — Assessment & Plan Note (Signed)
She is now on semaglutide. She did gain wt to 311 lbs in May 2023. She has lost 23 lbs since.

## 2022-05-08 NOTE — Patient Instructions (Signed)
Medication Instructions:  Your physician recommends that you continue on your current medications as directed. Please refer to the Current Medication list given to you today.  *If you need a refill on your cardiac medications before your next appointment, please call your pharmacy*   Lab Work: TODAY:  BMET, CBC, TSH, & PRO BNP  If you have labs (blood work) drawn today and your tests are completely normal, you will receive your results only by: [place] (if you have MyChart) OR A paper copy in the mail If you have any lab test that is abnormal or we need to change your treatment, we will call you to review the results.   Testing/Procedures: None ordered   Follow-Up: At [facility], you and your health needs are our priority.  As part of our continuing mission to provide you with exceptional heart care, we have created designated Provider Care Teams.  These Care Teams include your primary Cardiologist (physician) and Advanced Practice Providers (APPs -  Physician Assistants and Nurse Practitioners) who all work together to provide you with the care you need, when you need it.  We recommend signing up for the patient portal called "MyChart".  Sign up information is provided on this After Visit Summary.  MyChart is used to connect with patients for Virtual Visits (Telemedicine).  Patients are able to view lab/test results, encounter notes, upcoming appointments, etc.  Non-urgent messages can be sent to your provider as well.   To learn more about what you can do with MyChart, go to [email or web address].    Your next appointment:   1 year(s)  The format for your next appointment:   In Person  Provider:   Richardson Dopp, PA-C         Other Instructions   Important Information About Sugar

## 2022-05-08 NOTE — Assessment & Plan Note (Signed)
She has shortness of breath with several activities.  Her lungs are clear on exam and neck veins are flat.  EKG demonstrates sinus tachycardia today.  Etiology of this is not clear.  I will obtain a CBC, BMET, TSH and BNP.  Her BNP is significantly elevated, I will adjust her furosemide and obtain a follow-up echocardiogram.  Otherwise follow-up in 1 year.

## 2022-05-08 NOTE — Assessment & Plan Note (Signed)
Etiology not clear. She has not had any infectious symptoms or recent stimulants. Increased HR may be due to chronic smoking in the setting of morbid obesity. Obtain CBC, TSH, BMET today.

## 2022-05-08 NOTE — Assessment & Plan Note (Signed)
Lipids optimal in November 2022.  Continue current dose of Repatha, Vascepa, Crestor.

## 2022-05-09 ENCOUNTER — Other Ambulatory Visit: Payer: Self-pay | Admitting: Student

## 2022-05-09 LAB — BASIC METABOLIC PANEL
BUN/Creatinine Ratio: 16 (ref 9–23)
BUN: 14 mg/dL (ref 6–24)
CO2: 18 mmol/L — ABNORMAL LOW (ref 20–29)
Calcium: 10.6 mg/dL — ABNORMAL HIGH (ref 8.7–10.2)
Chloride: 107 mmol/L — ABNORMAL HIGH (ref 96–106)
Creatinine, Ser: 0.9 mg/dL (ref 0.57–1.00)
Glucose: 142 mg/dL — ABNORMAL HIGH (ref 70–99)
Potassium: 4.3 mmol/L (ref 3.5–5.2)
Sodium: 138 mmol/L (ref 134–144)
eGFR: 75 mL/min/{1.73_m2} (ref >59)

## 2022-05-09 LAB — CBC
Hematocrit: 43.5 % (ref 34.0–46.6)
Hemoglobin: 14.9 g/dL (ref 11.1–15.9)
MCH: 33.6 pg — ABNORMAL HIGH (ref 26.6–33.0)
MCHC: 34.3 g/dL (ref 31.5–35.7)
MCV: 98 fL — ABNORMAL HIGH (ref 79–97)
Platelets: 266 10*3/uL (ref 150–450)
RBC: 4.44 x10E6/uL (ref 3.77–5.28)
RDW: 12.7 % (ref 11.7–15.4)
WBC: 6.1 10*3/uL (ref 3.4–10.8)

## 2022-05-09 LAB — TSH: TSH: 1.09 u[IU]/mL (ref 0.450–4.500)

## 2022-05-09 LAB — PRO B NATRIURETIC PEPTIDE: NT-Pro BNP: <36 pg/mL (ref 0–287)

## 2022-05-09 MED ORDER — SEMAGLUTIDE (1 MG/DOSE) 4 MG/3ML ~~LOC~~ SOPN: 1.0000 mg | PEN_INJECTOR | SUBCUTANEOUS | 0 refills | Status: DC

## 2022-05-11 ENCOUNTER — Ambulatory Visit: Payer: Medicaid Other | Admitting: Podiatry

## 2022-05-16 ENCOUNTER — Ambulatory Visit: Payer: Medicare Other | Admitting: Orthopaedic Surgery

## 2022-05-17 ENCOUNTER — Ambulatory Visit (INDEPENDENT_AMBULATORY_CARE_PROVIDER_SITE_OTHER): Payer: Medicare Other | Admitting: Family Medicine

## 2022-05-17 ENCOUNTER — Other Ambulatory Visit (HOSPITAL_COMMUNITY)
Admission: RE | Admit: 2022-05-17 | Discharge: 2022-05-17 | Disposition: A | Discharge status: Home / Self Care | Payer: Medicare Other | Source: Ambulatory Visit | Attending: Family Medicine | Admitting: Family Medicine

## 2022-05-17 ENCOUNTER — Encounter: Payer: Self-pay | Admitting: Family Medicine

## 2022-05-17 VITALS — BP 132/90 | HR 89 | Ht 64.0 in | Wt 289.4 lb

## 2022-05-17 DIAGNOSIS — R7303 Prediabetes: Secondary | ICD-10-CM | POA: Diagnosis not present

## 2022-05-17 DIAGNOSIS — Z23 Encounter for immunization: Secondary | ICD-10-CM

## 2022-05-17 DIAGNOSIS — Z01419 Encounter for gynecological examination (general) (routine) without abnormal findings: Secondary | ICD-10-CM | POA: Insufficient documentation

## 2022-05-17 DIAGNOSIS — Z1151 Encounter for screening for human papillomavirus (HPV): Secondary | ICD-10-CM | POA: Insufficient documentation

## 2022-05-17 DIAGNOSIS — Z113 Encounter for screening for infections with a predominantly sexual mode of transmission: Secondary | ICD-10-CM | POA: Diagnosis not present

## 2022-05-17 MED ORDER — SEMAGLUTIDE (2 MG/DOSE) 8 MG/3ML ~~LOC~~ SOPN: 2.0000 mg | PEN_INJECTOR | SUBCUTANEOUS | 0 refills | Status: DC

## 2022-05-17 NOTE — Patient Instructions (Addendum)
It was nice seeing you today!  Come back and see Dr. Joelyn Oms in the next 3 months.  Stay well, [name], MD [place] [facility] [phone]  --  Make sure to check out at the front desk before you leave today.  Please arrive at least 15 minutes prior to your scheduled appointments.  If you had blood work today, I will send you a MyChart message or a letter if results are normal. Otherwise, I will give you a call.  If you had a referral placed, they will call you to set up an appointment. Please give [place] a call if you don't hear back in the next 2 weeks.  If you need additional refills before your next appointment, please call your pharmacy first.

## 2022-05-17 NOTE — Progress Notes (Signed)
    SUBJECTIVE:   CHIEF COMPLAINT / HPI:  Chief Complaint  Patient presents with   Gynecologic Exam    On semaglutide 1 mg weekly for weight management, last dose increase was July 2023.  She is doing well with this dose, denies any side effects including nausea, vomiting, abdominal discomfort.  She wants to go up to the next dose.  Here for Pap smear and also wants to have STI screening.  Denies any symptoms such as vaginal discharge.  No new partners in the past year.  Last Pap smear June 2020 NILM, HPV cotesting was not performed.  She wants to have her A1c checked today as this was recommended by her cardiologist.  PERTINENT  PMH / [place]: Obesity, prediabetes, HLD  Patient Care Team: Eppie Gibson, MD as PCP - General (Family Medicine) Sherren Mocha, MD as PCP - Cardiology (Cardiology) Alda Berthold, DO as Consulting Physician (Neurology) Sharmon Revere as Physician Assistant (Cardiology)   OBJECTIVE:   BP (!) 132/90   Pulse 89   Ht '5\' 4"'$  (1.626 m)   Wt 289 lb 6.4 oz (131.3 kg)   LMP 02/21/2015   SpO2 100%   BMI 49.68 kg/m   Physical Exam Exam conducted with a chaperone present.  Constitutional:      General: She is not in acute distress. Cardiovascular:     Rate and Rhythm: Normal rate.  Pulmonary:     Effort: Pulmonary effort is normal. No respiratory distress.  Genitourinary:    General: Normal vulva.     Vagina: No vaginal discharge.  Musculoskeletal:     Cervical back: Neck supple.  Neurological:     Mental Status: She is alert.         05/17/2022    9:28 AM  Depression screen PHQ 2/9  Decreased Interest 0  Down, Depressed, Hopeless 0  PHQ - 2 Score 0  Altered sleeping 0  Tired, decreased energy 0  Change in appetite 0  Feeling bad or failure about yourself  0  Trouble concentrating 0  Moving slowly or fidgety/restless 0  Suicidal thoughts 0  PHQ-9 Score 0  Difficult doing work/chores Not difficult at all     Wt Readings from  Last 3 Encounters:  05/17/22 289 lb 6.4 oz (131.3 kg)  05/08/22 288 lb 9.6 oz (130.9 kg)  03/02/22 297 lb 12.8 oz (135.1 kg)        ASSESSMENT/PLAN:   Morbid obesity ([facility]) Commended on weight loss.  Tolerating semaglutide 1 mg, will increase to 2 mg dose.   Routine screening for STI We will check for chlamydia, gonorrhea, trichomonas on Pap.  Also check HIV and RPR.  Prediabetes - check A1c per patient request  HCM - pap smear performed today - flu shot given  Return in about 3 months (around 08/16/2022) for f/u weight loss.   [name], MD [place]

## 2022-05-17 NOTE — Assessment & Plan Note (Signed)
Commended on weight loss.  Tolerating semaglutide 1 mg, will increase to 2 mg dose.

## 2022-05-18 ENCOUNTER — Encounter: Payer: Self-pay | Admitting: Family Medicine

## 2022-05-18 LAB — CYTOLOGY - PAP
Adequacy: ABSENT
Chlamydia: NEGATIVE
Comment: NEGATIVE
Comment: NEGATIVE
Comment: NEGATIVE
Comment: NORMAL
Diagnosis: NEGATIVE
High risk HPV: NEGATIVE
Neisseria Gonorrhea: NEGATIVE
Trichomonas: NEGATIVE

## 2022-05-18 LAB — HIV ANTIBODY (ROUTINE TESTING W REFLEX): HIV Screen 4th Generation wRfx: NONREACTIVE

## 2022-05-18 LAB — HEMOGLOBIN A1C
Est. average glucose Bld gHb Est-mCnc: 114 mg/dL
Hgb A1c MFr Bld: 5.6 % (ref 4.8–5.6)

## 2022-05-18 LAB — RPR: RPR Ser Ql: NONREACTIVE

## 2022-05-30 ENCOUNTER — Other Ambulatory Visit: Payer: Self-pay | Admitting: Cardiovascular Disease

## 2022-05-30 DIAGNOSIS — I2510 Atherosclerotic heart disease of native coronary artery without angina pectoris: Secondary | ICD-10-CM

## 2022-05-30 DIAGNOSIS — E782 Mixed hyperlipidemia: Secondary | ICD-10-CM

## 2022-06-25 ENCOUNTER — Other Ambulatory Visit: Payer: Self-pay

## 2022-06-25 DIAGNOSIS — F17200 Nicotine dependence, unspecified, uncomplicated: Secondary | ICD-10-CM

## 2022-06-25 MED ORDER — VARENICLINE TARTRATE 0.5 MG PO TABS: 0.5000 mg | ORAL_TABLET | Freq: Two times a day (BID) | ORAL | 2 refills | Status: DC

## 2022-07-11 ENCOUNTER — Telehealth: Payer: Self-pay

## 2022-07-11 NOTE — Telephone Encounter (Signed)
Patient calls nurse line reporting SCAT forms were never received.  Per chart review SCAT forms were faxed over in September for completion. Forms were completed and faxed back, however they were never received back.   I have printed SCAT forms from [employer] and will refax.

## 2022-08-10 DIAGNOSIS — H2513 Age-related nuclear cataract, bilateral: Secondary | ICD-10-CM | POA: Diagnosis not present

## 2022-08-14 DIAGNOSIS — H5213 Myopia, bilateral: Secondary | ICD-10-CM | POA: Diagnosis not present

## 2022-08-15 ENCOUNTER — Other Ambulatory Visit: Payer: Self-pay | Admitting: Family Medicine

## 2022-09-04 ENCOUNTER — Ambulatory Visit (INDEPENDENT_AMBULATORY_CARE_PROVIDER_SITE_OTHER): Payer: Medicare Other | Admitting: Podiatry

## 2022-09-04 ENCOUNTER — Ambulatory Visit (INDEPENDENT_AMBULATORY_CARE_PROVIDER_SITE_OTHER): Payer: Medicare Other

## 2022-09-04 DIAGNOSIS — Z01818 Encounter for other preprocedural examination: Secondary | ICD-10-CM | POA: Diagnosis not present

## 2022-09-04 DIAGNOSIS — M216X2 Other acquired deformities of left foot: Secondary | ICD-10-CM

## 2022-09-04 NOTE — Progress Notes (Signed)
Subjective:  Patient ID: Kendra [name], female    DOB: [birth year],  MRN: [id number]  Chief Complaint  Patient presents with   Nail Problem    Nail trim     57 y.o. female presents with the above complaint.  Patient has a left submetatarsal 5 pain painful to touch is progressive gotten worse worse with ambulation or shoe pressure pain scale 7 out of 10 dull achy in nature would like to discuss treatment options she has tried all conservative care including padding protecting offloading debridement she would like to discuss surgical options at this time.  She denies any other acute complaints.  She is not a diabetic   Review of Systems: Negative except as noted in the HPI. Denies N/V/F/Ch.  Past Medical History:  Diagnosis Date   Anemia    Asthma    Esophagitis    GERD (gastroesophageal reflux disease)    HTN (hypertension)    Hyperplastic colon polyp    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    Cor CTA 05/2019: Ca score 81.5 (96th percentile); mRCA < 25; dLM < 25; oLAD 25-49, pLAD < 25    Current Outpatient Medications:    acetaminophen (TYLENOL 8 HOUR) 650 MG CR tablet, Take 1 tablet (650 mg total) by mouth every 8 (eight) hours as needed for pain., Disp: 30 tablet, Rfl: 1   albuterol (PROAIR HFA) 108 (90 Base) MCG/ACT inhaler, inhale 2 puffs every 4 hours if needed wheezing or shortness of breath, Disp: 8.5 g, Rfl: 3   alum & mag hydroxide-simeth (MAALOX/MYLANTA) 200-200-20 MG/5ML suspension, Take 30 mLs by mouth every 6 (six) hours as needed for indigestion or heartburn. Reported on 10/26/2015, Disp: 355 mL, Rfl: 1   aspirin EC 81 MG tablet, Take 1 tablet (81 mg total) by mouth daily. Swallow whole., Disp: 90 tablet, Rfl: 3   buPROPion (WELLBUTRIN XL) 150 MG 24 hr tablet, Take 1 tablet (150 mg total) by mouth daily after breakfast., Disp: 30 tablet, Rfl: 6   ezetimibe (ZETIA) 10 MG tablet, TAKE 1 TABLET(10 MG) BY MOUTH DAILY, Disp: 90 tablet, Rfl: 1   furosemide (LASIX) 20 MG  tablet, TAKE 1 TABLET(20 MG) BY MOUTH DAILY, Disp: 90 tablet, Rfl: 1   gabapentin (NEURONTIN) 300 MG capsule, Take 1 tablet ('300mg'$ ) once during the daytime and another at bedtime, Disp: 30 capsule, Rfl: 1   icosapent Ethyl (VASCEPA) 1 g capsule, TAKE 2 CAPSULES(2 GRAMS) BY MOUTH TWICE DAILY, Disp: 360 capsule, Rfl: 1   meloxicam (MOBIC) 15 MG tablet, Take 1 tablet (15 mg total) by mouth daily., Disp: 30 tablet, Rfl: 0   nortriptyline (PAMELOR) 25 MG capsule, Take 1 capsule (25 mg total) by mouth at bedtime., Disp: 30 capsule, Rfl: 5   OZEMPIC, 2 MG/DOSE, 8 MG/3ML SOPN, INJECT 2 MG INTO SKIN ONCE A WEEK, Disp: 9 mL, Rfl: 0   pantoprazole (PROTONIX) 40 MG tablet, Take 1 tablet (40 mg total) by mouth 2 (two) times daily before a meal., Disp: 120 tablet, Rfl: 1   REPATHA SURECLICK 353 MG/ML SOAJ, ADMINISTER 1 ML UNDER THE SKIN EVERY 14 DAYS, Disp: 2 mL, Rfl: 11   rosuvastatin (CRESTOR) 20 MG tablet, Take 1 tablet (20 mg total) by mouth daily., Disp: 90 tablet, Rfl: 1   varenicline (CHANTIX) 0.5 MG tablet, Take 1 tablet (0.5 mg total) by mouth 2 (two) times daily. Start by taking 1 tablet by mouth once per day WITH A MEAL. Do this for one week. Then increase  to 1 tablet twice per day WITH MEALS., Disp: 60 tablet, Rfl: 2  Social History   Tobacco Use  Smoking Status Every Day   Packs/day: 1.00   Types: Cigarettes   Start date: 08/27/2002  Smokeless Tobacco Never  Tobacco Comments   cut back to 4-5 cigs per day from 1ppd    Allergies  Allergen Reactions   Lisinopril Swelling   Objective:  There were no vitals filed for this visit. There is no height or weight on file to calculate BMI. Constitutional Well developed. Well nourished.  Vascular Dorsalis pedis pulses palpable bilaterally. Posterior tibial pulses palpable bilaterally. Capillary refill normal to all digits.  No cyanosis or clubbing noted. Pedal hair growth normal.  Neurologic Normal speech. Oriented to person, place, and  time. Epicritic sensation to light touch grossly present bilaterally.  Dermatologic Nails well groomed and normal in appearance. No open wounds. No skin lesions.  Orthopedic: Pain on palpation left submetatarsal 5.  Hurts with ambulation and hurts with pressure.  Porokeratotic lesion clinically appreciated mildly.   Radiographs: 3 views of skeletally mature adult left foot: Plantarflexed fifth metatarsal noted no other bony abnormalities identified.Pes planovalgus deformity noted. Assessment:   1. Plantar flexed metatarsal bone of left foot   2. Encounter for preoperative examination for general surgical procedure    Plan:  Patient was evaluated and treated and all questions answered.  Left plantarflexed fifth metatarsal -All questions and concerns were discussed with the patient in extensive detail given the amount of pain that she is experiencing she will benefit from left fifth metatarsal floating osteotomy.  I discussed this with patient she states understanding to proceed with surgery I discussed my preoperative for postop plan in extensive detail she states understand like to proceed with surgery. -Informed surgical risk consent was reviewed and read aloud to the patient.  I reviewed the films.  I have discussed my findings with the patient in great detail.  I have discussed all risks including but not limited to infection, stiffness, scarring, limp, disability, deformity, damage to blood vessels and nerves, numbness, poor healing, need for braces, arthritis, chronic pain, amputation, death.  All benefits and realistic expectations discussed in great detail.  I have made no promises as to the outcome.  I have provided realistic expectations.  I have offered the patient a 2nd opinion, which they have declined and assured me they preferred to proceed despite the risks   No follow-ups on file.

## 2022-09-06 ENCOUNTER — Other Ambulatory Visit (HOSPITAL_COMMUNITY): Payer: Self-pay

## 2022-09-14 ENCOUNTER — Telehealth: Payer: Self-pay | Admitting: Podiatry

## 2022-09-14 NOTE — Telephone Encounter (Signed)
DOS: 10/15/2022  [employer] Medicare Effective 08/27/2022 [place] [employer] [place] Access  Metatarsal Osteotomy 5th Lt [phone])  Deductible: $240 with $84.42 remaining Out-of-Pocket: $8,850 with $7,681.15 remaining CoInsurance: 20%  Notification or Prior Authorization is not required for the requested services  Decision ID #:[id number]  The number above acknowledges your inquiry and our response. Please write this number down and refer to it for future inquiries. Coverage and payment for an item or service is governed by the member's benefit plan document, and, if applicable, the provider's participation agreement with the Health Plan.

## 2022-09-17 ENCOUNTER — Encounter: Payer: Self-pay | Admitting: Student

## 2022-09-17 ENCOUNTER — Ambulatory Visit (INDEPENDENT_AMBULATORY_CARE_PROVIDER_SITE_OTHER): Payer: 59 | Admitting: Student

## 2022-09-17 ENCOUNTER — Telehealth: Payer: Self-pay | Admitting: Student

## 2022-09-17 DIAGNOSIS — F17200 Nicotine dependence, unspecified, uncomplicated: Secondary | ICD-10-CM

## 2022-09-17 DIAGNOSIS — M79604 Pain in right leg: Secondary | ICD-10-CM | POA: Diagnosis not present

## 2022-09-17 DIAGNOSIS — M79605 Pain in left leg: Secondary | ICD-10-CM

## 2022-09-17 DIAGNOSIS — Z6841 Body Mass Index (BMI) 40.0 and over, adult: Secondary | ICD-10-CM

## 2022-09-17 DIAGNOSIS — E213 Hyperparathyroidism, unspecified: Secondary | ICD-10-CM

## 2022-09-17 NOTE — Telephone Encounter (Signed)
Reviewed form and placed in PCP's box for completion.

## 2022-09-17 NOTE — Telephone Encounter (Signed)
Patient dropped off Access [place] form to be completed. Last DOS was 09/17/22. Placed in [employer].

## 2022-09-17 NOTE — Progress Notes (Signed)
    SUBJECTIVE:   CHIEF COMPLAINT / HPI:   Weight Loss Efforts Here for follow-up. Pleased that she has lost 33lbs since starting semaglutide. However, received a letter from her insurance stating that as of Jan 1 of this year, they will no longer be covering this medication for her. She has had minimal side effects from the medication and would love to continue taking it if possible. Feeling much better since losing some weight.  Hypercalcemia 10.6, incidental finding on recent labs, however, per chart review has been going on for a while. Never worked up as far as I can tell.   Leg Pain Cramping muscle pain in the backs of her bilateral legs. Was given nortriptyline for this but did not find that it was particularly helpful.  Ongoing, not debilitating but it is an annoying symptom for her.  Tobacco abuse Currently smoking about half a pack a day, down from 1 pack/day.  Has been on Chantix for the past 8 months or so and does find that this is helpful for cravings.  We discussed that this is an atypical dosing schedule for Chantix but given that she is having benefit, happy to continue.  She is also on Wellbutrin which she likewise thinks is somewhat helpful in controlling her cravings.  Has been following with Dr.Koval, next visit with him is next week.  Her current target quit date is before February 19 which is when she has podiatric surgery.   OBJECTIVE:   BP 121/77   Pulse 99   Wt 278 lb 12.8 oz (126.5 kg)   LMP 02/21/2015   SpO2 97%   BMI 47.86 kg/m   Physical Exam Constitutional:      General: She is not in acute distress. Cardiovascular:     Rate and Rhythm: Normal rate.     Pulses: Normal pulses.     Heart sounds: No murmur heard. Pulmonary:     Effort: Pulmonary effort is normal. No respiratory distress.     Breath sounds: No wheezing or rales.  Lymphadenopathy:     Cervical: No cervical adenopathy.  Psychiatric:        Mood and Affect: Mood normal.       ASSESSMENT/PLAN:   BMI 45.0-49.9, adult (HCC) Has successfully lost 33lbs on Ozempic. Unfortunately insurance is now refusing to pay.  I continue to believe that this medication is the best option for her. -Will appeal insurance decision, will ask pharmacy team for assistance here.  Hypercalcemia Per chart review, seems this has been ongoing for a while now, has not previously been worked up.  Certainly warrants further workup as it is likely contributing to her lower extremity symptoms.  She is up-to-date on all of her cancer screenings.  Does not qualify for low-dose CT given that she only has a 19-year smoking history, just shy of the 20-pack-year cut off. -Will check a chemistry and a PTH  Bilateral leg pain Not responding to nortriptyline.  Very likely this may be related to her hypercalcemia.  Workup as above. -Discontinue nortriptyline.  TOBACCO ABUSE Has cut down to 1/2 pack/day.  Continues to follow with Dr. Valentina Lucks.  Target quit date of February 19. -Continue Chantix as she believes that is helping -Continue Wellbutrin 150 mg daily -I will follow-up with her on February 20 to see if she has had success in quitting -Follow-up with Dr. Valentina Lucks next week.     Pearla Dubonnet, MD

## 2022-09-17 NOTE — Patient Instructions (Signed)
Ms. Strahm,  I wonder if the cramping that you are having in your legs might be related to your calcium levels.  I am going to look into this further with some lab work today.  I will give you a call if we need to do any further looking or make any changes based on your lab results.  Since the nortriptyline does not seem to be helping, please stop taking this medicine, it has too many potential side effects to be doing as much good at this time. Congratulations on cutting back on smoking, I would love for you to be off of cigarettes entirely by your surgery on the 19th.  I will make a note to myself to check in with you on the 20th about this. I am going to have our pharmacy team appeal your insurance's decision to stop paying for Ozempic.  If the appeal is denied, we will look for other potential options for you.  Pearla Dubonnet, MD

## 2022-09-18 LAB — COMPREHENSIVE METABOLIC PANEL
ALT: 18 IU/L (ref 0–32)
AST: 15 IU/L (ref 0–40)
Albumin/Globulin Ratio: 1.8 (ref 1.2–2.2)
Albumin: 4.1 g/dL (ref 3.8–4.9)
Alkaline Phosphatase: 87 IU/L (ref 44–121)
BUN/Creatinine Ratio: 13 (ref 9–23)
BUN: 11 mg/dL (ref 6–24)
Bilirubin Total: 0.4 mg/dL (ref 0.0–1.2)
CO2: 20 mmol/L (ref 20–29)
Calcium: 10.5 mg/dL — ABNORMAL HIGH (ref 8.7–10.2)
Chloride: 109 mmol/L — ABNORMAL HIGH (ref 96–106)
Creatinine, Ser: 0.82 mg/dL (ref 0.57–1.00)
Globulin, Total: 2.3 g/dL (ref 1.5–4.5)
Glucose: 83 mg/dL (ref 70–99)
Potassium: 4.6 mmol/L (ref 3.5–5.2)
Sodium: 144 mmol/L (ref 134–144)
Total Protein: 6.4 g/dL (ref 6.0–8.5)
eGFR: 84 mL/min/{1.73_m2} (ref >59)

## 2022-09-18 LAB — PARATHYROID HORMONE, INTACT (NO CA): PTH: 91 pg/mL — ABNORMAL HIGH (ref 15–65)

## 2022-09-18 NOTE — Assessment & Plan Note (Signed)
Has cut down to 1/2 pack/day.  Continues to follow with Dr. Valentina Lucks.  Target quit date of February 19. -Continue Chantix as she believes that is helping -Continue Wellbutrin 150 mg daily -I will follow-up with her on February 20 to see if she has had success in quitting -Follow-up with Dr. Valentina Lucks next week.

## 2022-09-18 NOTE — Assessment & Plan Note (Signed)
Not responding to nortriptyline.  Very likely this may be related to her hypercalcemia.  Workup as above. -Discontinue nortriptyline.

## 2022-09-18 NOTE — Assessment & Plan Note (Signed)
Has successfully lost 33lbs on Ozempic. Unfortunately insurance is now refusing to pay.  I continue to believe that this medication is the best option for her. -Will appeal insurance decision, will ask pharmacy team for assistance here.

## 2022-09-18 NOTE — Assessment & Plan Note (Signed)
Per chart review, seems this has been ongoing for a while now, has not previously been worked up.  Certainly warrants further workup as it is likely contributing to her lower extremity symptoms.  She is up-to-date on all of her cancer screenings.  Does not qualify for low-dose CT given that she only has a 19-year smoking history, just shy of the 20-pack-year cut off. -Will check a chemistry and a PTH

## 2022-09-21 ENCOUNTER — Other Ambulatory Visit (HOSPITAL_COMMUNITY): Payer: Self-pay

## 2022-09-21 ENCOUNTER — Telehealth: Payer: Self-pay

## 2022-09-21 NOTE — Telephone Encounter (Signed)
A Prior Authorization was initiated for this patients OZEMPIC through CoverMyMeds.   Key: [id number]  May have a previous denial. Attempting an appeal if denied.

## 2022-09-21 NOTE — Addendum Note (Signed)
Addended by: Jim Like B on: 09/21/2022 02:36 PM   Modules accepted: Orders

## 2022-09-24 ENCOUNTER — Ambulatory Visit: Payer: Medicare Other | Admitting: Pharmacist

## 2022-09-24 NOTE — Telephone Encounter (Signed)
Patient called, LVM and informed that forms are ready for pick up. Copy made and placed in batch scanning. Original placed at front desk for pick up.   Talbot Grumbling, RN

## 2022-09-25 ENCOUNTER — Other Ambulatory Visit (HOSPITAL_COMMUNITY): Payer: Self-pay

## 2022-09-25 ENCOUNTER — Ambulatory Visit: Payer: Medicare Other | Admitting: Pharmacist

## 2022-09-27 NOTE — Telephone Encounter (Signed)
Prior Auth for patients medication OZEMPIC denied by [facility] via CoverMyMeds.   Reason: DRUGS USED FOR ANOREXIA, WEIGHT LOSS, OR WEIGHT GAIN ARE EXCLUDED FROM COVERAGE UNDER MEDICARE RULES.  CoverMyMeds Key: [id number]

## 2022-10-02 ENCOUNTER — Telehealth: Payer: Self-pay | Admitting: Pharmacist

## 2022-10-02 NOTE — Telephone Encounter (Signed)
Noted and agree. 

## 2022-10-02 NOTE — Telephone Encounter (Signed)
Patient contacted for follow/up of missed appointment for  tobacco intake reduction / cessation.   Since last PCP  visit with Dr. Joelyn Oms, patient reports miminal reduction on intake from 1/2 ppd.  States 10-12 cigs per day currently.   Medications currently being used;  Varenicline - 0.'5mg'$  BID Patient denies any significant side effects.  Patient reports an upcoming surgery in the next few days on her foot/toe.   She also shared that she is awaiting a call related to neck surgery.   Offered support and availability for in-person visit when she is ready in the near-future.   She plans to schedule.  Total time with patient call and documentation of interaction: 8 minutes. Follow-up phone call planned: None

## 2022-10-09 DIAGNOSIS — H524 Presbyopia: Secondary | ICD-10-CM | POA: Diagnosis not present

## 2022-10-15 ENCOUNTER — Encounter: Payer: Self-pay | Admitting: Podiatry

## 2022-10-15 ENCOUNTER — Other Ambulatory Visit: Payer: Self-pay | Admitting: Podiatry

## 2022-10-15 DIAGNOSIS — M21272 Flexion deformity, left ankle and toes: Secondary | ICD-10-CM | POA: Diagnosis not present

## 2022-10-15 DIAGNOSIS — M21542 Acquired clubfoot, left foot: Secondary | ICD-10-CM | POA: Diagnosis not present

## 2022-10-15 DIAGNOSIS — M205X2 Other deformities of toe(s) (acquired), left foot: Secondary | ICD-10-CM | POA: Diagnosis not present

## 2022-10-15 MED ORDER — OXYCODONE-ACETAMINOPHEN 5-325 MG PO TABS: 1.0000 | ORAL_TABLET | ORAL | 0 refills | Status: DC | PRN

## 2022-10-15 MED ORDER — IBUPROFEN 800 MG PO TABS: 800.0000 mg | ORAL_TABLET | Freq: Four times a day (QID) | ORAL | 1 refills | Status: DC | PRN

## 2022-10-23 ENCOUNTER — Ambulatory Visit (INDEPENDENT_AMBULATORY_CARE_PROVIDER_SITE_OTHER): Payer: 59 | Admitting: Podiatry

## 2022-10-23 ENCOUNTER — Ambulatory Visit (INDEPENDENT_AMBULATORY_CARE_PROVIDER_SITE_OTHER): Payer: 59

## 2022-10-23 DIAGNOSIS — Z9889 Other specified postprocedural states: Secondary | ICD-10-CM

## 2022-10-23 DIAGNOSIS — M216X2 Other acquired deformities of left foot: Secondary | ICD-10-CM

## 2022-10-23 NOTE — Progress Notes (Signed)
Subjective:  Patient ID: Kendra [name], female    DOB: 1966/06/15,  MRN: [id number]  Chief Complaint  Patient presents with   Routine Post Op    POV#1 DOS 02.19.24 METATARSAL OSTEOTOMY 5TH LT    DOS: 10/15/2022 Procedure: Left fifth metatarsal osteotomy  57 y.o. female returns for post-op check.  Patient states she is doing well pain is controlled.  Denies any other acute complaints.  Bandages clean dry and intact  Review of Systems: Negative except as noted in the HPI. Denies N/V/F/Ch.  Past Medical History:  Diagnosis Date   Anemia    Asthma    Esophagitis    GERD (gastroesophageal reflux disease)    HTN (hypertension)    Hyperplastic colon polyp    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    Cor CTA 05/2019: Ca score 81.5 (96th percentile); mRCA < 25; dLM < 25; oLAD 25-49, pLAD < 25    Current Outpatient Medications:    acetaminophen (TYLENOL 8 HOUR) 650 MG CR tablet, Take 1 tablet (650 mg total) by mouth every 8 (eight) hours as needed for pain., Disp: 30 tablet, Rfl: 1   albuterol (PROAIR HFA) 108 (90 Base) MCG/ACT inhaler, inhale 2 puffs every 4 hours if needed wheezing or shortness of breath, Disp: 8.5 g, Rfl: 3   alum & mag hydroxide-simeth (MAALOX/MYLANTA) 200-200-20 MG/5ML suspension, Take 30 mLs by mouth every 6 (six) hours as needed for indigestion or heartburn. Reported on 10/26/2015, Disp: 355 mL, Rfl: 1   aspirin EC 81 MG tablet, Take 1 tablet (81 mg total) by mouth daily. Swallow whole., Disp: 90 tablet, Rfl: 3   buPROPion (WELLBUTRIN XL) 150 MG 24 hr tablet, Take 1 tablet (150 mg total) by mouth daily after breakfast., Disp: 30 tablet, Rfl: 6   ezetimibe (ZETIA) 10 MG tablet, TAKE 1 TABLET(10 MG) BY MOUTH DAILY, Disp: 90 tablet, Rfl: 1   furosemide (LASIX) 20 MG tablet, TAKE 1 TABLET(20 MG) BY MOUTH DAILY, Disp: 90 tablet, Rfl: 1   gabapentin (NEURONTIN) 300 MG capsule, Take 1 tablet ('300mg'$ ) once during the daytime and another at bedtime, Disp: 30 capsule, Rfl:  1   ibuprofen (ADVIL) 800 MG tablet, Take 1 tablet (800 mg total) by mouth every 6 (six) hours as needed., Disp: 60 tablet, Rfl: 1   icosapent Ethyl (VASCEPA) 1 g capsule, TAKE 2 CAPSULES(2 GRAMS) BY MOUTH TWICE DAILY, Disp: 360 capsule, Rfl: 1   meloxicam (MOBIC) 15 MG tablet, Take 1 tablet (15 mg total) by mouth daily., Disp: 30 tablet, Rfl: 0   oxyCODONE-acetaminophen (PERCOCET) 5-325 MG tablet, Take 1 tablet by mouth every 4 (four) hours as needed for severe pain., Disp: 30 tablet, Rfl: 0   OZEMPIC, 2 MG/DOSE, 8 MG/3ML SOPN, INJECT 2 MG INTO SKIN ONCE A WEEK, Disp: 9 mL, Rfl: 0   pantoprazole (PROTONIX) 40 MG tablet, Take 1 tablet (40 mg total) by mouth 2 (two) times daily before a meal., Disp: 120 tablet, Rfl: 1   REPATHA SURECLICK [id number] MG/ML SOAJ, ADMINISTER 1 ML UNDER THE SKIN EVERY 14 DAYS, Disp: 2 mL, Rfl: 11   rosuvastatin (CRESTOR) 20 MG tablet, Take 1 tablet (20 mg total) by mouth daily., Disp: 90 tablet, Rfl: 1   varenicline (CHANTIX) 0.5 MG tablet, Take 1 tablet (0.5 mg total) by mouth 2 (two) times daily. Start by taking 1 tablet by mouth once per day WITH A MEAL. Do this for one week. Then increase to 1 tablet twice per day WITH MEALS.,  Disp: 60 tablet, Rfl: 2  Social History   Tobacco Use  Smoking Status Every Day   Packs/day: 1.00   Types: Cigarettes   Start date: 08/27/2002  Smokeless Tobacco Never  Tobacco Comments   cut back to 4-5 cigs per day from 1ppd    Allergies  Allergen Reactions   Lisinopril Swelling   Objective:  There were no vitals filed for this visit. There is no height or weight on file to calculate BMI. Constitutional Well developed. Well nourished.  Vascular Foot warm and well perfused. Capillary refill normal to all digits.   Neurologic Normal speech. Oriented to person, place, and time. Epicritic sensation to light touch grossly present bilaterally.  Dermatologic Skin healing well without signs of infection. Skin edges well coapted without  signs of infection.  Orthopedic: Tenderness to palpation noted about the surgical site.   Radiographs: 3 views of skeletally mature the left foot: Good correction alignment noted.  Good osteotomy noted.  Reduction of submetatarsal 5 noted. Assessment:   1. Plantar flexed metatarsal bone of left foot   2. Status post foot surgery    Plan:  Patient was evaluated and treated and all questions answered.  S/p foot surgery left -Progressing as expected post-operatively. -XR: See above -WB Status: Weightbearing as tolerated in surgical shoe -Sutures: Intact.  No clinical signs of Deis is noted no complication noted. -Medications: None -Foot redressed.  No follow-ups on file.

## 2022-11-06 ENCOUNTER — Ambulatory Visit (INDEPENDENT_AMBULATORY_CARE_PROVIDER_SITE_OTHER): Payer: 59

## 2022-11-06 ENCOUNTER — Encounter: Payer: 59 | Admitting: Podiatry

## 2022-11-06 VITALS — BP 130/72 | HR 80

## 2022-11-06 DIAGNOSIS — M216X2 Other acquired deformities of left foot: Secondary | ICD-10-CM

## 2022-11-06 DIAGNOSIS — Z9889 Other specified postprocedural states: Secondary | ICD-10-CM

## 2022-11-06 NOTE — Progress Notes (Signed)
Patient presents today for post op visit # 2, patient of Dr. Posey Pronto.    POV#2 DOS 02.19.24 METATARSAL OSTEOTOMY 5TH LT    Sutures removed today without complication. Patient tolerated suture removal well. Incisions look good and no signs of infections.   Reviewed icing and elevation. Patient will follow up with Dr. Posey Pronto as needed.

## 2022-11-13 ENCOUNTER — Other Ambulatory Visit: Payer: Self-pay | Admitting: Student

## 2022-11-13 DIAGNOSIS — R0609 Other forms of dyspnea: Secondary | ICD-10-CM

## 2022-11-15 DIAGNOSIS — E213 Hyperparathyroidism, unspecified: Secondary | ICD-10-CM | POA: Diagnosis not present

## 2022-11-15 DIAGNOSIS — E210 Primary hyperparathyroidism: Secondary | ICD-10-CM | POA: Diagnosis not present

## 2022-11-23 DIAGNOSIS — E210 Primary hyperparathyroidism: Secondary | ICD-10-CM | POA: Diagnosis not present

## 2022-12-05 ENCOUNTER — Other Ambulatory Visit (HOSPITAL_COMMUNITY): Payer: Self-pay | Admitting: Surgery

## 2022-12-05 DIAGNOSIS — E210 Primary hyperparathyroidism: Secondary | ICD-10-CM

## 2022-12-05 DIAGNOSIS — E213 Hyperparathyroidism, unspecified: Secondary | ICD-10-CM

## 2022-12-06 ENCOUNTER — Other Ambulatory Visit (HOSPITAL_COMMUNITY): Payer: Self-pay

## 2022-12-17 ENCOUNTER — Telehealth: Payer: Self-pay

## 2022-12-17 NOTE — Telephone Encounter (Signed)
Patient calls nurse line in regards to Ozermpic.   Patient reports she has not been able to pick up this medication due to insurance.   Insurance will not cover Ozempic unless there is a hx of diabetes.   Will forward to PCP to send in an alternative.

## 2022-12-20 NOTE — Telephone Encounter (Signed)
Called and scheduled patient appt on May 10th 

## 2022-12-21 ENCOUNTER — Ambulatory Visit (HOSPITAL_COMMUNITY)
Admission: RE | Admit: 2022-12-21 | Discharge: 2022-12-21 | Disposition: A | Discharge status: Home / Self Care | Payer: 59 | Source: Ambulatory Visit | Attending: Surgery | Admitting: Surgery

## 2022-12-21 ENCOUNTER — Encounter (HOSPITAL_COMMUNITY)
Admission: RE | Admit: 2022-12-21 | Discharge: 2022-12-21 | Disposition: A | Discharge status: Home / Self Care | Payer: 59 | Source: Ambulatory Visit | Attending: Surgery | Admitting: Surgery

## 2022-12-21 DIAGNOSIS — E213 Hyperparathyroidism, unspecified: Secondary | ICD-10-CM

## 2022-12-21 DIAGNOSIS — E210 Primary hyperparathyroidism: Secondary | ICD-10-CM

## 2022-12-21 DIAGNOSIS — E041 Nontoxic single thyroid nodule: Secondary | ICD-10-CM | POA: Diagnosis not present

## 2022-12-21 DIAGNOSIS — D351 Benign neoplasm of parathyroid gland: Secondary | ICD-10-CM | POA: Diagnosis not present

## 2022-12-21 MED ORDER — TECHNETIUM TC 99M SESTAMIBI GENERIC - CARDIOLITE
25.8000 | Freq: Once | INTRAVENOUS | Status: AC | PRN
  Administered 2022-12-21: 25.8 via INTRAVENOUS

## 2022-12-24 ENCOUNTER — Ambulatory Visit: Payer: Self-pay | Admitting: Surgery

## 2022-12-24 NOTE — Progress Notes (Signed)
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  Vit D is low.  24 hour urine calcium is low normal.  Plan to proceed with outpatient parathyroidectomy.  Will enter orders and send to schedulers.  Tresa Endo - please notify patient as she is not on the portal.  tmg  Darnell Level, MD [facility] practice Office: [phone]

## 2023-01-02 NOTE — Patient Instructions (Signed)
SURGICAL WAITING ROOM VISITATION Patients having surgery or a procedure may have no more than 2 support people in the waiting area - these visitors may rotate in the visitor waiting room.   Due to an increase in RSV and influenza rates and associated hospitalizations, children ages 49 and under may not visit patients in [facility] hospitals. If the patient needs to stay at the hospital during part of their recovery, the visitor guidelines for inpatient rooms apply.  PRE-OP VISITATION  Pre-op nurse will coordinate an appropriate time for 1 support person to accompany the patient in pre-op.  This support person may not rotate.  This visitor will be contacted when the time is appropriate for the visitor to come back in the pre-op area.  Please refer to the [facility] website for the visitor guidelines for Inpatients (after your surgery is over and you are in a regular room).  You are not required to quarantine at this time prior to your surgery. However, you must do this: Hand Hygiene often Do NOT share personal items Notify your provider if you are in close contact with someone who has COVID or you develop fever 100.4 or greater, new onset of sneezing, cough, sore throat, shortness of breath or body aches.  If you test positive for Covid or have been in contact with anyone that has tested positive in the last 10 days please notify you surgeon.    Your procedure is scheduled on:  Thursday   Jan 10, 2023  Report to [facility] Main Entrance: Leota [facility]sen entrance where the [employer] is available.   Report to admitting at: 06:15    AM  +++++Call this number if you have any questions or problems the morning of surgery [phone]  Do not eat food after Midnight the night prior to your surgery/procedure.  After Midnight you may have the following liquids until  05:30 AM  DAY OF SURGERY  Clear Liquid Diet Water Black Coffee (sugar ok, NO MILK/CREAM OR CREAMERS)  Tea (sugar ok, NO  MILK/CREAM OR CREAMERS) regular and decaf                             Plain Jell-O  with no fruit (NO RED)                                           Fruit ices (not with fruit pulp, NO RED)                                     Popsicles (NO RED)                                                                  Juice: apple, WHITE grape, WHITE cranberry Sports drinks like Gatorade or Powerade (NO RED)                FOLLOW  ANY ADDITIONAL PRE OP INSTRUCTIONS YOU RECEIVED FROM YOUR SURGEON'S OFFICE!!!   Oral Hygiene is also important to reduce your risk of  infection.        Remember - BRUSH YOUR TEETH THE MORNING OF SURGERY WITH YOUR REGULAR TOOTHPASTE  Do NOT smoke after Midnight the night before surgery.  Take ONLY these medicines the morning of surgery with A SIP OF WATER: Pantoprazole (Protonix), Bupropion (Wellbutrin),  gabapentin.  You may take Tylenol OR Percocet for pain if needed.  You may use your Albuterol inhaler if needed.                    You may not have any metal on your body including hair pins, jewelry, and body piercing  Do not wear make-up, lotions, powders, perfumes or deodorant  Do not wear nail polish including gel and S&S, artificial / acrylic nails, or any other type of covering on natural nails including finger and toenails. If you have artificial nails, gel coating, etc., that needs to be removed by a nail salon, Please have this removed prior to surgery. Not doing so may mean that your surgery could be cancelled or delayed if the Surgeon or anesthesia staff feels like they are unable to monitor you safely.   Do not shave 48 hours prior to surgery to avoid nicks in your skin which may contribute to postoperative infections.   Patients discharged on the day of surgery will not be allowed to drive home.  Someone NEEDS to stay with you for the first 24 hours after anesthesia.  Do not bring your home medications to the hospital. The Pharmacy will dispense medications  listed on your medication list to you during your admission in the Hospital.  Special Instructions: Bring a copy of your healthcare power of attorney and living will documents the day of surgery, if you wish to have them scanned into your [place] [facility] Records- EPIC  Please read over the following fact sheets you were given: IF YOU HAVE QUESTIONS ABOUT YOUR PRE-OP INSTRUCTIONS, PLEASE CALL [phone].   [place] - Preparing for Surgery Before surgery, you can play an important role.  Because skin is not sterile, your skin needs to be as free of germs as possible.  You can reduce the number of germs on your skin by washing with CHG (chlorahexidine gluconate) soap before surgery.  CHG is an antiseptic cleaner which kills germs and bonds with the skin to continue killing germs even after washing. Please DO NOT use if you have an allergy to CHG or antibacterial soaps.  If your skin becomes reddened/irritated stop using the CHG and inform your nurse when you arrive at Short Stay. Do not shave (including legs and underarms) for at least 48 hours prior to the first CHG shower.  You may shave your face/neck.  Please follow these instructions carefully:  1.  Shower with CHG Soap the night before surgery and the  morning of surgery.  2.  If you choose to wash your hair, wash your hair first as usual with your normal  shampoo.  3.  After you shampoo, rinse your hair and body thoroughly to remove the shampoo.                             4.  Use CHG as you would any other liquid soap.  You can apply chg directly to the skin and wash.  Gently with a scrungie or clean washcloth.  5.  Apply the CHG Soap to your body ONLY FROM THE NECK DOWN.   Do not  use on face/ open                           Wound or open sores. Avoid contact with eyes, ears mouth and genitals (private parts).                       Wash face,  Genitals (private parts) with your normal soap.             6.  Wash thoroughly, paying  special attention to the area where your  surgery  will be performed.  7.  Thoroughly rinse your body with warm water from the neck down.  8.  DO NOT shower/wash with your normal soap after using and rinsing off the CHG Soap.            9.  Pat yourself dry with a clean towel.            10.  Wear clean pajamas.            11.  Place clean sheets on your bed the night of your first shower and do not  sleep with pets.  ON THE DAY OF SURGERY : Do not apply any lotions/deodorants the morning of surgery.  Please wear clean clothes to the hospital/surgery center.    FAILURE TO FOLLOW THESE INSTRUCTIONS MAY RESULT IN THE CANCELLATION OF YOUR SURGERY  PATIENT SIGNATURE_________________________________  NURSE SIGNATURE__________________________________  ________________________________________________________________________

## 2023-01-02 NOTE — Progress Notes (Signed)
COVID Vaccine received:  []  No [x]  Yes Date of any COVID positive Test in last 90 days:  PCP - Durel Salts, MD  At [facility]  Residents Cardiologist - Tonny Bollman, MD  Chest x-ray - 10-24-2020  2v  EPIC EKG -  [phone]  Epic Stress Test - Lexiscan  01-29-2018  Epic ECHO - 04-16-2019  Epic Cardiac Cath -  CTA Coronary- 05-2019 score- 81.5  nonobstructed CAD   PCR screen: []  Ordered & Completed           []   No Order but Needs PROFEND           [x]   N/A for this surgery  Surgery Plan:  [x]  Ambulatory                            []  Outpatient in bed                            []  Admit  Anesthesia:    [x]  General  []  Spinal                           []   Choice []   MAC  Pacemaker / ICD device [x]  No []  Yes   Spinal Cord Stimulator:[x]  No []  Yes       History of Sleep Apnea? [x]  No []  Yes   CPAP used?- [x]  No []  Yes    Does the patient monitor blood sugar?          []  No []  Yes  [x]  N/A  Patient has: [x]  NO Hx DM   []  Pre-DM                 []  DM1  []   DM2 Does patient have a [employer] or Dexacom? []  No []  Yes   Fasting Blood Sugar Ranges-  Checks Blood Sugar _____ times a day  GLP1 agonist / usual dose - Ozempic  for weight loss, on Hold, insurance dropped coverage. GLP1 instructions: none  Blood Thinner / Instructions:  None Aspirin Instructions:  ASA 81 mg  ERAS Protocol Ordered: []  No  [x]  Yes PRE-SURGERY []  ENSURE  []  G2  [x]  No Drink Ordered Patient is to be NPO after: 05:30  am  Comments:   Activity level: Patient is able / unable to climb a flight of stairs without difficulty; []  No CP  []  No SOB, but would have ___   Patient can / can not perform ADLs without assistance.   Anesthesia review: Nonobstructive CAD, tachycardia, anemia, asthma, HTN, Smoker, Hemangioma of Liver, GERD  Patient denies shortness of breath, fever, cough and chest pain at PAT appointment.  Patient verbalized understanding and agreement to the  Pre-Surgical Instructions that were given to them at this PAT appointment. Patient was also educated of the need to review these PAT instructions again prior to her surgery.I reviewed the appropriate phone numbers to call if they have any and questions or concerns.

## 2023-01-03 ENCOUNTER — Other Ambulatory Visit: Payer: Self-pay

## 2023-01-03 ENCOUNTER — Encounter (HOSPITAL_COMMUNITY): Payer: Self-pay

## 2023-01-03 ENCOUNTER — Encounter (HOSPITAL_COMMUNITY)
Admission: RE | Admit: 2023-01-03 | Discharge: 2023-01-03 | Disposition: A | Discharge status: Home / Self Care | Payer: 59 | Source: Ambulatory Visit | Attending: Surgery | Admitting: Surgery

## 2023-01-03 VITALS — BP 114/76 | HR 94 | Temp 98.5°F | Resp 20 | Ht 64.0 in | Wt 262.0 lb

## 2023-01-03 DIAGNOSIS — Z01818 Encounter for other preprocedural examination: Secondary | ICD-10-CM | POA: Insufficient documentation

## 2023-01-03 DIAGNOSIS — I10 Essential (primary) hypertension: Secondary | ICD-10-CM | POA: Insufficient documentation

## 2023-01-03 DIAGNOSIS — D1803 Hemangioma of intra-abdominal structures: Secondary | ICD-10-CM | POA: Insufficient documentation

## 2023-01-03 DIAGNOSIS — E210 Primary hyperparathyroidism: Secondary | ICD-10-CM | POA: Diagnosis not present

## 2023-01-03 HISTORY — DX: Cardiac arrhythmia, unspecified: I49.9

## 2023-01-03 HISTORY — DX: Nicotine dependence, cigarettes, uncomplicated: F17.210

## 2023-01-03 HISTORY — DX: Hemangioma of intra-abdominal structures: D18.03

## 2023-01-03 LAB — COMPREHENSIVE METABOLIC PANEL
ALT: 25 U/L (ref 0–44)
AST: 21 U/L (ref 15–41)
Albumin: 3.9 g/dL (ref 3.5–5.0)
Alkaline Phosphatase: 69 U/L (ref 38–126)
Anion gap: 8 (ref 5–15)
BUN: 10 mg/dL (ref 6–20)
CO2: 24 mmol/L (ref 22–32)
Calcium: 10.2 mg/dL (ref 8.9–10.3)
Chloride: 105 mmol/L (ref 98–111)
Creatinine, Ser: 0.82 mg/dL (ref 0.44–1.00)
GFR, Estimated: >60 mL/min (ref >60)
Glucose, Bld: 102 mg/dL — ABNORMAL HIGH (ref 70–99)
Potassium: 4.1 mmol/L (ref 3.5–5.1)
Sodium: 137 mmol/L (ref 135–145)
Total Bilirubin: 0.6 mg/dL (ref 0.3–1.2)
Total Protein: 7.2 g/dL (ref 6.5–8.1)

## 2023-01-03 LAB — CBC
HCT: 43.8 % (ref 36.0–46.0)
Hemoglobin: 14.7 g/dL (ref 12.0–15.0)
MCH: 33.6 pg (ref 26.0–34.0)
MCHC: 33.6 g/dL (ref 30.0–36.0)
MCV: 100 fL (ref 80.0–100.0)
Platelets: 259 10*3/uL (ref 150–400)
RBC: 4.38 MIL/uL (ref 3.87–5.11)
RDW: 13.2 % (ref 11.5–15.5)
WBC: 6.3 10*3/uL (ref 4.0–10.5)
nRBC: 0 % (ref 0.0–0.2)

## 2023-01-04 ENCOUNTER — Other Ambulatory Visit: Payer: Self-pay

## 2023-01-04 ENCOUNTER — Telehealth: Payer: Self-pay | Admitting: *Deleted

## 2023-01-04 ENCOUNTER — Encounter: Payer: Self-pay | Admitting: Student

## 2023-01-04 ENCOUNTER — Ambulatory Visit (INDEPENDENT_AMBULATORY_CARE_PROVIDER_SITE_OTHER): Payer: 59 | Admitting: Student

## 2023-01-04 VITALS — BP 121/80 | HR 86 | Ht 64.0 in | Wt 266.8 lb

## 2023-01-04 DIAGNOSIS — M7989 Other specified soft tissue disorders: Secondary | ICD-10-CM

## 2023-01-04 DIAGNOSIS — G8929 Other chronic pain: Secondary | ICD-10-CM | POA: Diagnosis not present

## 2023-01-04 DIAGNOSIS — K219 Gastro-esophageal reflux disease without esophagitis: Secondary | ICD-10-CM | POA: Diagnosis not present

## 2023-01-04 DIAGNOSIS — M545 Low back pain, unspecified: Secondary | ICD-10-CM

## 2023-01-04 DIAGNOSIS — I2510 Atherosclerotic heart disease of native coronary artery without angina pectoris: Secondary | ICD-10-CM | POA: Diagnosis not present

## 2023-01-04 DIAGNOSIS — F17200 Nicotine dependence, unspecified, uncomplicated: Secondary | ICD-10-CM | POA: Diagnosis not present

## 2023-01-04 DIAGNOSIS — E782 Mixed hyperlipidemia: Secondary | ICD-10-CM | POA: Diagnosis not present

## 2023-01-04 DIAGNOSIS — Z6841 Body Mass Index (BMI) 40.0 and over, adult: Secondary | ICD-10-CM

## 2023-01-04 MED ORDER — GABAPENTIN 300 MG PO CAPS
ORAL_CAPSULE | ORAL | 1 refills | Status: DC
Start: 2023-01-04 — End: 2023-07-04

## 2023-01-04 MED ORDER — FUROSEMIDE 20 MG PO TABS: 20.0000 mg | ORAL_TABLET | Freq: Every day | ORAL | 1 refills | Status: DC

## 2023-01-04 MED ORDER — BUPROPION HCL ER (XL) 150 MG PO TB24: 150.0000 mg | ORAL_TABLET | Freq: Every day | ORAL | 3 refills | Status: DC

## 2023-01-04 MED ORDER — REPATHA SURECLICK 140 MG/ML ~~LOC~~ SOAJ: SUBCUTANEOUS | 11 refills | Status: DC

## 2023-01-04 MED ORDER — NALTREXONE HCL 50 MG PO TABS: 25.0000 mg | ORAL_TABLET | Freq: Every day | ORAL | 1 refills | Status: DC

## 2023-01-04 MED ORDER — ROSUVASTATIN CALCIUM 20 MG PO TABS: 20.0000 mg | ORAL_TABLET | Freq: Every day | ORAL | 1 refills | Status: DC

## 2023-01-04 MED ORDER — VARENICLINE TARTRATE 0.5 MG PO TABS
ORAL_TABLET | ORAL | 2 refills | Status: DC
Start: 2023-01-04 — End: 2023-02-26

## 2023-01-04 MED ORDER — PANTOPRAZOLE SODIUM 40 MG PO TBEC: 40.0000 mg | DELAYED_RELEASE_TABLET | Freq: Two times a day (BID) | ORAL | 1 refills | Status: DC

## 2023-01-04 NOTE — Telephone Encounter (Signed)
   Pre-operative Risk Assessment    Patient Name: Kendra [name]  DOB: 09-22-1965 MRN: [id number]      Request for Surgical Clearance    Procedure:   PARATHYROIDECTOMY  Date of Surgery:  Clearance 01/10/23                                 Surgeon:  Darnell Level, MD Surgeon's Group or Practice Name:  [facility] Phone number:  [phone] Fax number:  [phone]   Type of Clearance Requested:   - Medical  - Pharmacy:  Hold Aspirin NOT INDICATED HOW LONG   Type of Anesthesia:  General    Additional requests/questions:    Wilhemina Cash   01/04/2023, 2:09 PM

## 2023-01-04 NOTE — Progress Notes (Unsigned)
SUBJECTIVE:   CHIEF COMPLAINT / HPI:   Weight loss efforts Previously on Ozempic and doing quite well.  Had lost a total of 33 pounds.  However, lost insurance coverage on January 1 of this year.  She is obviously disappointed from this as she was having minimal side effects.  I attempted to appeal this decision with her insurance unsuccessfully.  Impressively, however, she has continued to lose weight despite being off of the Ozempic.  Is now down a total of 49 pounds. She is wondering if there are any other medications that might be an option for her.  Of note, she is already on Wellbutrin for mood and tobacco cravings.   Tobacco Abuse At our last visit considered a target stop date of February 19.  However, continues to smoke about half a pack per day.  Has been on both Chantix and Wellbutrin to help control cravings.  Willing to try additional medication assistance to help her with stopping.  Continues to voice motivation for quitting.  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  Has surgery scheduled on 5/16 with Dr. Gerrit Friends for removal.   OBJECTIVE:   BP 121/80   Pulse 86   Ht 5\' 4"  (1.626 m)   Wt 266 lb 12.8 oz (121 kg)   LMP 02/21/2015   SpO2 97%   BMI 45.80 kg/m   Physical Exam Vitals reviewed.  Constitutional:      General: She is not in acute distress.    Comments: In good spirits  Cardiovascular:     Rate and Rhythm: Normal rate and regular rhythm.     Heart sounds: No murmur heard. Abdominal:     General: There is no distension.     Tenderness: There is no abdominal tenderness.  Musculoskeletal:        General: No swelling or deformity.  Neurological:     Comments: Antalgic gait, secondary to knee pain  Psychiatric:        Mood and Affect: Mood normal.      ASSESSMENT/PLAN:   BMI 45.0-49.9, adult (HCC) Expressed joy that she has continued to lose weight despite being off of the Ozempic for the past 4 to 5 months.  She has been doing so  through mostly dietary interventions.  Exercise is limited by her bilateral knee pain.  She is hopeful to have surgery if she can get her weight below a BMI of 40.  Discussed Contrave as a possible option, unfortunately it appears this is not going to be covered by her insurance.  Note that she is already on the Wellbutrin component of this.  I think it is reasonable to try an addition of naltrexone to concomitantly treat her obesity and her ongoing tobacco use/tobacco cravings. -Adding naltrexone 25 mg daily -Continue Wellbutrin 150 mg daily -She is open to discussion of possible bariatric surgery at our next visit, I do think that she would potentially be a very good candidate for this.  Though ultimately tobacco cessation is one of the best things that she can do to improve her chances of a successful surgery  TOBACCO ABUSE Continues to smoke 1/2 pack/day.  Adding naltrexone as above. 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  Has parathyroidectomy scheduled with Dr. Gerrit Friends on 5/16.     Eliezer Mccoy, MD [facility]  

## 2023-01-04 NOTE — Telephone Encounter (Signed)
Pt has scheduled for a tele visit, 12/29/22 10:40.  Consent on file / medications reconciled.

## 2023-01-04 NOTE — Telephone Encounter (Signed)
Pt has scheduled for a tele visit, 12/29/22 10:40.  Consent on file / medications reconciled.    Patient Consent for Virtual Visit        Kendra [name] has provided verbal consent on 01/04/2023 for a virtual visit (video or telephone).   CONSENT FOR VIRTUAL VISIT FOR:  Kendra [name]  By participating in this virtual visit I agree to the following:  I hereby voluntarily request, consent and authorize [facility] and its employed or contracted physicians, physician assistants, nurse practitioners or other licensed health care professionals (the Practitioner), to provide me with telemedicine health care services (the "Services") as deemed necessary by the treating Practitioner. I acknowledge and consent to receive the Services by the Practitioner via telemedicine. I understand that the telemedicine visit will involve communicating with the Practitioner through live audiovisual communication technology and the disclosure of certain medical information by electronic transmission. I acknowledge that I have been given the opportunity to request an in-person assessment or other available alternative prior to the telemedicine visit and am voluntarily participating in the telemedicine visit.  I understand that I have the right to withhold or withdraw my consent to the use of telemedicine in the course of my care at any time, without affecting my right to future care or treatment, and that the Practitioner or I may terminate the telemedicine visit at any time. I understand that I have the right to inspect all information obtained and/or recorded in the course of the telemedicine visit and may receive copies of available information for a reasonable fee.  I understand that some of the potential risks of receiving the Services via telemedicine include:  Delay or interruption in medical evaluation due to technological equipment failure or disruption; Information transmitted may not be sufficient (e.g.  poor resolution of images) to allow for appropriate medical decision making by the Practitioner; and/or  In rare instances, security protocols could fail, causing a breach of personal health information.  Furthermore, I acknowledge that it is my responsibility to provide information about my medical history, conditions and care that is complete and accurate to the best of my ability. I acknowledge that Practitioner's advice, recommendations, and/or decision may be based on factors not within their control, such as incomplete or inaccurate data provided by me or distortions of diagnostic images or specimens that may result from electronic transmissions. I understand that the practice of medicine is not an exact science and that Practitioner makes no warranties or guarantees regarding treatment outcomes. I acknowledge that a copy of this consent can be made available to me via my patient portal [facility] MyChart), or I can request a printed copy by calling the office of [facility].    I understand that my insurance will be billed for this visit.   I have read or had this consent read to me. I understand the contents of this consent, which adequately explains the benefits and risks of the Services being provided via telemedicine.  I have been provided ample opportunity to ask questions regarding this consent and the Services and have had my questions answered to my satisfaction. I give my informed consent for the services to be provided through the use of telemedicine in my medical care

## 2023-01-04 NOTE — Progress Notes (Signed)
Anesthesia Chart Review   Case: [id number] Date/Time: 01/10/23 0815   Procedure: RIGHT SUPERIOR PARATHYROIDECTOMY (Right) - 90   Anesthesia type: General   Pre-op diagnosis: PRIMARY HYPERPARATHYROIDISM   Location: [facility] ROOM 01 / [facility]   Surgeons: Darnell Level, MD       DISCUSSION:57 y.o. smoker with h/o GERD, HTN, nonobstructive CAD, primary hyperparathyroidism scheduled for above procedure 01/09/2023 with Dr. Darnell Level.  Myoview 6/19: Low Risk, no ischemia  Coronary CTA 05/2019: Ca score 81.5 (96th percentile); minimal plaque (<25%) in mRCA, dLM and pLAD; mild plaque (25-49%) in oLAD  Patient last seen by cardiology 05/08/2022.  Echo ordered at this visit due to shortness of breath.  It does not appear that this echo has been done.  Will request cardiac clearance. Spoke with triage nurse at [facility] 01/04/2023.    VS: BP 114/76 Comment: right arm sitting  Pulse 94 Comment: known tachycardia  Temp 36.9 C (Oral)   Resp 20   Ht 5\' 4"  (1.626 m)   Wt 118.8 kg   LMP 02/21/2015   SpO2 98%   BMI 44.97 kg/m   PROVIDERS: Alicia Amel, MD is PCP  Cardiologist - Tonny Bollman, MD  LABS: Labs reviewed: Acceptable for surgery. (all labs ordered are listed, but only abnormal results are displayed)  Labs Reviewed  COMPREHENSIVE METABOLIC PANEL - Abnormal; Notable for the following components:      Result Value   Glucose, Bld 102 (*)    All other components within normal limits  CBC     IMAGES:   EKG:   CV: Echo 04/16/2019 1. The left ventricle has normal systolic function with an ejection  fraction of 60-65%. The cavity size was normal. Left ventricular diastolic  parameters were normal.   2. The right ventricle has normal systolic function. The cavity was  normal. There is no increase in right ventricular wall thickness.   3. No evidence of mitral valve stenosis.   4. No stenosis of the aortic valve.   5. The aorta is normal unless otherwise noted.   6. The aortic root  and ascending aorta are normal in size and structure.   7. The atrial septum is grossly normal.  Myocardial perfusion 01/29/2018 Nuclear stress EF: 60%. There is a small defect of mild severity present in the mid anteroseptal and apical septal location. The defect is non-reversible and consistent with breast attenuation artifact. No ischemia noted This is a low risk study. The left ventricular ejection fraction is normal (55-65%). There was no ST segment deviation noted during stress. Past Medical History:  Diagnosis Date   Anemia    Asthma    Cigarette smoker    Dysrhythmia    Tachycardia   Esophagitis    GERD (gastroesophageal reflux disease)    Hemangioma of liver    HTN (hypertension)    Hyperplastic colon polyp    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    Cor CTA 05/2019: Ca score 81.5 (96th percentile); mRCA < 25; dLM < 25; oLAD 25-49, pLAD < 25    Past Surgical History:  Procedure Laterality Date   ABDOMINAL HYSTERECTOMY  04/12/2015   ANKLE SURGERY Left    CESAREAN SECTION     CYSTOSCOPY N/A 04/12/2015   Procedure: CYSTOSCOPY;  Surgeon: Willodean Rosenthal, MD;  Location: [facility];  Service: Gynecology;  Laterality: N/A;   FINGER SURGERY Right    3rd and 4th fingers   TIBIA FRACTURE SURGERY Left    ankle ORIF-  hardware has been removed though   TOE OSTEOTOMY Left    5th toe   TUBAL LIGATION      MEDICATIONS:  acetaminophen (TYLENOL 8 HOUR) 650 MG CR tablet   albuterol (VENTOLIN HFA) 108 (90 Base) MCG/ACT inhaler   alum & mag hydroxide-simeth (MAALOX/MYLANTA) 200-200-20 MG/5ML suspension   aspirin EC 81 MG tablet   buPROPion (WELLBUTRIN XL) 150 MG 24 hr tablet   ezetimibe (ZETIA) 10 MG tablet   furosemide (LASIX) 20 MG tablet   gabapentin (NEURONTIN) 300 MG capsule   ibuprofen (ADVIL) 800 MG tablet   icosapent Ethyl (VASCEPA) 1 g capsule   meloxicam (MOBIC) 15 MG tablet   oxyCODONE-acetaminophen (PERCOCET) 5-325 MG tablet   OZEMPIC, 2 MG/DOSE, 8 MG/3ML  SOPN   pantoprazole (PROTONIX) 40 MG tablet   REPATHA SURECLICK 140 MG/ML SOAJ   rosuvastatin (CRESTOR) 20 MG tablet   varenicline (CHANTIX) 0.5 MG tablet   No current facility-administered medications for this encounter.   Jodell Cipro Ward, PA-C [facility]-Surgical Testing [phone]

## 2023-01-04 NOTE — Telephone Encounter (Signed)
   Name: Kendra [name]  DOB: 06-27-1966  MRN: [id number]  Primary Cardiologist: Tonny Bollman, MD  Chart reviewed as part of pre-operative protocol coverage. Because of [name]'s past medical history and time since last visit, she will require a follow-up telephone visit in order to better assess preoperative cardiovascular risk.  Pre-op covering staff: - Please schedule appointment and call patient to inform them. If patient already had an upcoming appointment within acceptable timeframe, please add "pre-op clearance" to the appointment notes so provider is aware. - Please contact requesting surgeon's office via preferred method (i.e, phone, fax) to inform them of need for appointment prior to surgery.   Patient with hx of CAD. Typically a 5-7 day hold is indicated for ASA.   [name], PA-C  01/04/2023, 2:18 PM

## 2023-01-04 NOTE — Patient Instructions (Addendum)
We are adding a medication called Naltrexone that can be helpful with cravings. Hopefully this will help with both food and cigarette cravings. Start with 1/2 pill daily.  I will see you in a month.  Eliezer Mccoy, MD

## 2023-01-06 ENCOUNTER — Encounter (HOSPITAL_COMMUNITY): Payer: Self-pay | Admitting: Surgery

## 2023-01-06 DIAGNOSIS — E210 Primary hyperparathyroidism: Secondary | ICD-10-CM | POA: Diagnosis present

## 2023-01-06 NOTE — H&P (Signed)
REFERRING PHYSICIAN: Levert Feinstein  PROVIDER: Stephan Nelis Myra Rude, MD   Chief Complaint: New Consultation (Hypercalcemia, hyperparathyroidism)  History of Present Illness:  Patient is referred from the [facility] internal medicine program for surgical evaluation of suspected primary hyperparathyroidism. Patient was noted on routine laboratory studies to have an elevated serum calcium level. Most recent laboratory showed a calcium of 10.6 and an elevated intact PTH level of 91. Patient has not had any imaging studies. She complains of fatigue. She notes bone and joint discomfort. She has not had a bone density scan. There is no family history of parathyroid disease. Patient has had no prior head or neck surgery. Patient presents today to discuss further evaluation.  Review of Systems: A complete review of systems was obtained from the patient. I have reviewed this information and discussed as appropriate with the patient. See HPI as well for other ROS.  Review of Systems Constitutional: Positive for malaise/fatigue. HENT: Negative. Eyes: Negative. Respiratory: Negative. Cardiovascular: Negative. Gastrointestinal: Negative. Genitourinary: Negative. Musculoskeletal: Positive for joint pain. Skin: Negative. Neurological: Negative. Endo/Heme/Allergies: Negative. Psychiatric/Behavioral: Negative.   Medical History: Past Medical History: Diagnosis Date Arthritis Asthma, unspecified asthma severity, unspecified whether complicated, unspecified whether persistent  Patient Active Problem List Diagnosis Primary hyperparathyroidism (CMS-HCC)  Past Surgical History: Procedure Laterality Date HYSTERECTOMY   Allergies Allergen Reactions Lisinopril Swelling  Current Outpatient Medications on File Prior to Visit Medication Sig Dispense Refill acetaminophen (TYLENOL) 650 MG ER tablet Take 650 mg by mouth every 8 (eight) hours as needed albuterol MDI, PROVENTIL,  VENTOLIN, PROAIR, HFA 90 mcg/actuation inhaler Inhale 2 inhalations into the lungs every 4 (four) hours as needed aluminum-magnesium hydroxide-simethicone, DRAH, (MI-ACID) 200-200-20 mg/5 mL suspension Take by mouth aspirin 81 MG EC tablet Take by mouth buPROPion (WELLBUTRIN XL) 150 MG XL tablet Take by mouth ezetimibe (ZETIA) 10 mg tablet Take 10 mg by mouth once daily FUROsemide (LASIX) 20 MG tablet Take 20 mg by mouth once daily gabapentin (NEURONTIN) 300 MG capsule Take 1 tablet (300mg ) once during the daytime and another at bedtime ibuprofen (MOTRIN) 800 MG tablet Take by mouth icosapent ethyL (VASCEPA) 1 gram capsule TAKE 2 CAPSULES(2 GRAMS) BY MOUTH TWICE DAILY meloxicam (MOBIC) 15 MG tablet Take 1 tablet by mouth once daily OZEMPIC 2 mg/dose (8 mg/3 mL) pen injector INJECT 2 MG INTO SKIN ONCE A WEEK varenicline (CHANTIX) 0.5 MG tablet Take by mouth  No current facility-administered medications on file prior to visit.  Family History Problem Relation Age of Onset High blood pressure (Hypertension) Mother Diabetes Mother   Social History  Tobacco Use Smoking Status Former Types: Cigarettes Smokeless Tobacco Never   Social History  Socioeconomic History Marital status: Single Tobacco Use Smoking status: Former Types: Cigarettes Smokeless tobacco: Never Vaping Use Vaping Use: Never used Substance and Sexual Activity Drug use: Never  Objective:  Vitals: BP: (!) 140/80 Pulse: (!) 112 Temp: 36.6 C (97.9 F) SpO2: 99% Weight: (!) 123.3 kg (271 lb 12.8 oz) Height: 162.6 cm (5\' 4" ) PainSc: 8  Body mass index is 46.65 kg/m.  Physical Exam  GENERAL APPEARANCE Comfortable, no acute issues Development: normal Gross deformities: none  SKIN Rash, lesions, ulcers: none Induration, erythema: none Nodules: none palpable  EYES Conjunctiva and lids: normal Pupils: equal and reactive  EARS, NOSE, MOUTH, THROAT External ears: no lesion or deformity External  nose: no lesion or deformity Hearing: grossly normal  NECK Symmetric: yes Trachea: midline Thyroid: no palpable nodules in the thyroid bed  CHEST  Respiratory effort: normal Retraction or accessory muscle use: no Breath sounds: normal bilaterally Rales, rhonchi, wheeze: none  CARDIOVASCULAR Auscultation: regular rhythm, normal rate Murmurs: none Pulses: radial pulse 2+ palpable Lower extremity edema: Mild bilateral ankle edema  ABDOMEN Not assessed  GENITOURINARY/RECTAL Not assessed  MUSCULOSKELETAL Station and gait: normal Digits and nails: no clubbing or cyanosis Muscle strength: grossly normal all extremities Range of motion: grossly normal all extremities Deformity: none  LYMPHATIC Cervical: none palpable Supraclavicular: none palpable  PSYCHIATRIC Oriented to person, place, and time: yes Mood and affect: normal for situation Judgment and insight: appropriate for situation   Assessment and Plan:  Primary hyperparathyroidism (CMS-HCC)  Patient is referred by her primary care provider for surgical evaluation and recommendations regarding suspected primary hyperparathyroidism.  Patient provided with a copy of "Parathyroid Surgery: Treatment for Your Parathyroid Gland Problem", published by [employer], 12 pages. Book reviewed and explained to patient during visit today.  Today we reviewed her clinical history. We reviewed her laboratory studies. We discussed parathyroid disease and parathyroid surgery.  I would like to obtain a 25-hydroxy vitamin D level. We will also request a 24-hour urine collection for calcium. I explained the studies to the patient. We will also arrange for an ultrasound examination of the neck as well as a nuclear medicine parathyroid scan with sestamibi. Once the results of all of the studies are available, I will contact the patient and we will discuss further management. Today we did spend some time discussing minimally invasive parathyroid  surgery. The patient understands and agrees to proceed with further diagnostic testing.  Patient will undergo the above studies. We will contact her with the results when they are available and make plans for further management.  Darnell Level, MD [facility] practice Office: [phone]

## 2023-01-07 NOTE — Assessment & Plan Note (Signed)
Expressed joy that she has continued to lose weight despite being off of the Ozempic for the past 4 to 5 months.  She has been doing so through mostly dietary interventions.  Exercise is limited by her bilateral knee pain.  She is hopeful to have surgery if she can get her weight below a BMI of 40.  Discussed Contrave as a possible option, unfortunately it appears this is not going to be covered by her insurance.  Note that she is already on the Wellbutrin component of this.  I think it is reasonable to try an addition of naltrexone to concomitantly treat her obesity and her ongoing tobacco use/tobacco cravings. -Adding naltrexone 25 mg daily -Continue Wellbutrin 150 mg daily -She is open to discussion of possible bariatric surgery at our next visit, I do think that she would potentially be a very good candidate for this.  Though ultimately tobacco cessation is one of the best things that she can do to improve her chances of a successful surgery

## 2023-01-07 NOTE — Assessment & Plan Note (Addendum)
Continues to smoke 1/2 pack/day.  Adding naltrexone as above. -Naltrexone 25 mg daily -Wellbutrin 150 mg daily -Restarting Chantix, as she has been off of it for several months, will need to restart the introductory dosing

## 2023-01-07 NOTE — Assessment & Plan Note (Signed)
Secondary to parathyroid adenoma.  Has parathyroidectomy scheduled with Dr. Gerrit Friends on 5/16.

## 2023-01-08 ENCOUNTER — Ambulatory Visit: Payer: 59 | Attending: Cardiology

## 2023-01-08 DIAGNOSIS — Z01810 Encounter for preprocedural cardiovascular examination: Secondary | ICD-10-CM

## 2023-01-08 NOTE — Telephone Encounter (Signed)
Pt ask that someone call her back to r/s tele visit.

## 2023-01-08 NOTE — Telephone Encounter (Signed)
I called the pt back to reschedule her tele appt that she missed today at 10:40. Pt said her cell phone was "messed up" this morning and she just got it fixed. Pt states her surgery is still 01/10/23. I told all I can do is ask the pre op APP what she wants to do. I said I will have to call her back as the pre op APP was on the phone with the 3 pm pt.  Pre op APP replied to my message and she will have to call the pt back around 3:30.   I will call the pt and let pt know. Pt made aware pre op APP will call shortly.

## 2023-01-08 NOTE — Progress Notes (Signed)
Virtual Visit via Telephone Note   Because of Kendra [name]'s co-morbid illnesses, she is at least at moderate risk for complications without adequate follow up.  This format is felt to be most appropriate for this patient at this time.  The patient did not have access to video technology/had technical difficulties with video requiring transitioning to audio format only (telephone).  All issues noted in this document were discussed and addressed.  No physical exam could be performed with this format.  Please refer to the patient's chart for her consent to telehealth for Kendra [name].  Evaluation Performed:  Preoperative cardiovascular risk assessment _____________   Date:  01/08/2023   Patient ID:  Kendra [name], DOB [birth year], MRN [id number] Patient Location:  Home Provider location:   Office  Primary Care Provider:  Alicia Amel, MD Primary Cardiologist:  Tonny Bollman, MD  Chief Complaint / Patient Profile   57 y.o. y/o female with a h/o chest pain, nonobstructive coronary artery disease on coronary CTA 05/2019, tobacco use, hyperlipidemia, and GERD who is pending right superior parathyroidectomy and presents today for telephonic preoperative cardiovascular risk assessment.  History of Present Illness    Kendra [name] is a 57 y.o. female who presents via audio/video conferencing for a telehealth visit today.  Pt was last seen in cardiology clinic on 05/08/2022 by Tereso Newcomer, PA-C.  At that time Kendra [name] was doing well.  The patient is now pending procedure as outlined above. Since her last visit, she tells me that she has not had any big changes with her heart.  She does struggle with chronic dyspnea on exertion.  She also has had chronic chest pain that is typically on the right side of her chest.  She tells me that she stopped her aspirin about a month ago because she ran out of her medication.  Her primary care Dr. Marisue Humble refilled for her.  She states that  her activity is limited by needing to knee replacements, some hip pain and back pain.  She does do all of her indoor cleaning though and also likes to plant flowers in her yard.  She has scored above the 4 METS on the DASI.  She can hold her aspirin x 5 days prior to the procedure and restart when medically safe to do so.  Past Medical History    Past Medical History:  Diagnosis Date   Anemia    Asthma    Cigarette smoker    Dysrhythmia    Tachycardia   Esophagitis    GERD (gastroesophageal reflux disease)    Hemangioma of liver    HTN (hypertension)    Hyperplastic colon polyp    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    Cor CTA 05/2019: Ca score 81.5 (96th percentile); mRCA < 25; dLM < 25; oLAD 25-49, pLAD < 25   Past Surgical History:  Procedure Laterality Date   ABDOMINAL HYSTERECTOMY  04/12/2015   ANKLE SURGERY Left    CESAREAN SECTION     CYSTOSCOPY N/A 04/12/2015   Procedure: CYSTOSCOPY;  Surgeon: Willodean Rosenthal, MD;  Location: [facility];  Service: Gynecology;  Laterality: N/A;   FINGER SURGERY Right    3rd and 4th fingers   TIBIA FRACTURE SURGERY Left    ankle ORIF-  hardware has been removed though   TOE OSTEOTOMY Left    5th toe   TUBAL LIGATION      Allergies  Allergies  Allergen Reactions   Lisinopril  Swelling    Home Medications    Prior to Admission medications   Medication Sig Start Date End Date Taking? Authorizing Provider  acetaminophen (TYLENOL 8 HOUR) 650 MG CR tablet Take 1 tablet (650 mg total) by mouth every 8 (eight) hours as needed for pain. 01/04/20   Marthenia Rolling, DO  albuterol (VENTOLIN HFA) 108 (90 Base) MCG/ACT inhaler INHALE 2 PUFFS BY MOUTH EVERY 4 HOURS AS NEEDED FOR WHEEZING OR SHORTNESS OF BREATH 11/13/22   Alicia Amel, MD  alum & mag hydroxide-simeth (MAALOX/MYLANTA) 200-200-20 MG/5ML suspension Take 30 mLs by mouth every 6 (six) hours as needed for indigestion or heartburn. Reported on 10/26/2015 01/04/20   Marthenia Rolling,  DO  aspirin EC 81 MG tablet Take 1 tablet (81 mg total) by mouth daily. Swallow whole. 12/21/20   Tereso Newcomer T, PA-C  buPROPion (WELLBUTRIN XL) 150 MG 24 hr tablet Take 1 tablet (150 mg total) by mouth daily after breakfast. 01/04/23   Alicia Amel, MD  Evolocumab (REPATHA SURECLICK) 140 MG/ML SOAJ ADMINISTER 1 ML UNDER THE SKIN EVERY 14 DAYS 01/04/23   Alicia Amel, MD  furosemide (LASIX) 20 MG tablet Take 1 tablet (20 mg total) by mouth daily. 01/04/23   Alicia Amel, MD  gabapentin (NEURONTIN) 300 MG capsule Take 1 tablet (300mg ) once during the daytime and another at bedtime 01/04/23   Alicia Amel, MD  naltrexone (DEPADE) 50 MG tablet Take 0.5 tablets (25 mg total) by mouth daily. 01/04/23   Alicia Amel, MD  pantoprazole (PROTONIX) 40 MG tablet Take 1 tablet (40 mg total) by mouth 2 (two) times daily before a meal. 01/04/23   Alicia Amel, MD  rosuvastatin (CRESTOR) 20 MG tablet Take 1 tablet (20 mg total) by mouth daily. 01/04/23   Alicia Amel, MD  varenicline (CHANTIX) 0.5 MG tablet 0.5 mg orally once daily for 3 days, then 0.5 mg every 12 hours on days 4 through 7, and then 1 mg every 12 hours thereafter. Take with meals. 01/04/23   Alicia Amel, MD    Physical Exam    Vital Signs:  Kendra [name] does not have vital signs available for review today.  Given telephonic nature of communication, physical exam is limited. AAOx3. NAD. Normal affect.  Speech and respirations are unlabored.  Accessory Clinical Findings    None  Assessment & Plan    1.  Preoperative Cardiovascular Risk Assessment:  Kendra [name] perioperative risk of a major cardiac event is 0.9% according to the Revised Cardiac Risk Index (RCRI).  Therefore, she is at low risk for perioperative complications.   Her functional capacity is fair at 4.73 METs according to the [facility] Activity Status Index (DASI). Recommendations: According to ACC/AHA guidelines, no further cardiovascular testing  needed.  The patient may proceed to surgery at acceptable risk.   Antiplatelet and/or Anticoagulation Recommendations: Aspirin can be held for 5 days prior to her surgery.  Please resume Aspirin post operatively when it is felt to be safe from a bleeding standpoint.   The patient was advised that if she develops new symptoms prior to surgery to contact our office to arrange for a follow-up visit, and she verbalized understanding.   A copy of this note will be routed to requesting surgeon.  Time:   Today, I have spent 6 minutes with the patient with telehealth technology discussing medical history, symptoms, and management plan.     [name], PA-C  01/08/2023, 3:35  PM  

## 2023-01-09 ENCOUNTER — Encounter (HOSPITAL_COMMUNITY): Payer: Self-pay | Admitting: Surgery

## 2023-01-09 NOTE — Progress Notes (Signed)
Anesthesia Chart Review   Case: [id number] Date/Time: 01/10/23 0815   Procedure: RIGHT SUPERIOR PARATHYROIDECTOMY (Right) - 90   Anesthesia type: General   Pre-op diagnosis: PRIMARY HYPERPARATHYROIDISM   Location: [facility] ROOM 01 / [facility]   Surgeons: Darnell Level, MD       DISCUSSION:57 y.o. smoker with h/o HTN, GERD, nonobstructive coronary artery disease on coronary CTA 05/2019, primary hyperparathyroidism scheduled for above procedure 01/10/2023 with Dr. Darnell Level.  Per cardiology preoperative evaluation 01/08/2023, "Ms. Nettleton's perioperative risk of a major cardiac event is 0.9% according to the Revised Cardiac Risk Index (RCRI).  Therefore, she is at low risk for perioperative complications.   Her functional capacity is fair at 4.73 METs according to the [facility] Activity Status Index (DASI). Recommendations: According to ACC/AHA guidelines, no further cardiovascular testing needed.  The patient may proceed to surgery at acceptable risk.   Antiplatelet and/or Anticoagulation Recommendations: Aspirin can be held for 5 days prior to her surgery.  Please resume Aspirin post operatively when it is felt to be safe from a bleeding standpoint."  Anticipate pt can proceed with planned procedure barring acute status change.   VS: BP 114/76 Comment: right arm sitting  Pulse 94 Comment: known tachycardia  Temp 36.9 C (Oral)   Resp 20   Ht 5\' 4"  (1.626 m)   Wt 118.8 kg   LMP 02/21/2015   SpO2 98%   BMI 44.97 kg/m   PROVIDERS: Alicia Amel, MD is PCP   Primary Cardiologist:  Tonny Bollman, MD  LABS: Labs reviewed: Acceptable for surgery. (all labs ordered are listed, but only abnormal results are displayed)  Labs Reviewed  COMPREHENSIVE METABOLIC PANEL - Abnormal; Notable for the following components:      Result Value   Glucose, Bld 102 (*)    All other components within normal limits  CBC     IMAGES:   EKG:   CV: Echo 04/16/2019 1. The left ventricle has normal systolic  function with an ejection  fraction of 60-65%. The cavity size was normal. Left ventricular diastolic  parameters were normal.   2. The right ventricle has normal systolic function. The cavity was  normal. There is no increase in right ventricular wall thickness.   3. No evidence of mitral valve stenosis.   4. No stenosis of the aortic valve.   5. The aorta is normal unless otherwise noted.   6. The aortic root and ascending aorta are normal in size and structure.   7. The atrial septum is grossly normal.  Myocardial perfusion 01/29/2018 Nuclear stress EF: 60%. There is a small defect of mild severity present in the mid anteroseptal and apical septal location. The defect is non-reversible and consistent with breast attenuation artifact. No ischemia noted This is a low risk study. The left ventricular ejection fraction is normal (55-65%). There was no ST segment deviation noted during stress. Past Medical History:  Diagnosis Date   Anemia    Asthma    Cigarette smoker    Dysrhythmia    Tachycardia   Esophagitis    GERD (gastroesophageal reflux disease)    Hemangioma of liver    HTN (hypertension)    Hyperplastic colon polyp    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    Cor CTA 05/2019: Ca score 81.5 (96th percentile); mRCA < 25; dLM < 25; oLAD 25-49, pLAD < 25    Past Surgical History:  Procedure Laterality Date   ABDOMINAL HYSTERECTOMY  04/12/2015   ANKLE SURGERY  Left    CESAREAN SECTION     CYSTOSCOPY N/A 04/12/2015   Procedure: CYSTOSCOPY;  Surgeon: Willodean Rosenthal, MD;  Location: [facility];  Service: Gynecology;  Laterality: N/A;   FINGER SURGERY Right    3rd and 4th fingers   TIBIA FRACTURE SURGERY Left    ankle ORIF-  hardware has been removed though   TOE OSTEOTOMY Left    5th toe   TUBAL LIGATION      MEDICATIONS:  acetaminophen (TYLENOL 8 HOUR) 650 MG CR tablet   albuterol (VENTOLIN HFA) 108 (90 Base) MCG/ACT inhaler   alum & mag hydroxide-simeth  (MAALOX/MYLANTA) 200-200-20 MG/5ML suspension   aspirin EC 81 MG tablet   buPROPion (WELLBUTRIN XL) 150 MG 24 hr tablet   Evolocumab (REPATHA SURECLICK) 140 MG/ML SOAJ   furosemide (LASIX) 20 MG tablet   gabapentin (NEURONTIN) 300 MG capsule   naltrexone (DEPADE) 50 MG tablet   pantoprazole (PROTONIX) 40 MG tablet   rosuvastatin (CRESTOR) 20 MG tablet   varenicline (CHANTIX) 0.5 MG tablet   No current facility-administered medications for this encounter.   Jodell Cipro Ward, PA-C [facility] [phone]

## 2023-01-09 NOTE — Anesthesia Preprocedure Evaluation (Addendum)
Anesthesia Evaluation  Patient identified by MRN, date of birth, ID band Patient awake    Reviewed: Allergy & Precautions, NPO status , Patient's Chart, lab work & pertinent test results  History of Anesthesia Complications Negative for: history of anesthetic complications  Airway Mallampati: II  TM Distance: >3 FB Neck ROM: Full    Dental no notable dental hx. (+) Teeth Intact, Dental Advisory Given   Pulmonary asthma , Current Smoker   Pulmonary exam normal        Cardiovascular hypertension, Normal cardiovascular exam     Neuro/Psych  Headaches    GI/Hepatic Neg liver ROS,GERD  Medicated,,  Endo/Other    Morbid obesityPRIMARY HYPERPARATHYROIDISM  Renal/GU negative Renal ROS     Musculoskeletal  (+) Arthritis ,    Abdominal   Peds  Hematology negative hematology ROS (+)   Anesthesia Other Findings Day of surgery medications reviewed with patient.  Reproductive/Obstetrics                              Anesthesia Physical Anesthesia Plan  ASA: 3  Anesthesia Plan: General   Post-op Pain Management: Tylenol PO (pre-op)*   Induction: Intravenous  PONV Risk Score and Plan: 2 and Ondansetron, Dexamethasone, Treatment may vary due to age or medical condition and Midazolam  Airway Management Planned: Oral ETT  Additional Equipment: None  Intra-op Plan:   Post-operative Plan: Extubation in OR  Informed Consent: I have reviewed the patients History and Physical, chart, labs and discussed the procedure including the risks, benefits and alternatives for the proposed anesthesia with the patient or authorized representative who has indicated his/her understanding and acceptance.     Dental advisory given  Plan Discussed with: CRNA  Anesthesia Plan Comments: (See PAT note 01/03/2023)        Anesthesia Quick Evaluation

## 2023-01-10 ENCOUNTER — Encounter (HOSPITAL_COMMUNITY)
Admission: RE | Disposition: A | Discharge status: Home / Self Care | Payer: Self-pay | Source: Home / Self Care | Attending: Surgery

## 2023-01-10 ENCOUNTER — Other Ambulatory Visit: Payer: Self-pay

## 2023-01-10 ENCOUNTER — Ambulatory Visit (HOSPITAL_COMMUNITY): Payer: 59 | Admitting: Physician Assistant

## 2023-01-10 ENCOUNTER — Ambulatory Visit (HOSPITAL_BASED_OUTPATIENT_CLINIC_OR_DEPARTMENT_OTHER): Payer: 59 | Admitting: Anesthesiology

## 2023-01-10 ENCOUNTER — Ambulatory Visit (HOSPITAL_COMMUNITY)
Admission: RE | Admit: 2023-01-10 | Discharge: 2023-01-10 | Disposition: A | Discharge status: Home / Self Care | Payer: 59 | Attending: Surgery | Admitting: Surgery

## 2023-01-10 ENCOUNTER — Encounter (HOSPITAL_COMMUNITY): Payer: Self-pay | Admitting: Surgery

## 2023-01-10 DIAGNOSIS — Z79899 Other long term (current) drug therapy: Secondary | ICD-10-CM | POA: Diagnosis not present

## 2023-01-10 DIAGNOSIS — Z6841 Body Mass Index (BMI) 40.0 and over, adult: Secondary | ICD-10-CM | POA: Diagnosis not present

## 2023-01-10 DIAGNOSIS — F17210 Nicotine dependence, cigarettes, uncomplicated: Secondary | ICD-10-CM | POA: Diagnosis not present

## 2023-01-10 DIAGNOSIS — D351 Benign neoplasm of parathyroid gland: Secondary | ICD-10-CM | POA: Diagnosis not present

## 2023-01-10 DIAGNOSIS — K219 Gastro-esophageal reflux disease without esophagitis: Secondary | ICD-10-CM | POA: Diagnosis not present

## 2023-01-10 DIAGNOSIS — J45909 Unspecified asthma, uncomplicated: Secondary | ICD-10-CM | POA: Diagnosis not present

## 2023-01-10 DIAGNOSIS — E210 Primary hyperparathyroidism: Secondary | ICD-10-CM | POA: Diagnosis present

## 2023-01-10 DIAGNOSIS — I10 Essential (primary) hypertension: Secondary | ICD-10-CM | POA: Diagnosis not present

## 2023-01-10 DIAGNOSIS — F17200 Nicotine dependence, unspecified, uncomplicated: Secondary | ICD-10-CM | POA: Diagnosis not present

## 2023-01-10 HISTORY — PX: PARATHYROIDECTOMY: SHX19

## 2023-01-10 SURGERY — PARATHYROIDECTOMY
Anesthesia: General | Laterality: Right

## 2023-01-10 MED ORDER — MIDAZOLAM HCL 2 MG/2ML IJ SOLN
INTRAMUSCULAR | Status: AC
  Filled 2023-01-10: qty 2

## 2023-01-10 MED ORDER — OXYCODONE HCL 5 MG PO TABS
5.0000 mg | ORAL_TABLET | Freq: Once | ORAL | Status: AC | PRN
  Administered 2023-01-10: 5 mg via ORAL

## 2023-01-10 MED ORDER — FENTANYL CITRATE (PF) 250 MCG/5ML IJ SOLN
INTRAMUSCULAR | Status: AC
  Filled 2023-01-10: qty 5

## 2023-01-10 MED ORDER — ORAL CARE MOUTH RINSE: 15.0000 mL | Freq: Once | OROMUCOSAL | Status: AC

## 2023-01-10 MED ORDER — ROCURONIUM BROMIDE 10 MG/ML (PF) SYRINGE
PREFILLED_SYRINGE | INTRAVENOUS | Status: DC | PRN
  Administered 2023-01-10: 70 mg via INTRAVENOUS

## 2023-01-10 MED ORDER — FENTANYL CITRATE (PF) 250 MCG/5ML IJ SOLN
INTRAMUSCULAR | Status: DC | PRN
  Administered 2023-01-10 (×2): 50 ug via INTRAVENOUS
  Administered 2023-01-10: 100 ug via INTRAVENOUS

## 2023-01-10 MED ORDER — CHLORHEXIDINE GLUCONATE CLOTH 2 % EX PADS: 6.0000 | MEDICATED_PAD | Freq: Once | CUTANEOUS | Status: DC

## 2023-01-10 MED ORDER — CEFAZOLIN SODIUM 1 G IJ SOLR
INTRAMUSCULAR | Status: AC
  Filled 2023-01-10: qty 10

## 2023-01-10 MED ORDER — PROPOFOL 10 MG/ML IV BOLUS
INTRAVENOUS | Status: AC
  Filled 2023-01-10: qty 20

## 2023-01-10 MED ORDER — ONDANSETRON HCL 4 MG/2ML IJ SOLN
INTRAMUSCULAR | Status: DC | PRN
  Administered 2023-01-10: 4 mg via INTRAVENOUS

## 2023-01-10 MED ORDER — BUPIVACAINE HCL 0.25 % IJ SOLN
INTRAMUSCULAR | Status: AC
  Filled 2023-01-10: qty 1

## 2023-01-10 MED ORDER — SUGAMMADEX SODIUM 200 MG/2ML IV SOLN
INTRAVENOUS | Status: DC | PRN
  Administered 2023-01-10: 100 mg via INTRAVENOUS
  Administered 2023-01-10: 150 mg via INTRAVENOUS

## 2023-01-10 MED ORDER — ALBUTEROL SULFATE HFA 108 (90 BASE) MCG/ACT IN AERS
INHALATION_SPRAY | RESPIRATORY_TRACT | Status: DC | PRN
  Administered 2023-01-10 (×2): 5 via RESPIRATORY_TRACT

## 2023-01-10 MED ORDER — AMISULPRIDE (ANTIEMETIC) 5 MG/2ML IV SOLN: 10.0000 mg | Freq: Once | INTRAVENOUS | Status: DC | PRN

## 2023-01-10 MED ORDER — LIDOCAINE HCL (PF) 2 % IJ SOLN
INTRAMUSCULAR | Status: AC
  Filled 2023-01-10: qty 5

## 2023-01-10 MED ORDER — TRAMADOL HCL 50 MG PO TABS: 50.0000 mg | ORAL_TABLET | Freq: Four times a day (QID) | ORAL | 0 refills | Status: DC | PRN

## 2023-01-10 MED ORDER — ALBUTEROL SULFATE HFA 108 (90 BASE) MCG/ACT IN AERS
INHALATION_SPRAY | RESPIRATORY_TRACT | Status: AC
  Filled 2023-01-10: qty 6.7

## 2023-01-10 MED ORDER — CHLORHEXIDINE GLUCONATE 0.12 % MT SOLN
15.0000 mL | Freq: Once | OROMUCOSAL | Status: AC
  Administered 2023-01-10: 15 mL via OROMUCOSAL

## 2023-01-10 MED ORDER — 0.9 % SODIUM CHLORIDE (POUR BTL) OPTIME
TOPICAL | Status: DC | PRN
  Administered 2023-01-10: 1000 mL

## 2023-01-10 MED ORDER — OXYCODONE HCL 5 MG/5ML PO SOLN: 5.0000 mg | Freq: Once | ORAL | Status: AC | PRN

## 2023-01-10 MED ORDER — BUPIVACAINE HCL 0.25 % IJ SOLN
INTRAMUSCULAR | Status: DC | PRN
  Administered 2023-01-10: 10 mL

## 2023-01-10 MED ORDER — ACETAMINOPHEN 500 MG PO TABS
1000.0000 mg | ORAL_TABLET | Freq: Once | ORAL | Status: AC
  Administered 2023-01-10: 1000 mg via ORAL
  Filled 2023-01-10: qty 2

## 2023-01-10 MED ORDER — PROPOFOL 10 MG/ML IV BOLUS
INTRAVENOUS | Status: DC | PRN
  Administered 2023-01-10: 200 mg via INTRAVENOUS

## 2023-01-10 MED ORDER — HEMOSTATIC AGENTS (NO CHARGE) OPTIME
TOPICAL | Status: DC | PRN
  Administered 2023-01-10: 1 via TOPICAL

## 2023-01-10 MED ORDER — MIDAZOLAM HCL 5 MG/5ML IJ SOLN
INTRAMUSCULAR | Status: DC | PRN
  Administered 2023-01-10: 2 mg via INTRAVENOUS

## 2023-01-10 MED ORDER — FENTANYL CITRATE PF 50 MCG/ML IJ SOSY: 25.0000 ug | PREFILLED_SYRINGE | INTRAMUSCULAR | Status: DC | PRN

## 2023-01-10 MED ORDER — CEFAZOLIN SODIUM-DEXTROSE 1-4 GM/50ML-% IV SOLN
INTRAVENOUS | Status: DC | PRN
  Administered 2023-01-10: 1 g via INTRAVENOUS

## 2023-01-10 MED ORDER — OXYCODONE HCL 5 MG PO TABS
ORAL_TABLET | ORAL | Status: AC
  Filled 2023-01-10: qty 1

## 2023-01-10 MED ORDER — ROCURONIUM BROMIDE 10 MG/ML (PF) SYRINGE
PREFILLED_SYRINGE | INTRAVENOUS | Status: AC
  Filled 2023-01-10: qty 10

## 2023-01-10 MED ORDER — LACTATED RINGERS IV SOLN: INTRAVENOUS | Status: DC

## 2023-01-10 MED ORDER — DEXAMETHASONE SODIUM PHOSPHATE 10 MG/ML IJ SOLN
INTRAMUSCULAR | Status: AC
  Filled 2023-01-10: qty 1

## 2023-01-10 MED ORDER — LIDOCAINE 2% (20 MG/ML) 5 ML SYRINGE
INTRAMUSCULAR | Status: DC | PRN
  Administered 2023-01-10: 100 mg via INTRAVENOUS

## 2023-01-10 MED ORDER — ONDANSETRON HCL 4 MG/2ML IJ SOLN
INTRAMUSCULAR | Status: AC
  Filled 2023-01-10: qty 2

## 2023-01-10 MED ORDER — CEFAZOLIN SODIUM-DEXTROSE 2-4 GM/100ML-% IV SOLN
2.0000 g | INTRAVENOUS | Status: AC
  Administered 2023-01-10: 2 g via INTRAVENOUS
  Filled 2023-01-10: qty 100

## 2023-01-10 MED ORDER — DEXAMETHASONE SODIUM PHOSPHATE 10 MG/ML IJ SOLN
INTRAMUSCULAR | Status: DC | PRN
  Administered 2023-01-10: 10 mg via INTRAVENOUS

## 2023-01-10 SURGICAL SUPPLY — 35 items

## 2023-01-10 NOTE — Anesthesia Postprocedure Evaluation (Signed)
Anesthesia Post Note  Patient: Kendra [name]  Procedure(s) Performed: RIGHT SUPERIOR PARATHYROIDECTOMY (Right)     Patient location during evaluation: PACU Anesthesia Type: General Level of consciousness: awake and alert Pain management: pain level controlled Vital Signs Assessment: post-procedure vital signs reviewed and stable Respiratory status: spontaneous breathing, nonlabored ventilation and respiratory function stable Cardiovascular status: blood pressure returned to baseline Postop Assessment: no apparent nausea or vomiting Anesthetic complications: no   No notable events documented.  Last Vitals:  Vitals:   01/10/23 1030 01/10/23 1041  BP: 139/86 (!) 159/93  Pulse: 77 62  Resp: 16 17  Temp: 36.6 C 36.7 C  SpO2: 96% 97%    Last Pain:  Vitals:   01/10/23 1041  TempSrc:   PainSc: 6                  Shanda Howells

## 2023-01-10 NOTE — Discharge Instructions (Addendum)
[name] - Dr. Todd Gerkin  THYROID & PARATHYROID SURGERY:  POST-OP INSTRUCTIONS  Always review the instruction sheet provided by the hospital nurse at discharge.  A prescription for pain medication may be sent to your pharmacy at the time of discharge.  Take your pain medication as prescribed.  If narcotic pain medicine is not needed, then you may take acetaminophen (Tylenol) or ibuprofen (Advil) as needed for pain or soreness.  Take your normal home medications as prescribed unless otherwise directed.  If you need a refill on your pain medication, please contact the office during regular business hours.  Prescriptions will not be processed by the office after 5:00PM or on weekends.  Start with a light diet upon arrival home, such as soup and crackers or toast.  Be sure to drink plenty of fluids.  Resume your normal diet the day after surgery.  Most patients will experience some swelling and bruising on the chest and neck area.  Ice packs will help for the first 48 hours after arriving home.  Swelling and bruising will take several days to resolve.   It is common to experience some constipation after surgery.  Increasing fluid intake and taking a stool softener (Colace) will usually help to prevent this problem.  A mild laxative (Milk of Magnesia or Miralax) should be taken according to package directions if there has been no bowel movement after 48 hours.  Dermabond glue covers your incision. This seals the wound and you may shower at any time. The Dermabond will remain in place for about a week.  You may gradually remove the glue when it loosens around the edges.  If you need to loosen the Dermabond for removal, apply a layer of Vaseline to the wound for 15 minutes and then remove with a Kleenex. Your sutures are under the skin and will not show - they will dissolve on their own.  You may resume light daily activities beginning the day after discharge (such as self-care,  walking, climbing stairs), gradually increasing activities as tolerated. You may have sexual intercourse when it is comfortable. Refrain from any heavy lifting or straining until approved by your doctor. You may drive when you no longer are taking prescription pain medication, you can comfortably wear a seatbelt, and you can safely maneuver your car and apply the brakes.  You will see your doctor in the office for a follow-up appointment approximately three weeks after your surgery.  Make sure that you call for this appointment within a day or two after you arrive home to insure a convenient appointment time. Please have any requested laboratory tests performed a few days prior to your office visit so that the results will be available at your follow up appointment.  WHEN TO CALL THE [facility] OFFICE: -- Fever greater than 101.5 -- Inability to urinate -- Nausea and/or vomiting - persistent -- Extreme swelling or bruising -- Continued bleeding from incision -- Increased pain, redness, or drainage from the incision -- Difficulty swallowing or breathing -- Muscle cramping or spasms -- Numbness or tingling in hands or around lips  The clinic staff is available to answer your questions during regular business hours.  Please don't hesitate to call and ask to speak to one of the nurses if you have concerns.  [facility] OFFICE: [phone] (24 hours)  Please sign up for MyChart accounts. This will allow you to communicate directly with my nurse or myself without having to call the office. It will also allow you   to view your test results. You will need to enroll in MyChart for my office ([facility]) and for the hospital ([facility]).  Todd Gerkin, MD [facility] A [facility] practice 

## 2023-01-10 NOTE — Op Note (Signed)
OPERATIVE REPORT - PARATHYROIDECTOMY  Preoperative diagnosis: Primary hyperparathyroidism  Postop diagnosis: Same  Procedure: Right superior minimally invasive parathyroidectomy  Surgeon:  Darnell Level, MD  Anesthesia: General endotracheal  Estimated blood loss: Minimal  Preparation: ChloraPrep  Indications: Patient is referred from the [facility] internal medicine program for surgical evaluation of suspected primary hyperparathyroidism. Patient was noted on routine laboratory studies to have an elevated serum calcium level. Most recent laboratory showed a calcium of 10.6 and an elevated intact PTH level of 91. Patient has not had any imaging studies. She complains of fatigue. She notes bone and joint discomfort. Both the USN and sestamibi scan demonstrate a large adenoma in the right superior position.  Patient now comes to surgery for parathyroidectomy.  Procedure: The patient was prepared in the pre-operative holding area. The patient was brought to the operating room and placed in a supine position on the operating room table. Following administration of general anesthesia, the patient was positioned and then prepped and draped in the usual strict aseptic fashion. After ascertaining that an adequate level of anesthesia been achieved, a neck incision was made with a #15 blade. Dissection was carried through subcutaneous tissues and platysma. Hemostasis was obtained with the electrocautery. Skin flaps were developed circumferentially and a Weitlander retractor was placed for exposure.  Strap muscles were incised in the midline. Strap muscles were reflected laterally exposing the thyroid lobe. With gentle blunt dissection the thyroid lobe was mobilized.  Dissection was carried posteriorly and an enlarged parathyroid gland was identified. It was gently mobilized. Vascular structures were divided between small ligaclips. The parathyroid gland was completely excised. It was submitted to  pathology where frozen section confirmed hypercellular parathyroid tissue consistent with adenoma.  Neck was irrigated with warm saline and good hemostasis was noted. Fibrillar was placed in the operative field. Strap muscles were approximated in the midline with interrupted 3-0 Vicryl sutures. Platysma was closed with interrupted 3-0 Vicryl sutures. Marcaine was infiltrated circumferentially. Skin was closed with a running 4-0 Monocryl subcuticular suture. Wound was washed and dried and Dermabond was applied. Patient was awakened from anesthesia and brought to the recovery room. The patient tolerated the procedure well.   Darnell Level, MD [facility] Office: [phone]

## 2023-01-10 NOTE — Transfer of Care (Signed)
Immediate Anesthesia Transfer of Care Note  Patient: Kendra [name]  Procedure(s) Performed: RIGHT SUPERIOR PARATHYROIDECTOMY (Right)  Patient Location: PACU  Anesthesia Type:General  Level of Consciousness: awake, oriented, and patient cooperative  Airway & Oxygen Therapy: Patient Spontanous Breathing and Patient connected to face mask oxygen  Post-op Assessment: Report given to RN and Post -op Vital signs reviewed and stable  Post vital signs: Reviewed  Last Vitals:  Vitals Value Taken Time  BP 163/94 01/10/23 1001  Temp    Pulse 85 01/10/23 1002  Resp 26 01/10/23 1001  SpO2 100 % 01/10/23 1002  Vitals shown include unvalidated device data.  Last Pain:  Vitals:   01/10/23 1610  TempSrc:   PainSc: 7       Patients Stated Pain Goal: 5 (01/10/23 9604)  Complications: No notable events documented.

## 2023-01-10 NOTE — Interval H&P Note (Signed)
History and Physical Interval Note:  01/10/2023 8:21 AM  Kendra [name]  has presented today for surgery, with the diagnosis of PRIMARY HYPERPARATHYROIDISM.  The various methods of treatment have been discussed with the patient and family. After consideration of risks, benefits and other options for treatment, the patient has consented to    Procedure(s) with comments: RIGHT SUPERIOR PARATHYROIDECTOMY (Right) - 90 as a surgical intervention.    The patient's history has been reviewed, patient examined, no change in status, stable for surgery.  I have reviewed the patient's chart and labs.  Questions were answered to the patient's satisfaction.    Darnell Level, MD [facility] A [facility] practice Office: [phone]   Darnell Level

## 2023-01-10 NOTE — Anesthesia Procedure Notes (Signed)
Procedure Name: Intubation Date/Time: 01/10/2023 8:42 AM  Performed by: Lovie Chol, CRNAPre-anesthesia Checklist: Patient identified, Emergency Drugs available, Suction available and Patient being monitored Patient Re-evaluated:Patient Re-evaluated prior to induction Oxygen Delivery Method: Circle System Utilized Preoxygenation: Pre-oxygenation with 100% oxygen Induction Type: IV induction Ventilation: Mask ventilation without difficulty Laryngoscope Size: Miller and 2 Grade View: Grade I Tube type: Oral Tube size: 7.0 mm Number of attempts: 1 Airway Equipment and Method: Stylet Placement Confirmation: ETT inserted through vocal cords under direct vision, positive ETCO2 and breath sounds checked- equal and bilateral Secured at: 21 cm Tube secured with: Tape Dental Injury: Teeth and Oropharynx as per pre-operative assessment

## 2023-01-11 ENCOUNTER — Encounter (HOSPITAL_COMMUNITY): Payer: Self-pay | Admitting: Surgery

## 2023-01-11 LAB — SURGICAL PATHOLOGY

## 2023-01-28 ENCOUNTER — Ambulatory Visit (INDEPENDENT_AMBULATORY_CARE_PROVIDER_SITE_OTHER): Payer: 59

## 2023-01-28 VITALS — Ht 64.0 in | Wt 266.0 lb

## 2023-01-28 DIAGNOSIS — Z0000 Encounter for general adult medical examination without abnormal findings: Secondary | ICD-10-CM | POA: Diagnosis not present

## 2023-01-28 NOTE — Patient Instructions (Addendum)
[name] , Thank you for taking time to come for your Medicare Wellness Visit. I appreciate your ongoing commitment to your health goals. Please review the following plan we discussed and let me know if I can assist you in the future.   These are the goals we discussed:  Goals       Quit smoking / using tobacco      Remain active and independent      Weight < 240 lb (108.9 kg) (pt-stated)      Goal by 08/28/19        This is a list of the screening recommended for you and due dates:  Health Maintenance  Topic Date Due   Zoster (Shingles) Vaccine (1 of 2) Never done   COVID-19 Vaccine (5 - 2023-24 season) 04/27/2022   Flu Shot  03/28/2023   Mammogram  01/13/2024   Medicare Annual Wellness Visit  01/28/2024   Pap Smear  05/17/2025   Colon Cancer Screening  10/25/2025   DTaP/Tdap/Td vaccine (3 - Td or Tdap) 03/02/2032   Hepatitis C Screening  Completed   HIV Screening  Completed   HPV Vaccine  Aged Out    Advanced directives: Information on Advanced Care Planning can be found at [facility] Advance Health Care Directives Advance Health Care Directives ([email or web address]  Please bring a copy of your health care power of attorney and living will to the office to be added to your chart at your convenience.   Conditions/risks identified: Aim for 30 minutes of exercise or brisk walking, 6-8 glasses of water, and 5 servings of fruits and vegetables each day.   Next appointment: Follow up in one year for your annual wellness visit.   Preventive Care 40-64 Years, Female Preventive care refers to lifestyle choices and visits with your health care provider that can promote health and wellness. What does preventive care include? A yearly physical exam. This is also called an annual well check. Dental exams once or twice a year. Routine eye exams. Ask your health care provider how often you should have your eyes checked. Personal lifestyle choices, including: Daily care of  your teeth and gums. Regular physical activity. Eating a healthy diet. Avoiding tobacco and drug use. Limiting alcohol use. Practicing safe sex. Taking low-dose aspirin daily starting at age 32. Taking vitamin and mineral supplements as recommended by your health care provider. What happens during an annual well check? The services and screenings done by your health care provider during your annual well check will depend on your age, overall health, lifestyle risk factors, and family history of disease. Counseling  Your health care provider may ask you questions about your: Alcohol use. Tobacco use. Drug use. Emotional well-being. Home and relationship well-being. Sexual activity. Eating habits. Work and work Astronomer. Method of birth control. Menstrual cycle. Pregnancy history. Screening  You may have the following tests or measurements: Height, weight, and BMI. Blood pressure. Lipid and cholesterol levels. These may be checked every 5 years, or more frequently if you are over 64 years old. Skin check. Lung cancer screening. You may have this screening every year starting at age 91 if you have a 30-pack-year history of smoking and currently smoke or have quit within the past 15 years. Fecal occult blood test (FOBT) of the stool. You may have this test every year starting at age 69. Flexible sigmoidoscopy or colonoscopy. You may have a sigmoidoscopy every 5 years or a colonoscopy every 10 years starting at  age 39. Hepatitis C blood test. Hepatitis B blood test. Sexually transmitted disease (STD) testing. Diabetes screening. This is done by checking your blood sugar (glucose) after you have not eaten for a while (fasting). You may have this done every 1-3 years. Mammogram. This may be done every 1-2 years. Talk to your health care provider about when you should start having regular mammograms. This may depend on whether you have a family history of breast cancer. BRCA-related  cancer screening. This may be done if you have a family history of breast, ovarian, tubal, or peritoneal cancers. Pelvic exam and Pap test. This may be done every 3 years starting at age 38. Starting at age 36, this may be done every 5 years if you have a Pap test in combination with an HPV test. Bone density scan. This is done to screen for osteoporosis. You may have this scan if you are at high risk for osteoporosis. Discuss your test results, treatment options, and if necessary, the need for more tests with your health care provider. Vaccines  Your health care provider may recommend certain vaccines, such as: Influenza vaccine. This is recommended every year. Tetanus, diphtheria, and acellular pertussis (Tdap, Td) vaccine. You may need a Td booster every 10 years. Zoster vaccine. You may need this after age 42. Pneumococcal 13-valent conjugate (PCV13) vaccine. You may need this if you have certain conditions and were not previously vaccinated. Pneumococcal polysaccharide (PPSV23) vaccine. You may need one or two doses if you smoke cigarettes or if you have certain conditions. Talk to your health care provider about which screenings and vaccines you need and how often you need them. This information is not intended to replace advice given to you by your health care provider. Make sure you discuss any questions you have with your health care provider. Document Released: 09/09/2015 Document Revised: 05/02/2016 Document Reviewed: 06/14/2015 Elsevier Interactive Patient Education  2017 [employer].    Fall Prevention in the Home Falls can cause injuries. They can happen to people of all ages. There are many things you can do to make your home safe and to help prevent falls. What can I do on the outside of my home? Regularly fix the edges of walkways and driveways and fix any cracks. Remove anything that might make you trip as you walk through a door, such as a raised step or threshold. Trim  any bushes or trees on the path to your home. Use bright outdoor lighting. Clear any walking paths of anything that might make someone trip, such as rocks or tools. Regularly check to see if handrails are loose or broken. Make sure that both sides of any steps have handrails. Any raised decks and porches should have guardrails on the edges. Have any leaves, snow, or ice cleared regularly. Use sand or salt on walking paths during winter. Clean up any spills in your garage right away. This includes oil or grease spills. What can I do in the bathroom? Use night lights. Install grab bars by the toilet and in the tub and shower. Do not use towel bars as grab bars. Use non-skid mats or decals in the tub or shower. If you need to sit down in the shower, use a plastic, non-slip stool. Keep the floor dry. Clean up any water that spills on the floor as soon as it happens. Remove soap buildup in the tub or shower regularly. Attach bath mats securely with double-sided non-slip rug tape. Do not have throw rugs and  other things on the floor that can make you trip. What can I do in the bedroom? Use night lights. Make sure that you have a light by your bed that is easy to reach. Do not use any sheets or blankets that are too big for your bed. They should not hang down onto the floor. Have a firm chair that has side arms. You can use this for support while you get dressed. Do not have throw rugs and other things on the floor that can make you trip. What can I do in the kitchen? Clean up any spills right away. Avoid walking on wet floors. Keep items that you use a lot in easy-to-reach places. If you need to reach something above you, use a strong step stool that has a grab bar. Keep electrical cords out of the way. Do not use floor polish or wax that makes floors slippery. If you must use wax, use non-skid floor wax. Do not have throw rugs and other things on the floor that can make you trip. What can I  do with my stairs? Do not leave any items on the stairs. Make sure that there are handrails on both sides of the stairs and use them. Fix handrails that are broken or loose. Make sure that handrails are as long as the stairways. Check any carpeting to make sure that it is firmly attached to the stairs. Fix any carpet that is loose or worn. Avoid having throw rugs at the top or bottom of the stairs. If you do have throw rugs, attach them to the floor with carpet tape. Make sure that you have a light switch at the top of the stairs and the bottom of the stairs. If you do not have them, ask someone to add them for you. What else can I do to help prevent falls? Wear shoes that: Do not have high heels. Have rubber bottoms. Are comfortable and fit you well. Are closed at the toe. Do not wear sandals. If you use a stepladder: Make sure that it is fully opened. Do not climb a closed stepladder. Make sure that both sides of the stepladder are locked into place. Ask someone to hold it for you, if possible. Clearly mark and make sure that you can see: Any grab bars or handrails. First and last steps. Where the edge of each step is. Use tools that help you move around (mobility aids) if they are needed. These include: Canes. Walkers. Scooters. Crutches. Turn on the lights when you go into a dark area. Replace any light bulbs as soon as they burn out. Set up your furniture so you have a clear path. Avoid moving your furniture around. If any of your floors are uneven, fix them. If there are any pets around you, be aware of where they are. Review your medicines with your doctor. Some medicines can make you feel dizzy. This can increase your chance of falling. Ask your doctor what other things that you can do to help prevent falls. This information is not intended to replace advice given to you by your health care provider. Make sure you discuss any questions you have with your health care  provider. Document Released: 06/09/2009 Document Revised: 01/19/2016 Document Reviewed: 09/17/2014 Elsevier Interactive Patient Education  2017 [employer].

## 2023-01-28 NOTE — Progress Notes (Addendum)
Subjective:   Kendra [name] is a 57 y.o. female who presents for Medicare Annual (Subsequent) preventive examination.  I connected with  Oretha Caprice on 01/28/23 by a audio enabled telemedicine application and verified that I am speaking with the correct person using two identifiers.  Patient Location: Home  Provider Location: Home Office  I discussed the limitations of evaluation and management by telemedicine. The patient expressed understanding and agreed to proceed.  Review of Systems     Cardiac Risk Factors include: dyslipidemia;obesity (BMI >30kg/m2)     Objective:    Today's Vitals   01/28/23 1305  Weight: 266 lb (120.7 kg)  Height: 5\' 4"  (1.626 m)   Body mass index is 45.66 kg/m.     01/28/2023    1:16 PM 01/10/2023    6:31 AM 01/03/2023    9:14 AM 09/17/2022    2:55 PM 05/17/2022    9:28 AM 12/28/2021   11:10 AM 08/16/2021   11:33 AM  Advanced Directives  Does Patient Have a Medical Advance Directive? No Yes No No No No No  Does patient want to make changes to medical advance directive? Yes (MAU/Ambulatory/Procedural Areas - Information given)        Would patient like information on creating a medical advance directive?    No - Patient declined No - Patient declined No - Patient declined No - Patient declined    Current Medications (verified) Outpatient Encounter Medications as of 01/28/2023  Medication Sig   acetaminophen (TYLENOL 8 HOUR) 650 MG CR tablet Take 1 tablet (650 mg total) by mouth every 8 (eight) hours as needed for pain.   albuterol (VENTOLIN HFA) 108 (90 Base) MCG/ACT inhaler INHALE 2 PUFFS BY MOUTH EVERY 4 HOURS AS NEEDED FOR WHEEZING OR SHORTNESS OF BREATH   alum & mag hydroxide-simeth (MAALOX/MYLANTA) 200-200-20 MG/5ML suspension Take 30 mLs by mouth every 6 (six) hours as needed for indigestion or heartburn. Reported on 10/26/2015   aspirin EC 81 MG tablet Take 1 tablet (81 mg total) by mouth daily. Swallow whole.   buPROPion (WELLBUTRIN XL)  150 MG 24 hr tablet Take 1 tablet (150 mg total) by mouth daily after breakfast.   Evolocumab (REPATHA SURECLICK) 140 MG/ML SOAJ ADMINISTER 1 ML UNDER THE SKIN EVERY 14 DAYS   furosemide (LASIX) 20 MG tablet Take 1 tablet (20 mg total) by mouth daily.   gabapentin (NEURONTIN) 300 MG capsule Take 1 tablet (300mg ) once during the daytime and another at bedtime   naltrexone (DEPADE) 50 MG tablet Take 0.5 tablets (25 mg total) by mouth daily.   pantoprazole (PROTONIX) 40 MG tablet Take 1 tablet (40 mg total) by mouth 2 (two) times daily before a meal.   rosuvastatin (CRESTOR) 20 MG tablet Take 1 tablet (20 mg total) by mouth daily.   traMADol (ULTRAM) 50 MG tablet Take 1 tablet (50 mg total) by mouth every 6 (six) hours as needed for moderate pain.   varenicline (CHANTIX) 0.5 MG tablet 0.5 mg orally once daily for 3 days, then 0.5 mg every 12 hours on days 4 through 7, and then 1 mg every 12 hours thereafter. Take with meals.   No facility-administered encounter medications on file as of 01/28/2023.    Allergies (verified) Lisinopril   History: Past Medical History:  Diagnosis Date   Anemia    Asthma    Cigarette smoker    Dysrhythmia    Tachycardia   Esophagitis    GERD (gastroesophageal reflux disease)  Hemangioma of liver    HTN (hypertension)    Hyperplastic colon polyp    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    Cor CTA 05/2019: Ca score 81.5 (96th percentile); mRCA < 25; dLM < 25; oLAD 25-49, pLAD < 25   Past Surgical History:  Procedure Laterality Date   ABDOMINAL HYSTERECTOMY  04/12/2015   ANKLE SURGERY Left    CESAREAN SECTION     CYSTOSCOPY N/A 04/12/2015   Procedure: CYSTOSCOPY;  Surgeon: Willodean Rosenthal, MD;  Location: [facility];  Service: Gynecology;  Laterality: N/A;   FINGER SURGERY Right    3rd and 4th fingers   PARATHYROIDECTOMY Right 01/10/2023   Procedure: RIGHT SUPERIOR PARATHYROIDECTOMY;  Surgeon: Darnell Level, MD;  Location: [facility];  Service:  General;  Laterality: Right;  90   TIBIA FRACTURE SURGERY Left    ankle ORIF-  hardware has been removed though   TOE OSTEOTOMY Left    5th toe   TUBAL LIGATION     Family History  Problem Relation Age of Onset   Diabetes Mother    Heart disease Mother    Lung cancer Father 19   Diabetes Sister    Breast cancer Neg Hx    Stomach cancer Neg Hx    Colon cancer Neg Hx    Pancreatic cancer Neg Hx    Social History   Socioeconomic History   Marital status: Single    Spouse name: Not on file   Number of children: 3   Years of education: 12   Highest education level: Not on file  Occupational History   Occupation: disabled- retail  Tobacco Use   Smoking status: Every Day    Packs/day: 1    Types: Cigarettes    Start date: 08/27/2002   Smokeless tobacco: Never   Tobacco comments:    cut back to 4-5 cigs per day from 1ppd  Vaping Use   Vaping Use: Never used  Substance and Sexual Activity   Alcohol use: Yes    Alcohol/week: 0.0 standard drinks of alcohol    Comment: 2 -40oz daily,  24oz daily.     Drug use: No   Sexual activity: Not Currently    Partners: Male    Birth control/protection: None  Other Topics Concern   Not on file  Social History Narrative   Health Care POA:    Emergency Contact: mother, Elmer Bales (h) [phone]   End of Life Plan:    Who lives with you: self   Any pets: none   Diet: Pt reports not eating very much.  Drinks juice and alcohol most days.    Exercise: Pt has not regular exercise routine.   Seatbelts: Pt reports wearing seatbelt when in vehicles.    Sun Exposure/Protection:    Hobbies: watching TV   Lives with son in a one story home.  Has 3 children.  On disability for arthritis.  Education: high school.    She last worked in 2013 at [employer].     Right hand   Social Determinants of Health   Financial Resource Strain: Low Risk  (01/28/2023)   Overall Financial Resource Strain (CARDIA)    Difficulty of Paying Living Expenses:  Not hard at all  Food Insecurity: No Food Insecurity (01/28/2023)   Hunger Vital Sign    Worried About Running Out of Food in the Last Year: Never true    Ran Out of Food in the Last Year: Never true  Transportation Needs: No  Transportation Needs (01/28/2023)   PRAPARE - Administrator, Civil Service (Medical): No    Lack of Transportation (Non-Medical): No  Physical Activity: Inactive (01/28/2023)   Exercise Vital Sign    Days of Exercise per Week: 0 days    Minutes of Exercise per Session: 0 min  Stress: No Stress Concern Present (01/28/2023)   [employer] of Occupational Health - Occupational Stress Questionnaire    Feeling of Stress : Not at all  Social Connections: Moderately Isolated (01/28/2023)   Social Connection and Isolation Panel [NHANES]    Frequency of Communication with Friends and Family: More than three times a week    Frequency of Social Gatherings with Friends and Family: Once a week    Attends Religious Services: More than 4 times per year    Active Member of [employer] or Organizations: No    Attends Engineer, structural: Never    Marital Status: Never married    Tobacco Counseling Ready to quit: Not Answered Counseling given: Not Answered Tobacco comments: cut back to 4-5 cigs per day from 1ppd   Clinical Intake:  Pre-visit preparation completed: Yes  Pain : No/denies pain  Diabetes: No  How often do you need to have someone help you when you read instructions, pamphlets, or other written materials from your doctor or pharmacy?: 1 - Never  Diabetic?No   Interpreter Needed?: No  Information entered by :: Kandis Fantasia LPN   Activities of Daily Living    01/28/2023    1:15 PM 01/03/2023    9:17 AM  In your present state of health, do you have any difficulty performing the following activities:  Hearing? 0   Vision? 0   Difficulty concentrating or making decisions? 0   Walking or climbing stairs? 0   Dressing or bathing? 0   Doing  errands, shopping? 0 0  Preparing Food and eating ? N   Using the Toilet? N   In the past six months, have you accidently leaked urine? N   Do you have problems with loss of bowel control? N   Managing your Medications? N   Managing your Finances? N   Housekeeping or managing your Housekeeping? N     Patient Care Team: Alicia Amel, MD as PCP - General (Family Medicine) Tonny Bollman, MD as PCP - Cardiology (Cardiology) Glendale Chard, DO as Consulting Physician (Neurology) Kennon Rounds as Physician Assistant (Cardiology)  Indicate any recent Medical Services you may have received from other than [facility] providers in the past year (date may be approximate).     Assessment:   This is a routine wellness examination for [place].  Hearing/Vision screen Hearing Screening - Comments:: Denies hearing difficulties   Vision Screening - Comments:: Wears rx glasses - up to date with routine eye exams with Dr. Dione Booze    Dietary issues and exercise activities discussed: Current Exercise Habits: Home exercise routine, Type of exercise: walking, Time (Minutes): 30, Frequency (Times/Week): 3, Weekly Exercise (Minutes/Week): 90   Goals Addressed             This Visit's Progress    Remain active and independent         Depression Screen    01/28/2023    1:10 PM 01/04/2023   10:28 AM 09/17/2022    2:55 PM 05/17/2022    9:28 AM 12/28/2021   11:10 AM 07/17/2021   11:17 AM 06/01/2021    1:38 PM  PHQ 2/9 Scores  PHQ - 2 Score 0 0 0 0 0 0 0  PHQ- 9 Score 0 0 0 0 0 0 0    Fall Risk    01/28/2023    1:14 PM 09/17/2022    2:54 PM 12/28/2021   11:10 AM 02/03/2021    2:09 PM 01/30/2019    9:47 AM  Fall Risk   Falls in the past year? 0 0 0 0 0  Number falls in past yr: 0 0  0 0  Injury with Fall? 0 0  0 0  Risk for fall due to : No Fall Risks      Follow up Falls prevention discussed;Education provided;Falls evaluation completed    Falls evaluation completed    FALL RISK  PREVENTION PERTAINING TO THE HOME:  Any stairs in or around the home? No  If so, are there any without handrails? No  Home free of loose throw rugs in walkways, pet beds, electrical cords, etc? Yes  Adequate lighting in your home to reduce risk of falls? Yes   ASSISTIVE DEVICES UTILIZED TO PREVENT FALLS:  Life alert? No  Use of a cane, walker or w/c? No  Grab bars in the bathroom? Yes  Shower chair or bench in shower? No  Elevated toilet seat or a handicapped toilet? Yes   TIMED UP AND GO:  Was the test performed? No . Telephonic visit   Cognitive Function:    01/25/2012    3:00 PM  MMSE - Mini Mental State Exam  Orientation to time 5  Orientation to Place 5  Registration 3  Attention/ Calculation 5  Recall 2  Language- name 2 objects 2  Language- repeat 1  Language- follow 3 step command 3  Language- read & follow direction 1  Write a sentence 1  Copy design 1  Total score 29        01/28/2023    1:16 PM  6CIT Screen  What Year? 0 points  What month? 0 points  What time? 0 points  Count back from 20 0 points  Months in reverse 0 points  Repeat phrase 0 points  Total Score 0 points    Immunizations Immunization History  Administered Date(s) Administered   Influenza Split 05/31/2011   Influenza,inj,Quad PF,6+ Mos 07/12/2014, 08/12/2017, 06/02/2018, 06/01/2021, 05/17/2022   PFIZER Comirnaty(Gray Top)Covid-19 Tri-Sucrose Vaccine 07/13/2020   PFIZER(Purple Top)SARS-COV-2 Vaccination 11/27/2019, 12/18/2019   Pfizer Covid-19 Vaccine Bivalent Booster 19yrs & up 07/17/2021   Pneumococcal Polysaccharide-23 04/13/2015   Td 02/24/2006   Tdap 03/02/2022    TDAP status: Up to date  Pneumococcal vaccine status: Up to date  Covid-19 vaccine status: Information provided on how to obtain vaccines.   Qualifies for Shingles Vaccine? Yes   Zostavax completed No   Shingrix Completed?: No.    Education has been provided regarding the importance of this vaccine. Patient  has been advised to call insurance company to determine out of pocket expense if they have not yet received this vaccine. Advised may also receive vaccine at local pharmacy or Health Dept. Verbalized acceptance and understanding.  Screening Tests Health Maintenance  Topic Date Due   Zoster Vaccines- Shingrix (1 of 2) Never done   COVID-19 Vaccine (5 - 2023-24 season) 04/27/2022   INFLUENZA VACCINE  03/28/2023   MAMMOGRAM  01/13/2024   Medicare Annual Wellness (AWV)  01/28/2024   PAP SMEAR-Modifier  05/17/2025   Colonoscopy  10/25/2025   DTaP/Tdap/Td (3 - Td or Tdap) 03/02/2032   Hepatitis C Screening  Completed   HIV Screening  Completed   HPV VACCINES  Aged Out    Health Maintenance  Health Maintenance Due  Topic Date Due   Zoster Vaccines- Shingrix (1 of 2) Never done   COVID-19 Vaccine (5 - 2023-24 season) 04/27/2022    Colorectal cancer screening: Type of screening: Colonoscopy. Completed 10/26/15. Repeat every 10 years  Mammogram status: Completed 01/12/22. Repeat every year  Lung Cancer Screening: (Low Dose CT Chest recommended if Age 45-80 years, 30 pack-year currently smoking OR have quit w/in 15years.) does not qualify.   Lung Cancer Screening Referral: n/a  Additional Screening:  Hepatitis C Screening: does qualify; Completed 06/02/18  Vision Screening: Recommended annual ophthalmology exams for early detection of glaucoma and other disorders of the eye. Is the patient up to date with their annual eye exam?  Yes  Who is the provider or what is the name of the office in which the patient attends annual eye exams? Dr. Dione Booze If pt is not established with a provider, would they like to be referred to a provider to establish care? No .   Dental Screening: Recommended annual dental exams for proper oral hygiene  Community Resource Referral / Chronic Care Management: CRR required this visit?  No   CCM required this visit?  No      Plan:     I have personally  reviewed and noted the following in the patient's chart:   Medical and social history Use of alcohol, tobacco or illicit drugs  Current medications and supplements including opioid prescriptions. Patient is not currently taking opioid prescriptions. Functional ability and status Nutritional status Physical activity Advanced directives List of other physicians Hospitalizations, surgeries, and ER visits in previous 12 months Vitals Screenings to include cognitive, depression, and falls Referrals and appointments  In addition, I have reviewed and discussed with patient certain preventive protocols, quality metrics, and best practice recommendations. A written personalized care plan for preventive services as well as general preventive health recommendations were provided to patient.     Durwin Nora, [place]   09/01/1094   Due to this being a virtual visit, the after visit summary with patients personalized plan was offered to patient via mail or my-chart.  per request, patient was mailed a copy of AVS.  Nurse Notes: No concerns       I have reviewed this visit and agree with the documentation.  Marshall L Chambliss

## 2023-01-30 DIAGNOSIS — Z9889 Other specified postprocedural states: Secondary | ICD-10-CM | POA: Diagnosis not present

## 2023-01-30 DIAGNOSIS — E210 Primary hyperparathyroidism: Secondary | ICD-10-CM | POA: Diagnosis not present

## 2023-01-30 DIAGNOSIS — Z9089 Acquired absence of other organs: Secondary | ICD-10-CM | POA: Diagnosis not present

## 2023-02-01 ENCOUNTER — Ambulatory Visit (INDEPENDENT_AMBULATORY_CARE_PROVIDER_SITE_OTHER): Payer: 59 | Admitting: Student

## 2023-02-01 ENCOUNTER — Other Ambulatory Visit: Payer: Self-pay | Admitting: Student

## 2023-02-01 VITALS — BP 130/49 | HR 64 | Ht 64.0 in | Wt 279.8 lb

## 2023-02-01 DIAGNOSIS — E210 Primary hyperparathyroidism: Secondary | ICD-10-CM

## 2023-02-01 DIAGNOSIS — R600 Localized edema: Secondary | ICD-10-CM

## 2023-02-01 DIAGNOSIS — R0609 Other forms of dyspnea: Secondary | ICD-10-CM | POA: Diagnosis not present

## 2023-02-01 DIAGNOSIS — R635 Abnormal weight gain: Secondary | ICD-10-CM

## 2023-02-01 MED ORDER — FUROSEMIDE 40 MG PO TABS
40.0000 mg | ORAL_TABLET | Freq: Every day | ORAL | 0 refills | Status: DC
Start: 2023-02-01 — End: 2023-02-01

## 2023-02-01 NOTE — Patient Instructions (Addendum)
[name], [name] to see you! We need to do some digging to get to the bottom of what caused your weight to jump up like this. It could be normal or an adverse response to the surgery, we'll get some labs to tell us. We will also take a look at your heart. One of the labs will give [place] a general idea of if your heart may be involved, AND we will get an echocardiogram done. It's been four years since your last and it was normal at that time. We will call you to schedule this.  Plan to double your dose of Lasix to 40mg  until you see me again in two weeks.    Kendra Mccoy, MD

## 2023-02-01 NOTE — Progress Notes (Unsigned)
    SUBJECTIVE:   CHIEF COMPLAINT / HPI:   Weight Loss Efforts Last seen by me about a month ago for assistance with weight loss. We added Naltrexone to the Wellbutrin she was already on in hopes this would help with food cravings as well as cigarette cravings.  In the interval since her last visit she had a parathyroidectomy with Dr. Gerrit Friends which went well per with her report and his most recent notes.  Unfortunately, today she presents with a relatively sharp regain of 13 pounds since our last visit.  She denies any decrease in activity or dietary indiscretion in the intervening period.  She also endorses some increase in her bilateral lower extremity edema that was previously controlled on 20 mg of Lasix daily.  She has been taking double doses of Lasix "here and there" without response.  She also has some shortness of breath with activity.  Denies any chest pain, tightness, pressure. She continues to smoke about a half a pack of cigarettes daily.  OBJECTIVE:   BP (!) 130/49   Pulse 64   Ht 5\' 4"  (1.626 m)   Wt 279 lb 12.8 oz (126.9 kg)   LMP 02/21/2015   SpO2 100%   BMI 48.03 kg/m   Physical Exam Constitutional:      General: She is not in acute distress. HENT:     Mouth/Throat:     Mouth: Mucous membranes are moist.  Eyes:     Conjunctiva/sclera: Conjunctivae normal.  Cardiovascular:     Rate and Rhythm: Normal rate and regular rhythm.     Heart sounds: No murmur heard. Pulmonary:     Effort: Pulmonary effort is normal.     Comments: Coarse sounds throughout, consistent with a lifetime of tobacco abuse but no wheezes or foci of crackles suggestive of pneumonia. Musculoskeletal:     Comments: 1-2+ pitting edema to the knees bilaterally  Neurological:     General: No focal deficit present.  Psychiatric:        Mood and Affect: Mood normal.      ASSESSMENT/PLAN:   Weight gain finding Relatively rapid weight gain of 13 pounds in the past month in the setting of recent  parathyroidectomy.  Differential for these findings includes physiologic response to parathyroidectomy versus dietary indiscretion versus thyroid damage to related to the surgery versus CHF.  Her lower extremity edema was previously well-controlled on 20 mg of Lasix daily which is no longer helping, perhaps her Lasix threshold was now increased. -Will increase her daily Lasix from 20 milligrams daily to 40 mg daily until I follow-up with her in 1 to 2 weeks -TSH, CMP, BNP, phosphorus -Echocardiogram complete, last study was about 4 years ago and was normal at that time     Eliezer Mccoy, MD [facility]

## 2023-02-02 LAB — COMPREHENSIVE METABOLIC PANEL
ALT: 15 IU/L (ref 0–32)
AST: 14 IU/L (ref 0–40)
Albumin/Globulin Ratio: 1.5 (ref 1.2–2.2)
Albumin: 3.8 g/dL (ref 3.8–4.9)
Alkaline Phosphatase: 85 IU/L (ref 44–121)
BUN/Creatinine Ratio: 13 (ref 9–23)
BUN: 11 mg/dL (ref 6–24)
Bilirubin Total: 0.3 mg/dL (ref 0.0–1.2)
CO2: 21 mmol/L (ref 20–29)
Calcium: 9.9 mg/dL (ref 8.7–10.2)
Chloride: 108 mmol/L — ABNORMAL HIGH (ref 96–106)
Creatinine, Ser: 0.86 mg/dL (ref 0.57–1.00)
Globulin, Total: 2.5 g/dL (ref 1.5–4.5)
Glucose: 89 mg/dL (ref 70–99)
Potassium: 4.4 mmol/L (ref 3.5–5.2)
Sodium: 141 mmol/L (ref 134–144)
Total Protein: 6.3 g/dL (ref 6.0–8.5)
eGFR: 79 mL/min/{1.73_m2} (ref >59)

## 2023-02-02 LAB — PHOSPHORUS: Phosphorus: 3.4 mg/dL (ref 3.0–4.3)

## 2023-02-02 LAB — TSH RFX ON ABNORMAL TO FREE T4: TSH: 2.32 u[IU]/mL (ref 0.450–4.500)

## 2023-02-02 LAB — BRAIN NATRIURETIC PEPTIDE: BNP: 104.5 pg/mL — ABNORMAL HIGH (ref 0.0–100.0)

## 2023-02-02 NOTE — Assessment & Plan Note (Signed)
Relatively rapid weight gain of 13 pounds in the past month in the setting of recent parathyroidectomy.  Differential for these findings includes physiologic response to parathyroidectomy versus dietary indiscretion versus thyroid damage to related to the surgery versus CHF.  Her lower extremity edema was previously well-controlled on 20 mg of Lasix daily which is no longer helping, perhaps her Lasix threshold was now increased. -Will increase her daily Lasix from 20 milligrams daily to 40 mg daily until I follow-up with her in 1 to 2 weeks -TSH, CMP, BNP, phosphorus -Echocardiogram complete, last study was about 4 years ago and was normal at that time

## 2023-02-05 ENCOUNTER — Telehealth: Payer: Self-pay

## 2023-02-05 DIAGNOSIS — K8050 Calculus of bile duct without cholangitis or cholecystitis without obstruction: Secondary | ICD-10-CM

## 2023-02-05 NOTE — Telephone Encounter (Signed)
Patient calls nurse line requesting abdominal ultrasound.   She states that she has discussed abdominal pain with PCP at previous appointments and would like [place] to determine if she has gallstones.  I was not able to find documentation of abdominal pain in last office note and advised patient that she may need an additional appointment for further evaluation and imaging orders.   Please advise how you would like patient to proceed.   Thanks.   Veronda Prude, RN

## 2023-02-09 NOTE — Telephone Encounter (Signed)
Called patient to discuss. She describes RUQ abdominal pain that is associated with food intake. Worse with fatty foods. Typically lasts 10-90 minutes though sometimes has episodes that last hours and interfere with sleep. Accompanied by nausea. No fevers. Of note, had a CMP nine days ago that was normal. Given history of hypercalcemia 2/2 hyperparathyroidism, it is certainly possible that she has developed some gallstones that may be causing this biliary colic. I am happy to obtain a RUQ [place] to evaluate further.  Eliezer Mccoy, MD

## 2023-02-18 ENCOUNTER — Ambulatory Visit (HOSPITAL_COMMUNITY): Admission: RE | Admit: 2023-02-18 | Payer: 59 | Source: Ambulatory Visit

## 2023-02-19 ENCOUNTER — Encounter: Payer: Self-pay | Admitting: Student

## 2023-02-19 ENCOUNTER — Ambulatory Visit (INDEPENDENT_AMBULATORY_CARE_PROVIDER_SITE_OTHER): Payer: 59 | Admitting: Student

## 2023-02-19 ENCOUNTER — Other Ambulatory Visit: Payer: Self-pay

## 2023-02-19 VITALS — BP 130/63 | HR 74 | Ht 64.0 in | Wt 274.4 lb

## 2023-02-19 DIAGNOSIS — F17200 Nicotine dependence, unspecified, uncomplicated: Secondary | ICD-10-CM

## 2023-02-19 DIAGNOSIS — R635 Abnormal weight gain: Secondary | ICD-10-CM

## 2023-02-19 NOTE — Patient Instructions (Signed)
Ms. Backes,  I want to make sure that we're treating the right thing. Before we just go increasing your lasix, let's see what your echocardiogram looks like.  I will call you when it has resulted and we'll have our next steps.   Please make an appointment with Dr. Raymondo Band to talk about next steps for quitting smoking.  Eliezer Mccoy, MD

## 2023-02-20 NOTE — Assessment & Plan Note (Signed)
Patient expresses confidence in her ability to quit, specifically with Dr. Macky Lower support. Plans to make an appointment with him on her way out today.  - Continue Chantix 0.5mg  daily (atypical dosing but she affirms it seems to help with cravings) - Continue bupropion XL 150mg  daily - Continue naltrexone 25mg  daily

## 2023-02-20 NOTE — Progress Notes (Addendum)
    SUBJECTIVE:   CHIEF COMPLAINT / HPI:   Weight Gain After Surgery Seen by me three weeks ago and noted to have ~15lbs weight gain over ~1.5 weeks after parathyroidectomy. Also with increased LE edema. Unclear cause. Thankfully lab workup at last visit was largely unremarkable, BNP minimally elevated to 104.5 (though with her obesity may be artificially suppressed). TSH and Ca were both WNL. We doubled her Lasix dose from 20mg  daily to 40mg  daily with loss of about 5lbs, though she remains with BLE edema. Denies any increased SOB or pulmonary sx otherwise.   Smoking Continues to smoke 1/2 ppd. Down from 1ppd. Desires cessation. Has found Dr. Macky Lower counseling to be helpful in the past. Remains on Chantix, bupropion, and naltrexone.   OBJECTIVE:   BP 130/63   Pulse 74   Ht 5\' 4"  (1.626 m)   Wt 274 lb 6.4 oz (124.5 kg)   LMP 02/21/2015   SpO2 100%   BMI 47.10 kg/m   Physical Exam Constitutional:      General: She is not in acute distress. HENT:     Mouth/Throat:     Mouth: Mucous membranes are moist.  Cardiovascular:     Rate and Rhythm: Normal rate and regular rhythm.     Heart sounds: No murmur heard. Pulmonary:     Effort: Pulmonary effort is normal.     Comments: Coarse throughout but without crackles or wheeze  Musculoskeletal:     Right lower leg: Edema (1+) present.     Left lower leg: Edema (1+) present.  Skin:    General: Skin is warm and dry.  Neurological:     General: No focal deficit present.      ASSESSMENT/PLAN:   Weight gain finding Lab workup unrevealing. Reassured by normal thyroid and Ca levels given recent parathyroidectomy. However, cause of her LE edema and rapid weight gain remain unclear. I have the highest suspicion this is cardiac-mediated. Perhaps her Lasix threshold is >40mg  and she just needs a dose increase? However, favor awaiting echo results before making any next steps. Echo is scheduled for next Monday.  - F/u echo in 1 week -  Continue Lasix 40mg  daily   TOBACCO ABUSE Patient expresses confidence in her ability to quit, specifically with Dr. Macky Lower support. Plans to make an appointment with him on her way out today.  - Continue Chantix 0.5mg  daily (atypical dosing but she affirms it seems to help with cravings) - Continue bupropion XL 150mg  daily - Continue naltrexone 25mg  daily      J Dorothyann Gibbs, MD [facility]

## 2023-02-20 NOTE — Assessment & Plan Note (Signed)
Lab workup unrevealing. Reassured by normal thyroid and Ca levels given recent parathyroidectomy. However, cause of her LE edema and rapid weight gain remain unclear. I have the highest suspicion this is cardiac-mediated. Perhaps her Lasix threshold is >40mg  and she just needs a dose increase? However, favor awaiting echo results before making any next steps. Echo is scheduled for next Monday.  - F/u echo in 1 week - Continue Lasix 40mg  daily

## 2023-02-25 ENCOUNTER — Ambulatory Visit (HOSPITAL_COMMUNITY)
Admission: RE | Admit: 2023-02-25 | Discharge: 2023-02-25 | Disposition: A | Discharge status: Home / Self Care | Payer: 59 | Source: Ambulatory Visit | Attending: Family Medicine | Admitting: Family Medicine

## 2023-02-25 DIAGNOSIS — R0600 Dyspnea, unspecified: Secondary | ICD-10-CM | POA: Insufficient documentation

## 2023-02-25 DIAGNOSIS — R600 Localized edema: Secondary | ICD-10-CM | POA: Diagnosis not present

## 2023-02-25 DIAGNOSIS — R0609 Other forms of dyspnea: Secondary | ICD-10-CM | POA: Insufficient documentation

## 2023-02-25 DIAGNOSIS — I10 Essential (primary) hypertension: Secondary | ICD-10-CM | POA: Diagnosis not present

## 2023-02-25 LAB — ECHOCARDIOGRAM COMPLETE
AR max vel: 2.57 cm2
AV Area VTI: 2.65 cm2
AV Area mean vel: 2.53 cm2
AV Mean grad: 3 mmHg
AV Peak grad: 6.5 mmHg
Ao pk vel: 1.27 m/s
Area-P 1/2: 4.17 cm2
Calc EF: 63.9 %
S' Lateral: 2.8 cm
Single Plane A2C EF: 65.4 %
Single Plane A4C EF: 62.8 %

## 2023-02-26 ENCOUNTER — Encounter: Payer: Self-pay | Admitting: Pharmacist

## 2023-02-26 ENCOUNTER — Ambulatory Visit (INDEPENDENT_AMBULATORY_CARE_PROVIDER_SITE_OTHER): Payer: 59 | Admitting: Pharmacist

## 2023-02-26 VITALS — BP 122/74 | HR 77 | Wt 276.8 lb

## 2023-02-26 DIAGNOSIS — F17200 Nicotine dependence, unspecified, uncomplicated: Secondary | ICD-10-CM | POA: Diagnosis not present

## 2023-02-26 MED ORDER — VARENICLINE TARTRATE 1 MG PO TABS
1.0000 mg | ORAL_TABLET | Freq: Two times a day (BID) | ORAL | 1 refills | Status: DC
Start: 2023-02-26 — End: 2023-11-07

## 2023-02-26 NOTE — Assessment & Plan Note (Signed)
Tobacco use disorder with severe nicotine dependence of > 30 years duration in a patient who is fair candidate for success because of current level of motivation.    -Continued bupropion 150mg  daily. Patient with no past medical history of seizures. Patient counseled on purpose, proper use, and potential adverse effects, including insomnia, and potential change in mood.   -Increased dose of varenicline to 1mg  BID with food Patient counseled on purpose, proper use, and potential adverse effects, including GI upset.  -Set goal in take for 4 weeks to be 1 ppd or less.

## 2023-02-26 NOTE — Patient Instructions (Signed)
Tobacco Patient Instructions  Quitting smoking is one of the most important decisions you can make for your current and future health. Consider what you dislike about smoking and how quitting could personally benefit you. Try to cut down. Aim for reducing the amount you smoke to 1 pack per day by the end of July.   My target quit date is: Soon  Starting today, Be a Quitter!  Remind yourself why you want to quit.  Delay your first cigarette of the day for as long as possible.  Start cleaning out all pockets, drawers, and your car of cigarettes.  Getting Through the Cravings Once You Are Smoke Free: Each craving will last about 10 minutes, whether or not you smoke. Here's how to get through the cravings without cigarettes:  DELAY: Tell yourself that you'll wait for the next craving. Do it every time! DEEP BREATHS: One reason smoking feels good is because you breathe in deeply to inhale. Take four slow, deep breaths and feel the relaxation without the hamful effects of cigarettes. DRINK WATER: Drink a glass of cool water. It will give your hands and mouth something to do and will help flush the nicotine out of your system faster. DIVERT: Do something else -- brush your teeth, take a walk, call a friend who can offer you support. Just moving onto something other than thinking about cigarettes will move you through the craving.   Frequently Asked Questions  What can I do when I get the urge to smoke? To get through the urge to smoke, try the following:  Review your reasons for quitting and think of all the benefits to your health, your finances, and your family.  Remind yourself that there is no such thing as just one cigarette -- or even one puff.  Ride out the desire to smoke. Use the 4 Os -- Delay, Deep Breaths, Drink Water and Divert to get you through. The craving will go away eventually. Do not fool yourself into thinking you can have just one cigarette.  Any tips on how to deal with  stress? Stress is a natural part of life. The key is to deal with it without reaching for a cigarette. Taking deep breaths, counting backwards from 10 and asking yourself 1-how big a deal is this?"  Writing down your feelings, talking with a friend and doing things like positive self-talk and meditation are some other ways that people deal with daily stress.  What if I start smoking again? Slips happen. Most people try to quit smoking a few times before they are successful. Don't beat yourself up if this happens to you! Ask yourself if this was a slip or a relapse. A slip is a one-time mistake that is quickly corrected. A relapse is going back to your old smoking habits.   If you slip, don't give up. Think of it as a learning experience. Ask yourself what went wrong and renew your commitment to staying away from smoking for good.  If you relapse, try not to get discouraged. Ask yourself the question "What caused me to start smoking?" Figure out what helped you and what didn't when you tried to quit. Knowing why you relapsed is useful information for your next attempt to quit.

## 2023-02-26 NOTE — Progress Notes (Signed)
   S:   Chief Complaint  Patient presents with   Medication Management    Tobacco Intake Reduction   57 y.o. female who presents for assistance with tobacco intake reduction. She ambulates without assistance.  She is accompanied by her granddaughter.  Patient was referred and last seen by Primary Care Provider, Dr. Marisue Humble on 02/19/2023   Age when started using tobacco on a daily basis 57 years of age. Brand smoked [place]. Number of cigarettes/day currently is 2-4 ppd. Estimated nicotine content per cigarette (mg) > 1 mg.  [place]  Estimated nicotine intake per day > 40mg .   Smokes intermittently at night.    Medications used icurrently: varenicline 0.5mg  BID with meals  Most common triggers to use tobacco include; habit, anxiety   Motivation to quit: health  O:  Review of Systems  Cardiovascular:  Positive for leg swelling.  Psychiatric/Behavioral:  The patient is nervous/anxious.   All other systems reviewed and are negative.   Physical Exam Vitals reviewed.  Constitutional:      Appearance: Normal appearance.  Pulmonary:     Effort: Pulmonary effort is normal.  Musculoskeletal:     Right lower leg: Edema present.     Left lower leg: Edema present.  Neurological:     Mental Status: She is alert.  Psychiatric:        Mood and Affect: Mood normal.        Behavior: Behavior normal.        Thought Content: Thought content normal.        Judgment: Judgment normal.     A/P: Tobacco use disorder with severe nicotine dependence of > 30 years duration in a patient who is fair candidate for success because of current level of motivation.    -Continued bupropion 150mg  daily. Patient with no past medical history of seizures. Patient counseled on purpose, proper use, and potential adverse effects, including insomnia, and potential change in mood.   -Increased dose of varenicline to 1mg  BID with food Patient counseled on purpose, proper use, and potential adverse effects,  including GI upset.  -Set goal in take for 4 weeks to be 1 ppd or less.   Written patient instructions provided. Patient verbalized understanding of treatment plan.  Total time in face to face counseling 26 minutes.    Follow-up:  Pharmacist 5 weeks. PCP clinic visit PRN

## 2023-02-27 NOTE — Progress Notes (Signed)
Reviewed and agree with Dr Koval's plan.   

## 2023-03-05 ENCOUNTER — Ambulatory Visit: Payer: 59 | Admitting: Podiatry

## 2023-03-06 ENCOUNTER — Other Ambulatory Visit: Payer: Self-pay | Admitting: Student

## 2023-03-14 ENCOUNTER — Other Ambulatory Visit: Payer: 59

## 2023-03-14 DIAGNOSIS — L669 Cicatricial alopecia, unspecified: Secondary | ICD-10-CM | POA: Diagnosis not present

## 2023-03-14 DIAGNOSIS — L299 Pruritus, unspecified: Secondary | ICD-10-CM | POA: Diagnosis not present

## 2023-03-20 ENCOUNTER — Ambulatory Visit (INDEPENDENT_AMBULATORY_CARE_PROVIDER_SITE_OTHER): Payer: 59 | Admitting: Orthopaedic Surgery

## 2023-03-20 ENCOUNTER — Encounter: Payer: Self-pay | Admitting: Orthopaedic Surgery

## 2023-03-20 VITALS — Wt 277.0 lb

## 2023-03-20 DIAGNOSIS — G8929 Other chronic pain: Secondary | ICD-10-CM | POA: Diagnosis not present

## 2023-03-20 DIAGNOSIS — M25562 Pain in left knee: Secondary | ICD-10-CM | POA: Diagnosis not present

## 2023-03-20 DIAGNOSIS — M25561 Pain in right knee: Secondary | ICD-10-CM | POA: Diagnosis not present

## 2023-03-20 DIAGNOSIS — M1711 Unilateral primary osteoarthritis, right knee: Secondary | ICD-10-CM

## 2023-03-20 DIAGNOSIS — M1712 Unilateral primary osteoarthritis, left knee: Secondary | ICD-10-CM | POA: Diagnosis not present

## 2023-03-20 NOTE — Progress Notes (Signed)
The patient is someone I have not seen in a year.  She has been on a significant weight loss journey and when I saw her in June 2023 her BMI was over 51.  She continues to have severe debilitating bilateral knee pain and well-documented severe arthritis in both her knees.  Her x-rays in the past that show bone-on-bone wear with varus malalignment of both knees and osteophytes in all 3 compartments.  Her BMI today is down to 47.55.  She has had thyroid surgery.  She was on a weight loss medication but insurance no longer covers this and it is too expensive for her.  Her knee pain is debilitating.  She needs new [place] transportation and transport authority to allow her to take the [facility] bus for appointments.  Steroid injections have gotten to where they do not help at all and she has had numerous injections.  Even though she has Medicaid she is part of [facility] and this precludes [place] from performing a surgery into her weight loss is less.  On exam surprisingly her knees are not big knees and her thighs are not big.  She is someone that if there was none insurance stipulations that I would consider replacing her knees.  She has good range of motion both knees but there is varus malalignment is correctable.  She has global tenderness and pain and again well-documented severe end-stage arthritis.  We will fill out paperwork today for her for the [facility] bus.  I would like to see her back in 3 months with a repeat weight and BMI calculation.  She agrees with this treatment plan.

## 2023-04-01 ENCOUNTER — Other Ambulatory Visit: Payer: 59

## 2023-04-03 ENCOUNTER — Ambulatory Visit: Payer: 59 | Admitting: Pharmacist

## 2023-04-04 ENCOUNTER — Other Ambulatory Visit: Payer: Self-pay | Admitting: Student

## 2023-04-04 DIAGNOSIS — K219 Gastro-esophageal reflux disease without esophagitis: Secondary | ICD-10-CM

## 2023-04-10 ENCOUNTER — Other Ambulatory Visit: Payer: 59

## 2023-05-13 ENCOUNTER — Ambulatory Visit
Admission: RE | Admit: 2023-05-13 | Discharge: 2023-05-13 | Disposition: A | Discharge status: Home / Self Care | Payer: 59 | Source: Ambulatory Visit | Attending: Family Medicine | Admitting: Family Medicine

## 2023-05-13 DIAGNOSIS — K8050 Calculus of bile duct without cholangitis or cholecystitis without obstruction: Secondary | ICD-10-CM

## 2023-05-13 DIAGNOSIS — K769 Liver disease, unspecified: Secondary | ICD-10-CM | POA: Diagnosis not present

## 2023-05-16 ENCOUNTER — Other Ambulatory Visit: Payer: Self-pay | Admitting: Orthopaedic Surgery

## 2023-05-16 DIAGNOSIS — Z0000 Encounter for general adult medical examination without abnormal findings: Secondary | ICD-10-CM

## 2023-05-16 DIAGNOSIS — Z9889 Other specified postprocedural states: Secondary | ICD-10-CM | POA: Insufficient documentation

## 2023-05-16 DIAGNOSIS — Z9089 Acquired absence of other organs: Secondary | ICD-10-CM | POA: Insufficient documentation

## 2023-05-20 DIAGNOSIS — Z9889 Other specified postprocedural states: Secondary | ICD-10-CM | POA: Diagnosis not present

## 2023-05-20 DIAGNOSIS — E559 Vitamin D deficiency, unspecified: Secondary | ICD-10-CM | POA: Diagnosis not present

## 2023-05-20 DIAGNOSIS — Z9089 Acquired absence of other organs: Secondary | ICD-10-CM | POA: Diagnosis not present

## 2023-06-12 ENCOUNTER — Ambulatory Visit
Admission: RE | Admit: 2023-06-12 | Discharge: 2023-06-12 | Disposition: A | Discharge status: Home / Self Care | Payer: 59 | Source: Ambulatory Visit | Attending: Orthopaedic Surgery | Admitting: Orthopaedic Surgery

## 2023-06-12 DIAGNOSIS — Z1231 Encounter for screening mammogram for malignant neoplasm of breast: Secondary | ICD-10-CM | POA: Diagnosis not present

## 2023-06-12 DIAGNOSIS — Z0000 Encounter for general adult medical examination without abnormal findings: Secondary | ICD-10-CM

## 2023-06-24 ENCOUNTER — Encounter: Payer: Self-pay | Admitting: Orthopaedic Surgery

## 2023-06-24 ENCOUNTER — Ambulatory Visit (INDEPENDENT_AMBULATORY_CARE_PROVIDER_SITE_OTHER): Payer: 59 | Admitting: Orthopaedic Surgery

## 2023-06-24 VITALS — Ht 64.0 in | Wt 270.0 lb

## 2023-06-24 DIAGNOSIS — M25562 Pain in left knee: Secondary | ICD-10-CM

## 2023-06-24 DIAGNOSIS — M1711 Unilateral primary osteoarthritis, right knee: Secondary | ICD-10-CM

## 2023-06-24 DIAGNOSIS — G8929 Other chronic pain: Secondary | ICD-10-CM | POA: Diagnosis not present

## 2023-06-24 DIAGNOSIS — M1712 Unilateral primary osteoarthritis, left knee: Secondary | ICD-10-CM | POA: Diagnosis not present

## 2023-06-24 DIAGNOSIS — M25561 Pain in right knee: Secondary | ICD-10-CM

## 2023-06-24 NOTE — Progress Notes (Signed)
The patient continues on a weight loss journey as a relates to her morbid obesity.  She has known severe arthritis in both her knees and is in need of knee replacement surgery.  She does not have very large knees but her insurance company definitely requires her BMI to be below 40 before proceeding with any knee replacement surgery.  She has had multiple injections in both knees over the years and they have gotten to where they do not work for her at all.  Her BMI today is 46.35.  Both knees are assessed and have varus malalignment that is correctable.  Both knees have patellofemoral crepitation throughout the arc of motion and pain throughout the arc of motion with all 3 compartments hurting especially the medial side of both knees.  There is not a large soft tissue envelope around either knee.  Will see her back in 3 months as she continues on this weight loss journey for repeat weight and BMI calculation.  There is nothing else unfortunately that I can offer right now.

## 2023-06-27 ENCOUNTER — Ambulatory Visit: Payer: 59 | Admitting: Student

## 2023-07-01 ENCOUNTER — Encounter: Payer: Self-pay | Admitting: Student

## 2023-07-01 ENCOUNTER — Ambulatory Visit (INDEPENDENT_AMBULATORY_CARE_PROVIDER_SITE_OTHER): Payer: 59 | Admitting: Student

## 2023-07-01 DIAGNOSIS — Z6841 Body Mass Index (BMI) 40.0 and over, adult: Secondary | ICD-10-CM

## 2023-07-01 DIAGNOSIS — Z23 Encounter for immunization: Secondary | ICD-10-CM | POA: Diagnosis not present

## 2023-07-01 DIAGNOSIS — F17200 Nicotine dependence, unspecified, uncomplicated: Secondary | ICD-10-CM | POA: Diagnosis not present

## 2023-07-01 MED ORDER — SEMAGLUTIDE(0.25 OR 0.5MG/DOS) 2 MG/1.5ML ~~LOC~~ SOPN
0.2500 mg | PEN_INJECTOR | SUBCUTANEOUS | 3 refills | Status: DC
Start: 2023-07-01 — End: 2023-10-29

## 2023-07-01 NOTE — Assessment & Plan Note (Signed)
Motivated to quit.  Seems more optimistic about this today than I have seen her in the past. -Continue Chantix, naltrexone, Wellbutrin daily -CT lung cancer screening -Follow-up in 4 weeks

## 2023-07-01 NOTE — Patient Instructions (Addendum)
I want you to download the Cronometer app and log everything that you eat for the next three weeks. I want you to try to increase your protein intake! I'm going to give you a goal of AT LEAST 150g protein per day.  I ALSO want you to target AT LEAST 28g of fiber each day.   1 egg or 1 ounce of meat, fish, poultry, or cheese provides about 7 grams of protein.  8 oz of milk or 2 tbsp peanut butter provides 8 grams of protein. 1 cup (8 oz) of most beans provides 15 grams of protein.    Yogurts vary greatly, but [place] yogurt is highest in protein.  NOTE: The size of a deck of cards is equal to about 3-4 ounces of meat, fish, or poultry.   In the meantime, we are also going to try and get you back on [facility] for weight loss. I will send in this prescription and we will see what your insurance says.   WE will get you your flu and COVID shots today.   [facility] will call you to discuss scheduling your lung cancer screening.   Eliezer Mccoy, MD

## 2023-07-01 NOTE — Assessment & Plan Note (Signed)
Obesity with a BMI greater than 45 complicated by hyperlipidemia and osteoarthritis of the bilateral knees.  Had an excellent sponsor to semaglutide, unfortunately this was discontinued after her insurance quit paying for it.  Long discussion today about diet changes.  I also believe strongly that she would benefit from getting back on a GLP-1 agent.  Helping her get to a BMI less than 40 kg/m would be quite helpful and that then she could have her knees replaced and get back to full activity which would help with her weight, dyslipidemia, CAD, and improve her overall health tremendously. -She will download the Cronometer app -Target >150g protein daily -Target >28g fiber daily -Re-start semaglutide 0.25mg  weekly, quick uptitration as she has tolerated high doses in the past -F/u in 4 weeks

## 2023-07-01 NOTE — Progress Notes (Signed)
    SUBJECTIVE:   CHIEF COMPLAINT / HPI:   Weight Loss Ms. Silberstein comes in today to discuss ongoing weight loss efforts.  She is down 45 pounds from her high of 311 pounds last May.  Unfortunately, at the beginning of this year her insurance stopped paying for her semaglutide.  Her weight loss has more or less plateaued since that time.  Today it is 266 pounds, stable since June. Her motivation to lose weight is to finally get her knees replaced as chronic knee pain is detrimental to her quality of life. Her insurance company has required that we get her BMI <40 before they will pay for her knee replacement surgery. Long discussion today about her eating patterns.  Sounds like she is really only eating 1 meal a day so has limited protein intake.  She also eats a fairly high carbohydrate diet as her ancestral way of eating involves eating rice and other grains on a daily basis.  Tobacco Abuse Smoking 1.5 packs/day.  Down from 2 packs/day.  She tells me she is motivated to quit today.  Her grandson is with her and tells her that "you better quit."  She is already on Chantix, naltrexone, and Wellbutrin.  She feels that she has the resources that she needs to quit and just needs to make the decision and stick with that.  She has never had a screening CT of her chest.  PERTINENT  PMH / PSH: CAD, OA of the bilateral knees  OBJECTIVE:   BP 120/84   Pulse 68   Ht 5\' 4"  (1.626 m)   Wt 266 lb 3.2 oz (120.7 kg)   LMP 02/21/2015   SpO2 100%   BMI 45.69 kg/m     ASSESSMENT/PLAN:   Morbid obesity (HCC) Obesity with a BMI greater than 45 complicated by hyperlipidemia and osteoarthritis of the bilateral knees.  Had an excellent sponsor to semaglutide, unfortunately this was discontinued after her insurance quit paying for it.  Long discussion today about diet changes.  I also believe strongly that she would benefit from getting back on a GLP-1 agent.  Helping her get to a BMI less than 40 kg/m would  be quite helpful and that then she could have her knees replaced and get back to full activity which would help with her weight, dyslipidemia, CAD, and improve her overall health tremendously. -She will download the Cronometer app -Target >150g protein daily -Target >28g fiber daily -Re-start semaglutide 0.25mg  weekly, quick uptitration as she has tolerated high doses in the past -F/u in 4 weeks   TOBACCO ABUSE Motivated to quit.  Seems more optimistic about this today than I have seen her in the past. -Continue Chantix, naltrexone, Wellbutrin daily -CT lung cancer screening -Follow-up in 4 weeks     J Dorothyann Gibbs, MD [facility]

## 2023-07-03 ENCOUNTER — Other Ambulatory Visit: Payer: Self-pay | Admitting: Student

## 2023-07-03 DIAGNOSIS — M545 Low back pain, unspecified: Secondary | ICD-10-CM

## 2023-07-03 DIAGNOSIS — E782 Mixed hyperlipidemia: Secondary | ICD-10-CM

## 2023-07-11 ENCOUNTER — Ambulatory Visit
Admission: RE | Admit: 2023-07-11 | Discharge: 2023-07-11 | Disposition: A | Discharge status: Home / Self Care | Payer: 59 | Source: Ambulatory Visit | Attending: Family Medicine | Admitting: Family Medicine

## 2023-07-11 DIAGNOSIS — F17200 Nicotine dependence, unspecified, uncomplicated: Secondary | ICD-10-CM

## 2023-07-11 DIAGNOSIS — F17210 Nicotine dependence, cigarettes, uncomplicated: Secondary | ICD-10-CM | POA: Diagnosis not present

## 2023-07-31 ENCOUNTER — Ambulatory Visit: Payer: Self-pay | Admitting: Student

## 2023-08-06 ENCOUNTER — Encounter: Payer: Self-pay | Admitting: Student

## 2023-08-18 NOTE — Progress Notes (Unsigned)
    SUBJECTIVE:   CHIEF COMPLAINT / HPI:   Weight Loss Efforts Here for follow-up.  Previously was on Ozempic and lost 33 pounds.  Unfortunately her insurance stopped paying for this her weight loss is since stalled.  At her last visit we tried to restart her Ozempic but she never heard anything back from the pharmacy or pharmacy team about this.  We also encouraged her to download the Cronometer app and target >150g protein and >28g fiber daily.  Unfortunately, her grandson never helped her download the app and while she has tried to make some of these dietary changes she admits to imperfect adherence.  Her motivation for weight loss is getting her weight down to a BMI less than 40 so that she can have surgery for her bilateral knee osteoarthritis.  Smoking  She is pleased to share that she has cut back significantly on her smoking.  Is now smoking just a half a pack a day down from 1 and half packs per day last time I saw her.    OBJECTIVE:   BP 132/76   Pulse 89   Ht 5\' 4"  (1.626 m)   Wt 268 lb 9.6 oz (121.8 kg)   LMP 02/21/2015   SpO2 98%   BMI 46.11 kg/m   General: Well-appearing and NAD HENT: MMM Cardio: RRR without murmur Pulm: Normal WOB on RA, occasional smokers cough Abd: Non-tender, obese   ASSESSMENT/PLAN:   Morbid obesity (HCC) Obesity complicated by hyperlipidemia and osteoarthritis of the bilateral knees.  Unfortunately, we are unable to get her semaglutide after our last visit.  We will try this again with the assistance of our pharmacy team.  She has both Medicare and Medicaid so I expect we can get it covered by 1 of these 2 insurances.  Her grandson will help her download the app. - START semaglutide 0.25mg /week - Download Cronometer App - Target >150g protein daily - Target >28g fiber daily  TOBACCO ABUSE She has made tremendous progress here. 0.5pack daily down from 1.5 pack/day just 1 month ago.  She is pleased with this, as am I. - Continue Chantix 1mg   BID with meals - Continue naltrexone 25mg  daily - Continue Wellbutrin 150mg  daily      J Dorothyann Gibbs, MD [facility] Health [facility]

## 2023-08-19 ENCOUNTER — Encounter: Payer: Self-pay | Admitting: Student

## 2023-08-19 ENCOUNTER — Ambulatory Visit: Payer: 59 | Admitting: Student

## 2023-08-19 DIAGNOSIS — F17200 Nicotine dependence, unspecified, uncomplicated: Secondary | ICD-10-CM | POA: Diagnosis not present

## 2023-08-19 NOTE — Assessment & Plan Note (Signed)
She has made tremendous progress here. 0.5pack daily down from 1.5 pack/day just 1 month ago.  She is pleased with this, as am I. - Continue Chantix 1mg  BID with meals - Continue naltrexone 25mg  daily - Continue Wellbutrin 150mg  daily

## 2023-08-19 NOTE — Patient Instructions (Signed)
[name], Kendra [name] to see you!  CONGRATS on cutting back on the cigarettes. This is huge. Maybe they'll be gone by next time I see you?   I will have our pharmacy team look into getting you your Ozempic. I also want you to download the Cronometer app to track what you're eating. Target >150g protein daily and >28g fiber daily.  I'll see you back in February or March,  Merry Christmas!  Kendra Gibbs MD

## 2023-08-19 NOTE — Assessment & Plan Note (Signed)
Obesity complicated by hyperlipidemia and osteoarthritis of the bilateral knees.  Unfortunately, we are unable to get her semaglutide after our last visit.  We will try this again with the assistance of our pharmacy team.  She has both Medicare and Medicaid so I expect we can get it covered by 1 of these 2 insurances.  Her grandson will help her download the app. - START semaglutide 0.25mg /week - Download Cronometer App - Target >150g protein daily - Target >28g fiber daily

## 2023-09-04 ENCOUNTER — Ambulatory Visit: Payer: 59 | Admitting: Podiatry

## 2023-09-10 DIAGNOSIS — Z9089 Acquired absence of other organs: Secondary | ICD-10-CM | POA: Diagnosis not present

## 2023-09-10 DIAGNOSIS — Z9889 Other specified postprocedural states: Secondary | ICD-10-CM | POA: Diagnosis not present

## 2023-09-11 DIAGNOSIS — Z9089 Acquired absence of other organs: Secondary | ICD-10-CM | POA: Diagnosis not present

## 2023-09-11 DIAGNOSIS — Z9889 Other specified postprocedural states: Secondary | ICD-10-CM | POA: Diagnosis not present

## 2023-09-19 ENCOUNTER — Telehealth: Payer: Self-pay

## 2023-09-19 NOTE — Telephone Encounter (Signed)
Received PA request for Ozempic.   Medication for dx of diabetes.

## 2023-09-19 NOTE — Telephone Encounter (Signed)
Patient LVM on nurse line in regards to [facility].   She reports this medication was never authorized by her insurance.   Attempted to call patient back as it appears we sent this in over 2 months ago. Unsure, if she has be able to start this at all.   Will await her return call.

## 2023-09-23 ENCOUNTER — Ambulatory Visit (INDEPENDENT_AMBULATORY_CARE_PROVIDER_SITE_OTHER): Payer: 59 | Admitting: Orthopaedic Surgery

## 2023-09-23 ENCOUNTER — Telehealth: Payer: Self-pay

## 2023-09-23 ENCOUNTER — Encounter: Payer: Self-pay | Admitting: Orthopaedic Surgery

## 2023-09-23 VITALS — Ht 64.0 in | Wt 275.0 lb

## 2023-09-23 DIAGNOSIS — G8929 Other chronic pain: Secondary | ICD-10-CM

## 2023-09-23 DIAGNOSIS — M25562 Pain in left knee: Secondary | ICD-10-CM | POA: Diagnosis not present

## 2023-09-23 DIAGNOSIS — M1712 Unilateral primary osteoarthritis, left knee: Secondary | ICD-10-CM | POA: Diagnosis not present

## 2023-09-23 NOTE — Progress Notes (Signed)
The patient is a pleasant 58 year old female that we have been seeing for some time now as a relates to severe arthritis of her left knee with bone-on-bone wear as well as moderate arthritis of the right knee.  She is in need of knee replacement surgery.  She has been on a significant weight loss journey.  Her BMI of 3 months ago at her last visit was 46.35.  She continues to experience severe and daily knee pain as it relates to her significant knee arthritis.  She has an Financial controller that will not allow [place] to proceed with any type of joint replacement surgery until her BMI is below 40.  Her BMI calculated today is 47 unfortunately.  She does not have a large soft tissue envelope around her left knee on exam.  She did have a fall on the ice last week hurting this knee and her ankle.  She does ambit with a cane.  She has unfortunately not been able to lose weight to help.  She understands that our hands are tied.  We can at least have her case manager look in to see if she is truly part of a bundle of system or not.  If she is not, I am absolutely comfortable with proceeding with surgery based on her x-rays, the soft tissue assessment around her knee and the fact that she is having falls.  We will call her if we are able to proceed with surgery.  If not we can see her back in 3 months for repeat weight and BMI calculation.  The patient meets the AMA guidelines for Morbid (severe) obesity with a BMI > 40.0 and I have recommended weight loss.

## 2023-09-23 NOTE — Telephone Encounter (Signed)
Rec'd PA request for Ozempic medication.  Ozempic is for dx of diabetes.

## 2023-10-08 ENCOUNTER — Other Ambulatory Visit: Payer: Self-pay | Admitting: Student

## 2023-10-08 DIAGNOSIS — M7989 Other specified soft tissue disorders: Secondary | ICD-10-CM

## 2023-10-11 ENCOUNTER — Encounter: Payer: Self-pay | Admitting: Podiatry

## 2023-10-11 ENCOUNTER — Ambulatory Visit (INDEPENDENT_AMBULATORY_CARE_PROVIDER_SITE_OTHER): Payer: 59 | Admitting: Podiatry

## 2023-10-11 VITALS — Ht 64.0 in | Wt 275.0 lb

## 2023-10-11 DIAGNOSIS — S90222A Contusion of left lesser toe(s) with damage to nail, initial encounter: Secondary | ICD-10-CM | POA: Diagnosis not present

## 2023-10-11 NOTE — Progress Notes (Signed)
Subjective:  Patient ID: Kendra [name], female    DOB: 07-08-1966,  MRN: [id number]  Chief Complaint  Patient presents with   Toe Injury    Pt is here due to left pinky toe pain states she stomp her toe a week ago and since then the toe has been swollen and hurts she believes she fracture or broke it.    58 y.o. female presents with the above complaint.  Patient presents with left pinky toe pain.  She states that she stump her foot is swollen and hurts she has some ecchymosis she wanted to make sure is not broken denies any other ischemia pain scale 5 out of 10 dull aching nature   Review of Systems: Negative except as noted in the HPI. Denies N/V/F/Ch.  Past Medical History:  Diagnosis Date   Anemia    Asthma    Cigarette smoker    Dysrhythmia    Tachycardia   Esophagitis    GERD (gastroesophageal reflux disease)    Hemangioma of liver    HTN (hypertension)    Hyperplastic colon polyp    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    Cor CTA 05/2019: Ca score 81.5 (96th percentile); mRCA < 25; dLM < 25; oLAD 25-49, pLAD < 25    Current Outpatient Medications:    acetaminophen (TYLENOL 8 HOUR) 650 MG CR tablet, Take 1 tablet (650 mg total) by mouth every 8 (eight) hours as needed for pain., Disp: 30 tablet, Rfl: 1   albuterol (VENTOLIN HFA) 108 (90 Base) MCG/ACT inhaler, INHALE 2 PUFFS BY MOUTH EVERY 4 HOURS AS NEEDED FOR WHEEZING OR SHORTNESS OF BREATH, Disp: 8.5 g, Rfl: 3   alum & mag hydroxide-simeth (MAALOX/MYLANTA) 200-200-20 MG/5ML suspension, Take 30 mLs by mouth every 6 (six) hours as needed for indigestion or heartburn. Reported on 10/26/2015, Disp: 355 mL, Rfl: 1   aspirin EC 81 MG tablet, Take 1 tablet (81 mg total) by mouth daily. Swallow whole., Disp: 90 tablet, Rfl: 3   buPROPion (WELLBUTRIN XL) 150 MG 24 hr tablet, Take 1 tablet (150 mg total) by mouth daily after breakfast., Disp: 90 tablet, Rfl: 3   Evolocumab (REPATHA SURECLICK) 140 MG/ML SOAJ, ADMINISTER 1 ML  UNDER THE SKIN EVERY 14 DAYS, Disp: 2 mL, Rfl: 11   furosemide (LASIX) 40 MG tablet, TAKE 1 TABLET(40 MG) BY MOUTH DAILY, Disp: 90 tablet, Rfl: 2   gabapentin (NEURONTIN) 300 MG capsule, TAKE 1 CAPSULE BY MOUTH ONCE DURING THE DAYTIME AND ANOTHER AT BEDTIME, Disp: 180 capsule, Rfl: 1   naltrexone (DEPADE) 50 MG tablet, Take 0.5 tablets (25 mg total) by mouth daily., Disp: 60 tablet, Rfl: 1   pantoprazole (PROTONIX) 40 MG tablet, TAKE 1 TABLET(40 MG) BY MOUTH TWICE DAILY BEFORE A MEAL, Disp: 120 tablet, Rfl: 1   rosuvastatin (CRESTOR) 20 MG tablet, TAKE 1 TABLET(20 MG) BY MOUTH DAILY, Disp: 90 tablet, Rfl: 1   Semaglutide,0.25 or 0.5MG /DOS, 2 MG/1.5ML SOPN, Inject 0.25 mg into the skin once a week. 0.25 mg once weekly for 2 weeks, then increase to 0.5mg  weekly, Disp: 3 mL, Rfl: 3   traMADol (ULTRAM) 50 MG tablet, Take 1 tablet (50 mg total) by mouth every 6 (six) hours as needed for moderate pain., Disp: 15 tablet, Rfl: 0   varenicline (CHANTIX) 1 MG tablet, Take 1 tablet (1 mg total) by mouth 2 (two) times daily with a meal., Disp: 180 tablet, Rfl: 1  Social History   Tobacco Use  Smoking Status  Every Day   Current packs/day: 3.00   Average packs/day: 3.0 packs/day for 23.1 years (69.4 ttl pk-yrs)   Types: Cigarettes   Start date: 08/27/2000  Smokeless Tobacco Never  Tobacco Comments   History of smoking 2-4 ppd from age 50 to age 57    Allergies  Allergen Reactions   Lisinopril Swelling   Objective:  There were no vitals filed for this visit. Body mass index is 47.2 kg/m. Constitutional Well developed. Well nourished.  Vascular Dorsalis pedis pulses palpable bilaterally. Posterior tibial pulses palpable bilaterally. Capillary refill normal to all digits.  No cyanosis or clubbing noted. Pedal hair growth normal.  Neurologic Normal speech. Oriented to person, place, and time. Epicritic sensation to light touch grossly present bilaterally.  Dermatologic Pain on palpation left  fifth digit mild ecchymosis noted some bruising noted.  Pain with range of motion of the fifth toe joint.  No other abnormalities identified  Orthopedic: Normal joint ROM without pain or crepitus bilaterally. No visible deformities. No bony tenderness.   Radiographs: None Assessment:  No diagnosis found. Plan:  Patient was evaluated and treated and all questions answered.  Left fifth digit nail contusion -All questions and concerns were discussed with the patient extensive detail given the amount of pain that she is having she will benefit from cam boot immobilization as well as what is buddy splinting.  Both of which were shown.  If any foot and ankle patient is having issues come back and call me.  No follow-ups on file.

## 2023-10-16 ENCOUNTER — Telehealth (INDEPENDENT_AMBULATORY_CARE_PROVIDER_SITE_OTHER): Payer: 59 | Admitting: Student

## 2023-10-16 DIAGNOSIS — Z91199 Patient's noncompliance with other medical treatment and regimen due to unspecified reason: Secondary | ICD-10-CM

## 2023-10-16 NOTE — Progress Notes (Signed)
In-person visit was rescheduled to virtual. Unable to connect via MyChart Video despite sending text message reminder. Tried to call patient, no answer. LVM. Will have patient re-schedule in person once inclement weather has passed. Eliezer Mccoy, MD

## 2023-10-29 ENCOUNTER — Encounter: Payer: Self-pay | Admitting: Student

## 2023-10-29 ENCOUNTER — Ambulatory Visit (INDEPENDENT_AMBULATORY_CARE_PROVIDER_SITE_OTHER): Payer: 59 | Admitting: Student

## 2023-10-29 DIAGNOSIS — Z9089 Acquired absence of other organs: Secondary | ICD-10-CM

## 2023-10-29 DIAGNOSIS — Z9889 Other specified postprocedural states: Secondary | ICD-10-CM

## 2023-10-29 DIAGNOSIS — E559 Vitamin D deficiency, unspecified: Secondary | ICD-10-CM | POA: Diagnosis not present

## 2023-10-29 MED ORDER — TIRZEPATIDE 2.5 MG/0.5ML ~~LOC~~ SOAJ
2.5000 mg | SUBCUTANEOUS | 2 refills | Status: DC
Start: 2023-10-29 — End: 2023-11-01

## 2023-10-29 NOTE — Patient Instructions (Addendum)
 I am going to place an order for [facility]. I will have our pharmacy team get to work right away on prior authorization. We will start at 2.5mg /week. And go up in about 4 weeks. Please make a 4 week follow-up appointment on your way out.  I am checking your parathyroid labs and vitamin D level today. I will call you and let you know if we need to get you back on the vitamin D.   J Dorothyann Gibbs, MD

## 2023-10-30 LAB — PTH, INTACT AND CALCIUM
Calcium: 9.6 mg/dL (ref 8.7–10.2)
PTH: 123 pg/mL — ABNORMAL HIGH (ref 15–65)

## 2023-10-30 LAB — VITAMIN D 25 HYDROXY (VIT D DEFICIENCY, FRACTURES): Vit D, 25-Hydroxy: 11.9 ng/mL — ABNORMAL LOW (ref 30.0–100.0)

## 2023-10-30 NOTE — Progress Notes (Signed)
    SUBJECTIVE:   CHIEF COMPLAINT / HPI:   Weight Loss Efforts She had previously lost over 45 pounds a high of 311 pounds in May 2023.  She was previously on semaglutide but her insurance stopped covering this in early 2024.  Since then her weight has begun to creep back up.  She expresses great frustration with this.  She has tried adjusting her eating patterns including avoiding simple carbohydrates and excess fats.  Nonetheless, she has continued to regain some of this weight.  She is up 12 pounds since December 2024.  She tells me that she called her insurance company and they seem to indicate on the phone that they would cover tirzepatide.  One of her primary motivations for losing weight is to get her BMI under 40 so that she can have her knees replaced with Dr. Magnus Ivan.  S/P Parathyroidectomy She had a parathyroidectomy with Dr. Gerrit Friends in May of last year. Does not appear she has had post-parathyroidectomy labs as of yet. Did have 15 weeks of Vit D supplementation per Dr. Gerrit Friends but has since completed this.    OBJECTIVE:   BP 138/64   Pulse 94   Ht 5\' 4"  (1.626 m)   Wt 280 lb (127 kg)   LMP 02/21/2015   SpO2 100%   BMI 48.06 kg/m   General: alert & oriented, no apparent distress, well groomed HEENT: normocephalic, atraumatic, EOM grossly intact, oral mucosa moist, neck supple Respiratory: normal respiratory effort GI: non-distended Skin: no rashes, no jaundice Psych: appropriate mood and affect   ASSESSMENT/PLAN:    Assessment & Plan Morbid obesity (HCC) Obesity complicated by hyperlipidemia and bilateral knee OA. Previously responded well to semaglutide until no longer covered by her insurance. Interested in getting back on a GLP-1a as she has been continuing to gain weight despite adhering to the dietary changes that brought her such success previously.  - START Tirzepatide 2.5mg  weekly  - F/u 4 weeks after starting tirzepatide Status post parathyroidectomy Doing  well since surgery. Completed 15 weeks of Vit D supplementation. Dr. Gerrit Friends has discharged her to PRN surgical follow-up.  - PTH, Ca, Vit D levels today     J Dorothyann Gibbs, MD [facility]

## 2023-10-30 NOTE — Assessment & Plan Note (Signed)
 Obesity complicated by hyperlipidemia and bilateral knee OA. Previously responded well to semaglutide until no longer covered by her insurance. Interested in getting back on a GLP-1a as she has been continuing to gain weight despite adhering to the dietary changes that brought her such success previously.  - START Tirzepatide 2.5mg  weekly  - F/u 4 weeks after starting tirzepatide

## 2023-10-31 ENCOUNTER — Telehealth: Payer: Self-pay

## 2023-10-31 NOTE — Telephone Encounter (Signed)
 Pharmacy Patient Advocate Encounter   Received notification from CoverMyMeds that prior authorization for [facility] is required/requested.   Insurance verification completed.   The patient is insured through [facility] .   PA required; PA submitted to above mentioned insurance via CoverMyMeds Key/confirmation #/[id number]. Status is pending

## 2023-10-31 NOTE — Telephone Encounter (Signed)
 Pharmacy Patient Advocate Encounter  Received notification from [facility] that Prior Authorization for [facility] has been DENIED.  Full denial letter will be uploaded to the media tab. See denial reason below.  Drugs when used for anorexia, weight loss, or weight gain are excluded from coverage under Medicare rules.   PA #/Case ID/Reference #: [id number]

## 2023-11-01 ENCOUNTER — Other Ambulatory Visit: Payer: Self-pay | Admitting: Student

## 2023-11-01 ENCOUNTER — Ambulatory Visit: Admitting: Pharmacist

## 2023-11-01 MED ORDER — WEGOVY 0.25 MG/0.5ML ~~LOC~~ SOAJ: 0.2500 mg | SUBCUTANEOUS | 1 refills | Status: DC

## 2023-11-04 ENCOUNTER — Telehealth: Payer: Self-pay

## 2023-11-04 MED ORDER — VITAMIN D (ERGOCALCIFEROL) 1.25 MG (50000 UNIT) PO CAPS
50000.0000 [IU] | ORAL_CAPSULE | ORAL | 0 refills | Status: DC
Start: 2023-11-04 — End: 2024-02-20

## 2023-11-04 NOTE — Addendum Note (Signed)
 Addended by: Darnelle Spangle B on: 11/04/2023 12:40 PM   Modules accepted: Orders

## 2023-11-04 NOTE — Telephone Encounter (Signed)
 Rec'd PA request for [facility].  Per previous PA denial for [employer]: Drugs when used for anorexia, weight loss, or weight gain are excluded from coverage under Medicare rules.

## 2023-11-07 ENCOUNTER — Other Ambulatory Visit (HOSPITAL_COMMUNITY): Payer: Self-pay

## 2023-11-07 ENCOUNTER — Encounter: Payer: Self-pay | Admitting: Pharmacist

## 2023-11-07 ENCOUNTER — Ambulatory Visit: Admitting: Pharmacist

## 2023-11-07 VITALS — BP 111/76 | Wt 279.8 lb

## 2023-11-07 DIAGNOSIS — F17200 Nicotine dependence, unspecified, uncomplicated: Secondary | ICD-10-CM

## 2023-11-07 MED ORDER — VARENICLINE TARTRATE 1 MG PO TABS
1.0000 mg | ORAL_TABLET | Freq: Two times a day (BID) | ORAL | 1 refills | Status: DC
Start: 2023-11-07 — End: 2024-04-06

## 2023-11-07 NOTE — Progress Notes (Signed)
   S:   Chief Complaint  Patient presents with   Medication Management    Tobacco Cessation   58 y.o. female who presents for evaluation/assistance with tobacco dependence.  PMH is significant for tobacco use, OA in knees, .   Patient was referred and last seen by Primary Care Provider, Dr. Marisue Humble, on 10/29/2023.  At last visit, patient was started on Wegovy (semaglutide) 0.25 mg weekly and ergocalciferol 1.25 mg weekly.   Age when started using tobacco on a daily basis 35. Number of cigarettes/day 2-4 ppd. Down to 1 ppd Estimated nicotine content per cigarette (mg) >1 mg (Newports).  Estimated nicotine intake per day 20 mg.   Denies waking to smoke. Medications used in past cessation efforts include: bupropion, varenicline. Rates confidence to get down to 4 cigarettes daily: 6 Importance to get down to 4 cigarettes daily: 7  Most common triggers to use tobacco include; pain (knee), helping to take care of her mother.  Stress is rated as very high.   Motivation to quit: better breathing (reports using albuterol twice daily to help with her breathing)  O: Clinical ASCVD: Yes   Review of Systems  Musculoskeletal:  Positive for joint pain.   Physical Exam Psychiatric:        Mood and Affect: Mood normal.        Thought Content: Thought content normal.        Judgment: Judgment normal.   Patient is participating in a Managed Medicaid Plan:  No  A/P: Tobacco use disorder with severe nicotine dependence of > 30 years duration in a patient who is fair candidate for success because of drastic reduction from 2-4 ppd to 1 ppd.  Patient goal to quit smoking within the next month.  Short-term goal is to decrease intake to < 10 with a goal of 4 or less by the end of March (2.5 weeks) -Increased varenicline to 1 mg twice daily with food. Patient counseled on purpose, proper use, and potential adverse effects, including GI upset. -Continued bupropion XL 150 mg daily   Written patient  instructions provided. Patient verbalized understanding of treatment plan.  Total time in face to face counseling 42 minutes.    Follow-up:  Pharmacist 11/25/2023 Patient seen with Mack Guise, PharmD Candidate.

## 2023-11-07 NOTE — Assessment & Plan Note (Signed)
 Tobacco use disorder with severe nicotine dependence of > 30 years duration in a patient who is fair candidate for success because of drastic reduction from 2-4 ppd to 1 ppd.  Patient goal to quit smoking within the next month.  Short-term goal is to decrease intake to < 10 with a goal of 4 or less by the end of March (2.5 weeks) -Increased varenicline to 1 mg twice daily with food. Patient counseled on purpose, proper use, and potential adverse effects, including GI upset. -Continued bupropion XL 150 mg daily

## 2023-11-07 NOTE — Telephone Encounter (Signed)
 Attempted PA for [facility] under patients medicaid.   Pharmacy Patient Advocate Encounter   Insurance verification completed.   The patient is insured through [facility] MEDICAID .   PA required; PA submitted to above mentioned insurance via Fax Key/confirmation #/EOC NONE. Status is pending

## 2023-11-07 NOTE — Patient Instructions (Addendum)
 Nice to see you today! Good progress on your goal of quitting   Medication Changes: START tylenol 650 mg twice daily  START ibuprofen once or twice daily with food as needed  Increase varenicline to 1 mg twice daily    Continue all other medication the same.    Tobacco Patient Instructions  Quitting smoking is one of the most important decisions you can make for your current and future health. Consider what you dislike about smoking and how quitting could personally benefit you. Try to cut down. Aim for reducing the amount you smoke by 16 cigarettes over the next 3 weeks.  My target quit date is: 01/07/2024  Starting today, Be a Quitter!  Remind yourself why you want to quit.  Delay your first cigarette of the day for as long as possible.  Start cleaning out all pockets, drawers, and your car of cigarettes.  Getting Through the Cravings Once You Are Smoke Free: Each craving will last about 10 minutes, whether or not you smoke. Here's how to get through the cravings without cigarettes:  DELAY: Tell yourself that you'll wait for the next craving. Do it every time! DEEP BREATHS: One reason smoking feels good is because you breathe in deeply to inhale. Take four slow, deep breaths and feel the relaxation without the hamful effects of cigarettes. DRINK WATER: Drink a glass of cool water. It will give your hands and mouth something to do and will help flush the nicotine out of your system faster. DIVERT: Do something else -- brush your teeth, take a walk, call a friend who can offer you support. Just moving onto something other than thinking about cigarettes will move you through the craving.   Frequently Asked Questions  What can I do when I get the urge to smoke? To get through the urge to smoke, try the following:  Review your reasons for quitting and think of all the benefits to your health, your finances, and your family.  Remind yourself that there is no such thing as just one  cigarette -- or even one puff.  Ride out the desire to smoke. Use the 4 Os -- Delay, Deep Breaths, Drink Water and Divert to get you through. The craving will go away eventually. Do not fool yourself into thinking you can have just one cigarette.  Any tips on how to deal with stress? Stress is a natural part of life. The key is to deal with it without reaching for a cigarette. Taking deep breaths, counting backwards from 10 and asking yourself 1-how big a deal is this?"  Writing down your feelings, talking with a friend and doing things like positive self-talk and meditation are some other ways that people deal with daily stress.  What if I start smoking again? Slips happen. Most people try to quit smoking a few times before they are successful. Don't beat yourself up if this happens to you! Ask yourself if this was a slip or a relapse. A slip is a one-time mistake that is quickly corrected. A relapse is going back to your old smoking habits.   If you slip, don't give up. Think of it as a learning experience. Ask yourself what went wrong and renew your commitment to staying away from smoking for good.  If you relapse, try not to get discouraged. Ask yourself the question "What caused me to start smoking?" Figure out what helped you and what didn't when you tried to quit. Knowing why you relapsed is useful  information for your next attempt to quit.

## 2023-11-08 NOTE — Progress Notes (Signed)
 Reviewed and agree with Dr Macky Lower plan.

## 2023-11-11 ENCOUNTER — Other Ambulatory Visit (HOSPITAL_COMMUNITY): Payer: Self-pay

## 2023-11-14 ENCOUNTER — Other Ambulatory Visit (HOSPITAL_COMMUNITY): Payer: Self-pay

## 2023-11-18 ENCOUNTER — Other Ambulatory Visit (HOSPITAL_COMMUNITY): Payer: Self-pay

## 2023-11-21 ENCOUNTER — Other Ambulatory Visit (HOSPITAL_COMMUNITY): Payer: Self-pay

## 2023-11-21 NOTE — Telephone Encounter (Signed)
 Refaxed PA request and chart notes 11/21/23.

## 2023-11-25 ENCOUNTER — Other Ambulatory Visit (HOSPITAL_COMMUNITY): Payer: Self-pay

## 2023-11-25 ENCOUNTER — Telehealth: Payer: Self-pay | Admitting: Pharmacist

## 2023-11-25 ENCOUNTER — Ambulatory Visit: Admitting: Pharmacist

## 2023-11-25 NOTE — Telephone Encounter (Signed)
 Patient contacted for follow/up of tobacco intake reduction / cessation attempt.   Since last appointment (3/13) patient reports smoking the same amount - 1 pack per day. Patient endorses continuous knee pain. Scheduled follow-up appointment on 4/9.  Medications currently being used; Varenicline - 1 mg BID, Bupropion - 150 XL daily   Patient denies any significant side effects from tobacco cessation therapy.   Total time with patient call and documentation of interaction: 4 minutes.

## 2023-11-26 ENCOUNTER — Other Ambulatory Visit: Payer: Self-pay | Admitting: Podiatry

## 2023-11-26 ENCOUNTER — Other Ambulatory Visit: Payer: Self-pay | Admitting: Student

## 2023-11-26 NOTE — Telephone Encounter (Signed)
 Reviewed and agree with Dr Macky Lower plan.

## 2023-11-27 ENCOUNTER — Telehealth: Payer: Self-pay

## 2023-11-27 NOTE — Telephone Encounter (Signed)
 Patient calls nurse line requesting a prescription for Ibuprofen.  She reports Dr. Raymondo Band told her to start taking this last month. She reports she has been on 800mg  in the past. She is requesting a prescription for this.   Advised she can purchase this over OTC, however will forward to PCP.

## 2023-11-28 NOTE — Telephone Encounter (Signed)
 Called patient and informed of provider message.   She has an appointment with Dr. Barbaraann Faster on 12/04/23, she will discuss this further at this appointment.   Veronda Prude, RN

## 2023-11-29 ENCOUNTER — Other Ambulatory Visit (HOSPITAL_COMMUNITY): Payer: Self-pay

## 2023-12-04 ENCOUNTER — Telehealth: Payer: Self-pay

## 2023-12-04 ENCOUNTER — Encounter: Payer: Self-pay | Admitting: Pharmacist

## 2023-12-04 ENCOUNTER — Other Ambulatory Visit (HOSPITAL_COMMUNITY): Payer: Self-pay

## 2023-12-04 ENCOUNTER — Ambulatory Visit (INDEPENDENT_AMBULATORY_CARE_PROVIDER_SITE_OTHER): Payer: Self-pay | Admitting: Student

## 2023-12-04 ENCOUNTER — Ambulatory Visit: Admitting: Pharmacist

## 2023-12-04 ENCOUNTER — Encounter: Payer: Self-pay | Admitting: Student

## 2023-12-04 VITALS — BP 127/70 | HR 69 | Ht 64.0 in | Wt 283.0 lb

## 2023-12-04 VITALS — BP 127/70 | HR 69 | Wt 283.0 lb

## 2023-12-04 DIAGNOSIS — R600 Localized edema: Secondary | ICD-10-CM

## 2023-12-04 DIAGNOSIS — R0609 Other forms of dyspnea: Secondary | ICD-10-CM | POA: Diagnosis not present

## 2023-12-04 DIAGNOSIS — F17200 Nicotine dependence, unspecified, uncomplicated: Secondary | ICD-10-CM

## 2023-12-04 DIAGNOSIS — M79604 Pain in right leg: Secondary | ICD-10-CM

## 2023-12-04 DIAGNOSIS — M79605 Pain in left leg: Secondary | ICD-10-CM

## 2023-12-04 DIAGNOSIS — R42 Dizziness and giddiness: Secondary | ICD-10-CM | POA: Diagnosis not present

## 2023-12-04 MED ORDER — IBUPROFEN 800 MG PO TABS: 800.0000 mg | ORAL_TABLET | Freq: Four times a day (QID) | ORAL | 1 refills | Status: DC | PRN

## 2023-12-04 MED ORDER — FUROSEMIDE 40 MG PO TABS: 40.0000 mg | ORAL_TABLET | Freq: Every day | ORAL | 2 refills | Status: DC

## 2023-12-04 MED ORDER — BUDESONIDE-FORMOTEROL FUMARATE 80-4.5 MCG/ACT IN AERO: 2.0000 | INHALATION_SPRAY | Freq: Two times a day (BID) | RESPIRATORY_TRACT | 3 refills | Status: AC

## 2023-12-04 MED ORDER — ALBUTEROL SULFATE HFA 108 (90 BASE) MCG/ACT IN AERS
INHALATION_SPRAY | RESPIRATORY_TRACT | 3 refills | Status: AC
Start: 2023-12-04 — End: ?

## 2023-12-04 NOTE — Telephone Encounter (Signed)
 Reached out to [facility] regarding PA for wegovy.   PA DENIED; patient has primary coverage.   (CONFIRMATION/ PA NUMBER [id number])  Per medicaid rep: medicaid will not cover any medication denied by MEDICARE PART D, only Part B.  Will attempt PA under her medicare, although most Part D plans do not cover injections for weight loss.

## 2023-12-04 NOTE — Assessment & Plan Note (Addendum)
 Patient appreciates that she has shortness of breath daily, needs her albuterol inhaler twice a day.  Patient also appreciates wheezing and chest tightness.  Patient has no history of asthma, has had PFTs that show concern for COPD.  Will send patient back for PFTs.  Suspect patient may need controller medication for possible COPD. - Follow-up with Dr. Hildred Laser for PFTs - Refill albuterol - Will start Symbicort day, and discontinue with PFTs normal

## 2023-12-04 NOTE — Patient Instructions (Signed)
 It was great to see you! Thank you for allowing me to participate in your care!  I recommend that you always bring your medications to each appointment as this makes it easy to ensure we are on the correct medications and helps [place] not miss when refills are needed.  Our plans for today:  - Leg pain  Ibuprofen 800 mg as needed  Referral to Physical therapy  - Dizziness I'm unsure what is causing your dizziness, but make a follow up appointment if this continues to be an issue. Your blood pressure was not to low, or of any concern for dizziness. Will check some lab work.  CBC  CMP   -Shortness of breath I'm not sure what's causing your shortness of breath, so I want you to get Pulmonary Function Testing (PFT) with Dr. Raymondo Band (I want to see if you have asthma or COPD) Take albuterol as needed Take Symbicort daily (stop if PFT shows no signs of asthma)  We are checking some labs today, I will call you if they are abnormal will send you a MyChart message or a letter if they are normal.  If you do not hear about your labs in the next 2 weeks please let us know.  Take care and seek immediate care sooner if you develop any concerns.   Dr. Bess Kinds, MD [facility]

## 2023-12-04 NOTE — Progress Notes (Signed)
 SUBJECTIVE:   CHIEF COMPLAINT / HPI:   Ibprofen  Back of leg pain Has been an issue for 3 years, sometimes has tight feeling in hamstrings and pain/discomfort when trying to get up and walk. Had tried PT once, but stopped because she didn't want to use all her time up before she get's knee surgery.   Asthma Using albuterol inhaler BID for SOB, still smoking. Also notes frequent wheezing and some chest tightness at times. No night time symptoms.   Dizziness Been an issue for last couple of weeks. Happens when walking or changing position. Has presyncope, but has not synopsized.   PERTINENT  PMH / PSH:   OBJECTIVE:  BP 127/70   Pulse 69   Ht 5\' 4"  (1.626 m)   Wt 283 lb (128.4 kg)   LMP 02/21/2015   SpO2 100%   BMI 48.58 kg/m  Physical Exam Constitutional:      General: She is not in acute distress.    Appearance: Normal appearance. She is not ill-appearing.  Cardiovascular:     Rate and Rhythm: Normal rate and regular rhythm.     Pulses: Normal pulses.     Heart sounds: Normal heart sounds. No murmur heard.    No friction rub. No gallop.  Pulmonary:     Effort: Pulmonary effort is normal. No respiratory distress.     Breath sounds: Normal breath sounds. No stridor. No wheezing, rhonchi or rales.  Musculoskeletal:     Right upper leg: Tenderness present. No swelling, edema, deformity, lacerations or bony tenderness.     Left upper leg: Tenderness present. No swelling, edema, deformity, lacerations or bony tenderness.  Neurological:     Mental Status: She is alert.     Cranial Nerves: Cranial nerves 2-12 are intact. No cranial nerve deficit, dysarthria or facial asymmetry.     Sensory: Sensation is intact. No sensory deficit.     Motor: Motor function is intact. No weakness or tremor.      ASSESSMENT/PLAN:   Assessment & Plan DOE (dyspnea on exertion) Patient appreciates that she has shortness of breath daily, needs her albuterol inhaler twice a day.  Patient also  appreciates wheezing and chest tightness.  Patient has no history of asthma, has had PFTs that show concern for COPD.  Will send patient back for PFTs.  Suspect patient may need controller medication for possible COPD. - Follow-up with Dr. Hildred Laser for PFTs - Refill albuterol - Will start Symbicort day, and discontinue with PFTs normal Bilateral leg pain Patient comes in for follow-up of her bilateral leg pain.  Patient appreciates this has been an issue for the last 2 to 3 years.  Patient appreciates legs feel tight in the hamstrings, and has difficult time getting up from seated position.  Patient's MSK exam benign except tender to palpation in hamstrings, full range of motion.  Suspect patient having musculoskeletal issue causing tightness in hamstrings.  Will recommend physical therapy.  Patient also notes that she has arthritis in both knees, that is flared up, and takes ibuprofen for it.  Patient continued to lose weight, and then have knees replaced.  Will continue ibuprofen as needed. - Ibuprofen 800 mg as needed daily - PT Dizziness Patient comes in with complaint of dizziness this been an issue for last 2 to 3 weeks, she appreciates that she has dizzy spells sporadically when walking, or changing position.  She has experienced presyncope but has not syncopized.  Patient on Lasix, but had negative orthostatics.  Neuroexam benign.  Will check CBC, CMP for other causes of dizziness.  Unsure what could be causing dizziness but low concern for vertigo at this time, and no notable neurologic deficit. - CBC - CMP No follow-ups on file. Bess Kinds, MD 12/04/2023, 10:54 AM PGY-3, [facility]

## 2023-12-04 NOTE — Assessment & Plan Note (Signed)
 Tobacco use disorder with severe nicotine dependence of > 30 years duration in a patient who remains a fair candidate for success because of continued success with intake reduction to 1 ppd and motivation to quit completely in the future. Short-term goal is to decrease intake to 15 cigarettes or less by the next visit, end of April (3 weeks). -Continued varenicline to 1 mg twice daily with food. Patient counseled on purpose, proper use, and potential adverse effects, including GI upset. -Continued bupropion XL 150 mg daily Encouraged patient to identify a reward for quitting as motivation to make progress.

## 2023-12-04 NOTE — Progress Notes (Signed)
   S:   Chief Complaint  Patient presents with   Medication Management    Tobacco Intake   58 y.o. female who presents for evaluation/assistance with tobacco dependence.  PMH is significant for tobacco use, OA in knees and recent back pain.    Patient was referred and last seen by Primary Care Provider, Dr. Marisue Humble, on 10/29/2023.    Age when started using tobacco on a daily basis 35. Number of cigarettes/day continues at 1 ppd Estimated nicotine content per cigarette (mg) >1 mg (Newports).  Estimated nicotine intake per day 20 mg.   Denies waking to smoke. Medications used in past cessation efforts include: bupropion, varenicline.   Most common triggers to use tobacco include; pain (knee) and back. Stress remains very high.    Motivation to quit: better breathing (reports she has run out of albuterol and requests refill)   O: Clinical ASCVD:  The ASCVD Risk score (Arnett DK, et al., 2019) failed to calculate for the following reasons:   The valid total cholesterol range is 130 to 320 mg/dL  Review of Systems  Musculoskeletal:  Positive for back pain and joint pain (knees).  All other systems reviewed and are negative.   Physical Exam Vitals reviewed.  Pulmonary:     Effort: Pulmonary effort is normal.  Neurological:     Mental Status: She is alert.  Psychiatric:        Mood and Affect: Mood normal.        Behavior: Behavior normal.        Thought Content: Thought content normal.        Judgment: Judgment normal.   Patient is participating in a Managed Medicaid Plan:  Yes   A/P: Tobacco use disorder with severe nicotine dependence of > 30 years duration in a patient who remains a fair candidate for success because of continued success with intake reduction to 1 ppd and motivation to quit completely in the future. Short-term goal is to decrease intake to 15 cigarettes or less by the next visit, end of April (3 weeks). -Continued varenicline to 1 mg twice daily with  food. Patient counseled on purpose, proper use, and potential adverse effects, including GI upset. -Continued bupropion XL 150 mg daily Encouraged patient to identify a reward for quitting as motivation to make progress.   [facility] coverage discussed.  Follow-up with [facility] about access.   Written patient instructions provided. Patient verbalized understanding of treatment plan.  Total time in face to face counseling 26 minutes.    Follow-up:  Pharmacist 3 weeks (12/25/2023) PCP clinic visit today with Dr. Barbaraann Faster

## 2023-12-04 NOTE — Assessment & Plan Note (Addendum)
 Patient comes in for follow-up of her bilateral leg pain.  Patient appreciates this has been an issue for the last 2 to 3 years.  Patient appreciates legs feel tight in the hamstrings, and has difficult time getting up from seated position.  Patient's MSK exam benign except tender to palpation in hamstrings, full range of motion.  Suspect patient having musculoskeletal issue causing tightness in hamstrings.  Will recommend physical therapy.  Patient also notes that she has arthritis in both knees, that is flared up, and takes ibuprofen for it.  Patient continued to lose weight, and then have knees replaced.  Will continue ibuprofen as needed. - Ibuprofen 800 mg as needed daily - PT

## 2023-12-04 NOTE — Assessment & Plan Note (Addendum)
 Patient comes in with complaint of dizziness this been an issue for last 2 to 3 weeks, she appreciates that she has dizzy spells sporadically when walking, or changing position.  She has experienced presyncope but has not syncopized.  Patient on Lasix, but had negative orthostatics.  Neuroexam benign.  Will check CBC, CMP for other causes of dizziness.  Unsure what could be causing dizziness but low concern for vertigo at this time, and no notable neurologic deficit. - CBC - CMP

## 2023-12-04 NOTE — Progress Notes (Signed)
 Reviewed and agree with Dr Macky Lower plan.

## 2023-12-04 NOTE — Telephone Encounter (Signed)
 Pharmacy Patient Advocate Encounter   Received notification from  PREVIOUS INSURANCE DENIAL  that prior authorization for [facility] is required/requested.   Insurance verification completed.   The patient is insured through [facility] .   PA required; PA submitted to above mentioned insurance via CoverMyMeds Key/confirmation #/EOC [employer]. Status is pending

## 2023-12-04 NOTE — Patient Instructions (Signed)
 Nice to see you today!   Medication Changes: Continue varenicline (Chantix) at the same dose 1mg  twice daily with food.    Tobacco Patient Instructions  Quitting smoking is one of the most important decisions you can make for your current and future health. Consider what you dislike about smoking and how quitting could personally benefit you. Try to cut down. Aim for reducing the amount you smoke from 1 ppd to 3/4 ppd over the next 3 weeks by focusing on NOT smoking back-to-back cigarettes.  Try to think of a prize for yourself when you quit smoking.   Starting today, Be a Quitter!  Remind yourself why you want to quit.  Delay your first cigarette of the day for as long as possible.  Start cleaning out all pockets, drawers, and your car of cigarettes.

## 2023-12-05 ENCOUNTER — Encounter: Payer: Self-pay | Admitting: Student

## 2023-12-05 LAB — COMPREHENSIVE METABOLIC PANEL WITH GFR
ALT: 12 IU/L (ref 0–32)
AST: 13 IU/L (ref 0–40)
Albumin: 4.1 g/dL (ref 3.8–4.9)
Alkaline Phosphatase: 86 IU/L (ref 44–121)
BUN/Creatinine Ratio: 16 (ref 9–23)
BUN: 12 mg/dL (ref 6–24)
Bilirubin Total: 0.6 mg/dL (ref 0.0–1.2)
CO2: 25 mmol/L (ref 20–29)
Calcium: 10.3 mg/dL — ABNORMAL HIGH (ref 8.7–10.2)
Chloride: 104 mmol/L (ref 96–106)
Creatinine, Ser: 0.77 mg/dL (ref 0.57–1.00)
Globulin, Total: 2.4 g/dL (ref 1.5–4.5)
Glucose: 95 mg/dL (ref 70–99)
Potassium: 5.2 mmol/L (ref 3.5–5.2)
Sodium: 141 mmol/L (ref 134–144)
Total Protein: 6.5 g/dL (ref 6.0–8.5)
eGFR: 90 mL/min/{1.73_m2} (ref >59)

## 2023-12-05 LAB — CBC
Hematocrit: 42.6 % (ref 34.0–46.6)
Hemoglobin: 14.3 g/dL (ref 11.1–15.9)
MCH: 33.5 pg — ABNORMAL HIGH (ref 26.6–33.0)
MCHC: 33.6 g/dL (ref 31.5–35.7)
MCV: 100 fL — ABNORMAL HIGH (ref 79–97)
Platelets: 262 10*3/uL (ref 150–450)
RBC: 4.27 x10E6/uL (ref 3.77–5.28)
RDW: 12.3 % (ref 11.7–15.4)
WBC: 6.8 10*3/uL (ref 3.4–10.8)

## 2023-12-06 NOTE — Telephone Encounter (Signed)
 Pharmacy Patient Advocate Encounter  Received notification from [facility] that Prior Authorization for [facility] has been DENIED.  Full denial letter will be uploaded to the media tab. See denial reason below.  Your requested medication is an antiobesity medication that is excluded from Part D prescription coverage under Medicare rules, unless it is being used for a medicallyaccepted diagnosis approved by the Food and Drug Administration.  Reginal Lutes may be covered when used for a medically-accepted indication. The Food and Drug Administration has approved [phone] for the treatment to reduce the risk of major adverse cardiovascular events (cardiovascular death, nonfatal myocardial infarction, or nonfatal stroke). Any request outside this indication is an exclusion.  PA #/Case ID/Reference #: [id number]

## 2023-12-23 ENCOUNTER — Ambulatory Visit: Payer: 59 | Admitting: Orthopaedic Surgery

## 2023-12-24 ENCOUNTER — Ambulatory Visit (INDEPENDENT_AMBULATORY_CARE_PROVIDER_SITE_OTHER): Payer: 59 | Admitting: Podiatry

## 2023-12-24 ENCOUNTER — Encounter: Payer: Self-pay | Admitting: Podiatry

## 2023-12-24 VITALS — Ht 64.0 in | Wt 283.0 lb

## 2023-12-24 DIAGNOSIS — B351 Tinea unguium: Secondary | ICD-10-CM

## 2023-12-24 DIAGNOSIS — M79674 Pain in right toe(s): Secondary | ICD-10-CM

## 2023-12-24 DIAGNOSIS — M79675 Pain in left toe(s): Secondary | ICD-10-CM | POA: Diagnosis not present

## 2023-12-25 ENCOUNTER — Ambulatory Visit: Admitting: Pharmacist

## 2023-12-29 NOTE — Progress Notes (Signed)
  Subjective:  Patient ID: Kendra [name], female    DOB: 02-14-1966,  MRN: [id number]  Kendra [name] presents to clinic today for painful elongated mycotic toenails 1-5 bilaterally which are tender when wearing enclosed shoe gear. Pain is relieved with periodic professional debridement.  Chief Complaint  Patient presents with   Nail Problem    Pain due to onychomycosis of toenails of both feet RFC    New problem(s): None.   PCP is Limmie Ren, MD.  Allergies  Allergen Reactions   Lisinopril  Swelling    Review of Systems: Negative except as noted in the HPI.  Objective: No changes noted in today's physical examination. There were no vitals filed for this visit. Kendra [name] is a pleasant 58 y.o. female morbidly obese in NAD. AAO x 3.  Vascular Examination: Capillary refill time immediate b/l. Vascular status intact b/l with palpable pedal pulses. Pedal hair present b/l. No pain with calf compression b/l. Skin temperature gradient WNL b/l. No cyanosis or clubbing b/l. No ischemia or gangrene noted b/l. Trace edema noted BLE.  Neurological Examination: Sensation grossly intact b/l with 10 gram monofilament. Vibratory sensation intact b/l.   Dermatological Examination: Pedal skin with normal turgor, texture and tone b/l.  No open wounds. No interdigital macerations.   Toenails 1-5 b/l thick, discolored, elongated with subungual debris and pain on dorsal palpation.   Hyperkeratotic lesion(s) submet head 5 right foot.  No erythema, no edema, no drainage, no fluctuance. Porokeratotic lesion(s) submet head 1 right foot. No erythema, no edema, no drainage, no fluctuance.  Musculoskeletal Examination: Muscle strength 5/5 to all lower extremity muscle groups bilaterally. Plantarflexed metatarsal(s) 1st metatarsal head right foot.  Radiographs: None  Assessment/Plan: 1. Pain due to onychomycosis of toenails of both feet     -Patient was evaluated today. All  questions/concerns addressed on today's visit. -Will submit preauthorization to insurance company for paring of callus/porokeratosis of right foot. -Patient to continue soft, supportive shoe gear daily. -Toenails 1-5 b/l were debrided in length and girth with sterile nail nippers and dremel without iatrogenic bleeding.  -As a courtesy, callus(es) submet head 5 right foot pared utilizing sterile scalpel blade without complication or incident. Total number pared=1. -As a courtesy, porokeratotic lesion(s) submet head 1 right foot pared and enucleated without complication or incident. Total number pared=1. -Patient/POA to call should there be question/concern in the interim.   Return in about 3 months (around 03/24/2024).  Kendra [name], DPM       LOCATION: [address] [place] [address]                   Office [phone]   [facility] LOCATION: [address] [name], [place] [address] Office [phone]

## 2024-01-10 ENCOUNTER — Ambulatory Visit: Admitting: Physical Therapy

## 2024-01-11 ENCOUNTER — Other Ambulatory Visit: Payer: Self-pay | Admitting: Student

## 2024-01-11 DIAGNOSIS — F17200 Nicotine dependence, unspecified, uncomplicated: Secondary | ICD-10-CM

## 2024-01-11 DIAGNOSIS — G8929 Other chronic pain: Secondary | ICD-10-CM

## 2024-01-14 ENCOUNTER — Encounter: Payer: Self-pay | Admitting: Student

## 2024-01-14 ENCOUNTER — Ambulatory Visit (INDEPENDENT_AMBULATORY_CARE_PROVIDER_SITE_OTHER): Admitting: Student

## 2024-01-14 DIAGNOSIS — E210 Primary hyperparathyroidism: Secondary | ICD-10-CM | POA: Diagnosis not present

## 2024-01-14 DIAGNOSIS — M170 Bilateral primary osteoarthritis of knee: Secondary | ICD-10-CM | POA: Diagnosis not present

## 2024-01-14 DIAGNOSIS — E211 Secondary hyperparathyroidism, not elsewhere classified: Secondary | ICD-10-CM | POA: Diagnosis not present

## 2024-01-14 NOTE — Assessment & Plan Note (Signed)
-   Repeat PTH, Ca, Vit D levels today

## 2024-01-14 NOTE — Assessment & Plan Note (Signed)
 Pain control with ibuprofen /tylenol . Often ambulates with a cane as she feels this helps her to increase her stamina/activity levels. Is scheduled to begin PT tomorrow.  - PT may be quite helpful here - Already plugged in with ortho, but not a surgical candidate unless we can get her to BMI <40  - Continue tylenol  and ibuprofen  for pain - Walk with cane to increase overall activity

## 2024-01-14 NOTE — Assessment & Plan Note (Signed)
 Obesity with BMI 49. Unfortunately cannot get coverage for semaglutide  with her current insurance despite great success with this drug in the past. Previously dismissed from [facility] for no-shows. In a tough place. She is highly motivated to lose weight to allow her to pursue surgical interventions for her knees. Willing to cut carbs again.  - Cutting carbs is a reasonable next step - Already on buproprion and naltrexone   - Activity as tolerated

## 2024-01-14 NOTE — Patient Instructions (Addendum)
 [name], I'm so sorry that we can't get insurance to cover the [facility]. I want to  be sure that we are keeping you mobile and active. Physical therapy is important here.  I will keep my ear to the ground and let you know as soon as I hear about an opening for getting you a GLP1 drug. These things are always in flux and these are only going to become easier to access.  Let's repeat your PTH labs, and I will call you results in a few days.    Alexa Andrews, MD

## 2024-01-14 NOTE — Progress Notes (Signed)
    SUBJECTIVE:   CHIEF COMPLAINT / HPI:   Weight Loss Efforts  Bilateral Knee OA Has severe OA of the bilateral knees that impacts her mobility. She often uses a cane to help with mobility, though is not using it here today. Here to follow-up on her weight loss efforts as she is not considered a candidate for knee surgery until we can get her BMI <40.  Was previously on semaglutide  and successfully lost over 45 points from a high of 311 pounds in May 2023. Her insurance has since stopped covering this and she has unfortunately continued to gain back the weight despite trying to eat a lower carb diet and exercise as much as her knees will tolerate.  Discussed care with our pharmacy team. Unfortunately it does not sound like she will be a candidate for weight loss medications at all with her current coverage.  Hyperparathyroidism S/p parathyroidectomy about a year ago. Labs in March of this year were sugestive of secondary hyperparathyroidism 2/2 Vit D deficiency. She has been on Vit D supplementation for about 11 weeks now.    OBJECTIVE:   BP (!) 128/90   Pulse 99   Ht 5\' 4"  (1.626 m)   Wt 288 lb (130.6 kg)   LMP 02/21/2015   SpO2 99%   BMI 49.44 kg/m   Gen: Well appearing and NAD, antalgic gait. Walking without assistance of cane in clinic today HEENT: normocephalic, atraumatic, EOM grossly intact, oral mucosa moist, neck supple Respiratory: normal respiratory effort GI: non-distended Skin: no rashes, no jaundice Psych: appropriate mood and affect   ASSESSMENT/PLAN:   Assessment & Plan Morbid obesity (HCC) Obesity with BMI 49. Unfortunately cannot get coverage for semaglutide  with her current insurance despite great success with this drug in the past. Previously dismissed from [facility] for no-shows. In a tough place. She is highly motivated to lose weight to allow her to pursue surgical interventions for her knees. Willing to cut carbs again.  - Cutting carbs is a reasonable next  step - Already on buproprion and naltrexone   - Activity as tolerated  Primary osteoarthritis of both knees Pain control with ibuprofen /tylenol . Often ambulates with a cane as she feels this helps her to increase her stamina/activity levels. Is scheduled to begin PT tomorrow.  - PT may be quite helpful here - Already plugged in with ortho, but not a surgical candidate unless we can get her to BMI <40  - Continue tylenol  and ibuprofen  for pain - Walk with cane to increase overall activity  Secondary hyperparathyroidism, non-renal (HCC) - Repeat PTH, Ca, Vit D levels today      J Lark Plum, MD [facility]

## 2024-01-15 ENCOUNTER — Ambulatory Visit: Admitting: Physical Therapy

## 2024-01-15 ENCOUNTER — Other Ambulatory Visit: Payer: Self-pay | Admitting: Student

## 2024-01-15 DIAGNOSIS — I2510 Atherosclerotic heart disease of native coronary artery without angina pectoris: Secondary | ICD-10-CM

## 2024-01-15 DIAGNOSIS — E782 Mixed hyperlipidemia: Secondary | ICD-10-CM

## 2024-01-16 LAB — PTH, INTACT AND CALCIUM
Calcium: 10.5 mg/dL — ABNORMAL HIGH (ref 8.7–10.2)
PTH: 98 pg/mL — ABNORMAL HIGH (ref 15–65)

## 2024-01-16 LAB — VITAMIN D 25 HYDROXY (VIT D DEFICIENCY, FRACTURES): Vit D, 25-Hydroxy: 13.8 ng/mL — ABNORMAL LOW (ref 30.0–100.0)

## 2024-01-24 ENCOUNTER — Ambulatory Visit: Attending: Family Medicine

## 2024-01-24 ENCOUNTER — Other Ambulatory Visit: Payer: Self-pay

## 2024-01-24 DIAGNOSIS — M79604 Pain in right leg: Secondary | ICD-10-CM | POA: Insufficient documentation

## 2024-01-24 DIAGNOSIS — M6281 Muscle weakness (generalized): Secondary | ICD-10-CM | POA: Diagnosis not present

## 2024-01-24 DIAGNOSIS — M79605 Pain in left leg: Secondary | ICD-10-CM | POA: Insufficient documentation
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
## 2024-01-27 ENCOUNTER — Ambulatory Visit: Payer: Self-pay | Admitting: Student

## 2024-01-27 DIAGNOSIS — E210 Primary hyperparathyroidism: Secondary | ICD-10-CM

## 2024-02-03 NOTE — Telephone Encounter (Signed)
 Patient returns call to nurse line. Assisted patient in scheduling thyroid  US  and Nuclear medicine study on 6/20.   Rescheduled patient's follow up with PCP on 02/20/24.  Elsie Halo, RN

## 2024-02-12 ENCOUNTER — Ambulatory Visit: Admitting: Student

## 2024-02-13 ENCOUNTER — Encounter

## 2024-02-14 ENCOUNTER — Encounter (HOSPITAL_COMMUNITY)
Admission: RE | Admit: 2024-02-14 | Discharge: 2024-02-14 | Disposition: A | Discharge status: Home / Self Care | Source: Ambulatory Visit | Attending: Family Medicine | Admitting: Family Medicine

## 2024-02-14 ENCOUNTER — Ambulatory Visit (HOSPITAL_COMMUNITY)
Admission: RE | Admit: 2024-02-14 | Discharge: 2024-02-14 | Disposition: A | Discharge status: Home / Self Care | Source: Ambulatory Visit | Attending: Family Medicine | Admitting: Family Medicine

## 2024-02-14 ENCOUNTER — Encounter (HOSPITAL_COMMUNITY)
Admission: RE | Admit: 2024-02-14 | Discharge: 2024-02-14 | Disposition: A | Discharge status: Home / Self Care | Source: Ambulatory Visit | Attending: Family Medicine

## 2024-02-14 DIAGNOSIS — E210 Primary hyperparathyroidism: Secondary | ICD-10-CM | POA: Insufficient documentation

## 2024-02-14 DIAGNOSIS — R221 Localized swelling, mass and lump, neck: Secondary | ICD-10-CM | POA: Diagnosis not present

## 2024-02-14 DIAGNOSIS — E042 Nontoxic multinodular goiter: Secondary | ICD-10-CM | POA: Diagnosis not present

## 2024-02-14 MED ORDER — TECHNETIUM TC 99M SESTAMIBI GENERIC - CARDIOLITE
25.0000 | Freq: Once | INTRAVENOUS | Status: AC | PRN
  Administered 2024-02-14: 25 via INTRAVENOUS

## 2024-02-15 ENCOUNTER — Ambulatory Visit: Attending: Family Medicine

## 2024-02-15 ENCOUNTER — Telehealth: Payer: Self-pay

## 2024-02-15 DIAGNOSIS — M79605 Pain in left leg: Secondary | ICD-10-CM | POA: Insufficient documentation

## 2024-02-15 DIAGNOSIS — M79604 Pain in right leg: Secondary | ICD-10-CM | POA: Insufficient documentation

## 2024-02-15 NOTE — Telephone Encounter (Signed)
 LVM regarding missed appt. Reminder of attendance policy and next schedule PT session. - MJ
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
## 2024-02-18 ENCOUNTER — Telehealth: Payer: Self-pay | Admitting: Physical Therapy

## 2024-02-18 ENCOUNTER — Ambulatory Visit: Admitting: Physical Therapy

## 2024-02-18 NOTE — Telephone Encounter (Signed)
Called and informed patient of missed visit and provided reminder of next appt and attendance policy via VM. 
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
## 2024-02-20 ENCOUNTER — Ambulatory Visit

## 2024-02-20 ENCOUNTER — Ambulatory Visit (INDEPENDENT_AMBULATORY_CARE_PROVIDER_SITE_OTHER): Admitting: Student

## 2024-02-20 ENCOUNTER — Encounter: Payer: Self-pay | Admitting: Student

## 2024-02-20 VITALS — BP 130/82 | HR 67 | Ht 64.0 in | Wt 292.8 lb

## 2024-02-20 DIAGNOSIS — Z9889 Other specified postprocedural states: Secondary | ICD-10-CM

## 2024-02-20 DIAGNOSIS — M79604 Pain in right leg: Secondary | ICD-10-CM

## 2024-02-20 DIAGNOSIS — E559 Vitamin D deficiency, unspecified: Secondary | ICD-10-CM

## 2024-02-20 DIAGNOSIS — E213 Hyperparathyroidism, unspecified: Secondary | ICD-10-CM

## 2024-02-20 DIAGNOSIS — Z9089 Acquired absence of other organs: Secondary | ICD-10-CM | POA: Diagnosis not present

## 2024-02-20 DIAGNOSIS — Z6841 Body Mass Index (BMI) 40.0 and over, adult: Secondary | ICD-10-CM

## 2024-02-20 DIAGNOSIS — M79605 Pain in left leg: Secondary | ICD-10-CM

## 2024-02-20 MED ORDER — VITAMIN D (ERGOCALCIFEROL) 1.25 MG (50000 UNIT) PO CAPS: 50000.0000 [IU] | ORAL_CAPSULE | ORAL | 0 refills | Status: DC
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
## 2024-02-20 NOTE — Patient Instructions (Addendum)
 Good to see you!  I will follow up with our pharmacy team about Wegovy  and try again.  You did so well when you were on it. It seems wrong to not be able to have you on the medication that was working.   I am glad you've cut back on cigarettes. This might be another barrier to surgery. Let me know if we can increase our coverage here. Koval will see you on 7/11.  I am repeating PTH labs, but I *think* this is from your low Vit D. Let's restart weekly supplementation and check back in 3 months.   JINNY Con Gasman, MD

## 2024-02-21 ENCOUNTER — Other Ambulatory Visit (HOSPITAL_COMMUNITY): Payer: Self-pay

## 2024-02-21 NOTE — Progress Notes (Signed)
    SUBJECTIVE:   CHIEF COMPLAINT / HPI:   Hyperparathyroidism She is about a year out from a parathyroidectomy for an adenoma.  Unfortunately, her recent repeat labs again showed possible primary hyperparathyroidism with calcium  10.5, PTH 98.  Thyroid  ultrasound showed what may be parathyroid  nodules and a possible right thyroid  nodule, but repeat sestamibi scan did not show evidence of a parathyroid  adenoma.  She has been demonstrated to have a vitamin D  deficiency.  Has been off of supplementation for a few months now.  Weight Loss Efforts Ongoing frustration with inability to access semaglutide .  She needs to get her BMI less than 40 to be a candidate for knee replacement surgery.  Previously lost over 45 pounds on semaglutide .  Unfortunately, not recently covered by her insurance.  She thinks that her insurance did recently change.  She is dual eligible but intermittently in the past has had Medicare but not Medicaid.  She is requesting that I look into the possibility of coverage again.  She is already on Wellbutrin  and naltrexone  to augment weight loss efforts.  Has been trying to eat fewer carbohydrates but is frustrated that her weight continues to trend upward.  She is still well below her recent max weight of 311 pounds.  Smoking Still smoking about a pack a day.  However notes that this is down from high of 2 packs a day.  Continues to voice motivation to quit.  Finds working with Dr. Koval especially helpful here.  Remains on Wellbutrin , Chantix .  OBJECTIVE:   BP 130/82   Pulse 67   Ht 5' 4 (1.626 m)   Wt 292 lb 12.8 oz (132.8 kg)   LMP 02/21/2015   SpO2 98%   BMI 50.26 kg/m   General: alert & oriented, no apparent distress, well groomed HEENT: normocephalic, atraumatic, EOM grossly intact, oral mucosa moist, neck supple Respiratory: normal respiratory effort GI: non-distended Skin: no rashes, no jaundice Psych: appropriate mood and affect   ASSESSMENT/PLAN:    Assessment & Plan Hyperparathyroidism (HCC) A bit of an unusual picture at this point.  Most recent labs showing what appears to be primary hyperparathyroidism.  Nodules noted on ultrasound but sestamibi scan without evidence of adenoma.  Thankfully, she does seem to be asymptomatic of this. -Repeat PTH, intact calcium  -Will start vitamin D  supplementation, treatment of her vitamin D  deficiency may very well help here.  50,000 units weekly x 12 weeks. -Unless above labs show marked increase in her hypercalcemia and as long as she remains asymptomatic, I believe it is likely safe to wait 3 months to repeat labs once vitamin D  supplementation is on board. BMI 50.0-59.9, adult (HCC) Ongoing weight loss efforts.  On Wellbutrin  and naltrexone .  I would love to get her back on semaglutide  given the marked success that she has had in the past.  Given the change in her insurance, I will message our pharmacy team and have them look into the possibility of coverage in her case. -Continue Wellbutrin  XL 150 mg daily and naltrexone  50 mg daily     J Con Gasman, MD [facility]

## 2024-02-21 NOTE — Assessment & Plan Note (Signed)
 Ongoing weight loss efforts.  On Wellbutrin  and naltrexone .  I would love to get her back on semaglutide  given the marked success that she has had in the past.  Given the change in her insurance, I will message our pharmacy team and have them look into the possibility of coverage in her case. -Continue Wellbutrin  XL 150 mg daily and naltrexone  50 mg daily

## 2024-02-22 LAB — PTH, INTACT AND CALCIUM
Calcium: 9.8 mg/dL (ref 8.7–10.2)
PTH: 115 pg/mL — AB (ref 15–65)

## 2024-02-24 ENCOUNTER — Ambulatory Visit: Payer: Self-pay | Admitting: Student

## 2024-03-03 ENCOUNTER — Ambulatory Visit: Admitting: Pharmacist

## 2024-03-11 ENCOUNTER — Ambulatory Visit

## 2024-03-23 ENCOUNTER — Telehealth: Payer: Self-pay | Admitting: Pharmacist

## 2024-03-23 NOTE — Telephone Encounter (Signed)
 Attempted to contact patient for follow-up of missed appointment 03/02/2024   Left HIPAA compliant voice mail requesting call back to direct phone: [phone] or [phone] to reschedule  Total time with patient call and documentation of interaction: 6 minutes.

## 2024-04-01 ENCOUNTER — Ambulatory Visit (INDEPENDENT_AMBULATORY_CARE_PROVIDER_SITE_OTHER): Admitting: Podiatry

## 2024-04-01 ENCOUNTER — Encounter: Payer: Self-pay | Admitting: Podiatry

## 2024-04-01 DIAGNOSIS — B351 Tinea unguium: Secondary | ICD-10-CM

## 2024-04-01 DIAGNOSIS — M79674 Pain in right toe(s): Secondary | ICD-10-CM

## 2024-04-01 DIAGNOSIS — M79675 Pain in left toe(s): Secondary | ICD-10-CM | POA: Diagnosis not present

## 2024-04-03 ENCOUNTER — Telehealth: Payer: Self-pay | Admitting: Pharmacist

## 2024-04-03 NOTE — Telephone Encounter (Signed)
 Attempted to contact patient for follow-up of missed appointment, Weight loss and Tobacco intake management.    Left HIPAA compliant voice mail requesting call back to direct phone: [phone]  Total time with patient call and documentation of interaction: 4 minutes.

## 2024-04-03 NOTE — Telephone Encounter (Signed)
 Patient returned call, requesting call back.   Patient contacted and we scheduled follow-up appointment next week.   She thanked me for the call and requested help with weight loss at our upcoming visit.

## 2024-04-05 NOTE — Progress Notes (Signed)
  Subjective:  Patient ID: Kendra [name], female    DOB: 08-18-1966,  MRN: [id number]  Kendra [name] presents to clinic today for callus(es) right foot, porokeratotic lesion(s) left foot, and painful mycotic nails. Painful toenails interfere with ambulation. Aggravating factors include wearing enclosed shoe gear. Pain is relieved with periodic professional debridement. Painful callus(es) and porokeratotic lesion(s) are aggravated when weightbearing with and without shoegear. Pain is relieved with periodic professional debridement.  Chief Complaint  Patient presents with   RFC     RFC Non diabetic toenail trim LOV with PCP 10/2023.   New problem(s): None.   PCP is Theophilus Pagan, MD.  Allergies  Allergen Reactions   Lisinopril  Swelling    Review of Systems: Negative except as noted in the HPI.  Objective: No changes noted in today's physical examination. There were no vitals filed for this visit. Kendra [name] is a pleasant 58 y.o. female morbidly obese in NAD. AAO x 3.  Vascular Examination: Capillary refill time immediate b/l. Vascular status intact b/l with palpable pedal pulses. Pedal hair present b/l. No pain with calf compression b/l. Skin temperature gradient WNL b/l. No cyanosis or clubbing b/l. No ischemia or gangrene noted b/l. Trace edema noted BLE.  Neurological Examination: Sensation grossly intact b/l with 10 gram monofilament. Vibratory sensation intact b/l.   Dermatological Examination: Pedal skin with normal turgor, texture and tone b/l.  No open wounds. No interdigital macerations.   Toenails 1-5 b/l thick, discolored, elongated with subungual debris and pain on dorsal palpation.   Hyperkeratotic lesion(s) submet head 5 right foot.  No erythema, no edema, no drainage, no fluctuance. Porokeratotic lesion(s) submet head 1 right foot. No erythema, no edema, no drainage, no fluctuance.  Musculoskeletal Examination: Muscle strength 5/5 to all lower extremity  muscle groups bilaterally. Plantarflexed metatarsal(s) 1st metatarsal head right foot.  Radiographs: None  Assessment/Plan: 1. Pain due to onychomycosis of toenails of both feet    -Consent given for treatment as described below: -Will submit preauthorization to insurance company for paring of calluses. -Patient to continue soft, supportive shoe gear daily. -Mycotic toenails 1-5 bilaterally were debrided in length and girth with sterile nail nippers and dremel without incident. -As a courtesy, callus(es) submet head 5 right foot pared utilizing sterile scalpel blade without complication or incident. Total number pared=1. -As a courtesy, porokeratotic lesion(s) submet head 1 left foot pared and enucleated without complication or incident. Total number pared=1. -Patient/POA to call should there be question/concern in the interim.   Return in about 3 months (around 07/02/2024).  Delon LITTIE Merlin, DPM      [facility] LOCATION: [address], [place] [address]                   Office [phone]   [facility] LOCATION: [address] [place], [place] [address] Office [phone]

## 2024-04-06 ENCOUNTER — Ambulatory Visit (INDEPENDENT_AMBULATORY_CARE_PROVIDER_SITE_OTHER): Admitting: Pharmacist

## 2024-04-06 ENCOUNTER — Encounter: Payer: Self-pay | Admitting: Pharmacist

## 2024-04-06 DIAGNOSIS — F17200 Nicotine dependence, unspecified, uncomplicated: Secondary | ICD-10-CM

## 2024-04-06 MED ORDER — VARENICLINE TARTRATE 1 MG PO TABS: 1.0000 mg | ORAL_TABLET | Freq: Two times a day (BID) | ORAL | 1 refills | Status: AC

## 2024-04-06 MED ORDER — WEGOVY 0.25 MG/0.5ML ~~LOC~~ SOAJ: 0.2500 mg | SUBCUTANEOUS | Status: DC

## 2024-04-06 NOTE — Progress Notes (Signed)
 S:   Chief Complaint  Patient presents with   Medication Management    Tobacco Cessation and Weight loss   58 y.o. female who presents for evaluation/assistance with tobacco dependence and weight loss management. Patient is in good spirits.   Patient was referred and last seen by Primary Care Provider, Dr. Marlee, on 02/20/24.  At last visit, patient continued on bupropion  and naltrexone . Previous success on Wegovy  (semaglutide ) but stopped d/t insurance change.    PMH is significant for tobacco use disorder, OA in knees.   Age when started using tobacco on a daily basis 35. Number of cigarettes/day: 1 ppd, this is down from history of 3-4 ppd. Denies waking to smoke. Reorts waiting at least 30 minutes after waking and eating to smoke. Reports smoking every 2-3 hours, denies chainsmoking. Reports smoking most often in the evenings.   Most recent quit attempt: ran out of refills of Chantix  (varenicline ) one month ago. Patient reports Chantix  (varenicline ) working well for her, reports no adverse effects. Reports she was able to decrease to 0.5 ppd while on medication, but has increased in the past month since she's been out.  Medications used in past cessation efforts include: varenicline , bupropion . No other previous trials of other NRTs.  Most common triggers to use tobacco include: stress associated with relationships, knee pain, and managing her mother's health and boredom at night.   Exercise: Patient reports difficulty exercising d/t knee pain. Reports success with leg and arm chair exercises. Other activity includes walking while grocery shopping and other errands.  Diet: Patient reports eating one large meal a day and snacking a lot at nighttime. Denies eating breakfast:  Food: Reports eating a lot of starches, eats rice every day. Reports eating chicken, green beans, corn. Snacks: Reports lots of sweets late at night, cookies, candy.  Drinks: Sugary soda ~6-7 cans/day.  Reports recently trying zero sugar Sunkist packets added to water .  O:  Review of Systems  Musculoskeletal:  Positive for joint pain (knee).  All other systems reviewed and are negative.   Physical Exam Constitutional:      Appearance: Normal appearance.  Pulmonary:     Effort: Pulmonary effort is normal.  Neurological:     Mental Status: She is alert.  Psychiatric:        Mood and Affect: Mood normal.        Behavior: Behavior normal.        Thought Content: Thought content normal.        Judgment: Judgment normal.    Patient is participating in a Managed Medicaid Plan:  Yes  A/P: Tobacco use disorder with severe nicotine  dependence of >30 years duration in a patient who is fair candidate for success because of previous success reduction to 0.5 ppd on Chantix  (varenicline ) and continued motivation to quit. Goal is to reduce from 1 ppd to 0.5 ppd by next visit in 4 weeks. Goal quit date is 08/26/24. -Restarted varenicline  1 mg by mouth once daily with food x7 days, then 1 mg by mouth twice daily with food thereafter. Refills sent into pharmacy. Patient counseled on purpose, proper use, and potential adverse effects, including GI upset. -Continued bupropion  XL 150 mg daily.   Weight-loss is currently uncontrolled BMI 50.22. Patient reports previous success with Ozempic  (semaglutide ) in the past, but stopped using d/t lack of coverage with changing insurances. Patient reports high desire to restart GLP-1RA, inappropriate for Qysimia (suggested by community pharmacist) at this time d/t current use of bupropion   XL and naltrexone .  -Initiated Wegovy  (semaglutide ) 0.25 mg weekly.  -Continue all other medications the same.  -Counseled patient on diet changes such as controlling portion sizes of carbs (rice), increasing leafy greens, and switching from sugary soda to diet or zero-sugar. -Counseled patient on continuing chair exercises and increasing physical activity duration and  frequency.  Written patient instructions provided. Patient verbalized understanding of treatment plan.  Total time in face to face counseling 33 minutes.    Follow-up:  Pharmacist 05/06/24 11:00am PCP clinic visit 05/13/24 Patient seen with Fonda Blase, PharmD Candidate - PY3 student and Calton Nash, PharmD Candidate - PY4 student.

## 2024-04-06 NOTE — Assessment & Plan Note (Signed)
 Tobacco use disorder with severe nicotine  dependence of >30 years duration in a patient who is fair candidate for success because of previous success reduction to 0.5 ppd on Chantix  (varenicline ) and continued motivation to quit. Goal is to reduce from 1 ppd to 0.5 ppd by next visit in 4 weeks. Goal quit date is 08/26/24. -Restarted varenicline  1 mg by mouth once daily with food x7 days, then 1 mg by mouth twice daily with food thereafter. Refills sent into pharmacy. Patient counseled on purpose, proper use, and potential adverse effects, including GI upset. -Continued bupropion  XL 150 mg daily.

## 2024-04-06 NOTE — Patient Instructions (Signed)
 Nice to see you today! Keep up the good work with decreasing the number of cigarettes you're smoking per day! Continue to make small dietary changes such as decreasing portion sizes of rice, increasing green vegetables, and switching from full-sugar sodas to diet or zero sugar sodas.    Medication Changes: - Re-START Chantix  (varenicline ) 1 mg tablet once daily with food once daily for one week, then increase to twice daily with food.  -START Wegovy  (semaglutide ) 0.25 mg subcutaneously once weekly.  - Continue all other medication the same.  Tobacco Patient Instructions  Quitting smoking is one of the most important decisions you can make for your current and future health. Consider what you dislike about smoking and how quitting could personally benefit you. Try to cut down.   Aim for reducing the amount you smoke by 0.5 ppd over the next 4 weeks.  My target quit date is: 08/26/24  Starting today, Be a Quitter!  Remind yourself why you want to quit.  Delay your first cigarette of the day for as long as possible.  Start cleaning out all pockets, drawers, and your car of cigarettes.  Getting Through the Cravings Once You Are Smoke Free: Each craving will last about 10 minutes, whether or not you smoke. Here's how to get through the cravings without cigarettes:  DELAY: Tell yourself that you'll wait for the next craving. Do it every time! DEEP BREATHS: One reason smoking feels good is because you breathe in deeply to inhale. Take four slow, deep breaths and feel the relaxation without the hamful effects of cigarettes. DRINK WATER : Drink a glass of cool water . It will give your hands and mouth something to do and will help flush the nicotine  out of your system faster. DIVERT: Do something else -- brush your teeth, take a walk, call a friend who can offer you support. Just moving onto something other than thinking about cigarettes will move you through the craving.   Frequently Asked  Questions  What can I do when I get the urge to smoke? To get through the urge to smoke, try the following:  Review your reasons for quitting and think of all the benefits to your health, your finances, and your family.  Remind yourself that there is no such thing as just one cigarette -- or even one puff.  Ride out the desire to smoke. Use the 4 Os -- Delay, Deep Breaths, Drink Water  and Divert to get you through. The craving will go away eventually. Do not fool yourself into thinking you can have just one cigarette.  Any tips on how to deal with stress? Stress is a natural part of life. The key is to deal with it without reaching for a cigarette. Taking deep breaths, counting backwards from 10 and asking yourself 1-how big a deal is this?"  Writing down your feelings, talking with a friend and doing things like positive self-talk and meditation are some other ways that people deal with daily stress.  What if I start smoking again? Slips happen. Most people try to quit smoking a few times before they are successful. Don't beat yourself up if this happens to you! Ask yourself if this was a slip or a relapse. A slip is a one-time mistake that is quickly corrected. A relapse is going back to your old smoking habits.   If you slip, don't give up. Think of it as a learning experience. Ask yourself what went wrong and renew your commitment to staying away  from smoking for good.  If you relapse, try not to get discouraged. Ask yourself the question "What caused me to start smoking?" Figure out what helped you and what didn't when you tried to quit. Knowing why you relapsed is useful information for your next attempt to quit.  Please bring all medications to your clinic visits.  Please arrive 10-15 minutes prior to your scheduled visit time.

## 2024-04-06 NOTE — Assessment & Plan Note (Signed)
 Weight-loss is currently uncontrolled BMI 50.22. Patient reports previous success with Ozempic  (semaglutide ) in the past, but stopped using d/t lack of coverage with changing insurances. Patient reports high desire to restart GLP-1RA, inappropriate for Qysimia (suggested by community pharmacist) at this time d/t current use of bupropion  XL and naltrexone .  -Initiated Wegovy  (semaglutide ) 0.25 mg weekly.  -Continue all other medications the same.  -Counseled patient on diet changes such as controlling portion sizes of carbs (rice), increasing leafy greens, and switching from sugary soda to diet or zero-sugar. -Counseled patient on continuing chair exercises and increasing physical activity duration and frequency.

## 2024-04-07 ENCOUNTER — Ambulatory Visit: Admitting: Pharmacist

## 2024-04-07 NOTE — Progress Notes (Signed)
 Reviewed and agree with Dr Rennis plan.

## 2024-05-06 ENCOUNTER — Other Ambulatory Visit (HOSPITAL_COMMUNITY): Payer: Self-pay

## 2024-05-06 ENCOUNTER — Ambulatory Visit: Admitting: Pharmacist

## 2024-05-06 ENCOUNTER — Encounter: Payer: Self-pay | Admitting: Pharmacist

## 2024-05-06 DIAGNOSIS — E210 Primary hyperparathyroidism: Secondary | ICD-10-CM | POA: Diagnosis not present

## 2024-05-06 DIAGNOSIS — F17200 Nicotine dependence, unspecified, uncomplicated: Secondary | ICD-10-CM

## 2024-05-06 MED ORDER — WEGOVY 0.25 MG/0.5ML ~~LOC~~ SOAJ
0.2500 mg | SUBCUTANEOUS | Status: DC
Start: 2024-05-06 — End: 2024-05-28

## 2024-05-06 MED ORDER — CITRACAL MAXIMUM PLUS PO TABS
1.0000 | ORAL_TABLET | Freq: Two times a day (BID) | ORAL | 3 refills | Status: AC
Start: 2024-05-06 — End: ?

## 2024-05-06 NOTE — Assessment & Plan Note (Signed)
 Weight-loss is currently uncontrolled BMI 51.15. Patient reports previous success with Ozempic  (semaglutide ) in the past, but stopped using due to lack of coverage with changing insurances.  -Continued Wegovy  (semaglutide ) 0.25 mg weekly with Sample and will run a test claim to see if higher dose is covered.  -Continue all other medications the same.  -Counseled patient on continuing diet changes such as controlling portion sizes of carbs (rice), increasing leafy greens, and switching from sugary soda to diet or zero-sugar. -Counseled patient on continuing chair exercises and increasing physical activity duration and frequency.

## 2024-05-06 NOTE — Patient Instructions (Signed)
 Nice to see you today!   Medication Changes: - START Citracal 1 tablet a day for 7 days. After start taking 1 tablet twice daily in the morning and at night  - Continue Wegovy  (semaglutide ) 0.25 mg weekly  - Continue all other medication the same.   Tobacco Patient Instructions  Quitting smoking is one of the most important decisions you can make for your current and future health. Consider what you dislike about smoking and how quitting could personally benefit you. Try to cut down.   Aim for reducing the amount you smoke less than 1 pack a day.  Starting today, Be a Quitter!  Remind yourself why you want to quit.  Delay your first cigarette of the day for as long as possible.  Start cleaning out all pockets, drawers, and your car of cigarettes.  Getting Through the Cravings Once You Are Smoke Free: Each craving will last about 10 minutes, whether or not you smoke. Here's how to get through the cravings without cigarettes:  DELAY: Tell yourself that you'll wait for the next craving. Do it every time! DEEP BREATHS: One reason smoking feels good is because you breathe in deeply to inhale. Take four slow, deep breaths and feel the relaxation without the hamful effects of cigarettes. DRINK WATER : Drink a glass of cool water . It will give your hands and mouth something to do and will help flush the nicotine  out of your system faster. DIVERT: Do something else -- brush your teeth, take a walk, call a friend who can offer you support. Just moving onto something other than thinking about cigarettes will move you through the craving.   Frequently Asked Questions  What can I do when I get the urge to smoke? To get through the urge to smoke, try the following:  Review your reasons for quitting and think of all the benefits to your health, your finances, and your family.  Remind yourself that there is no such thing as just one cigarette -- or even one puff.  Ride out the desire to smoke.  Use the 4 Os -- Delay, Deep Breaths, Drink Water  and Divert to get you through. The craving will go away eventually. Do not fool yourself into thinking you can have just one cigarette.  Any tips on how to deal with stress? Stress is a natural part of life. The key is to deal with it without reaching for a cigarette. Taking deep breaths, counting backwards from 10 and asking yourself 1-how big a deal is this?"  Writing down your feelings, talking with a friend and doing things like positive self-talk and meditation are some other ways that people deal with daily stress.  What if I start smoking again? Slips happen. Most people try to quit smoking a few times before they are successful. Don't beat yourself up if this happens to you! Ask yourself if this was a slip or a relapse. A slip is a one-time mistake that is quickly corrected. A relapse is going back to your old smoking habits.   If you slip, don't give up. Think of it as a learning experience. Ask yourself what went wrong and renew your commitment to staying away from smoking for good.  If you relapse, try not to get discouraged. Ask yourself the question "What caused me to start smoking?" Figure out what helped you and what didn't when you tried to quit. Knowing why you relapsed is useful information for your next attempt to quit.  Please bring  all medications to your clinic visits.  Please arrive 10-15 minutes prior to your scheduled visit time.

## 2024-05-06 NOTE — Assessment & Plan Note (Signed)
 Tobacco use disorder with moderate nicotine  dependence of ~71 pack years duration in a patient who is fair candidate for success..    -Continued bupropion  XL 150 mg daily. Patient with no past medical history of seizures. Patient counseled on purpose, proper use, and potential adverse effects.  -Continued varenicline  1 mg by mouth twice daily with food. Patient counseled on purpose, proper use, and potential adverse effects. -Provided information on 1 800-QUIT NOW support program.

## 2024-05-06 NOTE — Assessment & Plan Note (Signed)
 Patient with history of Primary Hyperparathyroidism since 12/2022 currently taking Vitamin D2 50000 units weekly -Started Citracal 1 tablet once daily for 7 days then increase to 1 tablet twice daily.

## 2024-05-06 NOTE — Progress Notes (Signed)
 S:   Chief Complaint  Patient presents with   Medication Management    Tobacco cessation/ weight loss   58 y.o. female who presents for evaluation/assistance with tobacco dependence. Patient is in good spirits.    Patient was referred and last seen by Primary Care Provider, Dr. Elsa, on 02/20/24.  At last visit, was smoking 1 ppd and was started on Wegovy  (semaglutide ).  PMH is significant for TUD, Obesity, Primary Hyperparathyroidism.   Currently still smoking 1 PPD  Reports sustained recent stress has led to no progress on tobacco cessation  Current medications for cessation: Bupropion  XL 150 mg 1 tablet daily, varenicline  1 mg twice daily with meals(reports being primary factor as to why she can maintain 1 ppd without increasing)  Reports adherence to all medications Reports no side effects with Wegovy  (semaglutide ) but has gained 6 lbs since last visit.  Rates IMPORTANCE of quitting tobacco on 1-10 scale of 10. Rates CONFIDENCE low due to current stress   Reports Dietary habits: 1-2 bowls of cereal a day with milk Reports diet soda and zero sugar sodas only now No salt Oreos (3-4 cookies per night)   O: Clinical ASCVD: Yes   Review of Systems  All other systems reviewed and are negative.   Physical Exam Vitals reviewed.  Constitutional:      Appearance: Normal appearance.  Pulmonary:     Effort: Pulmonary effort is normal.  Musculoskeletal:        General: Swelling present.     Right lower leg: Edema (2+) present.     Left lower leg: Edema (1+) present.  Neurological:     Mental Status: She is alert.  Psychiatric:        Mood and Affect: Mood normal.        Behavior: Behavior normal.        Thought Content: Thought content normal.        Judgment: Judgment normal.    Today's Vitals   05/06/24 1057  BP: 129/74  Pulse: 81  SpO2: 98%  Weight: 298 lb (135.2 kg)   Body mass index is 51.15 kg/m.  Patient is participating in a Managed Medicaid  Plan:  Yes   A/P: Tobacco use disorder with moderate nicotine  dependence of ~71 pack years duration in a patient who is fair candidate for success..    -Continued bupropion  XL 150 mg daily. Patient with no past medical history of seizures. Patient counseled on purpose, proper use, and potential adverse effects.  -Continued varenicline  1 mg by mouth twice daily with food. Patient counseled on purpose, proper use, and potential adverse effects. -Encouraged to try to get to <1 PPD by following visit -Provided information on 1 800-QUIT NOW support program.   Weight-loss is currently uncontrolled BMI 51.15. Patient reports previous success with Ozempic  (semaglutide ) in the past, but stopped using due to lack of coverage with changing insurances.  -Continued Wegovy  (semaglutide ) 0.25 mg weekly with Sample and will run a test claim to see if higher dose is covered.  NOT covered. -Continue all other medications the same.  -Counseled patient on continuing diet changes such as controlling portion sizes of carbs (rice), increasing leafy greens, and switching from sugary soda to diet or zero-sugar. -Counseled patient on continuing chair exercises and increasing physical activity duration and frequency.  Patient with history of Primary Hyperparathyroidism since 12/2022 currently taking Vitamin D2 50000 units weekly -Started Citracal 1 tablet once daily for 7 days then increase to 1 tablet twice daily.  Written patient instructions provided. Patient verbalized understanding of treatment plan.  Total time in face to face counseling 27 minutes.    Follow-up:  PCP clinic visit with Dr. Theophilus 05/13/24 Patient seen with Fonda Blase, PharmD Candidate - [employer]3 student and Estefana Blase, PharmD Candidate - [employer] student.

## 2024-05-08 NOTE — Progress Notes (Signed)
 Reviewed and agree with Dr Rennis plan.

## 2024-05-12 NOTE — Progress Notes (Unsigned)
    SUBJECTIVE:   CHIEF COMPLAINT / HPI:   Tobacco use Follows with Dr. Ruthann, currently on bupropion  150 mg daily and Chantix  1 mg twice daily.  Goal to get under 1 pack/day.  Hyperparathyroidism History of parathyroidectomy for adenoma.  Taking vitamin D  weekly, started on Citracal 1 tablet twice daily.  PERTINENT  PMH / PSH: ***  OBJECTIVE:   LMP 02/21/2015  ***  General: NAD, pleasant, able to participate in exam Cardiac: RRR, no murmurs. Respiratory: CTAB, normal effort, No wheezes, rales or rhonchi Abdomen: Bowel sounds present, nontender, nondistended Extremities: no edema or cyanosis. Skin: warm and dry, no rashes noted Neuro: alert, no obvious focal deficits Psych: Normal affect and mood  ASSESSMENT/PLAN:   No problem-specific Assessment & Plan notes found for this encounter.     Dr. Izetta Nap, DO [place] [facility]    {    This will disappear when note is signed, click to select method of visit    :1}

## 2024-05-13 ENCOUNTER — Ambulatory Visit (INDEPENDENT_AMBULATORY_CARE_PROVIDER_SITE_OTHER): Admitting: Family Medicine

## 2024-05-13 ENCOUNTER — Encounter: Payer: Self-pay | Admitting: Family Medicine

## 2024-05-13 VITALS — BP 131/77 | HR 67 | Ht 64.0 in | Wt 294.2 lb

## 2024-05-13 DIAGNOSIS — E210 Primary hyperparathyroidism: Secondary | ICD-10-CM | POA: Diagnosis not present

## 2024-05-13 DIAGNOSIS — Z23 Encounter for immunization: Secondary | ICD-10-CM

## 2024-05-13 DIAGNOSIS — E213 Hyperparathyroidism, unspecified: Secondary | ICD-10-CM

## 2024-05-13 DIAGNOSIS — F17200 Nicotine dependence, unspecified, uncomplicated: Secondary | ICD-10-CM | POA: Diagnosis not present

## 2024-05-13 LAB — POCT GLYCOSYLATED HEMOGLOBIN (HGB A1C): Hemoglobin A1C: 5.6 % (ref 4.0–5.6)

## 2024-05-13 MED ORDER — BUPROPION HCL ER (XL) 300 MG PO TB24: 300.0000 mg | ORAL_TABLET | Freq: Every day | ORAL | 2 refills | Status: AC

## 2024-05-13 NOTE — Assessment & Plan Note (Signed)
 A1c normal.  Currently receiving Wegovy  samples, 0.25 mg weekly and down around 4 pounds since last visit. -Lipid panel

## 2024-05-13 NOTE — Patient Instructions (Signed)
 It was wonderful to see you today! Thank you for choosing [facility].   Please bring ALL of your medications with you to every visit.   Today we talked about:  I am repeating some labs today to look at your calcium  level and your parathyroid  hormone level since you are treated for your vitamin D  deficiency.  I will follow-up with those results when they are available. Please continue to take your Symbicort , if you are experiencing worsening shortness of breath or wheezing please let me know as we can further optimize your COPD.  You will be due for your low-dose CT for lung cancer screening in December of this year. For your smoking cessation I did increase you to bupropion  300 mg daily that you will take in addition to the Chantix .  Please continue to try to cut back the amount you smoke as much as possible.  As we discussed this will likely make your symptoms of COPD worse over time if you continue to smoke. We are also checking your cholesterol today.  Please continue on the Repatha  and take your aspirin  daily.  Please follow up in 3 months   If you haven't already, sign up for My Chart to have easy access to your labs results, and communication with your primary care physician.   We are checking some labs today. If they are abnormal, I will call you. If they are normal, I will send you a MyChart message (if it is active) or a letter in the mail. If you do not hear about your labs in the next 2 weeks, please call the office.  Call the clinic at [phone] if your symptoms worsen or you have any concerns.  Please be sure to schedule follow up at the front desk before you leave today.   Izetta Nap, DO Family Medicine

## 2024-05-13 NOTE — Assessment & Plan Note (Signed)
 Follows with Dr. Koval for smoking cessation, currently on Chantix  and bupropion  but has been unable to cut back to less than 1 ppd. -Increase to bupropion  300 mg daily

## 2024-05-13 NOTE — Assessment & Plan Note (Signed)
 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  Subsequent blood work in 01/2024 concerning for development of secondary hyperparathyroidism due to vitamin D  deficiency and received high-dose vitamin D  supplementation x 12 weeks.  Unclear if this is recurrence of parathyroid  adenoma, will repeat calcium , PTH and vitamin D  testing if persistently abnormal consider follow-up imaging. -Calcium , PTH, vitamin D 

## 2024-05-15 ENCOUNTER — Ambulatory Visit: Payer: Self-pay | Admitting: Family Medicine

## 2024-05-15 DIAGNOSIS — E559 Vitamin D deficiency, unspecified: Secondary | ICD-10-CM

## 2024-05-15 DIAGNOSIS — E782 Mixed hyperlipidemia: Secondary | ICD-10-CM

## 2024-05-15 LAB — LIPID PANEL
Chol/HDL Ratio: 4.1 ratio (ref 0.0–4.4)
Cholesterol, Total: 208 mg/dL — ABNORMAL HIGH (ref 100–199)
HDL: 51 mg/dL (ref >39)
LDL Chol Calc (NIH): 141 mg/dL — ABNORMAL HIGH (ref 0–99)
Triglycerides: 88 mg/dL (ref 0–149)
VLDL Cholesterol Cal: 16 mg/dL (ref 5–40)

## 2024-05-15 LAB — PTH, INTACT AND CALCIUM
Calcium: 10 mg/dL (ref 8.7–10.2)
PTH: 127 pg/mL — AB (ref 15–65)

## 2024-05-15 LAB — VITAMIN D 25 HYDROXY (VIT D DEFICIENCY, FRACTURES): Vit D, 25-Hydroxy: 17 ng/mL — ABNORMAL LOW (ref 30.0–100.0)

## 2024-05-15 MED ORDER — VITAMIN D (ERGOCALCIFEROL) 1.25 MG (50000 UNIT) PO CAPS: 50000.0000 [IU] | ORAL_CAPSULE | ORAL | 0 refills | Status: DC

## 2024-05-15 MED ORDER — ROSUVASTATIN CALCIUM 40 MG PO TABS: 40.0000 mg | ORAL_TABLET | Freq: Every day | ORAL | 1 refills | Status: AC

## 2024-05-15 NOTE — Telephone Encounter (Signed)
 Reports she still has 2 weeks left of vitamin D  Supple infusion.  Given persistently low vitamin D  in the setting of elevated parathyroid  hormone will prolong supplementation for 12 more weeks.  Will need appointment in 3 months for repeat PTH, calcium  and vitamin D  level.  If persistently elevated PTH despite normal vitamin D  will need imaging for further evaluation of recurrence of parathyroid  adenoma.  Lipids above goal, compliant on rosuvastatin  20 mg daily.  Will increase to Rosuvastatin  40 mg daily.  Izetta Nap, DO

## 2024-05-25 ENCOUNTER — Telehealth: Payer: Self-pay | Admitting: Pharmacist

## 2024-05-25 NOTE — Telephone Encounter (Signed)
 Patient contacted for follow-up of Wegovy  (semaglutide ) provided with samples is running out. Has last dose this week. Discussed transition to Ozempic  (semaglutide ) which can be provided with samples and titrated up as needed. Patient amenable to trying transition and appointment scheduled for 10/2.   Patient denies any significant medication related side effects.   Total time with patient call and documentation of interaction: 3 minutes.

## 2024-05-25 NOTE — Telephone Encounter (Signed)
 Reviewed and agree with Dr Rennis plan.

## 2024-05-28 ENCOUNTER — Ambulatory Visit: Admitting: Pharmacist

## 2024-05-28 VITALS — Wt 294.6 lb

## 2024-05-28 DIAGNOSIS — F17200 Nicotine dependence, unspecified, uncomplicated: Secondary | ICD-10-CM | POA: Diagnosis not present

## 2024-05-28 MED ORDER — WEGOVY 0.5 MG/0.5ML ~~LOC~~ SOAJ: 0.5000 mg | SUBCUTANEOUS | 2 refills | Status: DC

## 2024-05-28 NOTE — Assessment & Plan Note (Signed)
 Weight-loss is currently uncontrolled BMI 50. Patient reports tolerating Wegovy  (semaglutide ) and feels like she has lost weight.  She confirms that she has been diagnosed with CAD in 2020. -Continued Wegovy  (semaglutide ) 0.25 mg weekly with final week of Sample.  We discussed new coverage criteria and we will pursue coverage with Wegovy  (semaglutide ) at 0.5mg  weekly.   -Counseled patient on continuing diet changes such as controlling portion sizes of carbs (rice), increasing leafy greens, and switching from sugary soda to diet or zero-sugar. -Counseled patient on continuing chair exercises and increasing physical activity duration and frequency. Patient is NOT interested in using exposed needle injection strategy for use of GLP (prefers autoinjection devices).

## 2024-05-28 NOTE — Patient Instructions (Signed)
 Nice to see you today!  We will work on your Wegovy  (semaglutide ) approval from [place].    Medication Changes: - Continue all other medication the same.   Tobacco Patient Instructions  Quitting smoking is one of the most important decisions you can make for your current and future health. Consider what you dislike about smoking and how quitting could personally benefit you. Try to cut down.   Aim for reducing the amount you smoke by at least 5 cigaretts over the next 4 weeks.  My target quit date is: 08/26/2024 or sooner!  Starting today, Be a Quitter!  Remind yourself why you want to quit.  Delay your first cigarette of the day for as long as possible.  Start cleaning out all pockets, drawers, and your car of cigarettes.  Getting Through the Cravings Once You Are Smoke Free: Each craving will last about 10 minutes, whether or not you smoke. Here's how to get through the cravings without cigarettes:  DELAY: Tell yourself that you'll wait for the next craving. Do it every time! DEEP BREATHS: One reason smoking feels good is because you breathe in deeply to inhale. Take four slow, deep breaths and feel the relaxation without the hamful effects of cigarettes. DRINK WATER : Drink a glass of cool water . It will give your hands and mouth something to do and will help flush the nicotine  out of your system faster. DIVERT: Do something else -- brush your teeth, take a walk, call a friend who can offer you support. Just moving onto something other than thinking about cigarettes will move you through the craving.   Frequently Asked Questions  What can I do when I get the urge to smoke? To get through the urge to smoke, try the following:  Review your reasons for quitting and think of all the benefits to your health, your finances, and your family.  Remind yourself that there is no such thing as just one cigarette -- or even one puff.  Ride out the desire to smoke. Use the 4 Os -- Delay, Deep  Breaths, Drink Water  and Divert to get you through. The craving will go away eventually. Do not fool yourself into thinking you can have just one cigarette.  Any tips on how to deal with stress? Stress is a natural part of life. The key is to deal with it without reaching for a cigarette. Taking deep breaths, counting backwards from 10 and asking yourself 1-how big a deal is this?"  Writing down your feelings, talking with a friend and doing things like positive self-talk and meditation are some other ways that people deal with daily stress.  What if I start smoking again? Slips happen. Most people try to quit smoking a few times before they are successful. Don't beat yourself up if this happens to you! Ask yourself if this was a slip or a relapse. A slip is a one-time mistake that is quickly corrected. A relapse is going back to your old smoking habits.   If you slip, don't give up. Think of it as a learning experience. Ask yourself what went wrong and renew your commitment to staying away from smoking for good.  If you relapse, try not to get discouraged. Ask yourself the question "What caused me to start smoking?" Figure out what helped you and what didn't when you tried to quit. Knowing why you relapsed is useful information for your next attempt to quit.  Please bring all medications to your clinic visits.  Please arrive 10-15 minutes prior to your scheduled visit time.

## 2024-05-28 NOTE — Progress Notes (Signed)
   S:   Chief Complaint  Patient presents with   Medication Management    Wegovy  (semaglutide ) and weight loss   58 y.o. female who presents for evaluation/assistance with tobacco dependence. Patient is in good spirits.   Patient was referred and last seen by Primary Care Provider, Dr. Theophilus, on 05/13/2024.  At last visit, patient was making progress on tobacco intake reduction.    Number of cigarettes/day currently reported as 10 per day.  Reports smoking first-thing in the AM.     Medications used for cessation efforts include: bupropion , varenicline    Rates IMPORTANCE of quitting tobacco on 1-10 scale of 10. Rates CONFIDENCE of quitting tobacco on 1-10 scale of 8/10 by end of the year - 3 months.   Most common triggers to use tobacco include; First AM and STRESS   Motivation to quit: health  O: Clinical ASCVD: Yes   Review of Systems  Musculoskeletal:  Positive for joint pain.  All other systems reviewed and are negative.   Physical Exam Vitals reviewed.  Constitutional:      Appearance: Normal appearance.  Pulmonary:     Effort: Pulmonary effort is normal.  Neurological:     Mental Status: She is alert.  Psychiatric:        Mood and Affect: Mood normal.        Behavior: Behavior normal.        Thought Content: Thought content normal.        Judgment: Judgment normal.    Patient is participating in a Managed Medicaid Plan:  Yes Secondary  A/P: Tobacco use disorder with moderate nicotine  dependence with 71 pack years of exposure in a patient who is fair-good candidate for success because of current level of motivation and positive outlook on success.  Currently reports smoking ~10 cigs per day.   -Continued bupropion  XL 150 mg daily. Counseled on purpose, proper use, and potential adverse effects.  -Continued varenicline  1 mg by mouth twice daily with food. Counseled on purpose, proper use, and potential adverse effects. - Agreed on plan to reduce from 10  to 5 or fewer cigarettes per day over the next four weeks.   Weight-loss is currently uncontrolled BMI 50. Patient reports tolerating Wegovy  (semaglutide ) and feels like she has lost weight.  She confirms that she has been diagnosed with CAD in 2020. -Continued Wegovy  (semaglutide ) 0.25 mg weekly with final week of Sample.  We discussed new coverage criteria and we will pursue coverage with Wegovy  (semaglutide ) at 0.5mg  weekly.   -Counseled patient on continuing diet changes such as controlling portion sizes of carbs (rice), increasing leafy greens, and switching from sugary soda to diet or zero-sugar. -Counseled patient on continuing chair exercises and increasing physical activity duration and frequency. Patient is NOT interested in using exposed needle injection strategy for use of GLP (prefers autoinjection devices).  Written patient instructions provided. Patient verbalized understanding of treatment plan.  Total time in face to face counseling 23 minutes.    Follow-up:  Pharmacist 4 weeks (sooner if Weight loss therapy is rejected by insurance next week).  PCP clinic visit TBD Patient seen with Lawson Mao, PharmD Candidate - PY3 student.

## 2024-05-28 NOTE — Assessment & Plan Note (Signed)
 Tobacco use disorder with moderate nicotine  dependence with 71 pack years of exposure in a patient who is fair-good candidate for success because of current level of motivation and positive outlook on success.  Currently reports smoking ~10 cigs per day.   -Continued bupropion  XL 150 mg daily. Counseled on purpose, proper use, and potential adverse effects.  -Continued varenicline  1 mg by mouth twice daily with food. Counseled on purpose, proper use, and potential adverse effects. - Agreed on plan to reduce from 10 to 5 or fewer cigarettes per day over the next four weeks.

## 2024-06-01 NOTE — Progress Notes (Signed)
 Reviewed and agree with Dr Rennis plan.

## 2024-06-10 ENCOUNTER — Telehealth: Payer: Self-pay | Admitting: Family Medicine

## 2024-06-10 NOTE — Telephone Encounter (Signed)
 LVM for patient to schedule 3 month f/u in December with PCP.

## 2024-06-11 ENCOUNTER — Telehealth: Payer: Self-pay | Admitting: Pharmacist

## 2024-06-11 NOTE — Telephone Encounter (Signed)
 Patient contacts office to report not able to get Wegovy  (semaglutide ) - denied - not covered by Medicare card.  Patient unsure if Medicaid was used.   Patient leaves Voice Mail requesting call back.   Returned call and discussed lack of coverage for weight loss agents for medicare and limited for Medicaid changes as of 05/27/24  Patient does NOT desire use of pen needle tips - autoinjector acceptable.  Possible Mounjaro  (tirzepatide ) candidate at next visit 10/30  Total time with patient call and documentation of interaction: 12 minutes.

## 2024-06-12 NOTE — Telephone Encounter (Signed)
 Reviewed and agree with Dr Rennis plan.

## 2024-06-25 ENCOUNTER — Ambulatory Visit: Admitting: Pharmacist

## 2024-07-02 NOTE — Progress Notes (Signed)
    SUBJECTIVE:   CHIEF COMPLAINT / HPI:   Discussed the use of AI scribe software for clinical note transcription with the patient, who gave verbal consent to proceed.  Back/hip pain radiating down left leg - Pain originates in the back, radiates to the hip bone and on the outside of the left leg - Ibuprofen  and muscle relaxer provide incomplete relief - Has previously had imaging and trialed physical therapy years ago but continues to have symptoms - Working with Dr. Koval to attempt to lose weight, has been unsuccessful thus far  Bilateral leg numbness and tingling - Numbness and tingling in both feet and legs - Weakness when standing, particularly in the back of the hip bones extending down the legs - Denies abdominal pain, dysuria, urinary frequency or changes in bowel habit  Hyperparathyroidism - Completed a 12-week course of high-dose vitamin D  supplementation for low vitamin D  and high parathyroid  hormone levels      PERTINENT  PMH / PSH: Primary hyperparathyroidism, GERD, tobacco use, HLD, chronic low back pain  OBJECTIVE:   BP 112/82   Pulse 80   Ht 5' 4 (1.626 m)   Wt 295 lb 4 oz (133.9 kg)   LMP 02/21/2015   SpO2 100%   BMI 50.68 kg/m   General: NAD, pleasant, able to participate in exam Cardiac: RRR, no murmurs. Respiratory: CTAB, normal effort, No wheezes, rales or rhonchi Abdomen: Bowel sounds present, nontender, nondistended MSK: Back/L leg: -TTP over lower thoracic and upper lumbar spinous process extending around to left paraspinal musculature and down left lateral leg -Some stiffness with limited ROM. -Positive straight leg raise bilaterally Extremities: no edema or cyanosis. Skin: warm and dry, no rashes noted Neuro: alert, no obvious focal deficits Psych: Normal affect and mood  ASSESSMENT/PLAN:   Assessment & Plan Hyperparathyroidism Previously vitamin D  deficient, completed high-dose supplementation x 12 weeks.  Will repeat blood work but  if PTH persistently elevated with normal vitamin D  consideration for imaging giving prior primary hyperparathyroidism -PTH, calcium , vitamin D  Bilateral leg numbness Chronic left-sided low back pain with bilateral sciatica Previously seen by neurology and diagnosed with left-sided sciatica, recommended gabapentin  and PT.  Patient like to return to neurology given persistent symptoms, referral placed.  Also agreeable to imaging and retrial PT. -Thoracic and lumbar XR -Referral to neurology -Referral for physical therapy Encounter for immunization Received COVID-vaccine    Dr. Izetta Nap, DO  [facility]

## 2024-07-03 ENCOUNTER — Other Ambulatory Visit: Payer: Self-pay | Admitting: Orthopaedic Surgery

## 2024-07-03 ENCOUNTER — Encounter: Payer: Self-pay | Admitting: Pharmacist

## 2024-07-03 ENCOUNTER — Ambulatory Visit: Admitting: Pharmacist

## 2024-07-03 ENCOUNTER — Encounter: Payer: Self-pay | Admitting: Family Medicine

## 2024-07-03 ENCOUNTER — Ambulatory Visit: Admitting: Family Medicine

## 2024-07-03 VITALS — BP 112/82 | HR 80 | Ht 64.0 in | Wt 295.2 lb

## 2024-07-03 VITALS — BP 112/82

## 2024-07-03 DIAGNOSIS — E213 Hyperparathyroidism, unspecified: Secondary | ICD-10-CM | POA: Diagnosis not present

## 2024-07-03 DIAGNOSIS — Z23 Encounter for immunization: Secondary | ICD-10-CM | POA: Diagnosis not present

## 2024-07-03 DIAGNOSIS — M5442 Lumbago with sciatica, left side: Secondary | ICD-10-CM

## 2024-07-03 DIAGNOSIS — R635 Abnormal weight gain: Secondary | ICD-10-CM

## 2024-07-03 DIAGNOSIS — Z1231 Encounter for screening mammogram for malignant neoplasm of breast: Secondary | ICD-10-CM

## 2024-07-03 DIAGNOSIS — M5441 Lumbago with sciatica, right side: Secondary | ICD-10-CM | POA: Diagnosis not present

## 2024-07-03 DIAGNOSIS — G8929 Other chronic pain: Secondary | ICD-10-CM

## 2024-07-03 DIAGNOSIS — R200 Anesthesia of skin: Secondary | ICD-10-CM | POA: Diagnosis not present

## 2024-07-03 DIAGNOSIS — F17200 Nicotine dependence, unspecified, uncomplicated: Secondary | ICD-10-CM | POA: Diagnosis not present

## 2024-07-03 MED ORDER — MOUNJARO 2.5 MG/0.5ML ~~LOC~~ SOAJ: 2.5000 mg | SUBCUTANEOUS | Status: DC

## 2024-07-03 NOTE — Assessment & Plan Note (Addendum)
 Weight-loss is currently uncontrolled BMI 50.68. Patient reports previous success with Ozempic  (semaglutide ) and Wegovy  (semaglutide ) in the past, but stopped using due to lack of coverage with changing insurances.  -Started Mounjaro  (tirzepatide ) 2.5 mg with sample medication given today with counseling and first dose given today as well -Continue all other medications the same.  -Counseled patient on continuing diet changes such as controlling portion sizes of carbs (rice), increasing leafy greens, and switching from sugary soda to diet or zero-sugar. -Counseled patient on continuing chair exercises and increasing physical activity duration and frequency.

## 2024-07-03 NOTE — Assessment & Plan Note (Signed)
 Previously seen by neurology and diagnosed with left-sided sciatica, recommended gabapentin  and PT.  Patient like to return to neurology given persistent symptoms, referral placed.  Also agreeable to imaging and retrial PT. -Thoracic and lumbar XR -Referral to neurology -Referral for physical therapy

## 2024-07-03 NOTE — Assessment & Plan Note (Signed)
 Tobacco use disorder with moderate nicotine  dependence of ~71 pack years duration in a patient who is fair candidate for success. Patient reports she is currently smoking 10 cigarettes per day.  -Continued bupropion  XL 150 mg daily. Patient with no past medical history of seizures. Patient counseled on purpose, proper use, and potential adverse effects.  -Continued varenicline  1 mg by mouth twice daily with food. Patient counseled on purpose, proper use, and potential adverse effects. -Encouraged to try to get to 6-7 cigarettes a day by next visit. Reports that she rates confidence of 8 out of 10 of getting to this goal within the next month. -Provided information on 1 800-QUIT NOW support program.

## 2024-07-03 NOTE — Progress Notes (Signed)
 Reviewed and agree with Dr Rennis plan.

## 2024-07-03 NOTE — Progress Notes (Signed)
 S:   Chief Complaint  Patient presents with   Medication Management    Smoking cessation and Weight   58 y.o. female who presents for evaluation/assistance with tobacco dependence and weight loss. Patient is in good spirits.   Patient was referred and last seen by Primary Care Provider, Dr. Theophilus today and was referred for smoking cessation and weight management  PMH is significant for tobacco abuse, hyperlipidemia, hyperparathyroidism.   O: Clinical ASCVD: No  The 10-year ASCVD risk score (Arnett DK, et al., 2019) is: 7.9%   Values used to calculate the score:     Age: 53 years     Clincally relevant sex: Female     Is Non-Hispanic African American: Yes     Diabetic: No     Tobacco smoker: Yes     Systolic Blood Pressure: 112 mmHg     Is BP treated: Yes     HDL Cholesterol: 51 mg/dL     Total Cholesterol: 208 mg/dL  Review of Systems  All other systems reviewed and are negative.   Physical Exam Vitals reviewed.  Constitutional:      Appearance: Normal appearance.  Pulmonary:     Effort: Pulmonary effort is normal.  Neurological:     Mental Status: She is alert.  Psychiatric:        Mood and Affect: Mood normal.        Behavior: Behavior normal.        Thought Content: Thought content normal.        Judgment: Judgment normal.    A/P: Tobacco use disorder with moderate nicotine  dependence of ~71 pack years duration in a patient who is fair candidate for success. Patient reports she is currently smoking 10 cigarettes per day.  -Continued bupropion  XL 150 mg daily. Patient with no past medical history of seizures. Patient counseled on purpose, proper use, and potential adverse effects.  -Continued varenicline  1 mg by mouth twice daily with food. Patient counseled on purpose, proper use, and potential adverse effects. -Encouraged to try to get to 6-7 cigarettes a day by next visit. Reports that she rates confidence of 8 out of 10 of getting to this goal within  the next month. -Provided information on 1 800-QUIT NOW support program.    Weight-loss is currently uncontrolled BMI 50.68. Patient reports previous success with Ozempic  (semaglutide ) and Wegovy  (semaglutide ) in the past, but stopped using due to lack of coverage with changing insurances.  -Started Mounjaro  (tirzepatide ) 2.5 mg with sample medication given today with counseling and first dose given today as well -Continue all other medications the same.  -Counseled patient on continuing diet changes such as controlling portion sizes of carbs (rice), increasing leafy greens, and switching from sugary soda to diet or zero-sugar. -Counseled patient on continuing chair exercises and increasing physical activity duration and frequency.  Medication Samples have been provided to the patient.  Drug name: Mounjaro  (tirzepatide )     Strength: 2.5 mg        Qty: 1 LOT: [id number]  Exp.Date: 09/28/25  The patient has been instructed regarding the correct time, dose, and frequency of taking this medication, including desired effects and most common side effects.   Kendra [name] 11:32 AM 07/03/2024  Written patient instructions provided. Patient verbalized understanding of treatment plan.  Total time in face to face counseling 22 minutes.    Follow-up:  Pharmacist 08/03/24 Patient seen with Lawson Mao, PharmD Candidate - PY3 student and [employer] PharmD - PY4 Candidate.

## 2024-07-03 NOTE — Patient Instructions (Signed)
 It was wonderful to see you today! Thank you for choosing [facility].   Please bring ALL of your medications with you to every visit.   Today we talked about:  For your thoracic and low back pain I am referring you for x-rays and to get physical therapy done.  Ultimately I think you may have some arthritis in your back given arthritis in other locations and may need injections in your back.  I think we need to retry the therapy though and see if it will help your symptoms.  Please continue to take Tylenol  as needed and use lidocaine  patches for relief. I also placed the referral to neurology for the numbness and weakness in your legs for the specified person you would like.  Our office will follow-up with the referral information but you can also call and schedule at your convenience. We are rechecking some of your blood work today to look your vitamin D  and parathyroid  hormone levels, I will follow-up with you regarding those results.  Please follow up in 1 months   If you haven't already, sign up for My Chart to have easy access to your labs results, and communication with your primary care physician.   We are checking some labs today. If they are abnormal, I will call you. If they are normal, I will send you a MyChart message (if it is active) or a letter in the mail. If you do not hear about your labs in the next 2 weeks, please call the office.  Call the clinic at [phone] if your symptoms worsen or you have any concerns.  Please be sure to schedule follow up at the front desk before you leave today.   Izetta Nap, DO Family Medicine

## 2024-07-03 NOTE — Patient Instructions (Signed)
 It was nice to see you today!  Medication Changes: START Mounjaro  (tirzepatide ) 2.5 mg weekly Discontinue Wegovy  (semaglutide )   Continue all other medication the same.   Monitor blood sugars at home and keep a log (glucometer or piece of paper) to bring with you to your next visit.  Keep up the good work with diet and exercise. Aim for a diet full of vegetables, fruit and lean meats (chicken, turkey, fish). Try to limit salt intake by eating fresh or frozen vegetables (instead of canned), rinse canned vegetables prior to cooking and do not add any additional salt to meals.

## 2024-07-04 LAB — PTH, INTACT AND CALCIUM
Calcium: 10.1 mg/dL (ref 8.7–10.2)
PTH: 103 pg/mL — ABNORMAL HIGH (ref 15–65)

## 2024-07-04 LAB — VITAMIN D 25 HYDROXY (VIT D DEFICIENCY, FRACTURES): Vit D, 25-Hydroxy: 20.8 ng/mL — ABNORMAL LOW (ref 30.0–100.0)

## 2024-07-08 ENCOUNTER — Ambulatory Visit: Payer: Self-pay | Admitting: Family Medicine

## 2024-07-08 DIAGNOSIS — E559 Vitamin D deficiency, unspecified: Secondary | ICD-10-CM

## 2024-07-08 DIAGNOSIS — E213 Hyperparathyroidism, unspecified: Secondary | ICD-10-CM

## 2024-07-08 MED ORDER — VITAMIN D (ERGOCALCIFEROL) 1.25 MG (50000 UNIT) PO CAPS: 50000.0000 [IU] | ORAL_CAPSULE | ORAL | 0 refills | Status: DC

## 2024-07-08 NOTE — Telephone Encounter (Signed)
 Spoke with patient regarding PTH, calcium  and vitamin D  result.  Continues to have elevated PTH, given persistent vitamin D  deficiency still likely secondary hyperparathyroidism at this time.  Will give high-dose vitamin D  supplementation x 12 weeks and follow-up for repeat testing after that time.  If vitamin D  normalized and persistently elevated PTH, will need evaluation for recurrence of primary hyperparathyroidism with further imaging.  Izetta Nap, DO

## 2024-07-21 ENCOUNTER — Ambulatory Visit: Admitting: Podiatry

## 2024-07-28 ENCOUNTER — Ambulatory Visit

## 2024-08-03 ENCOUNTER — Ambulatory Visit: Admitting: Pharmacist

## 2024-08-10 ENCOUNTER — Telehealth: Payer: Self-pay | Admitting: Pharmacist

## 2024-08-10 NOTE — Telephone Encounter (Signed)
 Attempted to contact patient for follow-up of request to reschedule    Left HIPAA compliant voice mail requesting call back to direct phone: [phone]  Total time with patient call and documentation of interaction: 4 minutes.

## 2024-08-19 ENCOUNTER — Ambulatory Visit: Admitting: Orthopaedic Surgery

## 2024-09-03 ENCOUNTER — Ambulatory Visit
Admission: RE | Admit: 2024-09-03 | Discharge: 2024-09-03 | Disposition: A | Discharge status: Home / Self Care | Source: Ambulatory Visit | Attending: Orthopaedic Surgery | Admitting: Orthopaedic Surgery

## 2024-09-03 DIAGNOSIS — Z1231 Encounter for screening mammogram for malignant neoplasm of breast: Secondary | ICD-10-CM

## 2024-09-10 ENCOUNTER — Ambulatory Visit

## 2024-09-10 ENCOUNTER — Telehealth: Payer: Self-pay

## 2024-09-10 VITALS — BP 118/60 | HR 79 | Temp 97.6°F | Resp 16 | Ht 64.0 in | Wt 292.8 lb

## 2024-09-10 DIAGNOSIS — Z0000 Encounter for general adult medical examination without abnormal findings: Secondary | ICD-10-CM

## 2024-09-10 NOTE — Telephone Encounter (Signed)
 Patient stated during AWV, that she has been waiting since November regarding a referral to [facility] due to severe knee pain and back pain.  Patient stated she has not received any calls or information.  Can someone please check on this for this patient, she is in severe pain and is in need of help with her knees and back.  Maddyx Wieck N. Tomie, LPN [facility] Annual Wellness Team Direct Dial: [phone]

## 2024-09-10 NOTE — Progress Notes (Signed)
 "  Chief Complaint  Patient presents with   Medicare Wellness    Subsequent     Subjective:   Kendra [name] is a 59 y.o. female who presents for a Medicare Annual Wellness Visit.  Visit info / Clinical Intake: Medicare Wellness Visit Type:: Subsequent Annual Wellness Visit Persons participating in visit and providing information:: patient Medicare Wellness Visit Mode:: In-person (required for WTM) Interpreter Needed?: No Pre-visit prep was completed: yes AWV questionnaire completed by patient prior to visit?: no Living arrangements:: with family/others (Mother lives with her) Patient's Overall Health Status Rating: (!) poor Typical amount of pain: (!) a lot Does pain affect daily life?: (!) yes Are you currently prescribed opioids?: no  Dietary Habits and Nutritional Risks How many meals a day?: 2 (1-2 meals) Eats fruit and vegetables daily?: yes Most meals are obtained by: preparing own meals In the last 2 weeks, have you had any of the following?: none Diabetic:: no  Functional Status Activities of Daily Living (to include ambulation/medication): Independent Ambulation: Independent Medication Administration: Independent Home Management (perform basic housework or laundry): Independent Manage your own finances?: yes Primary transportation is: facility / other Concerns about vision?: no *vision screening is required for WTM* Concerns about hearing?: no  Fall Screening Falls in the past year?: 0 Number of falls in past year: 0 Was there an injury with Fall?: 0 Fall Risk Category Calculator: 0 Patient Fall Risk Level: Low Fall Risk  Fall Risk Patient at Risk for Falls Due to: No Fall Risks Fall risk Follow up: Falls evaluation completed; Education provided  Home and Transportation Safety: All rugs have non-skid backing?: N/A, no rugs All stairs or steps have railings?: N/A, no stairs Grab bars in the bathtub or shower?: yes Have non-skid surface in bathtub or  shower?: (!) no Good home lighting?: yes Regular seat belt use?: yes Hospital stays in the last year:: no  Cognitive Assessment Difficulty concentrating, remembering, or making decisions? : no (BSE: Reading, puzzles, games on the phone) Will 6CIT or Mini Cog be Completed: yes What year is it?: 0 points What month is it?: 0 points Give patient an address phrase to remember (5 components): [address] [place] About what time is it?: 0 points Count backwards from 20 to 1: 0 points Say the months of the year in reverse: 0 points Repeat the address phrase from earlier: 0 points 6 CIT Score: 0 points  Advance Directives (For Healthcare) Does Patient Have a Medical Advance Directive?: No Would patient like information on creating a medical advance directive?: No - Patient declined  Reviewed/Updated  Reviewed/Updated: Reviewed All (Medical, Surgical, Family, Medications, Allergies, Care Teams, Patient Goals)    Allergies (verified) Lisinopril    Current Medications (verified) Outpatient Encounter Medications as of 09/10/2024  Medication Sig   acetaminophen  (TYLENOL  8 HOUR) 650 MG CR tablet Take 1 tablet (650 mg total) by mouth every 8 (eight) hours as needed for pain.   albuterol  (VENTOLIN  HFA) 108 (90 Base) MCG/ACT inhaler INHALE 2 PUFFS BY MOUTH EVERY 4 HOURS AS NEEDED FOR WHEEZING OR SHORTNESS OF BREATH   alum & mag hydroxide-simeth (MAALOX/MYLANTA) 200-200-20 MG/5ML suspension Take 30 mLs by mouth every 6 (six) hours as needed for indigestion or heartburn. Reported on 10/26/2015 (Patient not taking: Reported on 07/03/2024)   aspirin  EC 81 MG tablet Take 1 tablet (81 mg total) by mouth daily. Swallow whole.   budesonide -formoterol  (SYMBICORT ) 80-4.5 MCG/ACT inhaler Inhale 2 puffs into the lungs 2 (two) times daily.  buPROPion  (WELLBUTRIN  XL) 300 MG 24 hr tablet Take 1 tablet (300 mg total) by mouth daily.   furosemide  (LASIX ) 40 MG tablet Take 1 tablet (40 mg total) by mouth  daily.   gabapentin  (NEURONTIN ) 300 MG capsule TAKE 1 CAPSULE BY MOUTH ONCE DURING THE DAYTIME AND ANOTHER AT BEDTIME   ibuprofen  (ADVIL ) 800 MG tablet Take 1 tablet (800 mg total) by mouth every 6 (six) hours as needed.   Multiple Minerals-Vitamins (CITRACAL MAXIMUM PLUS) TABS Take 1 tablet by mouth in the morning and at bedtime.   naltrexone  (DEPADE) 50 MG tablet Take 0.5 tablets (25 mg total) by mouth daily.   pantoprazole  (PROTONIX ) 40 MG tablet TAKE 1 TABLET(40 MG) BY MOUTH TWICE DAILY BEFORE A MEAL   REPATHA  SURECLICK 140 MG/ML SOAJ ADMINISTER 1 ML UNDER THE SKIN EVERY 14 DAYS   rosuvastatin  (CRESTOR ) 40 MG tablet Take 1 tablet (40 mg total) by mouth daily.   tirzepatide  (MOUNJARO ) 2.5 MG/0.5ML Pen Inject 2.5 mg into the skin once a week.   varenicline  (CHANTIX ) 1 MG tablet Take 1 tablet (1 mg total) by mouth 2 (two) times daily with a meal.   Vitamin D , Ergocalciferol , (DRISDOL ) 1.25 MG (50000 UNIT) CAPS capsule Take 1 capsule (50,000 Units total) by mouth every 7 (seven) days for 12 doses.   No facility-administered encounter medications on file as of 09/10/2024.    History: Past Medical History:  Diagnosis Date   Anemia    Asthma    Cigarette smoker    Dysrhythmia    Tachycardia   Esophagitis    GERD (gastroesophageal reflux disease)    Hemangioma of liver    HTN (hypertension)    Hyperplastic colon polyp    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    Cor CTA 05/2019: Ca score 81.5 (96th percentile); mRCA < 25; dLM < 25; oLAD 25-49, pLAD < 25   Past Surgical History:  Procedure Laterality Date   ABDOMINAL HYSTERECTOMY  04/12/2015   ANKLE SURGERY Left    CESAREAN SECTION     CYSTOSCOPY N/A 04/12/2015   Procedure: CYSTOSCOPY;  Surgeon: Elveria Mungo, MD;  Location: [facility];  Service: Gynecology;  Laterality: N/A;   FINGER SURGERY Right    3rd and 4th fingers   PARATHYROIDECTOMY Right 01/10/2023   Procedure: RIGHT SUPERIOR PARATHYROIDECTOMY;  Surgeon: Eletha Boas, MD;  Location: [facility];  Service: General;  Laterality: Right;  90   TIBIA FRACTURE SURGERY Left    ankle ORIF-  hardware has been removed though   TOE OSTEOTOMY Left    5th toe   TUBAL LIGATION     Family History  Problem Relation Age of Onset   Diabetes Mother    Heart disease Mother    Lung cancer Father 81   Diabetes Sister    Breast cancer Neg Hx    Stomach cancer Neg Hx    Colon cancer Neg Hx    Pancreatic cancer Neg Hx    Social History   Occupational History   Occupation: disabled- retail  Tobacco Use   Smoking status: Every Day    Current packs/day: 3.00    Average packs/day: 3.0 packs/day for 24.0 years (72.1 ttl pk-yrs)    Types: Cigarettes    Start date: 08/27/2000   Smokeless tobacco: Never   Tobacco comments:    History of smoking 2-4 ppd from age 41 to age 66  Vaping Use   Vaping status: Never Used  Substance and Sexual Activity  Alcohol use: Yes    Alcohol/week: 0.0 standard drinks of alcohol    Comment: 2 -40oz daily,  24oz daily.     Drug use: No   Sexual activity: Not Currently    Partners: Male    Birth control/protection: None   Tobacco Counseling Ready to quit: Not Answered Counseling given: Not Answered Tobacco comments: History of smoking 2-4 ppd from age 14 to age 78  SDOH Screenings   Food Insecurity: No Food Insecurity (09/10/2024)  Housing: Low Risk (09/10/2024)  Transportation Needs: No Transportation Needs (09/10/2024)  Utilities: Not At Risk (09/10/2024)  Alcohol Screen: Low Risk (09/10/2024)  Depression (PHQ2-9): Low Risk (09/10/2024)  Financial Resource Strain: Low Risk (09/10/2024)  Physical Activity: Inactive (09/10/2024)  Social Connections: Moderately Isolated (09/10/2024)  Stress: No Stress Concern Present (09/10/2024)  Tobacco Use: High Risk (09/10/2024)  Health Literacy: Adequate Health Literacy (09/10/2024)   See flowsheets for full screening details  Depression Screen Depression Screening Exception  Documentation Depression Screening Exception:: Patient refusal  PHQ 2 & 9 Depression Scale- Over the past 2 weeks, how often have you been bothered by any of the following problems? Little interest or pleasure in doing things: 0 Feeling down, depressed, or hopeless (PHQ Adolescent also includes...irritable): 0 PHQ-2 Total Score: 0 Trouble falling or staying asleep, or sleeping too much: 0 Feeling tired or having little energy: 0 Poor appetite or overeating (PHQ Adolescent also includes...weight loss): 0 Feeling bad about yourself - or that you are a failure or have let yourself or your family down: 0 Trouble concentrating on things, such as reading the newspaper or watching television (PHQ Adolescent also includes...like school work): 0 Moving or speaking so slowly that other people could have noticed. Or the opposite - being so fidgety or restless that you have been moving around a lot more than usual: 0 Thoughts that you would be better off dead, or of hurting yourself in some way: 0 PHQ-9 Total Score: 0 If you checked off any problems, how difficult have these problems made it for you to do your work, take care of things at home, or get along with other people?: Not difficult at all  Depression Treatment Depression Interventions/Treatment : [id number] Score <4 Follow-up Not Indicated     Goals Addressed             This Visit's Progress    09/10/2024: Get back to normal by getting my knee surgery and getting some pain relief from my back pain.               Objective:    Today's Vitals   09/10/24 1401  BP: 118/60  Pulse: 79  Resp: 16  Temp: 97.6 F (36.4 C)  SpO2: 98%  Weight: 292 lb 12.8 oz (132.8 kg)  Height: 5' 4 (1.626 m)  PainSc: 8   PainLoc: Generalized   Body mass index is 50.26 kg/m.  Hearing/Vision screen Hearing Screening - Comments:: Patient wears reading glasses. Immunizations and Health Maintenance Health Maintenance  Topic Date Due   Hepatitis B  Vaccines 19-59 Average Risk (1 of 3 - 19+ 3-dose series) Never done   Lung Cancer Screening  07/10/2024   Medicare Annual Wellness (AWV)  09/10/2025   Colonoscopy  10/25/2025   Mammogram  09/03/2026   Cervical Cancer Screening (HPV/Pap Cotest)  05/18/2027   DTaP/Tdap/Td (3 - Td or Tdap) 03/02/2032   Pneumococcal Vaccine: 50+ Years  Completed   Influenza Vaccine  Completed   HPV VACCINES (No Doses Required)  Completed   COVID-19 Vaccine  Completed   Hepatitis C Screening  Completed   HIV Screening  Completed   Zoster Vaccines- Shingrix  Completed   Meningococcal B Vaccine  Aged Out        Assessment/Plan:  This is a routine wellness examination for [place].  Patient Care Team: Theophilus Pagan, MD as PCP - General (Family Medicine) Wonda Sharper, MD as PCP - Cardiology (Cardiology) Patel, Donika K, DO as Consulting Physician (Neurology) Lelon Glendia ONEIDA DEVONNA as Physician Assistant (Cardiology) [facility] Octavia Bruckner, MD as Consulting Physician (Ophthalmology)  I have personally reviewed and noted the following in the patients chart:   Medical and social history Use of alcohol, tobacco or illicit drugs  Current medications and supplements including opioid prescriptions. Functional ability and status Nutritional status Physical activity Advanced directives List of other physicians Hospitalizations, surgeries, and ER visits in previous 12 months Vitals Screenings to include cognitive, depression, and falls Referrals and appointments  No orders of the defined types were placed in this encounter.  In addition, I have reviewed and discussed with patient certain preventive protocols, quality metrics, and best practice recommendations. A written personalized care plan for preventive services as well as general preventive health recommendations were provided to patient.   Roz LOISE Fuller, LPN   8/84/7973   Return in about 1 year (around  09/10/2025) for Medicare wellness.  After Visit Summary: (In Person-Declined) Patient declined AVS at this time.  Nurse Notes: HM Addressed: Vaccines Due: Hep B Vaccines  Patient is due for Lung Cancer Screening  "

## 2024-09-10 NOTE — Patient Instructions (Signed)
 Kendra [name],  Thank you for taking the time for your Medicare Wellness Visit. I appreciate your continued commitment to your health goals. Please review the care plan we discussed, and feel free to reach out if I can assist you further.  Please note that Annual Wellness Visits do not include a physical exam. Some assessments may be limited, especially if the visit was conducted virtually. If needed, we may recommend an in-person follow-up with your provider.  Ongoing Care Seeing your primary care provider every 3 to 6 months helps us  monitor your health and provide consistent, personalized care.   Referrals If a referral was made during today's visit and you haven't received any updates within two weeks, please contact the referred provider directly to check on the status.  Recommended Screenings:  Health Maintenance  Topic Date Due   Hepatitis B Vaccine (1 of 3 - 19+ 3-dose series) Never done   Zoster (Shingles) Vaccine (1 of 2) Never done   Medicare Annual Wellness Visit  01/28/2024   Screening for Lung Cancer  07/10/2024   Colon Cancer Screening  10/25/2025   Breast Cancer Screening  09/03/2026   Pap with HPV screening  05/18/2027   DTaP/Tdap/Td vaccine (3 - Td or Tdap) 03/02/2032   Pneumococcal Vaccine for age over 21  Completed   Flu Shot  Completed   HPV Vaccine (No Doses Required) Completed   COVID-19 Vaccine  Completed   Hepatitis C Screening  Completed   HIV Screening  Completed   Meningitis B Vaccine  Aged Out       09/10/2024    2:13 PM  Advanced Directives  Does Patient Have a Medical Advance Directive? No  Would patient like information on creating a medical advance directive? No - Patient declined    Vision: Annual vision screenings are recommended for early detection of glaucoma, cataracts, and diabetic retinopathy. These exams can also reveal signs of chronic conditions such as diabetes and high blood pressure.  Dental: Annual dental screenings help detect  early signs of oral cancer, gum disease, and other conditions linked to overall health, including heart disease and diabetes.  Please see the attached documents for additional preventive care recommendations.

## 2024-09-14 ENCOUNTER — Ambulatory Visit: Admitting: Pharmacist

## 2024-09-14 NOTE — Progress Notes (Unsigned)
" ° ° °  SUBJECTIVE:   CHIEF COMPLAINT / HPI:   Discussed the use of AI scribe software for clinical note transcription with the patient, who gave verbal consent to proceed.  Vitamin d  deficiency and parathyroid  abnormalities - Completed multiple rounds of 12-week course of high-dose vitamin D  supplementation - Continues vitamin D  supplementation with three doses remaining  Lung cancer screening and tobacco use - Due for repeat lung cancer screening; last chest CT performed in 2024 - Currently smokes two packs per day, increased since July 03, 2025 following nephews death - Restarted on Chantix  for smoking cessation per Dr. Koval and following with pharmacy for cessation - No shortness of breath with daily activities   Peripheral edema and fluid retention - Chronic, reports worsening leg swelling due to fluid retention - No improvement with daily Lasix , leg elevation, or support stockings - Leg swelling does not improve with elevation or compression - Mobility limited by knee pain, trying to get into aquatic therapy  PERTINENT  PMH / PSH: Primary hyperparathyroidism, GERD, tobacco use, HLD, chronic low back pain   OBJECTIVE:   BP (!) 125/93   Pulse 87   Wt 291 lb 9.6 oz (132.3 kg)   LMP 02/21/2015   SpO2 99%   BMI 50.05 kg/m    General: NAD, pleasant, able to participate in exam HEENT: No thyromegaly noted.  Well-healed surgical scar. Cardiac: RRR, no murmurs. Respiratory: CTAB, normal effort, No wheezes, rales or rhonchi Extremities: Mild, nonpitting interstitial edema bilaterally Skin: warm and dry, no rashes noted Neuro: alert, no obvious focal deficits Psych: Normal affect and mood  ASSESSMENT/PLAN:   Assessment & Plan Tobacco use disorder Chronic obstructive pulmonary disease with emphysema, unspecified emphysema type (HCC) Increased smoking to two packs/day. Restarted on Chantix  for smoking cessation. Discussed the impact of stress on smoking habits and the importance  of setting small goals for reduction. - Continue Chantix  for smoking cessation. - Encouraged setting small goals for smoking reduction. - Ordered low-dose CT for lung cancer screening Hyperparathyroidism S/p multiple rounds of high-dose vitamin D  supplementation, close to normal levels with persistently elevated PTH.  Given prior parathyroid  adenoma, may warrant repeat imaging although NM scan in 01/2024 negative. -Vitamin D , PTH, calcium  Mixed hyperlipidemia Prior LDL 141 in 04/2024 after being off medication, currently on rosuvastatin  40 mg daily and Repatha  every 2 week. -Repeat LDL Lower extremity edema Nonpitting interstitial edema, likely secondary to venous insufficiency.  Prior echo in 02/2023 unremarkable.  Encouraged to use of compression stockings, low-salt diet and increase activity as tolerated, attempting to get patient into aquatic therapy.    Dr. Izetta Nap, DO [facility]     "

## 2024-09-15 ENCOUNTER — Ambulatory Visit: Admitting: Pharmacist

## 2024-09-15 ENCOUNTER — Ambulatory Visit: Payer: Self-pay | Admitting: Family Medicine

## 2024-09-15 ENCOUNTER — Encounter: Payer: Self-pay | Admitting: Family Medicine

## 2024-09-15 VITALS — BP 98/65 | HR 84 | Wt 291.6 lb

## 2024-09-15 VITALS — BP 125/93 | HR 87 | Wt 291.6 lb

## 2024-09-15 DIAGNOSIS — J439 Emphysema, unspecified: Secondary | ICD-10-CM | POA: Diagnosis not present

## 2024-09-15 DIAGNOSIS — F17200 Nicotine dependence, unspecified, uncomplicated: Secondary | ICD-10-CM | POA: Diagnosis not present

## 2024-09-15 DIAGNOSIS — E213 Hyperparathyroidism, unspecified: Secondary | ICD-10-CM | POA: Diagnosis not present

## 2024-09-15 DIAGNOSIS — R635 Abnormal weight gain: Secondary | ICD-10-CM

## 2024-09-15 DIAGNOSIS — R600 Localized edema: Secondary | ICD-10-CM | POA: Diagnosis not present

## 2024-09-15 DIAGNOSIS — Z6841 Body Mass Index (BMI) 40.0 and over, adult: Secondary | ICD-10-CM

## 2024-09-15 DIAGNOSIS — E782 Mixed hyperlipidemia: Secondary | ICD-10-CM | POA: Diagnosis not present

## 2024-09-15 MED ORDER — FUROSEMIDE 40 MG PO TABS: 40.0000 mg | ORAL_TABLET | Freq: Every day | ORAL | 0 refills | Status: AC | PRN

## 2024-09-15 MED ORDER — VARENICLINE TARTRATE 0.5 MG PO TABS: 0.5000 mg | ORAL_TABLET | Freq: Two times a day (BID) | ORAL | 1 refills | Status: AC

## 2024-09-15 MED ORDER — MOUNJARO 2.5 MG/0.5ML ~~LOC~~ SOAJ: 2.5000 mg | SUBCUTANEOUS | Status: AC

## 2024-09-15 NOTE — Assessment & Plan Note (Signed)
 Tobacco use disorder with moderate nicotine  dependence of 71 pack years duration in a patient who is fair candidate for success because of current level of commitment and significant progress.  Patient reports she is currently smoking 2 packs (40 cigarettes) per day.     - Goal to hopefully quit by the end of the year - short-term goal 30 or less cigarettes at next visit. - Initiated varenicline  0.5 mg by mouth once daily with food, then if tolerating then increase to twice daily. Patient counseled on purpose, proper use, and potential adverse effects, including GI upset. Consider increasing dose at next visit.

## 2024-09-15 NOTE — Assessment & Plan Note (Signed)
 Weight-loss is currently uncontrolled BMI 50.05. Patient reports previous success with Ozempic  (semaglutide ) and Wegovy  (semaglutide ) in the past, but stopped using due to lack of coverage with changing insurances.  - Restarted Mounjaro  (tirzepatide ) 2.5 mg weekly. Patient educated on purpose, proper use and potential adverse effects of nausea.  Following instruction patient verbalized understanding of treatment plan.  - Continue all other medications the same.  - Patient states goal weight is ~260 lbs.

## 2024-09-15 NOTE — Progress Notes (Signed)
 "  S:   Chief Complaint  Patient presents with   Medication Management    Tobacco Cessation   59 y.o. female who presents for evaluation/assistance with tobacco dependence. Patient is in good spirits.   Patient is also being seen by Primary Care Provider, Dr. Theophilus, today.  At last visit, with me, 2024/08/04 patient was making progress on tobacco intake reduction.    PMH is significant for tobacco abuse, hyperlipidemia, hyperparathyroidism.   Reports nephew passed away in 2025/08/04 and due to the stress she has increased her smoking to 2 packs/day. Reports not taking Mounjaro  (tirzepatide ) since early Aug 04, 2025.    Brand smoked [place]. Number of cigarettes/day 40.  Estimated nicotine  content per cigarette (mg) >1.  Estimated nicotine  intake per day >40mg .    Medications used in past cessation efforts include: NRT, bupropion , varenicline   Rates CONFIDENCE of reducing to 5 cigarettes/day on 1-10 scale of 10.  Most common triggers to use tobacco include; recent death of her nephew    O: Clinical ASCVD: No  The 10-year ASCVD risk score (Arnett DK, et al., 2019) is: 15.4%   Values used to calculate the score:     Age: 59 years     Clinically relevant sex: Female     Is Non-Hispanic African American: Yes     Diabetic: No     Tobacco smoker: Yes     Systolic Blood Pressure: 139 mmHg     Is BP treated: Yes     HDL Cholesterol: 51 mg/dL     Total Cholesterol: 208 mg/dL  Review of Systems  Musculoskeletal:  Positive for joint pain (bilateral knees).  All other systems reviewed and are negative.   Physical Exam Vitals reviewed.  Constitutional:      Appearance: Normal appearance.  Pulmonary:     Effort: Pulmonary effort is normal.  Neurological:     Mental Status: She is alert.  Psychiatric:        Behavior: Behavior normal.        Thought Content: Thought content normal.        Judgment: Judgment normal.     Patient is participating in a Managed Medicaid Plan:   Yes   A/P: Tobacco use disorder with moderate nicotine  dependence of 71 pack years duration in a patient who is fair candidate for success because of current level of commitment and significant progress.  Patient reports she is currently smoking 2 packs (40 cigarettes) per day.     - Goal to hopefully quit by the end of the year - short-term goal 30 or less cigarettes at next visit. - Initiated varenicline  0.5 mg by mouth once daily with food, then if tolerating then increase to twice daily. Patient counseled on purpose, proper use, and potential adverse effects, including GI upset. Consider increasing dose at next visit.    Weight-loss is currently uncontrolled BMI 50.05. Patient reports previous success with Ozempic  (semaglutide ) and Wegovy  (semaglutide ) in the past, but stopped using due to lack of coverage with changing insurances.  - Restarted Mounjaro  (tirzepatide ) 2.5 mg weekly. Patient educated on purpose, proper use and potential adverse effects of nausea.  Following instruction patient verbalized understanding of treatment plan.  - Continue all other medications the same.  - Patient states goal weight is ~260 lbs.    Written patient instructions provided. Patient verbalized understanding of treatment plan.  Total time in face to face counseling 29 minutes.    Follow-up:  Pharmacist 1 month PCP clinic visit - today  Patient seen with Sabra Schuller, PharmD Candidate - PY2 student and Megan McGill, PharmD Candidate - PY4 student.    "

## 2024-09-15 NOTE — Patient Instructions (Signed)
 Nice to see you today!   Medication Changes: - START Chantix  (varenicline ) 0.5 mg once daily, then increase to twice daily if tolerated.   - Re-START Mounjaro  (tirzepatide ) 2.5 mg weekly.   - Continue all other medication the same.   Tobacco Patient Instructions  Quitting smoking is one of the most important decisions you can make for your current and future health. Consider what you dislike about smoking and how quitting could personally benefit you. Try to cut down.   Aim for reducing the amount you smoke by 5 cigarettes over the next 4 weeks.  My target quit date is: the end of the year   Starting today, Be a Quitter!  Remind yourself why you want to quit.  Delay your first cigarette of the day for as long as possible.  Start cleaning out all pockets, drawers, and your car of cigarettes.  Getting Through the Cravings Once You Are Smoke Free: Each craving will last about 10 minutes, whether or not you smoke. Here's how to get through the cravings without cigarettes:  DELAY: Tell yourself that you'll wait for the next craving. Do it every time! DEEP BREATHS: One reason smoking feels good is because you breathe in deeply to inhale. Take four slow, deep breaths and feel the relaxation without the hamful effects of cigarettes. DRINK WATER : Drink a glass of cool water . It will give your hands and mouth something to do and will help flush the nicotine  out of your system faster. DIVERT: Do something else -- brush your teeth, take a walk, call a friend who can offer you support. Just moving onto something other than thinking about cigarettes will move you through the craving.   Frequently Asked Questions  What can I do when I get the urge to smoke? To get through the urge to smoke, try the following:  Review your reasons for quitting and think of all the benefits to your health, your finances, and your family.  Remind yourself that there is no such thing as just one cigarette -- or  even one puff.  Ride out the desire to smoke. Use the 4 Os -- Delay, Deep Breaths, Drink Water  and Divert to get you through. The craving will go away eventually. Do not fool yourself into thinking you can have just one cigarette.  Any tips on how to deal with stress? Stress is a natural part of life. The key is to deal with it without reaching for a cigarette. Taking deep breaths, counting backwards from 10 and asking yourself 1-how big a deal is this?  Writing down your feelings, talking with a friend and doing things like positive self-talk and meditation are some other ways that people deal with daily stress.  What if I start smoking again? Slips happen. Most people try to quit smoking a few times before they are successful. Don't beat yourself up if this happens to you! Ask yourself if this was a slip or a relapse. A slip is a one-time mistake that is quickly corrected. A relapse is going back to your old smoking habits.   If you slip, don't give up. Think of it as a learning experience. Ask yourself what went wrong and renew your commitment to staying away from smoking for good.  If you relapse, try not to get discouraged. Ask yourself the question What caused me to start smoking? Figure out what helped you and what didn't when you tried to quit. Knowing why you relapsed is useful information for  your next attempt to quit.  Please bring all medications to your clinic visits.  Please arrive 10-15 minutes prior to your scheduled visit time.

## 2024-09-15 NOTE — Patient Instructions (Signed)
 It was wonderful to see you today! Thank you for choosing [facility].   Please bring ALL of your medications with you to every visit.   Today we talked about:  I ordered a lung cancer screening to be done, our office will run it through insurance and then help you get it scheduled.  Please follow-up with what Dr. Ruthann said regarding your smoking cessation.  Set small goals like cutting back to 1-1/2 packs/day and continue to take the Chantix  as prescribed.  So sorry for the loss of your nephew, if you have any concerns about grief it is impacting your everyday life things let me know. We will check on some blood work today to look at your cholesterol, vitamin D  and parathyroid  hormone levels.  I will follow-up with you regarding those results if any changes are needed to your medications. For your lower leg swelling, I do think elevating your legs and using good compression wraps in addition to low-salt diet and help with the swelling.  You can continue to use Lasix  as needed for significant swelling but if your symptoms are worsening please return to the clinic to discuss further.  Please follow up in 3 months   If you haven't already, sign up for My Chart to have easy access to your labs results, and communication with your primary care physician.   We are checking some labs today. If they are abnormal, I will call you. If they are normal, I will send you a MyChart message (if it is active) or a letter in the mail. If you do not hear about your labs in the next 2 weeks, please call the office.  Call the clinic at [phone] if your symptoms worsen or you have any concerns.  Please be sure to schedule follow up at the front desk before you leave today.   Izetta Nap, DO Family Medicine

## 2024-09-16 ENCOUNTER — Ambulatory Visit: Admitting: Orthopaedic Surgery

## 2024-09-16 LAB — PTH, INTACT AND CALCIUM
Calcium: 9.8 mg/dL (ref 8.7–10.2)
PTH: 107 pg/mL — ABNORMAL HIGH (ref 15–65)

## 2024-09-16 LAB — LDL CHOLESTEROL, DIRECT: LDL Direct: 80 mg/dL (ref 0–99)

## 2024-09-16 LAB — VITAMIN D 25 HYDROXY (VIT D DEFICIENCY, FRACTURES): Vit D, 25-Hydroxy: 23 ng/mL — ABNORMAL LOW (ref 30.0–100.0)

## 2024-09-16 LAB — TSH: TSH: 1.03 u[IU]/mL (ref 0.450–4.500)

## 2024-09-16 NOTE — Progress Notes (Signed)
 Reviewed and agree with Dr Rennis plan.

## 2024-09-17 ENCOUNTER — Ambulatory Visit: Payer: Self-pay | Admitting: Family Medicine

## 2024-09-17 DIAGNOSIS — E559 Vitamin D deficiency, unspecified: Secondary | ICD-10-CM

## 2024-09-17 DIAGNOSIS — E213 Hyperparathyroidism, unspecified: Secondary | ICD-10-CM

## 2024-09-17 MED ORDER — VITAMIN D (ERGOCALCIFEROL) 1.25 MG (50000 UNIT) PO CAPS: 50000.0000 [IU] | ORAL_CAPSULE | ORAL | 0 refills | Status: AC

## 2024-09-17 NOTE — Assessment & Plan Note (Signed)
 Prior LDL 141 in 04/2024 after being off medication, currently on rosuvastatin  40 mg daily and Repatha  every 2 week. -Repeat LDL

## 2024-09-17 NOTE — Assessment & Plan Note (Signed)
 Increased smoking to two packs/day. Restarted on Chantix  for smoking cessation. Discussed the impact of stress on smoking habits and the importance of setting small goals for reduction. - Continue Chantix  for smoking cessation. - Encouraged setting small goals for smoking reduction. - Ordered low-dose CT for lung cancer screening

## 2024-09-17 NOTE — Telephone Encounter (Signed)
 Attempted call patient regarding lab results, left voicemail.  Vitamin D  level improved but still persistently low, but PTH elevated to 107.  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  May need to consider referral for endocrinology for discussion about follow-up imaging versus additional therapy.  Will continue on high-dose vitamin D  supplementation.  Kendra Nap, DO

## 2024-09-23 ENCOUNTER — Other Ambulatory Visit

## 2024-10-07 ENCOUNTER — Other Ambulatory Visit

## 2024-10-16 ENCOUNTER — Ambulatory Visit: Admitting: Pharmacist
